# Patient Record
Sex: Male | Born: 1955 | Race: White | Hispanic: No | Marital: Married | Smoking: Former smoker
Health system: Southern US, Community
[De-identification: ages and names within clinical notes are randomized; demographics above are authoritative.]

## PROBLEM LIST (undated history)

## (undated) ENCOUNTER — Emergency Department (HOSPITAL_COMMUNITY): Discharge status: Absent without leave | Payer: BLUE CROSS/BLUE SHIELD

## (undated) DIAGNOSIS — I509 Heart failure, unspecified: Secondary | ICD-10-CM

## (undated) DIAGNOSIS — F99 Mental disorder, not otherwise specified: Secondary | ICD-10-CM

## (undated) DIAGNOSIS — I10 Essential (primary) hypertension: Secondary | ICD-10-CM

## (undated) DIAGNOSIS — J189 Pneumonia, unspecified organism: Secondary | ICD-10-CM

## (undated) DIAGNOSIS — F988 Other specified behavioral and emotional disorders with onset usually occurring in childhood and adolescence: Secondary | ICD-10-CM

## (undated) DIAGNOSIS — F319 Bipolar disorder, unspecified: Secondary | ICD-10-CM

## (undated) DIAGNOSIS — G4730 Sleep apnea, unspecified: Secondary | ICD-10-CM

## (undated) DIAGNOSIS — J449 Chronic obstructive pulmonary disease, unspecified: Secondary | ICD-10-CM

## (undated) DIAGNOSIS — F419 Anxiety disorder, unspecified: Secondary | ICD-10-CM

## (undated) DIAGNOSIS — M1990 Unspecified osteoarthritis, unspecified site: Secondary | ICD-10-CM

## (undated) DIAGNOSIS — F32A Depression, unspecified: Secondary | ICD-10-CM

## (undated) DIAGNOSIS — K219 Gastro-esophageal reflux disease without esophagitis: Secondary | ICD-10-CM

## (undated) DIAGNOSIS — R0602 Shortness of breath: Secondary | ICD-10-CM

## (undated) DIAGNOSIS — F1910 Other psychoactive substance abuse, uncomplicated: Secondary | ICD-10-CM

## (undated) HISTORY — PX: TONSILLECTOMY: SUR1361

## (undated) HISTORY — PX: COLONOSCOPY: SHX174

---

## 1986-02-16 HISTORY — PX: APPENDECTOMY: SHX54

## 1998-12-10 ENCOUNTER — Ambulatory Visit (HOSPITAL_COMMUNITY): Admission: RE | Admit: 1998-12-10 | Discharge: 1998-12-10 | Payer: Self-pay | Admitting: Gastroenterology

## 1999-05-20 ENCOUNTER — Emergency Department (HOSPITAL_COMMUNITY): Admission: EM | Admit: 1999-05-20 | Discharge: 1999-05-21 | Payer: Self-pay | Admitting: Emergency Medicine

## 1999-05-20 ENCOUNTER — Encounter: Payer: Self-pay | Admitting: Emergency Medicine

## 1999-08-11 ENCOUNTER — Emergency Department (HOSPITAL_COMMUNITY): Admission: EM | Admit: 1999-08-11 | Discharge: 1999-08-11 | Payer: Self-pay | Admitting: Emergency Medicine

## 1999-08-14 ENCOUNTER — Encounter (HOSPITAL_COMMUNITY): Admission: RE | Admit: 1999-08-14 | Discharge: 1999-11-12 | Payer: Self-pay | Admitting: Emergency Medicine

## 2011-07-28 ENCOUNTER — Encounter (HOSPITAL_COMMUNITY): Payer: Self-pay | Admitting: *Deleted

## 2011-07-28 ENCOUNTER — Encounter (HOSPITAL_COMMUNITY): Payer: Self-pay

## 2011-07-28 ENCOUNTER — Inpatient Hospital Stay (HOSPITAL_COMMUNITY)
Admission: AD | Admit: 2011-07-28 | Discharge: 2011-08-01 | DRG: 897 | Disposition: A | Discharge status: Home / Self Care | Payer: 59 | Source: Ambulatory Visit | Attending: Psychiatry | Admitting: Psychiatry

## 2011-07-28 ENCOUNTER — Emergency Department (HOSPITAL_COMMUNITY)
Admission: EM | Admit: 2011-07-28 | Discharge: 2011-07-28 | Disposition: A | Discharge status: Other Acute Inpatient Hospital | Payer: 59 | Attending: Emergency Medicine | Admitting: Emergency Medicine

## 2011-07-28 DIAGNOSIS — F1010 Alcohol abuse, uncomplicated: Secondary | ICD-10-CM | POA: Diagnosis present

## 2011-07-28 DIAGNOSIS — F3130 Bipolar disorder, current episode depressed, mild or moderate severity, unspecified: Secondary | ICD-10-CM

## 2011-07-28 DIAGNOSIS — F10239 Alcohol dependence with withdrawal, unspecified: Principal | ICD-10-CM | POA: Diagnosis present

## 2011-07-28 DIAGNOSIS — F1910 Other psychoactive substance abuse, uncomplicated: Secondary | ICD-10-CM | POA: Insufficient documentation

## 2011-07-28 DIAGNOSIS — F1510 Other stimulant abuse, uncomplicated: Secondary | ICD-10-CM | POA: Diagnosis present

## 2011-07-28 DIAGNOSIS — Z7982 Long term (current) use of aspirin: Secondary | ICD-10-CM

## 2011-07-28 DIAGNOSIS — F10939 Alcohol use, unspecified with withdrawal, unspecified: Principal | ICD-10-CM | POA: Diagnosis present

## 2011-07-28 DIAGNOSIS — F609 Personality disorder, unspecified: Secondary | ICD-10-CM | POA: Diagnosis present

## 2011-07-28 DIAGNOSIS — Z79899 Other long term (current) drug therapy: Secondary | ICD-10-CM

## 2011-07-28 DIAGNOSIS — F1020 Alcohol dependence, uncomplicated: Secondary | ICD-10-CM

## 2011-07-28 DIAGNOSIS — F3132 Bipolar disorder, current episode depressed, moderate: Secondary | ICD-10-CM | POA: Diagnosis present

## 2011-07-28 DIAGNOSIS — F411 Generalized anxiety disorder: Secondary | ICD-10-CM | POA: Diagnosis present

## 2011-07-28 DIAGNOSIS — F909 Attention-deficit hyperactivity disorder, unspecified type: Secondary | ICD-10-CM | POA: Diagnosis present

## 2011-07-28 HISTORY — DX: Other psychoactive substance abuse, uncomplicated: F19.10

## 2011-07-28 HISTORY — DX: Mental disorder, not otherwise specified: F99

## 2011-07-28 HISTORY — DX: Anxiety disorder, unspecified: F41.9

## 2011-07-28 LAB — DIFFERENTIAL
Basophils Relative: 1 % (ref 0–1)
Lymphs Abs: 2.2 10*3/uL (ref 0.7–4.0)
Monocytes Relative: 6 % (ref 3–12)
Neutro Abs: 3.7 10*3/uL (ref 1.7–7.7)
Neutrophils Relative %: 56 % (ref 43–77)

## 2011-07-28 LAB — URINALYSIS, ROUTINE W REFLEX MICROSCOPIC
Bilirubin Urine: NEGATIVE
Hgb urine dipstick: NEGATIVE
Ketones, ur: NEGATIVE mg/dL
Nitrite: NEGATIVE
pH: 7 (ref 5.0–8.0)

## 2011-07-28 LAB — CBC
Hemoglobin: 14.9 g/dL (ref 13.0–17.0)
RBC: 4.65 MIL/uL (ref 4.22–5.81)
WBC: 6.5 10*3/uL (ref 4.0–10.5)

## 2011-07-28 LAB — ETHANOL: Alcohol, Ethyl (B): <11 mg/dL (ref 0–11)

## 2011-07-28 LAB — COMPREHENSIVE METABOLIC PANEL
ALT: 17 U/L (ref 0–53)
Albumin: 4.1 g/dL (ref 3.5–5.2)
Alkaline Phosphatase: 108 U/L (ref 39–117)
BUN: 8 mg/dL (ref 6–23)
Chloride: 103 mEq/L (ref 96–112)
Glucose, Bld: 90 mg/dL (ref 70–99)
Potassium: 4 mEq/L (ref 3.5–5.1)
Sodium: 140 mEq/L (ref 135–145)
Total Bilirubin: 0.4 mg/dL (ref 0.3–1.2)

## 2011-07-28 LAB — RAPID URINE DRUG SCREEN, HOSP PERFORMED
Amphetamines: NOT DETECTED
Barbiturates: NOT DETECTED
Benzodiazepines: NOT DETECTED
Cocaine: NOT DETECTED
Tetrahydrocannabinol: NOT DETECTED

## 2011-07-28 LAB — ACETAMINOPHEN LEVEL: Acetaminophen (Tylenol), Serum: <15 ug/mL (ref 10–30)

## 2011-07-28 MED ORDER — LORAZEPAM 2 MG/ML IJ SOLN: 1.0000 mg | Freq: Four times a day (QID) | INTRAMUSCULAR | Status: DC | PRN

## 2011-07-28 MED ORDER — BUPROPION HCL ER (XL) 150 MG PO TB24
150.0000 mg | ORAL_TABLET | Freq: Every day | ORAL | Status: DC
  Administered 2011-07-28: 150 mg via ORAL
  Filled 2011-07-28: qty 1

## 2011-07-28 MED ORDER — LORAZEPAM 1 MG PO TABS: 1.0000 mg | ORAL_TABLET | Freq: Four times a day (QID) | ORAL | Status: DC | PRN

## 2011-07-28 MED ORDER — LOPERAMIDE HCL 2 MG PO CAPS: 2.0000 mg | ORAL_CAPSULE | ORAL | Status: AC | PRN

## 2011-07-28 MED ORDER — ACETAMINOPHEN 325 MG PO TABS: 650.0000 mg | ORAL_TABLET | Freq: Four times a day (QID) | ORAL | Status: DC | PRN

## 2011-07-28 MED ORDER — MAGNESIUM HYDROXIDE 400 MG/5ML PO SUSP: 30.0000 mL | Freq: Every day | ORAL | Status: DC | PRN

## 2011-07-28 MED ORDER — CHLORDIAZEPOXIDE HCL 25 MG PO CAPS
25.0000 mg | ORAL_CAPSULE | Freq: Once | ORAL | Status: AC
  Administered 2011-07-28: 25 mg via ORAL
  Filled 2011-07-28: qty 1

## 2011-07-28 MED ORDER — ALUM & MAG HYDROXIDE-SIMETH 200-200-20 MG/5ML PO SUSP: 30.0000 mL | ORAL | Status: DC | PRN

## 2011-07-28 MED ORDER — ONDANSETRON 4 MG PO TBDP: 4.0000 mg | ORAL_TABLET | Freq: Four times a day (QID) | ORAL | Status: AC | PRN

## 2011-07-28 MED ORDER — VITAMIN B-1 100 MG PO TABS
100.0000 mg | ORAL_TABLET | Freq: Every day | ORAL | Status: DC
  Filled 2011-07-28: qty 1

## 2011-07-28 MED ORDER — CHLORDIAZEPOXIDE HCL 25 MG PO CAPS
25.0000 mg | ORAL_CAPSULE | ORAL | Status: AC
  Administered 2011-07-31 (×2): 25 mg via ORAL
  Filled 2011-07-28 (×2): qty 1

## 2011-07-28 MED ORDER — CHLORDIAZEPOXIDE HCL 25 MG PO CAPS
25.0000 mg | ORAL_CAPSULE | Freq: Four times a day (QID) | ORAL | Status: AC | PRN
  Administered 2011-07-29: 25 mg via ORAL
  Filled 2011-07-28: qty 1

## 2011-07-28 MED ORDER — LORAZEPAM 1 MG PO TABS
2.0000 mg | ORAL_TABLET | Freq: Once | ORAL | Status: AC
  Administered 2011-07-28: 2 mg via ORAL
  Filled 2011-07-28: qty 1

## 2011-07-28 MED ORDER — LORAZEPAM 1 MG PO TABS
0.0000 mg | ORAL_TABLET | Freq: Four times a day (QID) | ORAL | Status: DC
  Administered 2011-07-28 (×2): 2 mg via ORAL
  Filled 2011-07-28 (×2): qty 2

## 2011-07-28 MED ORDER — CHLORDIAZEPOXIDE HCL 25 MG PO CAPS
25.0000 mg | ORAL_CAPSULE | Freq: Every day | ORAL | Status: AC
  Administered 2011-08-01: 25 mg via ORAL
  Filled 2011-07-28: qty 1

## 2011-07-28 MED ORDER — THIAMINE HCL 100 MG/ML IJ SOLN: 100.0000 mg | Freq: Every day | INTRAMUSCULAR | Status: DC

## 2011-07-28 MED ORDER — LAMOTRIGINE 150 MG PO TABS
150.0000 mg | ORAL_TABLET | Freq: Two times a day (BID) | ORAL | Status: DC
  Administered 2011-07-28: 150 mg via ORAL
  Filled 2011-07-28 (×2): qty 1

## 2011-07-28 MED ORDER — THIAMINE HCL 100 MG/ML IJ SOLN: 100.0000 mg | Freq: Once | INTRAMUSCULAR | Status: DC

## 2011-07-28 MED ORDER — VITAMIN B-1 100 MG PO TABS
100.0000 mg | ORAL_TABLET | Freq: Every day | ORAL | Status: DC
  Administered 2011-07-29 – 2011-08-01 (×4): 100 mg via ORAL
  Filled 2011-07-28 (×6): qty 1

## 2011-07-28 MED ORDER — ARIPIPRAZOLE 15 MG PO TABS
15.0000 mg | ORAL_TABLET | Freq: Every day | ORAL | Status: DC
  Administered 2011-07-28: 15 mg via ORAL
  Filled 2011-07-28 (×2): qty 1

## 2011-07-28 MED ORDER — CHLORDIAZEPOXIDE HCL 25 MG PO CAPS
25.0000 mg | ORAL_CAPSULE | Freq: Four times a day (QID) | ORAL | Status: AC
  Administered 2011-07-28 – 2011-07-29 (×4): 25 mg via ORAL
  Filled 2011-07-28 (×4): qty 1

## 2011-07-28 MED ORDER — FOLIC ACID 1 MG PO TABS
1.0000 mg | ORAL_TABLET | Freq: Every day | ORAL | Status: DC
  Administered 2011-07-28: 1 mg via ORAL
  Filled 2011-07-28: qty 1

## 2011-07-28 MED ORDER — ADULT MULTIVITAMIN W/MINERALS CH
1.0000 | ORAL_TABLET | Freq: Every day | ORAL | Status: DC
  Administered 2011-07-28: 1 via ORAL
  Filled 2011-07-28: qty 1

## 2011-07-28 MED ORDER — HYDROXYZINE HCL 25 MG PO TABS
25.0000 mg | ORAL_TABLET | Freq: Four times a day (QID) | ORAL | Status: AC | PRN
  Administered 2011-07-29 – 2011-07-30 (×3): 25 mg via ORAL
  Filled 2011-07-28: qty 1

## 2011-07-28 MED ORDER — ASPIRIN EC 81 MG PO TBEC
81.0000 mg | DELAYED_RELEASE_TABLET | Freq: Every day | ORAL | Status: DC
Start: 2011-07-28 — End: 2011-07-28
  Administered 2011-07-28: 81 mg via ORAL
  Filled 2011-07-28: qty 1

## 2011-07-28 MED ORDER — ADULT MULTIVITAMIN W/MINERALS CH
1.0000 | ORAL_TABLET | Freq: Every day | ORAL | Status: DC
  Administered 2011-07-29 – 2011-08-01 (×4): 1 via ORAL
  Filled 2011-07-28 (×6): qty 1

## 2011-07-28 MED ORDER — CHLORDIAZEPOXIDE HCL 25 MG PO CAPS
25.0000 mg | ORAL_CAPSULE | Freq: Three times a day (TID) | ORAL | Status: AC
  Administered 2011-07-30 (×3): 25 mg via ORAL
  Filled 2011-07-28 (×3): qty 1

## 2011-07-28 MED ORDER — TRAZODONE HCL 100 MG PO TABS
100.0000 mg | ORAL_TABLET | Freq: Every evening | ORAL | Status: DC | PRN
  Administered 2011-07-28 – 2011-07-31 (×4): 100 mg via ORAL
  Filled 2011-07-28 (×4): qty 1

## 2011-07-28 MED ORDER — LORAZEPAM 1 MG PO TABS
0.0000 mg | ORAL_TABLET | Freq: Two times a day (BID) | ORAL | Status: DC
  Administered 2011-07-28: 2 mg via ORAL

## 2011-07-28 NOTE — Tx Team (Signed)
Initial Interdisciplinary Treatment Plan  PATIENT STRENGTHS: (choose at least two) Ability for insight Capable of independent living Communication skills Financial means General fund of knowledge Supportive family/friends  PATIENT STRESSORS: Marital or family conflict Occupational concerns Substance abuse   PROBLEM LIST: Problem List/Patient Goals Date to be addressed Date deferred Reason deferred Estimated date of resolution  anxiety      Alcohol abuse                                                 DISCHARGE CRITERIA:  Improved stabilization in mood, thinking, and/or behavior Motivation to continue treatment in a less acute level of care Need for constant or close observation no longer present Verbal commitment to aftercare and medication compliance  PRELIMINARY DISCHARGE PLAN: Attend aftercare/continuing care group Attend 12-step recovery group  PATIENT/FAMIILY INVOLVEMENT: This treatment plan has been presented to and reviewed with the patient, [name], and/or family member, .  The patient and family have been given the opportunity to ask questions and make suggestions.  Noah Charon 07/28/2011, 9:58 PM

## 2011-07-28 NOTE — ED Notes (Signed)
MD at bedside. 

## 2011-07-28 NOTE — ED Notes (Signed)
MD informed of CIWA score/BP-ok to give another 2mg  of Ativan and reassess in 30-45 minutes-will continue to monitor

## 2011-07-28 NOTE — ED Notes (Signed)
Pt is dressed in blue scrubs

## 2011-07-28 NOTE — ED Provider Notes (Signed)
History     CSN: [id number]  Arrival date & time 07/28/11  0725   First MD Initiated Contact with Patient 07/28/11 [phone]      Chief Complaint  Patient presents with  . Medical Clearance    detox     The history is provided by the patient and the spouse.   patient reports a long-standing history stimulant use of his ADHD meds.  He reports is also been drinking alcohol for the last several months in a way to cope with the increased stimulant effects of his medications and his substance abuse.  He reports a long-standing history of alcohol abuse with alcohol detox in 1990s.  He's been sober for a very long time until last several months.  His last drink was last night.  He's been drinking Dr.  Denies homicidal or suicidal thoughts.  He has no other medical complaints.  His symptoms are mild to moderate.  He states he feels slightly anxious at this time.  At times he says "I'm freaking out"  Past Medical History  Diagnosis Date  . Substance abuse     Past Surgical History  Procedure Date  . Appendectomy     No family history on file.  History  Substance Use Topics  . Smoking status: Never Smoker   . Smokeless tobacco: Not on file  . Alcohol Use: Yes     pint a day      Review of Systems  All other systems reviewed and are negative.    Allergies  Review of patient's allergies indicates no known allergies.  Home Medications   Current Outpatient Rx  Name Route Sig Dispense Refill  . ARIPIPRAZOLE 15 MG PO TABS Oral Take 15 mg by mouth daily.    . ASPIRIN EC 81 MG PO TBEC Oral Take 81 mg by mouth daily.    . BUPROPION HCL ER (XL) 150 MG PO TB24 Oral Take 150 mg by mouth daily.    Marland Kitchen GABAPENTIN 100 MG PO CAPS Oral Take 200 mg by mouth daily as needed.    Marland Kitchen LAMOTRIGINE 150 MG PO TABS Oral Take 150 mg by mouth 2 (two) times daily.    . METHYLPHENIDATE HCL ER 54 MG PO TBCR Oral Take 54 mg by mouth every morning.      BP 139/106  Pulse 93  Temp(Src) 97.5 F (36.4 C)  (Oral)  Resp 18  SpO2 97%  Physical Exam  Nursing note and vitals reviewed. Constitutional: He is oriented to person, place, and time. He appears well-developed and well-nourished.  HENT:  Head: Normocephalic and atraumatic.  Eyes: EOM are normal.  Neck: Normal range of motion.  Cardiovascular: Normal rate, regular rhythm, normal heart sounds and intact distal pulses.   Pulmonary/Chest: Effort normal and breath sounds normal. No respiratory distress.  Abdominal: Soft. He exhibits no distension. There is no tenderness.  Musculoskeletal: Normal range of motion.  Neurological: He is alert and oriented to person, place, and time.  Skin: Skin is warm and dry.  Psychiatric: His speech is normal. Judgment and thought content normal. Thought content is not paranoid. Cognition and memory are normal. He exhibits a depressed mood. He expresses no homicidal and no suicidal ideation.    ED Course  Procedures (including critical care time)   Labs Reviewed  CBC  DIFFERENTIAL  URINALYSIS, ROUTINE W REFLEX MICROSCOPIC  ETHANOL  COMPREHENSIVE METABOLIC PANEL  ACETAMINOPHEN LEVEL  URINE RAPID DRUG SCREEN (HOSP PERFORMED)   No results found.  1. ETOH abuse 2. Stimulant Abuse   MDM  Spoke with [facility] who will evaluate. No ADHD meds in ER secondary to abuse. Placed on home meds. ETOH withdrawal protocol        Lyanne Co, MD 07/28/11 [phone]

## 2011-07-28 NOTE — ED Provider Notes (Signed)
BP 156/93  Pulse 84  Temp(Src) 98.1 F (36.7 C) (Oral)  Resp 14  SpO2 98%  Accepted for transfer [facility].  Forbes Cellar, MD 07/28/11 1944

## 2011-07-28 NOTE — ED Notes (Signed)
Pt denies si/hi. Pt asked wife to step out room while answering questions. Pt states he can answer question better without spouse

## 2011-07-28 NOTE — ED Notes (Signed)
Pt states he has a lot of guilt related ETOH abuse-states he has been dealing with issue since he was a child-was sober for 20 years-drinking daily for 3 months or more-has not been involved in AA "in years"-increased stress at work and health of wife resulted in relapse-pt anxious, tearful at times-will continue to monitor

## 2011-07-28 NOTE — Consult Note (Signed)
Reason for Consult: Bipolar disorder, Depression, alcohol abuse, benzodiazepines abuse, methylphenidate abuse Referring Physician: Dr. Lisbeth Renshaw is an 56 y.o. male.  HPI: Patient was seen and chart reviewed. Patient was brought in by his family for mental decompensation with the symptoms of for depression, isolation, withdrawal, decreased the social functioning, unable to function in his work and drinking. The alcohol, probably walk up 1/5 daily in the evening. For the last 2-6 months, possibility of for snorting, and clonazepam and methylphenidate. Patient has history of for alcohol dependence and was sober since 65. Patient reportedly has the rough childhood. Patient has a 2 children, ages 66 and 8 who are in college. 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 Patient reported he used to have a poor work as for him but now only has 3 workers. Patient has been receiving outpatient psychiatric services from Valinda Hoar, nurse practitioner at [facility]. Reportedly patient has been changed psychiatric clinic. Patient urine drug screen was negative for drug of abuse is a blood alcohol level was not significant. Patient has a limited insight, judgment and impulse control.  Past Medical History  Diagnosis Date  . Substance abuse   . Anxiety     Past Surgical History  Procedure Date  . Appendectomy     No family history on file.  Social History:  reports that he has never smoked. He does not have any smokeless tobacco history on file. He reports that he drinks alcohol. He reports that he does not use illicit drugs.  Allergies: No Known Allergies  Medications: I have reviewed the patient's current medications.  Results for orders placed during the hospital encounter of 07/28/11 (from the past 48 hour(s))  ETHANOL     Status:  Normal   Collection Time   07/28/11  7:51 AM      Component Value Range Comment   Alcohol, Ethyl (B) <11  0 - 11 (mg/dL)   CBC     Status: Normal   Collection Time   07/28/11  7:51 AM      Component Value Range Comment   WBC 6.5  4.0 - 10.5 (K/uL)    RBC 4.65  4.22 - 5.81 (MIL/uL)    Hemoglobin 14.9  13.0 - 17.0 (g/dL)    HCT 96.0  45.4 - 09.8 (%)    MCV 91.8  78.0 - 100.0 (fL)    MCH 32.0  26.0 - 34.0 (pg)    MCHC 34.9  30.0 - 36.0 (g/dL)    RDW 11.9  14.7 - 82.9 (%)    Platelets 227  150 - 400 (K/uL)   DIFFERENTIAL     Status: Normal   Collection Time   07/28/11  7:51 AM      Component Value Range Comment   Neutrophils Relative 56  43 - 77 (%)    Neutro Abs 3.7  1.7 - 7.7 (K/uL)    Lymphocytes Relative 34  12 - 46 (%)    Lymphs Abs 2.2  0.7 - 4.0 (K/uL)    Monocytes Relative 6  3 - 12 (%)    Monocytes Absolute 0.4  0.1 - 1.0 (K/uL)    Eosinophils Relative 3  0 - 5 (%)    Eosinophils Absolute 0.2  0.0 - 0.7 (K/uL)    Basophils Relative 1  0 - 1 (%)  Basophils Absolute 0.1  0.0 - 0.1 (K/uL)   COMPREHENSIVE METABOLIC PANEL     Status: Normal   Collection Time   07/28/11  7:51 AM      Component Value Range Comment   Sodium 140  135 - 145 (mEq/L)    Potassium 4.0  3.5 - 5.1 (mEq/L)    Chloride 103  96 - 112 (mEq/L)    CO2 27  19 - 32 (mEq/L)    Glucose, Bld 90  70 - 99 (mg/dL)    BUN 8  6 - 23 (mg/dL)    Creatinine, Ser 9.14  0.50 - 1.35 (mg/dL)    Calcium 9.1  8.4 - 10.5 (mg/dL)    Total Protein 7.0  6.0 - 8.3 (g/dL)    Albumin 4.1  3.5 - 5.2 (g/dL)    AST 19  0 - 37 (U/L)    ALT 17  0 - 53 (U/L)    Alkaline Phosphatase 108  39 - 117 (U/L)    Total Bilirubin 0.4  0.3 - 1.2 (mg/dL)    GFR calc non Af Amer >90  >90 (mL/min)    GFR calc Af Amer >90  >90 (mL/min)   ACETAMINOPHEN LEVEL     Status: Normal   Collection Time   07/28/11  7:51 AM      Component Value Range Comment   Acetaminophen (Tylenol), Serum <15.0  10 - 30 (ug/mL)   URINALYSIS, ROUTINE W REFLEX  MICROSCOPIC     Status: Normal   Collection Time   07/28/11  8:06 AM      Component Value Range Comment   Color, Urine YELLOW  YELLOW     APPearance CLEAR  CLEAR     Specific Gravity, Urine 1.017  1.005 - 1.030     pH 7.0  5.0 - 8.0     Glucose, UA NEGATIVE  NEGATIVE (mg/dL)    Hgb urine dipstick NEGATIVE  NEGATIVE     Bilirubin Urine NEGATIVE  NEGATIVE     Ketones, ur NEGATIVE  NEGATIVE (mg/dL)    Protein, ur NEGATIVE  NEGATIVE (mg/dL)    Urobilinogen, UA 1.0  0.0 - 1.0 (mg/dL)    Nitrite NEGATIVE  NEGATIVE     Leukocytes, UA NEGATIVE  NEGATIVE  MICROSCOPIC NOT DONE ON URINES WITH NEGATIVE PROTEIN, BLOOD, LEUKOCYTES, NITRITE, OR GLUCOSE <1000 mg/dL.  URINE RAPID DRUG SCREEN (HOSP PERFORMED)     Status: Normal   Collection Time   07/28/11  8:06 AM      Component Value Range Comment   Opiates NONE DETECTED  NONE DETECTED     Cocaine NONE DETECTED  NONE DETECTED     Benzodiazepines NONE DETECTED  NONE DETECTED     Amphetamines NONE DETECTED  NONE DETECTED     Tetrahydrocannabinol NONE DETECTED  NONE DETECTED     Barbiturates NONE DETECTED  NONE DETECTED      No results found.  No psychosis and Positive for anxiety, bad mood, depression, excessive alcohol consumption, illegal drug usage and sleep disturbance Blood pressure 147/94, pulse 101, temperature 98.1 F (36.7 C), temperature source Oral, resp. rate 14, SpO2 97.00%.   Assessment/Plan: Bipolar disorder, most recent episode depression Attention deficit hyperactivity disorder. Alcohol dependence and recent relapse with alcohol abuse. Rule out polysubstance abuse including clonazepam and the methylphenidate.  Recommended acute psychiatric hospitalization for safety and stabilization and possibly on medication for bipolar disorder and the alcohol detox treatment.  Cleatus Goodin,JANARDHAHA R. 07/28/2011, 5:36 PM

## 2011-07-28 NOTE — ED Notes (Signed)
Pt states he want detox of alcohol. Pt states he drinks about a pint of vocka. Pt states he has also been taking his concerta for his adhd. Pt states his last drink was last night. Pt states he has been sober and relapsed recently

## 2011-07-28 NOTE — BH Assessment (Signed)
Assessment Note   [name] is an 56 y.o. male. Patient presents to [facility].  He reports drinking alcohol for the last 6-7 months in a way to cope with the increased stimulant effects of his medications (referring to ADHD medications) and his substance abuse. He reports a long-standing history of stimulant use due to taking ADHD meds. He denies overuse of medications stating he only takes as prescribed. His last drink was last night . He's been sober since 1989 relapsing 6-7 months ago. He reports a long-standing history of alcohol abuse sobriety. He [facility] detox at [facility] in 1989. He reports an increase in anxiety and depression triggered by his relapse 6-7 months ago. Patient reports tremors and anxiety as his only withdrawal symptom.  Denies suicidal and homicidal thoughts. No psychotic symptoms noted. He has no other medical complaints. His symptoms are mild to moderate.    Axis I: Alcohol Dependence; Anxiety Disorder Nos, Mood Disorder Nos, ADHD Axis II: Deferred Axis III:  Past Medical History  Diagnosis Date  . Substance abuse   . Anxiety    Axis IV: other psychosocial or environmental problems and problems related to social environment Axis V: 41-50 serious symptoms  Past Medical History:  Past Medical History  Diagnosis Date  . Substance abuse   . Anxiety     Past Surgical History  Procedure Date  . Appendectomy     Family History: No family history on file.  Social History:  reports that he has never smoked. He does not have any smokeless tobacco history on file. He reports that he drinks alcohol. He reports that he does not use illicit drugs.  Additional Social History:  Alcohol / Drug Use Pain Medications: see listed meds noted by [facility] staff  Prescriptions: see listed meds noted by [facility] staff Over the Counter: see listed meds noted by [facility] staff History of alcohol / drug use?: Yes Substance #1 Name of Substance 1:  Alcohol (Vodka) 1 - Age of First Use: teens 1 - Amount (size/oz): "a little more than a pint" 1 - Frequency: daily for the past 6-7 months 1 - Duration: on-going x6-7 months 1 - Last Use / Amount: 07/27/2011 "last night";sts he drank 1 bottle of vodka   CIWA: CIWA-Ar BP: 147/94 mmHg Pulse Rate: 101  Nausea and Vomiting: mild nausea with no vomiting Tactile Disturbances: very mild itching, pins and needles, burning or numbness Tremor: not visible, but can be felt fingertip to fingertip Auditory Disturbances: not present Paroxysmal Sweats: barely perceptible sweating, palms moist Visual Disturbances: not present Anxiety: three Headache, Fullness in Head: mild Agitation: two Orientation and Clouding of Sensorium: oriented and can do serial additions CIWA-Ar Total: 13  COWS:    Allergies: No Known Allergies  Home Medications:  (Not in a hospital admission)  OB/GYN Status:  No LMP for male patient.  General Assessment Data Location of Assessment: [facility] Living Arrangements: Other (Comment);Spouse/significant other (lives with spouse and adult son ) Can pt return to current living arrangement?: Yes Admission Status: Voluntary Is patient capable of signing voluntary admission?: Yes Transfer from: Acute Hospital Referral Source: Self/Family/Friend  Education Status Is patient currently in school?: No  Risk to self Suicidal Ideation: No Suicidal Intent: No Is patient at risk for suicide?: No Suicidal Plan?: No Access to Means: No Specify Access to Suicidal Means:  (n/a; no acces to means) What has been your use of drugs/alcohol within the last 12 months?:  (Alcohol) Previous  Attempts/Gestures: No How many times?:  (0) Other Self Harm Risks:  (n/a) Triggers for Past Attempts: Other (Comment) (no previous attempts ) Intentional Self Injurious Behavior: None Family Suicide History: Yes (Brother, Sister, Midwife. Family members-drug) Recent stressful life  event(s): Other (Comment) (increase in anxiety; relapsed on alcohol 6-7 mo's ago) Persecutory voices/beliefs?: No Depression: Yes Depression Symptoms: Loss of interest in usual pleasures;Feeling angry/irritable;Despondent Substance abuse history and/or treatment for substance abuse?: Yes Suicide prevention information given to non-admitted patients: Not applicable  Risk to Others Homicidal Ideation: No Thoughts of Harm to Others: No Current Homicidal Intent: No Current Homicidal Plan: No Access to Homicidal Means: No Identified Victim:  (n/a) History of harm to others?: No Assessment of Violence: None Noted Violent Behavior Description:  (pt currently calm and cooperative) Does patient have access to weapons?: No Criminal Charges Pending?: No Does patient have a court date: No  Psychosis Hallucinations: None noted Delusions: None noted  Mental Status Report Appear/Hygiene: Other (Comment) Eye Contact: Fair Motor Activity: Agitation Speech: Logical/coherent Level of Consciousness: Alert Mood: Depressed;Anxious Affect: Appropriate to circumstance Anxiety Level: Panic Attacks Panic attack frequency:  (daily) Most recent panic attack:  (07/28/2011; sts he had a panic attack this morning)  Cognitive Functioning Concentration: Normal Memory: Recent Intact;Remote Intact IQ: Average Insight: Fair Impulse Control: Fair Appetite: Good Weight Loss:  (0) Weight Gain:  (0) Sleep: Decreased Total Hours of Sleep:  (approx. 6hrs)  ADLScreening [facility] Assessment Services) Patient's cognitive ability adequate to safely complete daily activities?: Yes Patient able to express need for assistance with ADLs?: Yes Independently performs ADLs?: Yes  Abuse/Neglect [facility]) Physical Abuse: Denies Verbal Abuse: Denies Sexual Abuse: Denies  Prior Inpatient Therapy Prior Inpatient Therapy: Yes Prior Therapy Dates:  (1989) Prior Therapy Facilty/Provider(s):  [facility]) Reason for Treatment:   (substance abuse (alcohol and thc))  Prior Outpatient Therapy Prior Outpatient Therapy: No Prior Therapy Dates:  (n/a) Prior Therapy Facilty/Provider(s):  (n/a) Reason for Treatment:  (n/a)  ADL Screening (condition at time of admission) Patient's cognitive ability adequate to safely complete daily activities?: Yes Patient able to express need for assistance with ADLs?: Yes Independently performs ADLs?: Yes Weakness of Legs: None Weakness of Arms/Hands: None  Home Assistive Devices/Equipment Home Assistive Devices/Equipment: None    Abuse/Neglect Assessment (Assessment to be complete while patient is alone) Physical Abuse: Denies Verbal Abuse: Denies Sexual Abuse: Denies Exploitation of patient/patient's resources: Denies Self-Neglect: Denies Values / Beliefs Cultural Requests During Hospitalization: None Spiritual Requests During Hospitalization: None   Advance Directives (For Healthcare) Advance Directive: Patient does not have advance directive Nutrition Screen Diet: Regular Unintentional weight loss greater than 10lbs within the last month: No Problems chewing or swallowing foods and/or liquids: No Home Tube Feeding or Total Parenteral Nutrition (TPN): No Patient appears severely malnourished: No  Additional Information 1:1 In Past 12 Months?: No CIRT Risk: No Elopement Risk: No Does patient have medical clearance?: Yes     Disposition:  Disposition Disposition of Patient: Inpatient treatment program;Referred to (Patient requesting in-pt detox @ [facility]. Referred to [facility]. )  On Site Evaluation by:   Reviewed with Physician:     Melynda Ripple [facility] 07/28/2011 3:14 PM

## 2011-07-29 DIAGNOSIS — F1010 Alcohol abuse, uncomplicated: Secondary | ICD-10-CM | POA: Diagnosis present

## 2011-07-29 DIAGNOSIS — F3132 Bipolar disorder, current episode depressed, moderate: Secondary | ICD-10-CM | POA: Diagnosis present

## 2011-07-29 DIAGNOSIS — F1020 Alcohol dependence, uncomplicated: Secondary | ICD-10-CM | POA: Diagnosis present

## 2011-07-29 MED ORDER — ARIPIPRAZOLE 15 MG PO TABS
15.0000 mg | ORAL_TABLET | Freq: Every day | ORAL | Status: DC
  Administered 2011-07-29 – 2011-08-01 (×4): 15 mg via ORAL
  Filled 2011-07-29 (×7): qty 1

## 2011-07-29 MED ORDER — BUPROPION HCL ER (XL) 150 MG PO TB24
150.0000 mg | ORAL_TABLET | Freq: Every day | ORAL | Status: DC
  Administered 2011-07-29 – 2011-07-30 (×2): 150 mg via ORAL
  Filled 2011-07-29 (×4): qty 1

## 2011-07-29 MED ORDER — GABAPENTIN 100 MG PO CAPS: 200.0000 mg | ORAL_CAPSULE | Freq: Every day | ORAL | Status: DC

## 2011-07-29 MED ORDER — ASPIRIN EC 81 MG PO TBEC
81.0000 mg | DELAYED_RELEASE_TABLET | Freq: Every day | ORAL | Status: DC
  Administered 2011-07-29 – 2011-08-01 (×4): 81 mg via ORAL
  Filled 2011-07-29 (×6): qty 1

## 2011-07-29 MED ORDER — LAMOTRIGINE 100 MG PO TABS
150.0000 mg | ORAL_TABLET | Freq: Two times a day (BID) | ORAL | Status: DC
  Administered 2011-07-29 – 2011-08-01 (×7): 150 mg via ORAL
  Filled 2011-07-29 (×11): qty 1.5

## 2011-07-29 NOTE — Progress Notes (Signed)
Patient ID: SEVEN DOLLENS, male   DOB: 27-Nov-1955, 56 y.o.   MRN: [id number] He has been up and to most of the groups has poor interaction. "York Spaniel that he has panic attacks when around people"  He denies having withdrawal symptoms. Was given PRN for anxiety this Pm,
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
## 2011-07-29 NOTE — Progress Notes (Signed)
Patient ID: [name], male   DOB: 1955/09/05, 56 y.o.   MRN: [id number] Patient depressed and blunted tonight.  Patient complains of panic attacks which prn medication was given to help with anxiety.  Patient denies SI/HI and AVH.  When patient speaks about why he is at [facility] he becomes tearful.  Patient rates depression high at 7/10.  Patient offered encouragement and support.  Staff to monitor Q12mins for safety.

## 2011-07-29 NOTE — Progress Notes (Signed)
Patient pleasant and cooperative during admission. Patient states "I am bipolar and feeling increasingly depressed." Patient admits he has been abusing alcohol for approx 6 months. Patient states he drinks wine, beer and "all types of alcohol." Patient not forthcoming with staff about type/amount of alcohol used per day. Patient denies drug use. Patient denies tobacco use. 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 Patient denies SI/HI, denies A/V hallucinations. Patient endorses feeling anxious upon admission. Patient oriented to unit, oriented to unit policies. Patient denies any questions/concerns at this time. Patient safe on unit with Q15 minute checks for safety. Will continue to monitor.

## 2011-07-29 NOTE — Progress Notes (Signed)
[facility] Group Notes:  (Counselor/Nursing/MHT/Case Management/Adjunct)  07/29/2011 5:39 PM  Type of Therapy:  Group Therapy at 11  Participation Level:  Minimal  Participation Quality:  Attentive and Sharing  Affect:  Depressed and tearful  Cognitive:  Appropriate  Insight:  Good  Engagement in Group:  Good  Engagement in Therapy:  Limited  Modes of Intervention:  Clarification, Orientation, Socialization and Support  Summary of Progress/Problems:  The focus of this group session was to process how we deal with difficult emotions and share with others the patterns that play out when we are reacting to the emotion verses the situation.  [name] shared that the most difficult emotions he deals with are stress and depression.  He also shared with group that depressive episode and relapse on alcohol contributed to his admission.  [name] was also out of group for a period to meet with physician. [name] quoted Engineer, technical sales "Unhappy families are unhappy in similar ways but happy families are happy in many different ways."     [name] 07/29/2011, 5:39 PM  [facility] Group Notes:  (Counselor/Nursing/MHT/Case Management/Adjunct)  07/29/2011 5:42 PM  Type of Therapy:  Group Therapy at 1:15  Participation Level:  Minimal  Participation Quality:  Drowsy  Affect:  Depressed  Cognitive:  Oriented  Insight:  Limited sharing as he was asleep for portion of group  Engagement in Group:  Limited  Engagement in Therapy:  Limited  Modes of Intervention:  Limit-setting, Orientation, Support and Exploration  Summary of Progress/Problems: Group focus was on mindfulness and behaviors that promote mindfulness, including brief activity with Hershey candy. Members were given a Hershey kiss as they came into group and after discussion were led to eat another in experiential process.  [name] was awake for both portions of candy exercise yet was asleep during much of discussion.  [name] was responsive to  being awoken and was open to some teasing for snoring.  He shared in closing that he is looking forward to spending time with his children and exercising as he recently put on some weight and rest of family is very fit.      [name] 07/29/2011, 5:42 PM

## 2011-07-29 NOTE — Discharge Planning (Signed)
New patient attended AM group.  States he is here for depression and alcohol abuse.  Sober for 20 years or so and relapsed in the last year.  Factors of depression include business doing poorly and adult children leaving home.  Choked up several times as he was talking, but able to regain composure.  States he has been diagnosed with bipolar d/o as is taking meds for such.  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  She is supportive, worried and wants him to get the help he needs.
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
## 2011-07-30 MED ORDER — BUPROPION HCL ER (XL) 300 MG PO TB24
300.0000 mg | ORAL_TABLET | Freq: Every day | ORAL | Status: DC
Start: 2011-07-31 — End: 2011-08-01
  Administered 2011-07-31 – 2011-08-01 (×2): 300 mg via ORAL
  Filled 2011-07-30 (×5): qty 1

## 2011-07-30 NOTE — Progress Notes (Signed)
[facility] Group Notes:  (Counselor/Nursing/MHT/Case Management/Adjunct)  07/30/2011 11:15 PM  Type of Therapy:  Group Therapy at 11  Participation Level:  Active  Participation Quality:  Appropriate  Affect:  Depressed and Flat and tearful  Cognitive:  Alert and Oriented  Insight:  Limited  Engagement in Group:  Good  Engagement in Therapy:  Limited  Modes of Intervention:  Clarification and Support  Summary of Progress/Problems:  Focus of group processing discussion was on balance in life; the components in life which have a negative influence on balance and the components which make for a more balanced life. [name] shared  stressors which included "not living up to others expectations, providing financially for entire family and mental health issues." [name] also mentioned briefly "having work and having purpose" as a positive component to a balanced life. [name] continues to have periods of tearfulness during group.   [facility] Group Notes:  (Counselor/Nursing/MHT/Case Management/Adjunct)  07/30/2011 11:24 PM  Type of Therapy:  Group Therapy at 1:15  Participation Level:  Active  Participation Quality:  Appropriate, Attentive and Sharing  Affect:  Depressed and Flat  Cognitive:  Appropriate  Insight:  Good  Engagement in Group:  Good  Engagement in Therapy:  Good  Modes of Intervention:  Clarification and Support  Summary of Progress/Problems: participated in activity in which patients choose photographs to represent what their life would look and feel like were it in balance and another for out of balance. [name] chose a photograph of a statue covered in cobwebs to represent what it feels like when my depression takes over and a photo of children outside running with balloons to represent balance.  "These children represent an innate joy and innocence that we lose as adults." When asked where [name] might find some joy he shared about his children and how he misses them as they have either  moved out on their own or are attending college. He became tearful after sharing.    Clide Dales 07/30/2011, 11:24 PM

## 2011-07-30 NOTE — Progress Notes (Signed)
Patient ID: [name], male   DOB: 1955-05-21, 56 y.o.   MRN: [id number] Pt is awake and active on the unit this AM. Pt denies SI/HI and A/V hallucinations. Pt is participating in the milieu and is cooperative with staff. Pt forwards little in conversation but is attending groups. Pt expressed concern about medication regimen stating that he was abusing his Concerta. MD addressed this with the pt and made adjustments as needed. Pt is satisfied with those changes. Writer will continue to monitor.

## 2011-07-30 NOTE — Treatment Plan (Signed)
Interdisciplinary Treatment Plan Update (Adult)  Date: 07/30/2011  Time Reviewed: 9:28 AM   Progress in Treatment: Attending groups: Yes Participating in groups: Yes Taking medication as prescribed: Yes Tolerating medication: Yes   Family/Significant other contact made:  Yes Patient understands diagnosis:  Yes  As evidenced by asking for help with substance abuse, depression Discussing patient identified problems/goals with staff:  Yes  See below Medical problems stabilized or resolved:  Yes Denies suicidal/homicidal ideation: Yes In tx team Issues/concerns per patient self-inventory:  Yes  Depression 4, hopelessness 5  Panic attacks Other:  New problem(s) identified: N/A  Reason for Continuation of Hospitalization: Depression Medication stabilization Withdrawal symptoms  Interventions implemented related to continuation of hospitalization: Increase wellbutrin, librium taper, encourage group attendance and participation  Additional comments:  Estimated length of stay: 1-2 days  Discharge Plan: Return home, follow up outpt  New goal(s): N/A  Review of initial/current patient goals per problem list:   1.  Goal(s): Safely detox from alcohol  Met:  No  Target date:6/15  As evidenced [id number] vitals, no withdrawal symptoms  2.  Goal (s): Decrease depression  Met:  No  Target date:6/15  As evidenced [id number] will rate depression at 3 or less  3.  Goal(s): Identify comprehensive sobriety plan  Met:  No  Target date:6/15  As evidenced by: self report  4.  Goal(s):  Met:  No  Target date:  As evidenced by:  Attendees: Patient:  [name] 07/30/2011 9:28 AM  Family:     Physician:  Lupe Carney 07/30/2011 9:28 AM   Nursing:  Angela Cox Dax  07/30/2011 9:28 AM   Case Manager:  Richelle Ito, LCSW 07/30/2011 9:28 AM   Counselor:  Ronda Fairly, LCSWA 07/30/2011 9:28 AM   Other:     Other:     Other:     Other:      Scribe for Treatment Team:     Ida Rogue, 07/30/2011 9:28 AM

## 2011-07-30 NOTE — Progress Notes (Signed)
Pt. Has a flat affect, depressed mood.  Pt. Attended Karaoke. Reviewed medications with pt.  Support given

## 2011-07-30 NOTE — Progress Notes (Signed)
[facility] MD Progress Note  07/30/2011 4:24 PM  S/O: Patient seen and evaluated. Chart reviewed. Patient stated that his mood was "little better". His affect was mood congruent and brighter. He denied any current thoughts of self injurious behavior, suicidal ideation or homicidal ideation. There were no auditory or visual hallucinations, paranoia, delusional thought processes, or mania noted. Thought process was linear and goal directed. No psychomotor agitation or retardation was noted. His speech was normal rate, tone and volume. Eye contact was good. Judgment and insight are fair. Patient has been up and engaged on the unit. No acute safety concerns reported from team. Reported continued s/s depression and new willingness to not continue Concerta which he was abusing.  Sleep:  Number of Hours: 6.75    Vital Signs:Blood pressure 122/76, pulse 104, temperature 97.6 F (36.4 C), temperature source Oral, resp. rate 18, height 5\' 9"  (1.753 m), weight 112.038 kg (247 lb).  Current Medications:    . ARIPiprazole  15 mg Oral Daily  . aspirin EC  81 mg Oral Daily  . buPROPion  300 mg Oral Daily  . chlordiazePOXIDE  25 mg Oral QID   Followed by  . chlordiazePOXIDE  25 mg Oral TID   Followed by  . chlordiazePOXIDE  25 mg Oral BH-qamhs   Followed by  . chlordiazePOXIDE  25 mg Oral Daily  . lamoTRIgine  150 mg Oral BID  . multivitamin with minerals  1 tablet Oral Daily  . thiamine  100 mg Intramuscular Once  . thiamine  100 mg Oral Daily  . DISCONTD: buPROPion  150 mg Oral Daily    Lab Results: No results found for this or any previous visit (from the past 48 hour(s)).  Physical Findings: CIWA:  CIWA-Ar Total: 4   CLINICAL FACTORS: Alcohol Dependence & W/D; Stimulant Dependence; BPAD and ADHD, per Hx; r/o PTSD and PD NOS with Borderline Traits   Plan: Increased bupropion for better control of reported depressive s/s.  Medication education completed.  Pros, cons, risks, potential side effects and  benefits (including no treatment) were discussed with pt.  Pt agreeable with the plan.  See orders.  Discussed with team.  No hx seizures.   Lupe Carney 07/30/2011, 4:24 PM

## 2011-07-30 NOTE — Progress Notes (Signed)
07/30/2011         Time: 1415      Group Topic/Focus: The focus of this group is on enhancing the patient's understanding of leisure, barriers to leisure, and the importance of engaging in positive leisure activities upon discharge for improved total health.   Participation Level: Active  Participation Quality: Appropriate and Attentive  Affect: Appropriate  Cognitive: Oriented   Additional Comments: Patient able to identify positive leisure activities to engage in upon discharge.   [name] 07/30/2011 4:09 PM 

## 2011-07-31 NOTE — Progress Notes (Signed)
Psychoeducational Group Note  Date:  07/31/2011 Time:  1015  Group Topic/Focus:  Relapse Prevention Planning:   The focus of this group is to define relapse and discuss the need for planning to combat relapse.  Participation Level:  Active  Participation Quality:  Appropriate, Attentive and Sharing  Affect:  Appropriate  Cognitive:  Alert  Insight:  Good  Engagement in Group:  Good  Additional Comments:  Pt nodded throughout group and he appeared to be interested in what the other pts had to say.  Dalia Heading 07/31/2011, 1:51 PM

## 2011-07-31 NOTE — Progress Notes (Signed)
Pt. Went outside this pm . Stated he was feeling better. He stated his daughter is in [place] finishing up her spanish classes and his son is a Medical laboratory scientific officer in college.Pt appears a little dishelved -contracts for safety. No Si or HI. Pt. Stays in his room and reads quite a bit but did go outside with the other pts.

## 2011-07-31 NOTE — Progress Notes (Signed)
[facility] MD Progress Note  07/31/2011 1:51 PM  S/O: Patient seen and evaluated. Chart reviewed. Patient stated that his mood was "better". His affect was mood congruent and brighter. He denied any current thoughts of self injurious behavior, suicidal ideation or homicidal ideation. There were no auditory or visual hallucinations, paranoia, delusional thought processes, or mania noted. Thought process was linear and goal directed. No psychomotor agitation or retardation was noted. His speech was normal rate, tone and volume. Eye contact was good. Judgment and insight are fair. Patient has been up and engaged on the unit. No acute safety concerns reported from team. Reported continued s/s depression and new willingness to not continue Concerta which he was abusing.  Sleep:  Number of Hours: 6.25    Vital Signs:Blood pressure 138/86, pulse 75, temperature 96.9 F (36.1 C), temperature source Oral, resp. rate 20, height 5\' 9"  (1.753 m), weight 112.038 kg (247 lb).  Current Medications:     . ARIPiprazole  15 mg Oral Daily  . aspirin EC  81 mg Oral Daily  . buPROPion  300 mg Oral Daily  . chlordiazePOXIDE  25 mg Oral TID   Followed by  . chlordiazePOXIDE  25 mg Oral BH-qamhs   Followed by  . chlordiazePOXIDE  25 mg Oral Daily  . lamoTRIgine  150 mg Oral BID  . multivitamin with minerals  1 tablet Oral Daily  . thiamine  100 mg Intramuscular Once  . thiamine  100 mg Oral Daily  . DISCONTD: buPROPion  150 mg Oral Daily    Lab Results: No results found for this or any previous visit (from the past 48 hour(s)).  Physical Findings: CIWA:  CIWA-Ar Total: 0   CLINICAL FACTORS: Alcohol Dependence & W/D; Stimulant Dependence; BPAD and ADHD, per Hx; r/o PTSD and PD NOS with Borderline Traits   Plan: Increased bupropion for better control of reported depressive s/s, first increased does today.  Medication education completed.  Pros, cons, risks, potential side effects and benefits were discussed with  pt.  Pt agreeable with the plan.  See orders.  Discussed with team.  No hx seizures. Potential d/c tomorrow s/p completion of taper if pt remains stable with improved MSE.  Lupe Carney 07/31/2011, 1:51 PM

## 2011-07-31 NOTE — Progress Notes (Signed)
D slept fair last nite, appetite is good, ability to pay attention is good, depressed 5/10 and hopeless 5/10, denies Si or Hi, taking meds as ordered by MD, eating meals in DR, attending group. A q64min safety checks continue and support offered R safety maintained

## 2011-07-31 NOTE — Discharge Planning (Signed)
Met with patient in Aftercare Planning Group.   He explained that when he returns to live with his wife and children, he needs referral to a new psychiatrist.  He had been seeing Valinda Hoar, NP, at [facility], but has already terminated that relationship.  He felt she had prescribed medication to which he became addicted.  He wants referral to someone who would be informed about his addiction to ensure he will not receive that medication again.  He was not aware that [facility] has an outpatient program, but is open to referral there.  Case Manager described for patient the Chemical Dependency Intensive Outpatient Program, and he was very interested.  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  He displayed some insight into the need for such a long-term commitment.  Patient anticipates discharge Saturday 08/01/11, and will be picked up by his wife.  He does not need a letter regarding this hospitalization since he is self-employed.  Case Manager called Charmian Muff at [facility] CD-IOP and asked that she come to visit patient, explain the program to him.  Utilization review is to be done today, and during that, Case Manager will ask about authorization needs for CD-IOP.  Ambrose Mantle, LCSW 07/31/2011, 9:39 AM

## 2011-08-01 DIAGNOSIS — F1020 Alcohol dependence, uncomplicated: Secondary | ICD-10-CM

## 2011-08-01 MED ORDER — ASPIRIN EC 81 MG PO TBEC: 81.0000 mg | DELAYED_RELEASE_TABLET | Freq: Every day | ORAL | Status: DC

## 2011-08-01 MED ORDER — LAMOTRIGINE 150 MG PO TABS: 150.0000 mg | ORAL_TABLET | Freq: Two times a day (BID) | ORAL | Status: DC

## 2011-08-01 MED ORDER — ARIPIPRAZOLE 15 MG PO TABS: 15.0000 mg | ORAL_TABLET | Freq: Every day | ORAL | Status: DC

## 2011-08-01 MED ORDER — BUPROPION HCL ER (XL) 150 MG PO TB24: 150.0000 mg | ORAL_TABLET | Freq: Every day | ORAL | Status: DC
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
## 2011-08-01 NOTE — Progress Notes (Signed)
[facility] Case Management Discharge Plan:  Will you be returning to the same living situation after discharge: Yes,   At discharge, do you have transportation home?:Yes,  wife Do you have the ability to pay for your medications:Yes,    Interagency Information:     Release of information consent forms completed and in the chart;  Patient's signature needed at discharge.  Patient to Follow up at:  Follow-up Information    Follow up with Charmian Muff. (Case Manager will call you Monday with specifics about admission to this program)    Contact information:   CD-IOP [facility] [address] [place]  Telephone:  [phone]         Patient denies SI/HI:   Yes,      Safety Planning and Suicide Prevention discussed:  Yes, with pt  Barrier to discharge identified:No.  Summary and Recommendations:Pt was cleared for D/C and denies any and all S/I and H/I.  Pt was given suicide prevention booklet and agreed to use crisis numbers as needed.   [facility] 08/01/2011, 10:00 AM

## 2011-08-01 NOTE — Progress Notes (Signed)
Patient ID: [name], male   DOB: [birth year], 56 y.o.   MRN: [id number] Pt resting in bed with eyes closed.  Fifteen minute checks in progress. Pt safe on unit.

## 2011-08-01 NOTE — Progress Notes (Signed)
Patient ID: [name], male   DOB: 01/06/1956, 56 y.o.   MRN: [id number] Pt. attended and participated in aftercare planning group. Pt. accepted information on suicide prevention, warning signs to look for with suicide and crisis line numbers to use. The pt. agreed to call crisis line numbers if having warning signs or having thoughts of suicide. Pt. listed their current anxiety level as low and depression level as low.

## 2011-08-01 NOTE — Progress Notes (Signed)
Refer to dc note

## 2011-08-01 NOTE — Progress Notes (Signed)
Patient ID: [name], male   DOB: 1955-10-04, 56 y.o.   MRN: [id number] Pt cooperative during d/c process. All f/u and medication information reviewed. Patient understood CD-IOP f/u plans.  Denies SI or overt s/s of withdrawal.  All belongings returned. Escorted to lobby to care of family.

## 2011-08-04 NOTE — Progress Notes (Signed)
Patient Discharge Instructions:  After Visit Summary (AVS):   Access to EMR:  08/04/2011 Psychiatric Admission Assessment Note:   Access to EMR:  08/04/2011 Suicide Risk Assessment - Discharge Assessment:   Access to EMR:  08/04/2011 Next Level Care Provider Has Access to the EMR, 08/04/2011  Records provided via [facility]/Epic access to CD-IOP [facility] for follow up.  Karleen Hampshire Brittini, 08/04/2011, 12:21 PM
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
## 2011-08-24 ENCOUNTER — Other Ambulatory Visit: Payer: Self-pay | Admitting: Student

## 2011-08-24 DIAGNOSIS — R600 Localized edema: Secondary | ICD-10-CM

## 2011-08-25 ENCOUNTER — Ambulatory Visit
Admission: RE | Admit: 2011-08-25 | Discharge: 2011-08-25 | Disposition: A | Discharge status: Home / Self Care | Payer: No Typology Code available for payment source | Source: Ambulatory Visit | Attending: Student | Admitting: Student

## 2011-08-25 DIAGNOSIS — R600 Localized edema: Secondary | ICD-10-CM

## 2012-08-12 ENCOUNTER — Other Ambulatory Visit: Payer: Self-pay | Admitting: Gastroenterology

## 2012-12-14 ENCOUNTER — Ambulatory Visit
Admission: RE | Admit: 2012-12-14 | Discharge: 2012-12-14 | Disposition: A | Discharge status: Home / Self Care | Payer: 59 | Source: Ambulatory Visit | Attending: Family Medicine | Admitting: Family Medicine

## 2012-12-14 ENCOUNTER — Other Ambulatory Visit: Payer: Self-pay | Admitting: Family Medicine

## 2012-12-14 DIAGNOSIS — R0600 Dyspnea, unspecified: Secondary | ICD-10-CM

## 2013-04-10 ENCOUNTER — Other Ambulatory Visit: Payer: Self-pay | Admitting: Family Medicine

## 2013-04-10 DIAGNOSIS — R059 Cough, unspecified: Secondary | ICD-10-CM

## 2013-04-10 DIAGNOSIS — R05 Cough: Secondary | ICD-10-CM

## 2013-04-11 ENCOUNTER — Ambulatory Visit
Admission: RE | Admit: 2013-04-11 | Discharge: 2013-04-11 | Disposition: A | Discharge status: Home / Self Care | Payer: 59 | Source: Ambulatory Visit | Attending: Family Medicine | Admitting: Family Medicine

## 2013-04-11 DIAGNOSIS — R05 Cough: Secondary | ICD-10-CM

## 2013-04-11 DIAGNOSIS — R059 Cough, unspecified: Secondary | ICD-10-CM

## 2013-04-11 MED ORDER — IOHEXOL 300 MG/ML  SOLN: 75.0000 mL | Freq: Once | INTRAMUSCULAR | Status: AC | PRN

## 2013-04-12 ENCOUNTER — Encounter (HOSPITAL_COMMUNITY): Payer: Self-pay | Admitting: General Practice

## 2013-04-12 ENCOUNTER — Inpatient Hospital Stay (HOSPITAL_COMMUNITY): Payer: 59

## 2013-04-12 ENCOUNTER — Inpatient Hospital Stay (HOSPITAL_COMMUNITY)
Admission: AD | Admit: 2013-04-12 | Discharge: 2013-04-15 | DRG: 194 | Disposition: A | Discharge status: Home / Self Care | Payer: 59 | Source: Ambulatory Visit | Attending: Internal Medicine | Admitting: Internal Medicine

## 2013-04-12 DIAGNOSIS — F1011 Alcohol abuse, in remission: Secondary | ICD-10-CM | POA: Diagnosis present

## 2013-04-12 DIAGNOSIS — B338 Other specified viral diseases: Secondary | ICD-10-CM | POA: Diagnosis present

## 2013-04-12 DIAGNOSIS — F3132 Bipolar disorder, current episode depressed, moderate: Secondary | ICD-10-CM

## 2013-04-12 DIAGNOSIS — J45901 Unspecified asthma with (acute) exacerbation: Secondary | ICD-10-CM | POA: Diagnosis present

## 2013-04-12 DIAGNOSIS — J189 Pneumonia, unspecified organism: Principal | ICD-10-CM

## 2013-04-12 DIAGNOSIS — F1020 Alcohol dependence, uncomplicated: Secondary | ICD-10-CM

## 2013-04-12 DIAGNOSIS — Z87891 Personal history of nicotine dependence: Secondary | ICD-10-CM

## 2013-04-12 DIAGNOSIS — R599 Enlarged lymph nodes, unspecified: Secondary | ICD-10-CM

## 2013-04-12 DIAGNOSIS — B974 Respiratory syncytial virus as the cause of diseases classified elsewhere: Secondary | ICD-10-CM | POA: Diagnosis present

## 2013-04-12 DIAGNOSIS — F1910 Other psychoactive substance abuse, uncomplicated: Secondary | ICD-10-CM | POA: Diagnosis present

## 2013-04-12 DIAGNOSIS — R590 Localized enlarged lymph nodes: Secondary | ICD-10-CM | POA: Diagnosis present

## 2013-04-12 DIAGNOSIS — F3130 Bipolar disorder, current episode depressed, mild or moderate severity, unspecified: Secondary | ICD-10-CM | POA: Diagnosis present

## 2013-04-12 DIAGNOSIS — F1010 Alcohol abuse, uncomplicated: Secondary | ICD-10-CM

## 2013-04-12 DIAGNOSIS — R0602 Shortness of breath: Secondary | ICD-10-CM | POA: Diagnosis present

## 2013-04-12 DIAGNOSIS — Z79899 Other long term (current) drug therapy: Secondary | ICD-10-CM

## 2013-04-12 HISTORY — DX: Pneumonia, unspecified organism: J18.9

## 2013-04-12 HISTORY — DX: Depression, unspecified: F32.A

## 2013-04-12 LAB — CREATININE, SERUM
Creatinine, Ser: 0.97 mg/dL (ref 0.50–1.35)
GFR calc Af Amer: >90 mL/min (ref >90)
GFR calc non Af Amer: 89 mL/min — ABNORMAL LOW (ref >90)

## 2013-04-12 LAB — COMPREHENSIVE METABOLIC PANEL
ALT: 18 U/L (ref 0–53)
AST: 24 U/L (ref 0–37)
Albumin: 4.1 g/dL (ref 3.5–5.2)
Alkaline Phosphatase: 128 U/L — ABNORMAL HIGH (ref 39–117)
BUN: 13 mg/dL (ref 6–23)
CALCIUM: 9.1 mg/dL (ref 8.4–10.5)
CHLORIDE: 92 meq/L — AB (ref 96–112)
CO2: 27 meq/L (ref 19–32)
Creatinine, Ser: 0.98 mg/dL (ref 0.50–1.35)
GFR calc Af Amer: >90 mL/min (ref >90)
GFR, EST NON AFRICAN AMERICAN: 88 mL/min — AB (ref >90)
Glucose, Bld: 97 mg/dL (ref 70–99)
POTASSIUM: 4.7 meq/L (ref 3.7–5.3)
SODIUM: 132 meq/L — AB (ref 137–147)
Total Bilirubin: 0.9 mg/dL (ref 0.3–1.2)
Total Protein: 7.6 g/dL (ref 6.0–8.3)

## 2013-04-12 LAB — CBC WITH DIFFERENTIAL/PLATELET
BASOS PCT: 1 % (ref 0–1)
Basophils Absolute: 0 10*3/uL (ref 0.0–0.1)
EOS ABS: 0.1 10*3/uL (ref 0.0–0.7)
Eosinophils Relative: 1 % (ref 0–5)
HCT: 42.5 % (ref 39.0–52.0)
HEMOGLOBIN: 15.1 g/dL (ref 13.0–17.0)
Lymphocytes Relative: 18 % (ref 12–46)
Lymphs Abs: 1.5 10*3/uL (ref 0.7–4.0)
MCH: 31.8 pg (ref 26.0–34.0)
MCHC: 35.5 g/dL (ref 30.0–36.0)
MCV: 89.5 fL (ref 78.0–100.0)
MONO ABS: 1.1 10*3/uL — AB (ref 0.1–1.0)
MONOS PCT: 13 % — AB (ref 3–12)
NEUTROS PCT: 68 % (ref 43–77)
Neutro Abs: 5.7 10*3/uL (ref 1.7–7.7)
Platelets: 241 10*3/uL (ref 150–400)
RBC: 4.75 MIL/uL (ref 4.22–5.81)
RDW: 12 % (ref 11.5–15.5)
WBC: 8.5 10*3/uL (ref 4.0–10.5)

## 2013-04-12 LAB — CBC
HCT: 43.3 % (ref 39.0–52.0)
HEMOGLOBIN: 15.2 g/dL (ref 13.0–17.0)
MCH: 31.3 pg (ref 26.0–34.0)
MCHC: 35.1 g/dL (ref 30.0–36.0)
MCV: 89.3 fL (ref 78.0–100.0)
PLATELETS: 241 10*3/uL (ref 150–400)
RBC: 4.85 MIL/uL (ref 4.22–5.81)
RDW: 12 % (ref 11.5–15.5)
WBC: 9.7 10*3/uL (ref 4.0–10.5)

## 2013-04-12 LAB — STREP PNEUMONIAE URINARY ANTIGEN: Strep Pneumo Urinary Antigen: NEGATIVE

## 2013-04-12 LAB — HIV ANTIBODY (ROUTINE TESTING W REFLEX): HIV: NONREACTIVE

## 2013-04-12 MED ORDER — ALBUTEROL SULFATE (2.5 MG/3ML) 0.083% IN NEBU: 2.5000 mg | INHALATION_SOLUTION | RESPIRATORY_TRACT | Status: DC | PRN

## 2013-04-12 MED ORDER — IPRATROPIUM-ALBUTEROL 0.5-2.5 (3) MG/3ML IN SOLN
3.0000 mL | RESPIRATORY_TRACT | Status: DC
  Administered 2013-04-12 (×2): 3 mL via RESPIRATORY_TRACT
  Filled 2013-04-12 (×2): qty 3

## 2013-04-12 MED ORDER — TRAZODONE HCL 50 MG PO TABS
50.0000 mg | ORAL_TABLET | Freq: Every evening | ORAL | Status: DC | PRN
  Administered 2013-04-12: 50 mg via ORAL
  Filled 2013-04-12: qty 1

## 2013-04-12 MED ORDER — ENOXAPARIN SODIUM 40 MG/0.4ML ~~LOC~~ SOLN
40.0000 mg | SUBCUTANEOUS | Status: DC
  Administered 2013-04-12 – 2013-04-14 (×3): 40 mg via SUBCUTANEOUS
  Filled 2013-04-12 (×4): qty 0.4

## 2013-04-12 MED ORDER — HYDROCOD POLST-CHLORPHEN POLST 10-8 MG/5ML PO LQCR
5.0000 mL | Freq: Two times a day (BID) | ORAL | Status: DC | PRN
  Administered 2013-04-12: 5 mL via ORAL
  Filled 2013-04-12: qty 5

## 2013-04-12 MED ORDER — GUAIFENESIN ER 600 MG PO TB12
1200.0000 mg | ORAL_TABLET | Freq: Two times a day (BID) | ORAL | Status: DC
  Administered 2013-04-12 – 2013-04-15 (×7): 1200 mg via ORAL
  Filled 2013-04-12 (×10): qty 2

## 2013-04-12 MED ORDER — ONDANSETRON HCL 4 MG/2ML IJ SOLN: 4.0000 mg | Freq: Four times a day (QID) | INTRAMUSCULAR | Status: DC | PRN

## 2013-04-12 MED ORDER — LORATADINE 10 MG PO TABS
10.0000 mg | ORAL_TABLET | Freq: Every day | ORAL | Status: DC
  Administered 2013-04-13 – 2013-04-15 (×3): 10 mg via ORAL
  Filled 2013-04-12 (×4): qty 1

## 2013-04-12 MED ORDER — DEXTROSE 5 % IV SOLN
1.0000 g | INTRAVENOUS | Status: DC
  Administered 2013-04-12 – 2013-04-14 (×3): 1 g via INTRAVENOUS
  Filled 2013-04-12 (×4): qty 10

## 2013-04-12 MED ORDER — ONDANSETRON HCL 4 MG PO TABS: 4.0000 mg | ORAL_TABLET | Freq: Four times a day (QID) | ORAL | Status: DC | PRN

## 2013-04-12 MED ORDER — PANTOPRAZOLE SODIUM 40 MG PO TBEC
40.0000 mg | DELAYED_RELEASE_TABLET | Freq: Every day | ORAL | Status: DC
  Administered 2013-04-12 – 2013-04-14 (×3): 40 mg via ORAL
  Filled 2013-04-12 (×3): qty 1

## 2013-04-12 MED ORDER — PROMETHAZINE-CODEINE 6.25-10 MG/5ML PO SYRP
5.0000 mL | ORAL_SOLUTION | Freq: Four times a day (QID) | ORAL | Status: DC | PRN
  Administered 2013-04-12 – 2013-04-15 (×10): 5 mL via ORAL
  Filled 2013-04-12 (×9): qty 5

## 2013-04-12 MED ORDER — FLUTICASONE PROPIONATE 50 MCG/ACT NA SUSP
2.0000 | Freq: Every day | NASAL | Status: DC
  Administered 2013-04-12 – 2013-04-15 (×4): 2 via NASAL
  Filled 2013-04-12: qty 16

## 2013-04-12 MED ORDER — LAMOTRIGINE 150 MG PO TABS
150.0000 mg | ORAL_TABLET | Freq: Two times a day (BID) | ORAL | Status: DC
  Filled 2013-04-12 (×2): qty 1

## 2013-04-12 MED ORDER — ACETAMINOPHEN 650 MG RE SUPP: 650.0000 mg | Freq: Four times a day (QID) | RECTAL | Status: DC | PRN

## 2013-04-12 MED ORDER — LAMOTRIGINE 150 MG PO TABS
150.0000 mg | ORAL_TABLET | Freq: Every day | ORAL | Status: DC
  Administered 2013-04-12 – 2013-04-14 (×3): 150 mg via ORAL
  Filled 2013-04-12 (×4): qty 1

## 2013-04-12 MED ORDER — OXYCODONE HCL 5 MG PO TABS
5.0000 mg | ORAL_TABLET | ORAL | Status: DC | PRN
  Administered 2013-04-12 – 2013-04-14 (×3): 5 mg via ORAL
  Filled 2013-04-12 (×3): qty 1

## 2013-04-12 MED ORDER — DEXTROSE 5 % IV SOLN
500.0000 mg | INTRAVENOUS | Status: DC
  Administered 2013-04-12 – 2013-04-14 (×3): 500 mg via INTRAVENOUS
  Filled 2013-04-12 (×4): qty 500

## 2013-04-12 MED ORDER — ALBUTEROL SULFATE (2.5 MG/3ML) 0.083% IN NEBU
2.5000 mg | INHALATION_SOLUTION | RESPIRATORY_TRACT | Status: DC | PRN
  Filled 2013-04-12: qty 3

## 2013-04-12 MED ORDER — SODIUM CHLORIDE 0.9 % IV SOLN: 250.0000 mL | INTRAVENOUS | Status: DC | PRN

## 2013-04-12 MED ORDER — MORPHINE SULFATE 2 MG/ML IJ SOLN: 1.0000 mg | INTRAMUSCULAR | Status: DC | PRN

## 2013-04-12 MED ORDER — IPRATROPIUM-ALBUTEROL 0.5-2.5 (3) MG/3ML IN SOLN
3.0000 mL | Freq: Three times a day (TID) | RESPIRATORY_TRACT | Status: DC
  Administered 2013-04-13 – 2013-04-15 (×7): 3 mL via RESPIRATORY_TRACT
  Filled 2013-04-12 (×7): qty 3

## 2013-04-12 MED ORDER — VILAZODONE HCL 10 & 20 & 40 MG PO KIT: 30.0000 mg | PACK | Freq: Every day | ORAL | Status: DC

## 2013-04-12 MED ORDER — ACETAMINOPHEN 325 MG PO TABS
650.0000 mg | ORAL_TABLET | Freq: Four times a day (QID) | ORAL | Status: DC | PRN
  Administered 2013-04-12 – 2013-04-14 (×3): 650 mg via ORAL
  Filled 2013-04-12 (×3): qty 2

## 2013-04-12 MED ORDER — SODIUM CHLORIDE 0.9 % IJ SOLN
3.0000 mL | INTRAMUSCULAR | Status: DC | PRN
  Administered 2013-04-13: 3 mL via INTRAVENOUS

## 2013-04-12 MED ORDER — OMEGA-3-ACID ETHYL ESTERS 1 G PO CAPS
4.0000 g | ORAL_CAPSULE | Freq: Every day | ORAL | Status: DC
  Administered 2013-04-12 – 2013-04-15 (×4): 4 g via ORAL
  Filled 2013-04-12 (×4): qty 4

## 2013-04-12 MED ORDER — SODIUM CHLORIDE 0.9 % IJ SOLN
3.0000 mL | Freq: Two times a day (BID) | INTRAMUSCULAR | Status: DC
  Administered 2013-04-13 – 2013-04-14 (×4): 3 mL via INTRAVENOUS

## 2013-04-12 MED ORDER — METHYLPREDNISOLONE SODIUM SUCC 125 MG IJ SOLR
60.0000 mg | Freq: Three times a day (TID) | INTRAMUSCULAR | Status: DC
  Administered 2013-04-12 – 2013-04-14 (×6): 60 mg via INTRAVENOUS
  Filled 2013-04-12 (×9): qty 0.96

## 2013-04-12 MED ORDER — DIPHENHYDRAMINE HCL 25 MG PO CAPS: 25.0000 mg | ORAL_CAPSULE | Freq: Three times a day (TID) | ORAL | Status: DC | PRN

## 2013-04-12 NOTE — H&P (Signed)
[facility]                                                                                    Patient Demographics  [name], is a 58 y.o. male  MRN: [id number]   DOB - 05-10-1955  Admit Date - 04/12/2013  Outpatient Primary MD for the patient is Mayra Neer, MD   With History of -  Past Medical History  Diagnosis Date  . Substance abuse   . Anxiety   . Mental disorder     ADD, bipolar  . Depression   . Pneumonia 04/12/2013      Past Surgical History  Procedure Laterality Date  . Appendectomy    . Colonoscopy      in for   Cough, dyspnea, SOB    HPI  [name]  is a 58 y.o. male, with a PMH of PSA (alcohol mostly), anxiety, depression, and bipolar disorder who presents as a direct admission with "a couple of weeks" of worsening cough, SOB, fatigue and malaise.Per patient he has been having intermittent cough and shortness since last October. However  most of these episodes have resolved spontaneously. Approximately 2-3 weeks back, patient started having shortness of breath and cough. Cough is mostly dry, however at times it can be productive of whitish phlegm. He was seen by his PCP 2 days ago and started on Levaquin.However he continued to have persistent cough, and a CT scan of the chest done yesterday showed pneumonia and mediastinal lymphadenopathy. Is a persistent symptoms, patient was then referred to [facility] as a direct admission by the patient's primary care practitioner. Patient is ex-smoker, quit approximately 20 years ago. Denied any fever, nausea, vomiting or diarrhea. Complains that his head hurts a lot from persistent coughing.  Review of Systems   A full 10 point Review of Systems was done, except as stated above, all other Review of Systems were negative.   Social History History  Substance Use Topics  . Smoking status: Former Smoker    Quit date: 02/16/1993  . Smokeless tobacco: Never Used  . Alcohol Use: Yes     Comment:  pint a day     Family History   Prior to Admission medications   Medication Sig Start Date End Date Taking? Authorizing Provider  HYDROcodone-homatropine (HYCODAN) 5-1.5 MG/5ML syrup Take 5 mLs by mouth every 4 (four) hours as needed for cough.   Yes Historical Provider, MD  lamoTRIgine (LAMICTAL) 150 MG tablet Take 150 mg by mouth at bedtime. Mood /depression 08/01/11  Yes Gwenith Daily, PA-C  levofloxacin (LEVAQUIN) 750 MG tablet Take 750 mg by mouth daily. X 7 days. Started on 04/09/13. 04/09/13  Yes Historical Provider, MD  omega-3 acid ethyl esters (LOVAZA) 1 G capsule Take 4 g by mouth daily.   Yes Historical Provider, MD  traMADol (ULTRAM) 50 MG tablet Take 50-100 mg by mouth every 6 (six) hours as needed for moderate pain.  04/11/13  Yes Historical Provider, MD  traZODone (DESYREL) 50 MG tablet Take 50 mg by mouth at bedtime as needed for sleep.  03/16/13  Yes Historical Provider, MD  Vilazodone HCl (VIIBRYD) 10 & 20 &  40 MG KIT Take 30 mg by mouth daily.   Yes Historical Provider, MD    No Known Allergies  Physical Exam  Vitals  Blood pressure 136/92, pulse 83, temperature 98.2 F (36.8 C), temperature source Oral, resp. rate 18, height 5' 10"  (1.778 m), weight 108.863 kg (240 lb), SpO2 94.00%.   General:  Patient is sitting in bed, NAD, mildly anxious   Psych:  Normal affect and insight, mildly anxious, Awake Alert, Oriented X 3.  Neuro:   Strength 5/5 all 4 extremities.  ENT:  Ears and Eyes appear Normal, Conjunctivae clear, PERRL. Moist Oral Mucosa.  Neck:  Supple Neck, No JVD, No cervical lymphadenopathy appriciated.  Respiratory:  Symmetrical Chest wall movement, Good air movement bilaterally, some crackles heard at bases. Also has coarse rhonchi all over.  Cardiac:  RRR, No Gallops, Rubs or Murmurs.  Abdomen:  Positive Bowel Sounds, Abdomen Soft, Non tender, No organomegaly appreciated.  Skin:  No Cyanosis, No Skin Rash or Bruising.  Extremities:  Good  muscle tone,  joints appear normal , no effusions, Normal ROM.  Data Review  CBC  Recent Labs Lab 04/12/13 1130  WBC 8.5  HGB 15.1  HCT 42.5  PLT 241  MCV 89.5  MCH 31.8  MCHC 35.5  RDW 12.0  LYMPHSABS 1.5  MONOABS 1.1*  EOSABS 0.1  BASOSABS 0.0   ------------------------------------------------------------------------------------------------------------------  Chemistries   Recent Labs Lab 04/12/13 1130  NA 132*  K 4.7  CL 92*  CO2 27  GLUCOSE 97  BUN 13  CREATININE 0.98  CALCIUM 9.1  AST 24  ALT 18  ALKPHOS 128*  BILITOT 0.9   ------------------------------------------------------------------------------------------------------------------ estimated creatinine clearance is 100.3 ml/min (by C-G formula based on Cr of 0.98).  Urinalysis    Component Value Date/Time   COLORURINE YELLOW 07/28/2011 0806   APPEARANCEUR CLEAR 07/28/2011 0806   LABSPEC 1.017 07/28/2011 0806   PHURINE 7.0 07/28/2011 0806   GLUCOSEU NEGATIVE 07/28/2011 0806   HGBUR NEGATIVE 07/28/2011 0806   BILIRUBINUR NEGATIVE 07/28/2011 0806   KETONESUR NEGATIVE 07/28/2011 0806   PROTEINUR NEGATIVE 07/28/2011 0806   UROBILINOGEN 1.0 07/28/2011 0806   NITRITE NEGATIVE 07/28/2011 0806   LEUKOCYTESUR NEGATIVE 07/28/2011 0806    ----------------------------------------------------------------------------------------------------------------  Imaging results:   Ct Chest W Contrast  04/11/2013   CLINICAL DATA:  Evaluate right lung base pneumonia  EXAM: CT CHEST WITH CONTRAST  TECHNIQUE: Multidetector CT imaging of the chest was performed during intravenous contrast administration.  CONTRAST:  75 cc of Omni 300  COMPARISON:  04/09/2013  FINDINGS: There is no pleural effusion identified. Bilateral lower lobe infiltrates are identified compatible with multifocal pneumonia. Right hilar lymph node measures 1.4 cm, image 34/series 2. Peripheral nodule within the right lower lobe measured 8 mm, image 42/series 3.  There is a right paratracheal lymph node which measures 1.2 cm, image 15/series 2. The sub- carinal lymph node measures 1.1 cm.  The heart size appears normal. No pericardial effusion. Mild calcified atherosclerotic disease involves the thoracic aorta.  No axillary or supraclavicular adenopathy identified.  Incidental imaging through the upper abdomen shows no acute findings. There is a small low attenuation structure along the dome of the liver measuring 8 mm, image 43/series 2.  Review of the visualized osseous structures is on unremarkable. No aggressive lytic or sclerotic bone lesions identified.  IMPRESSION: 1. Bilateral lower lobe infiltrates. In the acute setting these likely represent bronchopneumonia. This will need close interval followup to ensure complete resolution. If this does not  resolve then bronchoscopy would be advised to assess for underlying neoplasm. 2. Enlarged right hilar, sub- carinal and right paratracheal lymph nodes. In the setting of pneumonia these may represent reactive adenopathy. Given the patient's risk factors for lung cancer these will need close interval monitoring to ensure resolution and to rule out metastatic adenopathy. These results will be called to the ordering clinician or representative by the Radiologist Assistant, and communication documented in the PACS Dashboard.   Electronically Signed   By: Kerby Moors M.D.   On: 04/11/2013 10:26   Dg Chest Port 1 View  04/12/2013   CLINICAL DATA:  Chest pain with cough and dyspnea, previous history of bronchitis  EXAM: PORTABLE CHEST - 1 VIEW  COMPARISON:  CT CHEST W/CM dated 04/11/2013; DG CHEST 2V dated 04/09/2013  FINDINGS: The lungs are adequately inflated. There are increased lung markings in the left infrahilar region and at the right lung base. The right hemidiaphragm is partially obscured today. The cardiopericardial silhouette is mildly enlarged though stable. The pulmonary vascularity is not engorged.  IMPRESSION:  Basilar atelectasis or pneumonia on the right and infrahilar atelectasis on the left is present. These findings are more conspicuous than on the earlier study.   Electronically Signed   By: David  [place]   On: 04/12/2013 11:55    Assessment & Plan  Primary Problems:  Community acquired Pneumonia - Will admit and place on IV Rocephin and Zithromax. We'll provide antitussives. Continue to monitor closely. Blood cultures will be obtained if temperature more than 101F, will obtain urine for Legionella and pneumococcal antigen  Mediastinal lymphadenopathy - Likely reactive in a setting of infiltrates. We'll treat pneumonia, and repeat CT chest in the next 2-3 months, if still persistent then consider further workup at that point.  Shortness of breath- bronchospasm -Likely secondary to PNA -Continue hycodan and mucolytics, will place on steroids, nebulized bronchodilators.  Bipolar Disorder - Continue home medications  Polysubstance Abuse -Hx of 20 years of mainly alcohol abuse- no signs of any alcohol withdrawal currently. -Has talked to wife and sponsor and agrees with plan to cautiously continue hycodan   DVT Prophylaxis  Lovenox -   AM Labs Ordered, also please review Full Orders  Family Communication: Wife is at bedside   Code Status:  Full Code  Likely DC to Home  Condition:  Stable in NAD  Time spent in minutes : Elrod, Kate PA-S on 04/12/2013 at 12:41 PM Between 7am to 7pm - Pager - [phone] After 7pm go to [email or web address] - password [email or web address] And look for the night coverage person covering me after hours  [facility] Office  [phone]  Attending - Patient seen and examined, agree with the above assessment and plan. 59 year old male who has had intermittent cough and mostly exertional shortness of breath for the past few months, referred from the patient's primary care practitioner's office for worsening cough and shortness of breath that has  been going on for at least 2-3 weeks. Was just started on Levaquin 3 days ago. CT chest done as an outpatient shows bilateral pneumonia with yesterday lymphadenopathy. We'll place on Rocephin, Zithromax, steroids, nebs and other supportive care. Otherwise stable. We'll continue to follow closely.  Nena Alexander MD

## 2013-04-12 NOTE — Progress Notes (Signed)
Pt able to demonstrate how to do flutter on his own. Pt instructed to perform every 4 hours while awake

## 2013-04-13 DIAGNOSIS — I369 Nonrheumatic tricuspid valve disorder, unspecified: Secondary | ICD-10-CM

## 2013-04-13 LAB — RESPIRATORY VIRUS PANEL
Adenovirus: NOT DETECTED
INFLUENZA A H1: NOT DETECTED
Influenza A H3: NOT DETECTED
Influenza A: NOT DETECTED
Influenza B: NOT DETECTED
METAPNEUMOVIRUS: DETECTED — AB
Parainfluenza 1: NOT DETECTED
Parainfluenza 2: NOT DETECTED
Parainfluenza 3: NOT DETECTED
RESPIRATORY SYNCYTIAL VIRUS A: NOT DETECTED
RHINOVIRUS: NOT DETECTED
Respiratory Syncytial Virus B: DETECTED — AB

## 2013-04-13 LAB — BASIC METABOLIC PANEL
BUN: 13 mg/dL (ref 6–23)
CALCIUM: 9.3 mg/dL (ref 8.4–10.5)
CO2: 25 mEq/L (ref 19–32)
CREATININE: 0.78 mg/dL (ref 0.50–1.35)
Chloride: 92 mEq/L — ABNORMAL LOW (ref 96–112)
GFR calc non Af Amer: >90 mL/min (ref >90)
Glucose, Bld: 142 mg/dL — ABNORMAL HIGH (ref 70–99)
Potassium: 4.8 mEq/L (ref 3.7–5.3)
Sodium: 130 mEq/L — ABNORMAL LOW (ref 137–147)

## 2013-04-13 LAB — CBC
HCT: 40.1 % (ref 39.0–52.0)
Hemoglobin: 14.3 g/dL (ref 13.0–17.0)
MCH: 31.4 pg (ref 26.0–34.0)
MCHC: 35.7 g/dL (ref 30.0–36.0)
MCV: 88.1 fL (ref 78.0–100.0)
Platelets: 243 10*3/uL (ref 150–400)
RBC: 4.55 MIL/uL (ref 4.22–5.81)
RDW: 11.9 % (ref 11.5–15.5)
WBC: 6.8 10*3/uL (ref 4.0–10.5)

## 2013-04-13 MED ORDER — VILAZODONE HCL 10 MG PO TABS: 40.0000 mg | ORAL_TABLET | Freq: Every day | ORAL | Status: DC

## 2013-04-13 MED ORDER — VILAZODONE HCL 10 MG PO TABS
30.0000 mg | ORAL_TABLET | Freq: Every day | ORAL | Status: DC
  Administered 2013-04-13 – 2013-04-15 (×3): 30 mg via ORAL
  Filled 2013-04-13: qty 3
  Filled 2013-04-13: qty 2

## 2013-04-13 MED ORDER — VILAZODONE HCL 10 MG PO TABS
10.0000 mg | ORAL_TABLET | Freq: Every day | ORAL | Status: DC
  Filled 2013-04-13: qty 1

## 2013-04-13 MED ORDER — NON FORMULARY: 20.0000 mg | Freq: Every day | Status: DC

## 2013-04-13 MED ORDER — NON FORMULARY: 40.0000 mg | Freq: Every day | Status: DC

## 2013-04-13 MED ORDER — VILAZODONE HCL 10 MG PO TABS: 20.0000 mg | ORAL_TABLET | Freq: Every day | ORAL | Status: DC

## 2013-04-13 MED ORDER — NON FORMULARY: 10.0000 mg | Freq: Every day | Status: DC

## 2013-04-13 NOTE — Progress Notes (Signed)
Echocardiogram 2D Echocardiogram has been performed.  Dorothey BasemanReel, Takyia Sindt M 04/13/2013, 3:36 PM

## 2013-04-13 NOTE — Progress Notes (Signed)
PATIENT DETAILS Name: [name] Age: 58 y.o. Sex: male Date of Birth: [birth year] Admit Date: 04/12/2013 Admitting Physician Maretta Bees, MD [id number], MD  Subjective: Slightly better than yesterday, able to sleep last night.  Assessment/Plan: Principal Problem:   PNA (pneumonia) - Patient was admitted with several weeks history of cough and shortness of breath. He has been having these problems intermittently since October. He had seen his primary care practitioner a few days prior to this admission and was started on Levaquin which she had taken for 3 days. A. outpatient CT of the chest showed bilateral pneumonia and mediastinal lymphadenopathy, as a result patient was directly admitted. - On admission, patient was started on Rocephin and Zithromax (day 2), IV Solu-Medrol, nebulized bronchodilators and other antitussives medications. He has improved somewhat compared to admission, says the cough is decreased slightly, was able to sleep last night. He remains afebrile and without any leukocytosis.  Active Problems: Persistent cough - Likely secondary to above. Suspect patient has some component of reactive airway disease as well. - Continue antibiotics to treat above, continue nebulized bronchodilators and steroids. - Continue with Claritin, PPI. - Continue other supportive care.  ? Asthma exacerbation - Suspect some amount of reactive airway disease,patient was wheezing on exam on admission. Patient was started on steroids, and scheduled nebulized bronchodilators. He seems to have made somewhat of an improvement today, with less rhonchi on exam. - Continue steroids, nebulized focal dilators, incentive spirometry and flutter valve. - Will likely need a pulmonary function test to be done in the outpatient setting   Mediastinal lymphadenopathy  - Likely reactive in a setting of infiltrates. We'll treat pneumonia, and repeat CT chest in the next 2-3 months, if  still persistent then consider further workup at that point.   Bipolar Disorder  - Continue home medications   Polysubstance Abuse  -Hx of 20 years of mainly alcohol abuse- no signs of any alcohol withdrawal currently.  - Claims he has not had a drink in years  Disposition: Remain inpatient  DVT Prophylaxis: Prophylactic Lovenox  Code Status: Full code  Family Communication Spouse on admission  Procedures:  None  CONSULTS:  None  MEDICATIONS: Scheduled Meds: . azithromycin  500 mg Intravenous Q24H  . cefTRIAXone (ROCEPHIN)  IV  1 g Intravenous Q24H  . enoxaparin (LOVENOX) injection  40 mg Subcutaneous Q24H  . fluticasone  2 spray Each Nare Daily  . guaiFENesin  1,200 mg Oral BID  . ipratropium-albuterol  3 mL Nebulization TID  . lamoTRIgine  150 mg Oral QHS  . loratadine  10 mg Oral Daily  . methylPREDNISolone (SOLU-MEDROL) injection  60 mg Intravenous 3 times per day  . omega-3 acid ethyl esters  4 g Oral Daily  . pantoprazole  40 mg Oral Q1200  . sodium chloride  3 mL Intravenous Q12H  . Vilazodone HCl  10 mg Oral Daily   Followed by  . [START ON 04/19/2013] Vilazodone HCl  20 mg Oral Daily   Followed by  . [START ON 04/25/2013] Vilazodone HCl  40 mg Oral Daily   Continuous Infusions:  PRN Meds:.sodium chloride, acetaminophen, acetaminophen, albuterol, diphenhydrAMINE, morphine injection, ondansetron (ZOFRAN) IV, ondansetron, oxyCODONE, promethazine-codeine, sodium chloride, traZODone  Antibiotics: Anti-infectives   Start     Dose/Rate Route Frequency Ordered Stop   04/12/13 1500  cefTRIAXone (ROCEPHIN) 1 g in dextrose 5 % 50 mL IVPB     1 g 100 mL/hr over 30 Minutes  Intravenous Every 24 hours 04/12/13 1413     04/12/13 1500  azithromycin (ZITHROMAX) 500 mg in dextrose 5 % 250 mL IVPB     500 mg 250 mL/hr over 60 Minutes Intravenous Every 24 hours 04/12/13 1413        PHYSICAL EXAM: Vital signs in last 24 hours: Filed Vitals:   04/12/13 1125 04/12/13  1454 04/12/13 2212 04/13/13 0541  BP: 136/92 148/83 136/78 133/72  Pulse: 83 79 82 77  Temp: 98.2 F (36.8 C) 97.5 F (36.4 C) 97.8 F (36.6 C) 97.8 F (36.6 C)  TempSrc: Oral Oral Oral Oral  Resp: 18 18 20 18   Height: 5\' 10"  (1.778 m)     Weight: 108.863 kg (240 lb)     SpO2: 94% 93% 92% 90%    Weight change:  Filed Weights   04/12/13 1125  Weight: 108.863 kg (240 lb)   Body mass index is 34.44 kg/(m^2).   Gen Exam: Awake and alert with clear speech.  Neck: Supple, No JVD.   Chest: Good air entry bilaterally, coarse breath sounds, scattered rhonchi CVS: S1 S2 Regular, no murmurs.  Abdomen: soft, BS +, non tender, non distended.  Extremities: no edema, lower extremities warm to touch. Neurologic: Non Focal.   Skin: No Rash.   Wounds: N/A.   Intake/Output from previous day:  Intake/Output Summary (Last 24 hours) at 04/13/13 1055 Last data filed at 04/12/13 2100  Gross per 24 hour  Intake   1240 ml  Output    160 ml  Net   1080 ml     LAB RESULTS: CBC  Recent Labs Lab 04/12/13 1130 04/12/13 1430 04/13/13 0424  WBC 8.5 9.7 6.8  HGB 15.1 15.2 14.3  HCT 42.5 43.3 40.1  PLT 241 241 243  MCV 89.5 89.3 88.1  MCH 31.8 31.3 31.4  MCHC 35.5 35.1 35.7  RDW 12.0 12.0 11.9  LYMPHSABS 1.5  --   --   MONOABS 1.1*  --   --   EOSABS 0.1  --   --   BASOSABS 0.0  --   --     Chemistries   Recent Labs Lab 04/12/13 1130 04/12/13 1430 04/13/13 0424  NA 132*  --  130*  K 4.7  --  4.8  CL 92*  --  92*  CO2 27  --  25  GLUCOSE 97  --  142*  BUN 13  --  13  CREATININE 0.98 0.97 0.78  CALCIUM 9.1  --  9.3    CBG: No results found for this basename: GLUCAP,  in the last 168 hours  GFR Estimated Creatinine Clearance: 122.9 ml/min (by C-G formula based on Cr of 0.78).  Coagulation profile No results found for this basename: INR, PROTIME,  in the last 168 hours  Cardiac Enzymes No results found for this basename: CK, CKMB, TROPONINI, MYOGLOBIN,  in the last  168 hours  No components found with this basename: POCBNP,  No results found for this basename: DDIMER,  in the last 72 hours No results found for this basename: HGBA1C,  in the last 72 hours No results found for this basename: CHOL, HDL, LDLCALC, TRIG, CHOLHDL, LDLDIRECT,  in the last 72 hours No results found for this basename: TSH, T4TOTAL, FREET3, T3FREE, THYROIDAB,  in the last 72 hours No results found for this basename: VITAMINB12, FOLATE, FERRITIN, TIBC, IRON, RETICCTPCT,  in the last 72 hours No results found for this basename: LIPASE, AMYLASE,  in the last  72 hours  Urine Studies No results found for this basename: UACOL, UAPR, USPG, UPH, UTP, UGL, UKET, UBIL, UHGB, UNIT, UROB, ULEU, UEPI, UWBC, URBC, UBAC, CAST, CRYS, UCOM, BILUA,  in the last 72 hours  MICROBIOLOGY: No results found for this or any previous visit (from the past 240 hour(s)).  RADIOLOGY STUDIES/RESULTS: Ct Chest W Contrast  04/11/2013   CLINICAL DATA:  Evaluate right lung base pneumonia  EXAM: CT CHEST WITH CONTRAST  TECHNIQUE: Multidetector CT imaging of the chest was performed during intravenous contrast administration.  CONTRAST:  75 cc of Omni 300  COMPARISON:  04/09/2013  FINDINGS: There is no pleural effusion identified. Bilateral lower lobe infiltrates are identified compatible with multifocal pneumonia. Right hilar lymph node measures 1.4 cm, image 34/series 2. Peripheral nodule within the right lower lobe measured 8 mm, image 42/series 3. There is a right paratracheal lymph node which measures 1.2 cm, image 15/series 2. The sub- carinal lymph node measures 1.1 cm.  The heart size appears normal. No pericardial effusion. Mild calcified atherosclerotic disease involves the thoracic aorta.  No axillary or supraclavicular adenopathy identified.  Incidental imaging through the upper abdomen shows no acute findings. There is a small low attenuation structure along the dome of the liver measuring 8 mm, image 43/series  2.  Review of the visualized osseous structures is on unremarkable. No aggressive lytic or sclerotic bone lesions identified.  IMPRESSION: 1. Bilateral lower lobe infiltrates. In the acute setting these likely represent bronchopneumonia. This will need close interval followup to ensure complete resolution. If this does not resolve then bronchoscopy would be advised to assess for underlying neoplasm. 2. Enlarged right hilar, sub- carinal and right paratracheal lymph nodes. In the setting of pneumonia these may represent reactive adenopathy. Given the patient's risk factors for lung cancer these will need close interval monitoring to ensure resolution and to rule out metastatic adenopathy. These results will be called to the ordering clinician or representative by the Radiologist Assistant, and communication documented in the PACS Dashboard.   Electronically Signed   By: Signa Kell M.D.   On: 04/11/2013 10:26   Dg Chest Port 1 View  04/12/2013   CLINICAL DATA:  Chest pain with cough and dyspnea, previous history of bronchitis  EXAM: PORTABLE CHEST - 1 VIEW  COMPARISON:  CT CHEST W/CM dated 04/11/2013; DG CHEST 2V dated 04/09/2013  FINDINGS: The lungs are adequately inflated. There are increased lung markings in the left infrahilar region and at the right lung base. The right hemidiaphragm is partially obscured today. The cardiopericardial silhouette is mildly enlarged though stable. The pulmonary vascularity is not engorged.  IMPRESSION: Basilar atelectasis or pneumonia on the right and infrahilar atelectasis on the left is present. These findings are more conspicuous than on the earlier study.   Electronically Signed   By: David  [place]   On: 04/12/2013 11:55    Jeoffrey Massed, MD  [facility] Pager:[phone]  If 7PM-7AM, please contact night-coverage [email or web address] Password [facility] 04/13/2013, 10:55 AM   LOS: 1 day

## 2013-04-14 LAB — LEGIONELLA ANTIGEN, URINE: LEGIONELLA ANTIGEN, URINE: NEGATIVE

## 2013-04-14 LAB — GLUCOSE, CAPILLARY: Glucose-Capillary: 131 mg/dL — ABNORMAL HIGH (ref 70–99)

## 2013-04-14 MED ORDER — METHYLPREDNISOLONE SODIUM SUCC 40 MG IJ SOLR
40.0000 mg | Freq: Two times a day (BID) | INTRAMUSCULAR | Status: DC
  Administered 2013-04-14 – 2013-04-15 (×2): 40 mg via INTRAVENOUS
  Filled 2013-04-14 (×4): qty 1

## 2013-04-14 MED ORDER — SENNOSIDES-DOCUSATE SODIUM 8.6-50 MG PO TABS
2.0000 | ORAL_TABLET | Freq: Once | ORAL | Status: AC
  Administered 2013-04-14: 2 via ORAL
  Filled 2013-04-14: qty 2

## 2013-04-14 MED ORDER — BUDESONIDE 0.25 MG/2ML IN SUSP
0.2500 mg | Freq: Two times a day (BID) | RESPIRATORY_TRACT | Status: DC
  Administered 2013-04-14 – 2013-04-15 (×3): 0.25 mg via RESPIRATORY_TRACT
  Filled 2013-04-14 (×5): qty 2

## 2013-04-14 MED ORDER — SENNA 8.6 MG PO TABS
2.0000 | ORAL_TABLET | Freq: Every day | ORAL | Status: DC | PRN
  Filled 2013-04-14: qty 2

## 2013-04-14 NOTE — Progress Notes (Signed)
PATIENT DETAILS Name: [name] Age: 58 y.o. Sex: male Date of Birth: 1955/12/23 Admit Date: 04/12/2013 Admitting Physician Evalee Mutton Kristeen Mans, MD [id number], MD  Subjective: Patient states he is better than yesterday. He is breathing comfortably and his SOB, cough and headache are improved.  Assessment/Plan:  Principal Problem: PNA (pneumonia) -Patient admitted with several weeks history of cough and shortness of breath. -He has been having these problems intermittently since October. He had seen his primary care practitioner a few days prior to this admission and was started on Levaquin which she had taken for 3 days. - Outpatient CT of the chest showed bilateral pneumonia and mediastinal lymphadenopathy, as a result patient was directly admitted. - On admission, patient was started on Rocephin and Zithromax (day 3), IV Solu-Medrol, nebulized bronchodilators and other antitussives. -He is improved since admission, and feels better than yesterday. -Cough is decreased today, was able to sleep last night. He remains afebrile and without any leukocytosis. - Respiratory virus panel is positive for respiratory syncytial virus and metapneumonia virus.  Active Problems: Persistent cough -Likely secondary to above. Suspect patient has some component of reactive airway disease as well. -Improved today -Continue antibiotics to treat above, continue nebulized bronchodilators and steroids-but taper solumedrol. -Continue with Claritin, PPI, antitussives and mucolytics.  ? Asthma exacerbation -Suspect some amount of reactive airway disease ,patient was wheezing on exam on admission.  -Patient was started on steroids, and scheduled nebulized bronchodilators. -He has improved since yesterday with less rhonchi on exam. -Continue steroids, nebulized bronchodilators, incentive spirometry and flutter valve. -Will likely need a pulmonary function test to be done in the outpatient  setting   Mediastinal lymphadenopathy  -Likely reactive in a setting of infiltrates.  -Treat pneumonia, and repeat CT chest in the next 2-3 months, if still persistent then consider further workup at that point.   Bipolar Disorder  -Continue home medications   Polysubstance Abuse  -Hx of 20 years of mainly alcohol abuse -No signs of any alcohol withdrawal currently -Claims he has not had a drink in years  Disposition: Remain inpatient-suspect home 2/28   DVT Prophylaxis: Prophylactic Lovenox  Code Status: Full code  Family Communication Spouse on admission  Procedures:  2D Echocardiogram  CONSULTS:  None  MEDICATIONS: Scheduled Meds: . azithromycin  500 mg Intravenous Q24H  . budesonide (PULMICORT) nebulizer solution  0.25 mg Nebulization BID  . cefTRIAXone (ROCEPHIN)  IV  1 g Intravenous Q24H  . enoxaparin (LOVENOX) injection  40 mg Subcutaneous Q24H  . fluticasone  2 spray Each Nare Daily  . guaiFENesin  1,200 mg Oral BID  . ipratropium-albuterol  3 mL Nebulization TID  . lamoTRIgine  150 mg Oral QHS  . loratadine  10 mg Oral Daily  . methylPREDNISolone (SOLU-MEDROL) injection  60 mg Intravenous 3 times per day  . omega-3 acid ethyl esters  4 g Oral Daily  . pantoprazole  40 mg Oral Q1200  . senna-docusate  2 tablet Oral Once  . sodium chloride  3 mL Intravenous Q12H  . Vilazodone HCl  30 mg Oral Daily   Continuous Infusions:  PRN Meds:.sodium chloride, acetaminophen, acetaminophen, albuterol, diphenhydrAMINE, morphine injection, ondansetron (ZOFRAN) IV, ondansetron, oxyCODONE, promethazine-codeine, sodium chloride, traZODone  Antibiotics: Anti-infectives   Start     Dose/Rate Route Frequency Ordered Stop   04/12/13 1500  cefTRIAXone (ROCEPHIN) 1 g in dextrose 5 % 50 mL IVPB     1 g 100 mL/hr over 30 Minutes Intravenous Every  24 hours 04/12/13 1413     04/12/13 1500  azithromycin (ZITHROMAX) 500 mg in dextrose 5 % 250 mL IVPB     500 mg 250 mL/hr over  60 Minutes Intravenous Every 24 hours 04/12/13 1413        PHYSICAL EXAM: Vital signs in last 24 hours: Filed Vitals:   04/13/13 1403 04/13/13 2103 04/14/13 0549 04/14/13 0737  BP: 161/79 119/74 133/84   Pulse: 89 97 61   Temp: 97.6 F (36.4 C) 97.8 F (36.6 C) 97.5 F (36.4 C)   TempSrc: Oral Oral Oral   Resp: 20 20 18    Height:      Weight:      SpO2: 93% 92% 93% 95%    Weight change:  Filed Weights   04/12/13 1125  Weight: 108.863 kg (240 lb)   Body mass index is 34.44 kg/(m^2).   Gen Exam: Patient sitting comfortably in NAD, appears stated age.  Neck: Supple, No JVD or adenopathy.   Chest: Good air movement bilaterally,mostly clear today with only a few scattered rhonchi CVS: S1 S2 Regular, no murmurs rubs or gallops.  Abdomen: Soft, BS +, non tender, non distended.  Extremities: no edema, lower extremities warm to touch. Neurologic: AAO x3, equal strength in extremities bilaterally.   Skin: No Rash or cyanosis.    Intake/Output from previous day:  Intake/Output Summary (Last 24 hours) at 04/14/13 0928 Last data filed at 04/14/13 0900  Gross per 24 hour  Intake    203 ml  Output      0 ml  Net    203 ml   LAB RESULTS: CBC  Recent Labs Lab 04/12/13 1130 04/12/13 1430 04/13/13 0424  WBC 8.5 9.7 6.8  HGB 15.1 15.2 14.3  HCT 42.5 43.3 40.1  PLT 241 241 243  MCV 89.5 89.3 88.1  MCH 31.8 31.3 31.4  MCHC 35.5 35.1 35.7  RDW 12.0 12.0 11.9  LYMPHSABS 1.5  --   --   MONOABS 1.1*  --   --   EOSABS 0.1  --   --   BASOSABS 0.0  --   --    Chemistries   Recent Labs Lab 04/12/13 1130 04/12/13 1430 04/13/13 0424  NA 132*  --  130*  K 4.7  --  4.8  CL 92*  --  92*  CO2 27  --  25  GLUCOSE 97  --  142*  BUN 13  --  13  CREATININE 0.98 0.97 0.78  CALCIUM 9.1  --  9.3   CBG:  Recent Labs Lab 04/14/13 0547  GLUCAP 131*    GFR Estimated Creatinine Clearance: 122.9 ml/min (by C-G formula based on Cr of 0.78).  MICROBIOLOGY: Recent Results  (from the past 240 hour(s))  RESPIRATORY VIRUS PANEL     Status: Abnormal   Collection Time    04/12/13  5:50 PM      Result Value Ref Range Status   Source - [facility]AN NASAL SWAB   Corrected   Comment: CORRECTED ON 02/26 AT 2112: PREVIOUSLY REPORTED AS NASAL SWAB   Respiratory Syncytial Virus A NOT DETECTED   Final   Respiratory Syncytial Virus B DETECTED (*)  Final   Influenza A NOT DETECTED   Final   Influenza B NOT DETECTED   Final   Parainfluenza 1 NOT DETECTED   Final   Parainfluenza 2 NOT DETECTED   Final   Parainfluenza 3 NOT DETECTED   Final   Metapneumovirus DETECTED (*)  Final   Rhinovirus NOT DETECTED   Final   Adenovirus NOT DETECTED   Final   Influenza A H1 NOT DETECTED   Final   Influenza A H3 NOT DETECTED   Final   Comment: (NOTE)           Normal Reference Range for each Analyte: NOT DETECTED     Testing performed using the Lumine[employer] xTAG Respiratory Viral Panel test     kit.     This test was developed and its performance characteristics determined     by [employer]. It has not been cleared or approved by the [place]     Food and Drug Administration. This test is used for clinical purposes.     It should not be regarded as investigational or for research. This     laboratory is certified under the [place] (CLIA) as qualified to perform high complexity     clinical laboratory testing.     Performed at [facility] STUDIES/RESULTS: Ct Chest W Contrast  04/11/2013   CLINICAL DATA:  Evaluate right lung base pneumonia  EXAM: CT CHEST WITH CONTRAST  TECHNIQUE: Multidetector CT imaging of the chest was performed during intravenous contrast administration.  CONTRAST:  75 cc of Omni 300  COMPARISON:  04/09/2013  FINDINGS: There is no pleural effusion identified. Bilateral lower lobe infiltrates are identified compatible with multifocal pneumonia. Right hilar lymph node measures 1.4 cm, image 34/series 2.  Peripheral nodule within the right lower lobe measured 8 mm, image 42/series 3. There is a right paratracheal lymph node which measures 1.2 cm, image 15/series 2. The sub- carinal lymph node measures 1.1 cm.  The heart size appears normal. No pericardial effusion. Mild calcified atherosclerotic disease involves the thoracic aorta.  No axillary or supraclavicular adenopathy identified.  Incidental imaging through the upper abdomen shows no acute findings. There is a small low attenuation structure along the dome of the liver measuring 8 mm, image 43/series 2.  Review of the visualized osseous structures is on unremarkable. No aggressive lytic or sclerotic bone lesions identified.  IMPRESSION: 1. Bilateral lower lobe infiltrates. In the acute setting these likely represent bronchopneumonia. This will need close interval followup to ensure complete resolution. If this does not resolve then bronchoscopy would be advised to assess for underlying neoplasm. 2. Enlarged right hilar, sub- carinal and right paratracheal lymph nodes. In the setting of pneumonia these may represent reactive adenopathy. Given the patient's risk factors for lung cancer these will need close interval monitoring to ensure resolution and to rule out metastatic adenopathy. These results will be called to the ordering clinician or representative by the Radiologist Assistant, and communication documented in the PACS Dashboard.   Electronically Signed   By: Kerby Moors M.D.   On: 04/11/2013 10:26   Dg Chest Port 1 View  04/12/2013   CLINICAL DATA:  Chest pain with cough and dyspnea, previous history of bronchitis  EXAM: PORTABLE CHEST - 1 VIEW  COMPARISON:  CT CHEST W/CM dated 04/11/2013; DG CHEST 2V dated 04/09/2013  FINDINGS: The lungs are adequately inflated. There are increased lung markings in the left infrahilar region and at the right lung base. The right hemidiaphragm is partially obscured today. The cardiopericardial silhouette is mildly  enlarged though stable. The pulmonary vascularity is not engorged.  IMPRESSION: Basilar atelectasis or pneumonia on the right and infrahilar atelectasis on the left is present. These findings  are more conspicuous than on the earlier study.   Electronically Signed   By: David  Martin[place]   On: 04/12/2013 11:55    Ruben Im, MD  [facility] Pager:[phone]  If 7PM-7AM, please contact night-coverage [email or web address] Password [email or web address] 04/14/2013, 9:28 AM   LOS: 3 days    Attending Patient seen and examined, agree with the above assessment and plan. Documentation was reviewed, necessary changes have been made. Continues to improve, less cough, lungs sound of a lot better. Respiratory virus panel positive for the above-noted viruses. We'll continue empiric treatment with Rocephin and Zithromax, suspect that if clinical improvement continues, he should be able to go home on 2/28.  Nena Alexander MD

## 2013-04-15 LAB — GLUCOSE, CAPILLARY: GLUCOSE-CAPILLARY: 91 mg/dL (ref 70–99)

## 2013-04-15 MED ORDER — TRAMADOL HCL 50 MG PO TABS: 50.0000 mg | ORAL_TABLET | Freq: Three times a day (TID) | ORAL | Status: DC | PRN

## 2013-04-15 MED ORDER — GUAIFENESIN ER 600 MG PO TB12: 1200.0000 mg | ORAL_TABLET | Freq: Two times a day (BID) | ORAL | Status: DC

## 2013-04-15 MED ORDER — ALBUTEROL SULFATE HFA 108 (90 BASE) MCG/ACT IN AERS: 2.0000 | INHALATION_SPRAY | RESPIRATORY_TRACT | Status: DC | PRN

## 2013-04-15 MED ORDER — SENNA 8.6 MG PO TABS: 2.0000 | ORAL_TABLET | Freq: Every day | ORAL | Status: DC | PRN

## 2013-04-15 MED ORDER — FLUTICASONE PROPIONATE 50 MCG/ACT NA SUSP: 2.0000 | Freq: Every day | NASAL | Status: DC

## 2013-04-15 MED ORDER — FAMOTIDINE 20 MG PO TABS: 20.0000 mg | ORAL_TABLET | Freq: Every day | ORAL | Status: DC

## 2013-04-15 MED ORDER — PROMETHAZINE-CODEINE 6.25-10 MG/5ML PO SYRP: 5.0000 mL | ORAL_SOLUTION | Freq: Four times a day (QID) | ORAL | Status: DC | PRN

## 2013-04-15 MED ORDER — PANTOPRAZOLE SODIUM 40 MG PO TBEC: 40.0000 mg | DELAYED_RELEASE_TABLET | Freq: Every morning | ORAL | Status: DC

## 2013-04-15 MED ORDER — BENZONATATE 200 MG PO CAPS: 200.0000 mg | ORAL_CAPSULE | Freq: Three times a day (TID) | ORAL | Status: DC

## 2013-04-15 MED ORDER — CHLORPHENIRAMINE MALEATE 4 MG PO TABS: 4.0000 mg | ORAL_TABLET | Freq: Three times a day (TID) | ORAL | Status: DC

## 2013-04-15 MED ORDER — BENZONATATE 100 MG PO CAPS
200.0000 mg | ORAL_CAPSULE | Freq: Three times a day (TID) | ORAL | Status: DC
  Administered 2013-04-15: 200 mg via ORAL
  Filled 2013-04-15 (×3): qty 2

## 2013-04-15 MED ORDER — LEVOFLOXACIN 750 MG PO TABS: 750.0000 mg | ORAL_TABLET | Freq: Every day | ORAL | Status: DC

## 2013-04-15 MED ORDER — BUDESONIDE-FORMOTEROL FUMARATE 160-4.5 MCG/ACT IN AERO: 2.0000 | INHALATION_SPRAY | Freq: Two times a day (BID) | RESPIRATORY_TRACT | Status: DC

## 2013-04-15 NOTE — Discharge Summary (Signed)
PATIENT DETAILS Name: [name] Age: 58 y.o. Sex: male Date of Birth: Nov 06, 1955 MRN: [id number]. Admit Date: 04/12/2013 Admitting Physician: Jonetta Osgood, MD LYH:TMBP,JPETKKOEC, MD  Recommendations for Outpatient Follow-up:  1. Likely will need repeat CT of the chest in the next 2-3 months to see if mediastinal lymphadenopathy has resolved. 2. Likely will need pulmonary function tests at some point.  PRIMARY DISCHARGE DIAGNOSIS:  Principal Problem:   PNA (pneumonia) Active Problems:   Bipolar affective disorder, depressed, moderate   Mediastinal lymphadenopathy   Shortness of breath      PAST MEDICAL HISTORY: Past Medical History  Diagnosis Date  . Substance abuse   . Anxiety   . Mental disorder     ADD, bipolar  . Depression   . Pneumonia 04/12/2013    DISCHARGE MEDICATIONS:   Medication List    STOP taking these medications       HYDROcodone-homatropine 5-1.5 MG/5ML syrup  Commonly known as:  HYCODAN      TAKE these medications       albuterol 108 (90 BASE) MCG/ACT inhaler  Commonly known as:  PROVENTIL HFA;VENTOLIN HFA  Inhale 2 puffs into the lungs every 4 (four) hours as needed for wheezing or shortness of breath (COUGH).     benzonatate 200 MG capsule  Commonly known as:  TESSALON  Take 1 capsule (200 mg total) by mouth 3 (three) times daily.     budesonide-formoterol 160-4.5 MCG/ACT inhaler  Commonly known as:  SYMBICORT  Inhale 2 puffs into the lungs 2 times daily at 12 noon and 4 pm.     chlorpheniramine 4 MG tablet  Commonly known as:  CHLOR-TRIMETON  Take 1 tablet (4 mg total) by mouth 3 (three) times daily.     famotidine 20 MG tablet  Commonly known as:  PEPCID  Take 1 tablet (20 mg total) by mouth at bedtime.     fluticasone 50 MCG/ACT nasal spray  Commonly known as:  FLONASE  Place 2 sprays into both nostrils daily.     guaiFENesin 600 MG 12 hr tablet  Commonly known as:  MUCINEX  Take 2 tablets (1,200 mg total) by mouth  2 (two) times daily.     lamoTRIgine 150 MG tablet  Commonly known as:  LAMICTAL  Take 150 mg by mouth at bedtime. Mood /depression     levofloxacin 750 MG tablet  Commonly known as:  LEVAQUIN  Take 1 tablet (750 mg total) by mouth daily. X 4 days from 04/15/13.     omega-3 acid ethyl esters 1 G capsule  Commonly known as:  LOVAZA  Take 4 g by mouth daily.     pantoprazole 40 MG tablet  Commonly known as:  PROTONIX  Take 1 tablet (40 mg total) by mouth every morning.     promethazine-codeine 6.25-10 MG/5ML syrup  Commonly known as:  PHENERGAN with CODEINE  Take 5 mLs by mouth every 6 (six) hours as needed for cough.     senna 8.6 MG Tabs tablet  Commonly known as:  SENOKOT  Take 2 tablets (17.2 mg total) by mouth daily as needed for mild constipation.     traMADol 50 MG tablet  Commonly known as:  ULTRAM  Take 1 tablet (50 mg total) by mouth 3 (three) times daily as needed for moderate pain.     traZODone 50 MG tablet  Commonly known as:  DESYREL  Take 50 mg by mouth at bedtime as needed for sleep.  VIIBRYD 10 & 20 & 40 MG Kit  Generic drug:  Vilazodone HCl  Take 30 mg by mouth daily.        ALLERGIES:  No Known Allergies  BRIEF HPI:  See H&P, Labs, Consult and Test reports for all details in brief, patient was admitted for several weeks of cough and shortness of breath. In the outpatient setting, patient had a CT chest that showed bilateral pneumonia and mediastinal lymphadenopathy, as a result the patient was admitted to the hospitalist service. Upon further discussion with the patient, he apparently has been having spells of cough intermittently since October of 2014.  CONSULTATIONS:   None  PERTINENT RADIOLOGIC STUDIES: Ct Chest W Contrast  04/11/2013   CLINICAL DATA:  Evaluate right lung base pneumonia  EXAM: CT CHEST WITH CONTRAST  TECHNIQUE: Multidetector CT imaging of the chest was performed during intravenous contrast administration.  CONTRAST:  75 cc of  Omni 300  COMPARISON:  04/09/2013  FINDINGS: There is no pleural effusion identified. Bilateral lower lobe infiltrates are identified compatible with multifocal pneumonia. Right hilar lymph node measures 1.4 cm, image 34/series 2. Peripheral nodule within the right lower lobe measured 8 mm, image 42/series 3. There is a right paratracheal lymph node which measures 1.2 cm, image 15/series 2. The sub- carinal lymph node measures 1.1 cm.  The heart size appears normal. No pericardial effusion. Mild calcified atherosclerotic disease involves the thoracic aorta.  No axillary or supraclavicular adenopathy identified.  Incidental imaging through the upper abdomen shows no acute findings. There is a small low attenuation structure along the dome of the liver measuring 8 mm, image 43/series 2.  Review of the visualized osseous structures is on unremarkable. No aggressive lytic or sclerotic bone lesions identified.  IMPRESSION: 1. Bilateral lower lobe infiltrates. In the acute setting these likely represent bronchopneumonia. This will need close interval followup to ensure complete resolution. If this does not resolve then bronchoscopy would be advised to assess for underlying neoplasm. 2. Enlarged right hilar, sub- carinal and right paratracheal lymph nodes. In the setting of pneumonia these may represent reactive adenopathy. Given the patient's risk factors for lung cancer these will need close interval monitoring to ensure resolution and to rule out metastatic adenopathy. These results will be called to the ordering clinician or representative by the Radiologist Assistant, and communication documented in the PACS Dashboard.   Electronically Signed   By: Kerby Moors M.D.   On: 04/11/2013 10:26   Dg Chest Port 1 View  04/12/2013   CLINICAL DATA:  Chest pain with cough and dyspnea, previous history of bronchitis  EXAM: PORTABLE CHEST - 1 VIEW  COMPARISON:  CT CHEST W/CM dated 04/11/2013; DG CHEST 2V dated 04/09/2013   FINDINGS: The lungs are adequately inflated. There are increased lung markings in the left infrahilar region and at the right lung base. The right hemidiaphragm is partially obscured today. The cardiopericardial silhouette is mildly enlarged though stable. The pulmonary vascularity is not engorged.  IMPRESSION: Basilar atelectasis or pneumonia on the right and infrahilar atelectasis on the left is present. These findings are more conspicuous than on the earlier study.   Electronically Signed   By: David  Martinique   On: 04/12/2013 11:55     PERTINENT LAB RESULTS: CBC:  Recent Labs  04/12/13 1430 04/13/13 0424  WBC 9.7 6.8  HGB 15.2 14.3  HCT 43.3 40.1  PLT 241 243   CMET CMP     Component Value Date/Time   NA 130*  04/13/2013 0424   K 4.8 04/13/2013 0424   CL 92* 04/13/2013 0424   CO2 25 04/13/2013 0424   GLUCOSE 142* 04/13/2013 0424   BUN 13 04/13/2013 0424   CREATININE 0.78 04/13/2013 0424   CALCIUM 9.3 04/13/2013 0424   PROT 7.6 04/12/2013 1130   ALBUMIN 4.1 04/12/2013 1130   AST 24 04/12/2013 1130   ALT 18 04/12/2013 1130   ALKPHOS 128* 04/12/2013 1130   BILITOT 0.9 04/12/2013 1130   GFRNONAA >90 04/13/2013 0424   GFRAA >90 04/13/2013 0424    GFR Estimated Creatinine Clearance: 122.9 ml/min (by C-G formula based on Cr of 0.78). No results found for this basename: LIPASE, AMYLASE,  in the last 72 hours No results found for this basename: CKTOTAL, CKMB, CKMBINDEX, TROPONINI,  in the last 72 hours No components found with this basename: POCBNP,  No results found for this basename: DDIMER,  in the last 72 hours No results found for this basename: HGBA1C,  in the last 72 hours No results found for this basename: CHOL, HDL, LDLCALC, TRIG, CHOLHDL, LDLDIRECT,  in the last 72 hours No results found for this basename: TSH, T4TOTAL, FREET3, T3FREE, THYROIDAB,  in the last 72 hours No results found for this basename: VITAMINB12, FOLATE, FERRITIN, TIBC, IRON, RETICCTPCT,  in the last 72  hours Coags: No results found for this basename: PT, INR,  in the last 72 hours Microbiology: Recent Results (from the past 240 hour(s))  RESPIRATORY VIRUS PANEL     Status: Abnormal   Collection Time    04/12/13  5:50 PM      Result Value Ref Range Status   Source - RVPAN NASAL SWAB   Corrected   Comment: CORRECTED ON 02/26 AT 2112: PREVIOUSLY REPORTED AS NASAL SWAB   Respiratory Syncytial Virus A NOT DETECTED   Final   Respiratory Syncytial Virus B DETECTED (*)  Final   Influenza A NOT DETECTED   Final   Influenza B NOT DETECTED   Final   Parainfluenza 1 NOT DETECTED   Final   Parainfluenza 2 NOT DETECTED   Final   Parainfluenza 3 NOT DETECTED   Final   Metapneumovirus DETECTED (*)  Final   Rhinovirus NOT DETECTED   Final   Adenovirus NOT DETECTED   Final   Influenza A H1 NOT DETECTED   Final   Influenza A H3 NOT DETECTED   Final   Comment: (NOTE)           Normal Reference Range for each Analyte: NOT DETECTED     Testing performed using the Luminex xTAG Respiratory Viral Panel test     kit.     This test was developed and its performance characteristics determined     by [employer]. It has not been cleared or approved by the [place]     Food and Drug Administration. This test is used for clinical purposes.     It should not be regarded as investigational or for research. This     laboratory is certified under the [place] (CLIA) as qualified to perform high complexity     clinical laboratory testing.     Performed at [facility]:  PNA (pneumonia)  - Patient was admitted with several weeks history of cough and shortness of breath. He has been having these problems intermittently since October. He had seen his primary care practitioner a few  days prior to this admission and was started on Levaquin which she had taken for 3 days. A. outpatient CT of the chest showed bilateral pneumonia and  mediastinal lymphadenopathy, as a result patient was directly admitted.  - On admission, patient was started on Rocephin and Zithromax (day 4 on 2/28), IV Solu-Medrol, nebulized bronchodilators and other antitussives medications. A respiratory virus panel was sent, which was positive for respiratory syncytial virus and metapneumonia virus. -He has improved somewhat compared to admission, says the cough has decreased slightly, but unfortunately still continues. He has remained afebrile and without leukocytosis during his stay. His clinically improved, he will be discharged home in a stable condition with the above-noted medications. Appointment with pulmonology-Dr. Elsworth Soho has been scheduled for 3/2 at 57 AM for further continued workup in the outpatient setting  Persistent cough - Likely secondary to above. However patient has had intermittent coughing spells/periods since October. Suspect some component of reactive airway disease. - As noted above, much better than on admission, however cough continues. - He will be continued on antibiotics, antitussive medications, Pepcid and Protonix for antireflux therapy, Flonase and antihistamine. He was also be discharged as needed tramadol. - As noted above, an appointment with pulmonology has been scheduled for further continued workup in the outpatient setting. Suspect he will likely need pulmonary function tests to be done.  Mediastinal lymphadenopathy  -Likely reactive in a setting of infiltrates.  -Treat pneumonia, and repeat CT chest in the next 2-3 months, if still persistent then consider further workup at that point. He would be seeing pulmonology-Dr. Elsworth Soho, we'll defer this to be done in the outpatient setting.  Bipolar Disorder  -Continue home medications   Polysubstance Abuse  -Hx of 20 years of mainly alcohol abuse  -No signs of any alcohol withdrawal currently  -Claims he has not had a drink in years   TODAY-DAY OF DISCHARGE:  Subjective:     [name] today has no headache,no chest abdominal pain,no new weakness tingling or numbness, feels much better wants to go home today.   Objective:   Blood pressure 138/94, pulse 65, temperature 97.6 F (36.4 C), temperature source Oral, resp. rate 18, height _0  (1.778 m), weight 108.863 kg (240 lb), SpO2 94.00%.  Intake/Output Summary (Last 24 hours) at 04/15/13 0946 Last data filed at 04/15/13 0919  Gross per 24 hour  Intake   1058 ml  Output      0 ml  Net   1058 ml   Filed Weights   04/12/13 1125  Weight: 108.863 kg (240 lb)    Exam Awake Alert, Oriented *3, No new F.N deficits, Normal affect [place].AT,PERRAL Supple Neck,No JVD, No cervical lymphadenopathy appriciated.  Symmetrical Chest wall movement, Good air movement bilaterally, CTAB RRR,No Gallops,Rubs or new Murmurs, No Parasternal Heave +ve B.Sounds, Abd Soft, Non tender, No organomegaly appriciated, No rebound -guarding or rigidity. No Cyanosis, Clubbing or edema, No new Rash or bruise  DISCHARGE CONDITION: Stable  DISPOSITION: Home  DISCHARGE INSTRUCTIONS:    Activity:  As tolerated   Diet recommendation: Regular Diet  Discharge Orders   Future Appointments Provider Department Dept Phone   04/17/2013 11:00 AM Rigoberto Noel, MD [facility] [phone]   Future Orders Complete By Expires   Call MD for:  difficulty breathing, headache or visual disturbances  As directed    Call MD for:  temperature >100.4  As directed    Diet general  As directed    Discharge instructions  As directed  Comments:     Repeat CT of the chest in the next 2-3 months, to see if enlarged lymph nodes in the mediastinum have resolved.   Increase activity slowly  As directed       Follow-up Information   Follow up with Rigoberto Noel., MD On 04/17/2013. (at 11 am)    Specialty:  Pulmonary Disease   Contact information:   [address] [place] [address] [phone]       Follow up with SHAW,KIMBERLEE,  MD. Schedule an appointment as soon as possible for a visit in 1 week.   Specialty:  Family Medicine   Contact information:   [address] [place] [address] [phone]       Total Time spent on discharge equals 45 minutes.  SignedOren Binet 04/15/2013 9:46 AM

## 2013-04-15 NOTE — Progress Notes (Signed)
[name] to be D/C'd Home per MD order.  Discussed with the patient and all questions fully answered.    Medication List    STOP taking these medications       HYDROcodone-homatropine 5-1.5 MG/5ML syrup  Commonly known as:  HYCODAN      TAKE these medications       albuterol 108 (90 BASE) MCG/ACT inhaler  Commonly known as:  PROVENTIL HFA;VENTOLIN HFA  Inhale 2 puffs into the lungs every 4 (four) hours as needed for wheezing or shortness of breath (COUGH).     benzonatate 200 MG capsule  Commonly known as:  TESSALON  Take 1 capsule (200 mg total) by mouth 3 (three) times daily.     budesonide-formoterol 160-4.5 MCG/ACT inhaler  Commonly known as:  SYMBICORT  Inhale 2 puffs into the lungs 2 times daily at 12 noon and 4 pm.     chlorpheniramine 4 MG tablet  Commonly known as:  CHLOR-TRIMETON  Take 1 tablet (4 mg total) by mouth 3 (three) times daily.     famotidine 20 MG tablet  Commonly known as:  PEPCID  Take 1 tablet (20 mg total) by mouth at bedtime.     fluticasone 50 MCG/ACT nasal spray  Commonly known as:  FLONASE  Place 2 sprays into both nostrils daily.     guaiFENesin 600 MG 12 hr tablet  Commonly known as:  MUCINEX  Take 2 tablets (1,200 mg total) by mouth 2 (two) times daily.     lamoTRIgine 150 MG tablet  Commonly known as:  LAMICTAL  Take 150 mg by mouth at bedtime. Mood /depression     levofloxacin 750 MG tablet  Commonly known as:  LEVAQUIN  Take 1 tablet (750 mg total) by mouth daily. X 4 days from 04/15/13.     omega-3 acid ethyl esters 1 G capsule  Commonly known as:  LOVAZA  Take 4 g by mouth daily.     pantoprazole 40 MG tablet  Commonly known as:  PROTONIX  Take 1 tablet (40 mg total) by mouth every morning.     promethazine-codeine 6.25-10 MG/5ML syrup  Commonly known as:  PHENERGAN with CODEINE  Take 5 mLs by mouth every 6 (six) hours as needed for cough.     senna 8.6 MG Tabs tablet  Commonly known as:  SENOKOT  Take 2 tablets  (17.2 mg total) by mouth daily as needed for mild constipation.     traMADol 50 MG tablet  Commonly known as:  ULTRAM  Take 1 tablet (50 mg total) by mouth 3 (three) times daily as needed for moderate pain (cough).     traZODone 50 MG tablet  Commonly known as:  DESYREL  Take 50 mg by mouth at bedtime as needed for sleep.     VIIBRYD 10 & 20 & 40 MG Kit  Generic drug:  Vilazodone HCl  Take 30 mg by mouth daily.        VVS, Skin clean, dry and intact without evidence of skin break down, no evidence of skin tears noted. IV catheter discontinued intact. Site without signs and symptoms of complications. Dressing and pressure applied.  An After Visit Summary was printed and given to the patient.  D/c education completed with patient/family including follow up instructions, medication list, d/c activities limitations if indicated, with other d/c instructions as indicated by MD - patient able to verbalize understanding, all questions fully answered.   Patient instructed to return to ED, call  911, or call MD for any changes in condition.   Patient escorted via RN, and D/C home via private auto.  Delman Cheadle 04/15/2013 11:44 AM

## 2013-04-15 NOTE — Discharge Instructions (Signed)
Repeat CT of the chest in the next 2-3 months, to see if enlarged lymph nodes in the mediastinum have resolved.

## 2013-04-17 ENCOUNTER — Ambulatory Visit (INDEPENDENT_AMBULATORY_CARE_PROVIDER_SITE_OTHER): Payer: 59 | Admitting: Pulmonary Disease

## 2013-04-17 ENCOUNTER — Encounter: Payer: Self-pay | Admitting: Pulmonary Disease

## 2013-04-17 VITALS — BP 126/84 | HR 79 | Temp 97.8°F | Ht 70.5 in | Wt 242.0 lb

## 2013-04-17 DIAGNOSIS — J189 Pneumonia, unspecified organism: Secondary | ICD-10-CM

## 2013-04-17 DIAGNOSIS — R599 Enlarged lymph nodes, unspecified: Secondary | ICD-10-CM

## 2013-04-17 DIAGNOSIS — R590 Localized enlarged lymph nodes: Secondary | ICD-10-CM

## 2013-04-17 MED ORDER — PREDNISONE 10 MG PO TABS: ORAL_TABLET | ORAL | Status: DC

## 2013-04-17 NOTE — Assessment & Plan Note (Signed)
Cough may be related to viral pneumonia or reflux Complete course of levaquin  CXR on FU visit Cough syrup as needed Stay on symbicort Prednisone 10 mg tabs  Take 2 tabs daily with food x 5ds, then 1 tab daily with food x 5ds then STOP Stay on pepcid nightly x 4-6 weeks

## 2013-04-17 NOTE — Patient Instructions (Addendum)
Cough may be related to viral pneumonia or reflux Complete course of levaquin  CXR on FU visit Cough syrup as needed Stay on symbicort Prednisone 10 mg tabs  Take 2 tabs daily with food x 5ds, then 1 tab daily with food x 5ds then STOP Stay on pepcid nightly x 4-6 weeks We will repeat CT scan in 6-8 weeks for lymph nodes

## 2013-04-17 NOTE — Care Management Note (Signed)
    Page 1 of 1   04/17/2013     5:59:26 PM   CARE MANAGEMENT NOTE 04/17/2013  Patient:  [name],[name]   Account Number:  [id number]  Date Initiated:  04/17/2013  Documentation initiated by:  Letha CapeAYLOR,Thurma Priego  Subjective/Objective Assessment:   dx pna     Action/Plan:   Anticipated DC Date:  04/15/2013   Anticipated DC Plan:  HOME/SELF CARE      DC Planning Services  CM consult      Choice offered to / List presented to:             Status of service:  Completed, signed off Medicare Important Message given?   (If response is "NO", the following Medicare IM given date fields will be blank) Date Medicare IM given:   Date Additional Medicare IM given:    Discharge Disposition:  HOME/SELF CARE  Per UR Regulation:  Reviewed for med. necessity/level of care/duration of stay  If discussed at Long Length of Stay Meetings, dates discussed:    Comments:

## 2013-04-17 NOTE — Progress Notes (Signed)
Subjective:    Patient ID: [name], male    DOB: 09-Nov-1955, 58 y.o.   MRN: [id number]  HPI PCP - K shaw  58 year old remote smoker ( quit 95) with bipolar disorder presents for evaluation of chronic cough and abnormal imaging. He reports several weeks  of cough and shortness of breath, intermittently since October. He would get relief for a few weeks and symptoms would start again. His current bout was ongoing for 2 weeks, PCP started  Levaquin which he took for 3 days. A. outpatient CT of the chest showed bilateral pneumonia and mediastinal lymphadenopathy, as a result patient was directly admitted.  He was treated with Rocephin and Zithromax , IV Solu-Medrol, nebulized bronchodilators and other antitussives. Tessalon was not helpful. A respiratory virus panel  was positive for respiratory syncytial virus and metapneumovirus On my review, chest x-ray from 2/22 suggests a right lower lobe infiltrate on lateral view. CT chest from 2/24  Bilateral lower lobe infiltrates compatible with multifocal pneumonia. Enlarged mediastinal lymph nodes were noted  and a right lower lobe nodule 8mm. He smoked about 20-30 pack years before quitting in 1995. Is coughing is improved about 50% He works as a Oceanographer. He denies postnasal drip but admits to occasional reflux depending on kind of food intake, as a child history of asthma   Past Medical History  Diagnosis Date  . Substance abuse   . Anxiety   . Mental disorder     ADD, bipolar  . Depression   . Pneumonia 04/12/2013   Past Surgical History  Procedure Laterality Date  . Appendectomy    . Colonoscopy      No Known Allergies  History   Social History  . Marital Status: Married    Spouse Name: N/A    Number of Children: N/A  . Years of Education: N/A   Occupational History  . Not on file.   Social History Main Topics  . Smoking status: Former Smoker -- 1.00 packs/day for 20 years    Types: Cigarettes    Quit date:  02/16/1993  . Smokeless tobacco: Never Used  . Alcohol Use: Yes     Comment: pint a day  . Drug Use: No  . Sexual Activity: Yes   Other Topics Concern  . Not on file   Social History Narrative  . No narrative on file    No family history on file.   Review of Systems  Constitutional: Negative for fever, chills, diaphoresis, activity change, appetite change, fatigue and unexpected weight change.  HENT: Negative for congestion, dental problem, ear discharge, ear pain, facial swelling, hearing loss, mouth sores, nosebleeds, postnasal drip, rhinorrhea, sinus pressure, sneezing, sore throat, tinnitus, trouble swallowing and voice change.   Eyes: Negative for photophobia, discharge, itching and visual disturbance.  Respiratory: Positive for cough and shortness of breath. Negative for apnea, choking, chest tightness, wheezing and stridor.   Cardiovascular: Negative for chest pain, palpitations and leg swelling.  Gastrointestinal: Negative for nausea, vomiting, abdominal pain, constipation, blood in stool and abdominal distention.  Genitourinary: Negative for dysuria, urgency, frequency, hematuria, flank pain, decreased urine volume and difficulty urinating.  Musculoskeletal: Negative for arthralgias, back pain, gait problem, joint swelling, myalgias, neck pain and neck stiffness.  Skin: Negative for color change, pallor and rash.  Neurological: Negative for dizziness, tremors, seizures, syncope, speech difficulty, weakness, light-headedness, numbness and headaches.  Hematological: Negative for adenopathy. Does not bruise/bleed easily.  Psychiatric/Behavioral: Positive for dysphoric mood. Negative for confusion, sleep  disturbance and agitation. The patient is nervous/anxious.        Objective:   Physical Exam  Gen. Pleasant, well-nourished, in no distress, normal affect ENT - no lesions, no post nasal drip Neck: No JVD, no thyromegaly, no carotid bruits Lungs: no use of accessory  muscles, no dullness to percussion, BL exp rhonchi  Cardiovascular: Rhythm regular, heart sounds  normal, no murmurs or gallops, no peripheral edema Abdomen: soft and non-tender, no hepatosplenomegaly, BS normal. Musculoskeletal: No deformities, no cyanosis or clubbing Neuro:  alert, non focal       Assessment & Plan:

## 2013-04-17 NOTE — Assessment & Plan Note (Signed)
We will repeat CT scan in 6-8 weeks for lymph nodes

## 2013-05-08 ENCOUNTER — Encounter: Payer: Self-pay | Admitting: Pulmonary Disease

## 2013-05-08 ENCOUNTER — Ambulatory Visit (INDEPENDENT_AMBULATORY_CARE_PROVIDER_SITE_OTHER): Payer: 59 | Admitting: Pulmonary Disease

## 2013-05-08 ENCOUNTER — Ambulatory Visit
Admission: RE | Admit: 2013-05-08 | Discharge: 2013-05-08 | Disposition: A | Discharge status: Home / Self Care | Payer: 59 | Source: Ambulatory Visit | Attending: Pulmonary Disease | Admitting: Pulmonary Disease

## 2013-05-08 VITALS — BP 140/82 | HR 63 | Temp 98.0°F | Ht 71.0 in | Wt 246.0 lb

## 2013-05-08 DIAGNOSIS — J189 Pneumonia, unspecified organism: Secondary | ICD-10-CM

## 2013-05-08 DIAGNOSIS — R590 Localized enlarged lymph nodes: Secondary | ICD-10-CM

## 2013-05-08 DIAGNOSIS — R599 Enlarged lymph nodes, unspecified: Secondary | ICD-10-CM

## 2013-05-08 NOTE — Patient Instructions (Signed)
CXR tomorrow- pl call to check We will plan for FU CT chest in april

## 2013-05-08 NOTE — Progress Notes (Signed)
   Subjective:    Patient ID: [name], male    DOB: 11-28-1955, 58 y.o.   MRN: [id number]  HPI  PCP - K shaw   58 year old remote smoker ( quit 95) with bipolar disorder presents for FU of chronic cough and abnormal imaging.  He reports several weeks of cough and shortness of breath, intermittently since October. He would get relief for a few weeks and symptoms would start again. His current bout was ongoing for 2 weeks, PCP started Levaquin which he took for 3 days. A. outpatient CT of the chest showed bilateral pneumonia and mediastinal lymphadenopathy, as a result patient was directly admitted.  He was treated with Rocephin and Zithromax , IV Solu-Medrol, nebulized bronchodilators and other antitussives. Tessalon was not helpful.  A respiratory virus panel was positive for respiratory syncytial virus and metapneumovirus  Chest x-ray from 2/22 suggests a right lower lobe infiltrate on lateral view. CT chest from 2/24 Bilateral lower lobe infiltrates compatible with multifocal pneumonia.  Enlarged mediastinal lymph nodes were noted and a right lower lobe nodule 8mm.  He smoked about 20-30 pack years before quitting in 1995.  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 Follow-up    Reports still having sob with exertion, PND, and non-productive cough.  PNA x1 ago   CXR clear Much improved, almost back to baseline  Review of Systems neg for any significant sore throat, dysphagia, itching, sneezing, nasal congestion or excess/ purulent secretions, fever, chills, sweats, unintended wt loss, pleuritic or exertional cp, hempoptysis, orthopnea pnd or change in chronic leg swelling. Also denies presyncope, palpitations, heartburn, abdominal pain, nausea, vomiting, diarrhea or change in bowel or urinary habits, dysuria,hematuria, rash,  arthralgias, visual complaints, headache, numbness weakness or ataxia.      Objective:   Physical Exam  Gen. Pleasant, well-nourished, in no distress ENT - no lesions, no post nasal drip Neck: No JVD, no thyromegaly, no carotid bruits Lungs: no use of accessory muscles, no dullness to percussion, clear without rales or rhonchi  Cardiovascular: Rhythm regular, heart sounds  normal, no murmurs or gallops, no peripheral edema Musculoskeletal: No deformities, no cyanosis or clubbing        Assessment & Plan:

## 2013-05-12 ENCOUNTER — Other Ambulatory Visit: Payer: Self-pay | Admitting: Pulmonary Disease

## 2013-05-12 ENCOUNTER — Ambulatory Visit (INDEPENDENT_AMBULATORY_CARE_PROVIDER_SITE_OTHER)
Admission: RE | Admit: 2013-05-12 | Discharge: 2013-05-12 | Disposition: A | Discharge status: Home / Self Care | Payer: 59 | Source: Ambulatory Visit | Attending: Pulmonary Disease | Admitting: Pulmonary Disease

## 2013-05-12 DIAGNOSIS — J189 Pneumonia, unspecified organism: Secondary | ICD-10-CM

## 2013-05-13 NOTE — Assessment & Plan Note (Signed)
Appears resolved

## 2013-05-13 NOTE — Assessment & Plan Note (Signed)
FU CT chest in April -6-8 wks from last one

## 2013-05-29 ENCOUNTER — Ambulatory Visit
Admission: RE | Admit: 2013-05-29 | Discharge: 2013-05-29 | Disposition: A | Discharge status: Home / Self Care | Payer: 59 | Source: Ambulatory Visit | Attending: Pharmacist Clinician (PhC)/ Clinical Pharmacy Specialist | Admitting: Pharmacist Clinician (PhC)/ Clinical Pharmacy Specialist

## 2013-05-29 DIAGNOSIS — R599 Enlarged lymph nodes, unspecified: Secondary | ICD-10-CM

## 2013-05-29 MED ORDER — IOHEXOL 300 MG/ML  SOLN
75.0000 mL | Freq: Once | INTRAMUSCULAR | Status: AC | PRN
  Administered 2013-05-29: 75 mL via INTRAVENOUS

## 2013-05-31 ENCOUNTER — Telehealth: Payer: Self-pay | Admitting: Pulmonary Disease

## 2013-05-31 NOTE — Telephone Encounter (Signed)
Dr. Vassie LollAlva is on vacation for the next 2 weeks. Pt requesting CT results from 05/30/13. Please advise KC thanks

## 2013-05-31 NOTE — Telephone Encounter (Signed)
Pt aware of results. He had no questions. Will forward to RA for when he returns

## 2013-05-31 NOTE — Telephone Encounter (Signed)
Please let him know the ct looks better.  There is some scarring in bottom of right lung, his lymph nodes have gotten smaller, and his small lung spots are smaller as well. Please send this back to Dr. Vassie LollAlva so he can address further when he comes back in.

## 2013-06-12 ENCOUNTER — Encounter: Payer: Self-pay | Admitting: Internal Medicine

## 2013-06-12 ENCOUNTER — Telehealth: Payer: Self-pay | Admitting: Pulmonary Disease

## 2013-06-12 ENCOUNTER — Ambulatory Visit (INDEPENDENT_AMBULATORY_CARE_PROVIDER_SITE_OTHER): Payer: 59 | Admitting: Internal Medicine

## 2013-06-12 VITALS — BP 150/88 | HR 69 | Ht 71.0 in | Wt 244.8 lb

## 2013-06-12 DIAGNOSIS — J189 Pneumonia, unspecified organism: Secondary | ICD-10-CM

## 2013-06-12 DIAGNOSIS — Z01811 Encounter for preprocedural respiratory examination: Secondary | ICD-10-CM

## 2013-06-12 DIAGNOSIS — J209 Acute bronchitis, unspecified: Secondary | ICD-10-CM

## 2013-06-12 DIAGNOSIS — R0602 Shortness of breath: Secondary | ICD-10-CM

## 2013-06-12 MED ORDER — PREDNISONE 10 MG PO TABS: ORAL_TABLET | ORAL | Status: DC

## 2013-06-12 MED ORDER — LEVOFLOXACIN 500 MG PO TABS: 500.0000 mg | ORAL_TABLET | Freq: Every day | ORAL | Status: DC

## 2013-06-12 NOTE — Telephone Encounter (Signed)
i gave the same antibiotic for aecopd,so he should take mine for 5 days starting 06/12/2013 and will run through Friday 06/16/13  Please have Dr Dorothey BasemanStrickland call me on my cell anytime after 3pm tomorrow 06/13/13 or anytime 06/14/13 or 4/301/15. Dr Dorothey BasemanStrickland has to decide if to do procedure or not due to steroids, wheezing and antibiotic  Dr. Kalman ShanMurali Jacksen Isip, M.D., [facility] Pulmonary and Critical Care Medicine Staff Physician [facility] Pager: [phone], If no answer or between  15:00h - 7:00h: call [phone]  06/12/2013 7:27 PM

## 2013-06-12 NOTE — Telephone Encounter (Signed)
Spoke with the pt  He states that he was seen by MR this am and was told to call with name of abx and name of his Urologist  Urologist's name is Dr Dorothey BasemanStrickland  The name of the abx is levofloxacin 500 mg and he is to take once the day before surgery, once daily and once the day after prostate bx  Please advise if he is okay to proceed with bx  Thanks!

## 2013-06-12 NOTE — Patient Instructions (Signed)
#  Lung nodules  - CT date April 13., 2015 compared to FEb 2015 5 x 7 mm subpleural right  lower lobe nodule (series 3/image 42), is smaller  5 mm subpleural left lower lobe nodule (series 3/ image 40), is smaller Right lower lobe - scarring , is better  MEdiastinal nodes, is smaller  All findings are improved suggesting thye are due to viruses back in feb 2015  YOu need repeat CT chest wo contrast  in 4 months to document continued resolution  #obstructive lung disease  - appears to be in flare  Up even if spirometry breathing test is normal  - Please take prednisone 40 mg x1 day, then 30 mg x1 day, then 20 mg x1 day, then 10 mg x1 day, and then 5 mg x1 day and stop  -  take levaquin 500mg  once daily  X 5 days  #Preoperative Pulmonary evaluation prior to Urology procedure on 06/15/13  - call us with name of your urologist and I will talk to him to see if you can have urology procedure - call us with name of antibiotic they advised  #Followup  2 months with Dr Vassie LollAlva

## 2013-06-12 NOTE — Progress Notes (Signed)
Subjective:    Patient ID: [name], male    DOB: [birth year], 58 y.o.   MRN: [id number]  HPI   PCP - K shaw   58 year old remote smoker ( quit 95) with bipolar disorder presents for FU of chronic cough and abnormal imaging.  He reports several weeks of cough and shortness of breath, intermittently since October. He would get relief for a few weeks and symptoms would start again. His current bout was ongoing for 2 weeks, PCP started Levaquin which he took for 3 days. A. outpatient CT of the chest showed bilateral pneumonia and mediastinal lymphadenopathy, as a result patient was directly admitted.  He was treated with Rocephin and Zithromax , IV Solu-Medrol, nebulized bronchodilators and other antitussives. Tessalon was not helpful.  A respiratory virus panel was positive for respiratory syncytial virus and metapneumovirus  Chest x-ray from 2/22 suggests a right lower lobe infiltrate on lateral view. CT chest from 2/24 Bilateral lower lobe infiltrates compatible with multifocal pneumonia.  Enlarged mediastinal lymph nodes were noted and a right lower lobe nodule 8mm.  He smoked about 20-30 pack years before quitting in 1995.  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 Follow-up    Reports still having sob with exertion, PND, and non-productive cough.  PNA x1 ago   CXR clear Much improved, almost back to baseline  REC CXR tomorrow- pl call to check We will plan for FU CT chest in April   OV 06/12/2013  Chief Complaint  Patient presents with  . Acute Visit    Pt c/o productive cough with yellow phelgm, increased SOB, wheezing, chest tightness, fatigue x 3 days.    58 year old male  reports that he quit smoking about 20 years ago. His smoking use included Cigarettes. He has a 20 pack-year smoking history. He has never used  smokeless tobacco. ex-smoker. EX -etoh   Admitted in February 2015 with both respiratory syncytial virus and adenovirus coinfection with respirator failure and wheezing. After that he has improved almost back to baseline except for some mild persistent dry cough. For the last few days he feels like it is getting respiratory virus infection all over again. He is extremely concerned that this might result in hospitalization. This is associated with fatigue and some sputum production and wheezing. Symptoms are described as moderate in intensity. No clear cut relieving factors. He does feel subjectively that his viral infection. Most recent CT scan of the chest as documented below and shows improving infiltrates, nodule and the mediastinal  Nodes.  OIf note, has upcoming urology procedure 06/15/13 and wants to know if he should have it and if he should take urology abx NOS  Spirometry in the office today shows: is NORMAL  CLINICAL DATA: History of pneumonia, follow-up enlarged lymph nodes  EXAM:  CT CHEST WITH CONTRAST 4/131/15 TECHNIQUE:  Multidetector CT imaging of the chest was performed during  intravenous contrast administration.  CONTRAST: 75mL OMNIPAQUE IOHEXOL 300 MG/ML SOLN  COMPARISON: CT chest dated 04/11/2013  FINDINGS:  Suspected post infectious/inflammatory scarring in the right lower  lobe (series 3/ image 42). Associated 5 x 7 mm subpleural right  lower lobe nodule (series 3/image 42), previously 8 x 6 mm.  Additional 5 mm subpleural left lower lobe nodule (series 3/ image  40), previously 7 mm.  No new/suspicious pulmonary nodules. No pleural effusion or  pneumothorax.  Visualized thyroid is unremarkable.  The heart is normal in size. No pericardial effusion.  Small mediastinal lymph nodes, including:  --8 mm short axis high right paratracheal node (series 2/ image 15),  previously 12 mm  --7 mm short axis subcarinal node (series 2/ image 29), previously  10 mm  Visualized  upper abdomen is grossly unremarkable.  Mild degenerative changes of the visualized thoracolumbar spine.  Partial fusion anteriorly at T3-4 (sagittal image 88).   IMPRESSION:  Post infectious/inflammatory scarring in the right lower lobe.  Mild subpleural nodularity in the bilateral lower lobes, mildly  improved, likely post infectious/inflammatory.  Mediastinal lymphadenopathy has improved, likely reactive.  Electronically Signed:  By: Charline BillsSriyesh Krishnan M.D.  On: 05/29/2013 11:55   Review of Systems  Constitutional: Negative for fever and unexpected weight change.  HENT: Negative for congestion, dental problem, ear pain, nosebleeds, postnasal drip, rhinorrhea, sinus pressure, sneezing, sore throat and trouble swallowing.   Eyes: Negative for redness and itching.  Respiratory: Positive for cough, chest tightness, shortness of breath and wheezing.   Cardiovascular: Negative for palpitations and leg swelling.  Gastrointestinal: Negative for nausea and vomiting.  Genitourinary: Negative for dysuria.  Musculoskeletal: Negative for joint swelling.  Skin: Negative for rash.  Neurological: Negative for headaches.  Hematological: Does not bruise/bleed easily.  Psychiatric/Behavioral: Negative for dysphoric mood. The patient is not nervous/anxious.        Objective:   Physical Exam  Nursing note and vitals reviewed. Constitutional: He is oriented to person, place, and time. He appears well-developed and well-nourished. No distress.  Body mass index is 34.16 kg/(m^2).   HENT:  Head: Normocephalic and atraumatic.  Right Ear: External ear normal.  Left Ear: External ear normal.  Mouth/Throat: Oropharynx is clear and moist. No oropharyngeal exudate.  Eyes: Conjunctivae and EOM are normal. Pupils are equal, round, and reactive to light. Right eye exhibits no discharge. Left eye exhibits no discharge. No scleral icterus.  Neck: Normal range of motion. Neck supple. No JVD present. No tracheal  deviation present. No thyromegaly present.  Cardiovascular: Normal rate, regular rhythm and intact distal pulses.  Exam reveals no gallop and no friction rub.   No murmur heard. Pulmonary/Chest: Effort normal. No respiratory distress. He has wheezes. He has no rales. He exhibits no tenderness.  Does have wheezing  Abdominal: Soft. Bowel sounds are normal. He exhibits no distension and no mass. There is no tenderness. There is no rebound and no guarding.  Musculoskeletal: Normal range of motion. He exhibits no edema and no tenderness.  Lymphadenopathy:    He has no cervical adenopathy.  Neurological: He is alert and oriented to person, place, and time. He has normal reflexes. No cranial nerve deficit. Coordination normal.  Skin: Skin is warm and dry. No rash noted. He is not diaphoretic. No erythema. No pallor.  Psychiatric: He has a normal mood and affect. His behavior is normal. Judgment and thought content normal.  Flat affect          Assessment & Plan:

## 2013-06-13 NOTE — Telephone Encounter (Signed)
I spoke with patient and he is aware to take abx given by MR from 06-12-13 through 06-16-13 and states that Dr Hosie SpangleMatthew Elkridge at [facility] is the MD. His phone number is [phone]. I have called and requested that Dr Bonnee QuinElkridge give MR a call on his cell number(gave to the office)([facility] after 3pm today or anytime 06/14/13 or 06/15/13.  Pt would like to have MR call him after he has spoken with Dr Bonnee QuinElkridge and let him know the outcome. Pt is eager to get Prostate bx done because if he cant have it done now then he will have to go another 6 weeks before the bx can be done-pt does not want to wait that long to find out if he has prostate cancer or not.   Message sent to MR as Red Flag as a reminder to call patient.

## 2013-06-14 DIAGNOSIS — J209 Acute bronchitis, unspecified: Secondary | ICD-10-CM | POA: Insufficient documentation

## 2013-06-14 DIAGNOSIS — Z01811 Encounter for preprocedural respiratory examination: Secondary | ICD-10-CM | POA: Insufficient documentation

## 2013-06-14 NOTE — Assessment & Plan Note (Signed)
#  obstructive lung disease  - appears to be in flare  Up even if spirometry breathing test is normal  - Please take prednisone 40 mg x1 day, then 30 mg x1 day, then 20 mg x1 day, then 10 mg x1 day, and then 5 mg x1 day and stop  -  take levaquin 500mg  once daily  X 5 days #Followup  2 months with Dr Vassie LollAlva

## 2013-06-14 NOTE — Assessment & Plan Note (Signed)
 #  Preoperative Pulmonary evaluation prior to Urology procedure on 06/15/13  - call us with name of your urologist and I will talk to him to see if you can have urology procedure - call us with name of antibiotic they advised

## 2013-06-14 NOTE — Telephone Encounter (Signed)
SPoke to Dr Mena GoesEskridge:   - he will cancel procedure due to recent infection. He will call patient and updated  Spoke to patient   - updated of above. Patient was not too happy about above but accepted it   Dr. Kalman ShanMurali Falesha Schommer, M.D., [facility].P Pulmonary and Critical Care Medicine Staff Physician [facility] Pager: [phone], If no answer or between  15:00h - 7:00h: call [phone]  06/14/2013 4:56 PM

## 2013-06-14 NOTE — Assessment & Plan Note (Signed)
#  Lung nodules  - CT date April 13., 2015 compared to FEb 2015 5 x 7 mm subpleural right  lower lobe nodule (series 3/image 42), is smaller  5 mm subpleural left lower lobe nodule (series 3/ image 40), is smaller Right lower lobe - scarring , is better  MEdiastinal nodes, is smaller  All findings are improved suggesting thye are due to viruses back in feb 2015  YOu need repeat CT chest wo contrast  in 4 months to document continued resolution #Followup  2 months with Dr Vassie LollAlva

## 2013-06-15 ENCOUNTER — Telehealth: Payer: Self-pay | Admitting: Pulmonary Disease

## 2013-06-15 MED ORDER — PROMETHAZINE-CODEINE 6.25-10 MG/5ML PO SYRP
5.0000 mL | ORAL_SOLUTION | Freq: Four times a day (QID) | ORAL | Status: DC | PRN
Start: 2013-06-15 — End: 2013-06-21

## 2013-06-15 NOTE — Telephone Encounter (Signed)
Per OV with MR 06/12/13:  #obstructive lung disease  - appears to be in flare Up even if spirometry breathing test is normal  - Please take prednisone 40 mg x1 day, then 30 mg x1 day, then 20 mg x1 day, then 10 mg x1 day, and then 5 mg x1 day and stop  - take levaquin 55m once daily X 5 days  #Preoperative Pulmonary evaluation prior to Urology procedure on 06/15/13  - [place] your urologist and I will talk to him to see if you can have urology procedure  - call [place] name of antibiotic they advised  ---  Spoke with pt. He reports he started the ABX and prednisone. He was up all night coughing per pt. C/o prod cough green phlem, slight PND and nasal congestion. He reports he has tried delsym and robitussin DM. Requesting cough syrup. Pt saw MR for acute visit since RA was not in office. Will forward to DOD for recs. Please advise Dr. YAnnamaria Bootsthanks  No Known Allergies   Current Outpatient Prescriptions on File Prior to Visit  Medication Sig Dispense Refill  . albuterol (PROVENTIL HFA;VENTOLIN HFA) 108 (90 BASE) MCG/ACT inhaler Inhale 2 puffs into the lungs every 4 (four) hours as needed for wheezing or shortness of breath (COUGH).  1 Inhaler  0  . benzonatate (TESSALON) 200 MG capsule Take 1 capsule (200 mg total) by mouth 3 (three) times daily.  30 capsule  0  . budesonide-formoterol (SYMBICORT) 160-4.5 MCG/ACT inhaler Inhale 2 puffs into the lungs 2 times daily at 12 noon and 4 pm.  1 Inhaler  0  . chlorpheniramine (CHLOR-TRIMETON) 4 MG tablet Take 1 tablet (4 mg total) by mouth 3 (three) times daily.  14 tablet  0  . fluticasone (FLONASE) 50 MCG/ACT nasal spray Place 2 sprays into both nostrils daily.  16 g  2  . guaiFENesin (MUCINEX) 600 MG 12 hr tablet Take 2 tablets (1,200 mg total) by mouth 2 (two) times daily.  30 tablet  0  . lamoTRIgine (LAMICTAL) 150 MG tablet Take 150 mg by mouth at bedtime. Mood /depression      . levofloxacin (LEVAQUIN) 500 MG tablet Take 1 tablet  (500 mg total) by mouth daily.  5 tablet  0  . omega-3 acid ethyl esters (LOVAZA) 1 G capsule Take 4 g by mouth daily.      . pantoprazole (PROTONIX) 40 MG tablet Take 1 tablet (40 mg total) by mouth every morning.  30 tablet  0  . predniSONE (DELTASONE) 10 MG tablet Take 456mx1 day, 3068m1 day, 51m107m day, 10mg69may then 5mg x41may  11 tablet  0  . senna (SENOKOT) 8.6 MG TABS tablet Take 2 tablets (17.2 mg total) by mouth daily as needed for mild constipation.  120 each  0  . traMADol (ULTRAM) 50 MG tablet Take 1 tablet (50 mg total) by mouth 3 (three) times daily as needed for moderate pain (cough).  30 tablet  0  . traZODone (DESYREL) 50 MG tablet Take 50 mg by mouth at bedtime as needed for sleep.       . Vilazodone HCl (VIIBRYD) 10 & 20 & 40 MG KIT Take 30 mg by mouth daily.       No current facility-administered medications on file prior to visit.

## 2013-06-15 NOTE — Telephone Encounter (Signed)
Offer prometh codeine  180 ml. 5 ml e very 6 hours if needed for cough, no refill

## 2013-06-15 NOTE — Telephone Encounter (Signed)
Called and spoke with pt and he is aware of CY recs and was ok with the promethazine with codeine being  Called into the pharmacy.  This has been called to [facility].  Pt is aware.

## 2013-06-20 ENCOUNTER — Telehealth: Payer: Self-pay | Admitting: Pulmonary Disease

## 2013-06-20 NOTE — Telephone Encounter (Signed)
Spoke with pt. Appt schedled to see RA 06/21/13

## 2013-06-21 ENCOUNTER — Ambulatory Visit (INDEPENDENT_AMBULATORY_CARE_PROVIDER_SITE_OTHER): Payer: 59 | Admitting: Pulmonary Disease

## 2013-06-21 ENCOUNTER — Encounter: Payer: Self-pay | Admitting: Pulmonary Disease

## 2013-06-21 VITALS — BP 114/60 | HR 109 | Ht 71.0 in | Wt 243.0 lb

## 2013-06-21 DIAGNOSIS — R053 Chronic cough: Secondary | ICD-10-CM | POA: Insufficient documentation

## 2013-06-21 DIAGNOSIS — R590 Localized enlarged lymph nodes: Secondary | ICD-10-CM

## 2013-06-21 DIAGNOSIS — R599 Enlarged lymph nodes, unspecified: Secondary | ICD-10-CM

## 2013-06-21 DIAGNOSIS — R05 Cough: Secondary | ICD-10-CM

## 2013-06-21 DIAGNOSIS — R059 Cough, unspecified: Secondary | ICD-10-CM

## 2013-06-21 MED ORDER — CHLORPHENIRAMINE MALEATE 4 MG PO TABS: 4.0000 mg | ORAL_TABLET | Freq: Two times a day (BID) | ORAL | Status: DC | PRN

## 2013-06-21 MED ORDER — PSEUDOEPHEDRINE HCL 60 MG PO TABS: 60.0000 mg | ORAL_TABLET | Freq: Every day | ORAL | Status: DC

## 2013-06-21 MED ORDER — PANTOPRAZOLE SODIUM 40 MG PO TBEC: 40.0000 mg | DELAYED_RELEASE_TABLET | Freq: Every day | ORAL | Status: DC

## 2013-06-21 NOTE — Patient Instructions (Signed)
For your chronic cough, we will target sinus drip & reflux Take chlorpheniramine 4mg at bedtime  with sudafed 60 mg x 4 weeks Take DELSYm 5ml thrice daily for cough suppression x 4 weeks Take protonix daily for reflux 

## 2013-06-21 NOTE — Progress Notes (Signed)
   Subjective:    Patient ID: [name], male    DOB: 1955-07-16, 58 y.o.   MRN: [id number]  HPI  PCP - K shaw   58 year old remote smoker ( quit 95) with bipolar disorder  for FU of chronic cough and abnormal imaging.  He reports several weeks of cough and shortness of breath, intermittently since October 2014. He would get relief for a few weeks and symptoms would start again. His current bout was ongoing for 2 weeks, outpatient CT of the chest 04/11/13  showed bilateral  lower lobe infiltrates compatible with multifocal pneumonia. and mediastinal lymphadenopathy, as a result patient was directly admitted. A right lower lobe nodule 8mm. Was also noted  He was treated with Rocephin and Zithromax , IV Solu-Medrol, nebulized bronchodilators and other antitussives. Tessalon was not helpful.  A respiratory virus panel was positive for respiratory syncytial virus and metapneumovirus   He smoked about 20-30 pack years before quitting in 1995.  Is coughing is improved about 50%  He works as a Oceanographerpublisher.  He denies postnasal drip but admits to occasional reflux depending on kind of food intake   FU CT scan of the chest 05/30/13   showed improving infiltrates, nodule and the mediastinal Nodes.    06/12/13 Spirometry - no airway obstruction, FEV1 90% >> levaquin + pred  06/21/2013  Chief Complaint  Patient presents with  . Follow-up    C/o prod cough variety colored phlem, wheezing, chest tx, SOB (not able to exercise), PND, slight nasal congestion. Pt has finished ABX.    Cough has returned with a vengeance, mostly non productive, no wheezing Completed levaquin & prednisone given last visit - mild improvement No overt reflux, occ post nasal drip C/o fatigue from excessive coughing Cannot participate in crossfit Does not want codeine Prostate biopsy has been deferred  Review of Systems neg for any significant sore throat, dysphagia, itching, sneezing, nasal congestion or excess/ purulent  secretions, fever, chills, sweats, unintended wt loss, pleuritic or exertional cp, hempoptysis, orthopnea pnd or change in chronic leg swelling. Also denies presyncope, palpitations, heartburn, abdominal pain, nausea, vomiting, diarrhea or change in bowel or urinary habits, dysuria,hematuria, rash, arthralgias, visual complaints, headache, numbness weakness or ataxia.     Objective:   Physical Exam  Gen. Pleasant, well-nourished, in no distress, normal affect ENT - no lesions, no post nasal drip Neck: No JVD, no thyromegaly, no carotid bruits Lungs: no use of accessory muscles, no dullness to percussion, clear without rales or rhonchi  Cardiovascular: Rhythm regular, heart sounds  normal, no murmurs or gallops, no peripheral edema Abdomen: soft and non-tender, no hepatosplenomegaly, BS normal. Musculoskeletal: No deformities, no cyanosis or clubbing Neuro:  alert, non focal       Assessment & Plan:

## 2013-06-21 NOTE — Assessment & Plan Note (Signed)
For your chronic cough, we will target sinus drip & reflux Take chlorpheniramine 4mg  at bedtime  with sudafed 60 mg x 4 weeks Take DELSYm 5ml thrice daily for cough suppression x 4 weeks Take protonix daily for reflux

## 2013-06-22 NOTE — Assessment & Plan Note (Signed)
resolved 

## 2013-06-25 ENCOUNTER — Telehealth: Payer: Self-pay | Admitting: Pulmonary Disease

## 2013-06-25 NOTE — Telephone Encounter (Signed)
OK to give promethazine-codeine 5ml PO qhs - No driving if drowsy Take DELSYm daytime

## 2013-06-25 NOTE — Telephone Encounter (Signed)
Pt c/o worsening cough.  Based on last note with RA, felt to have upper airway cough.  The pt is not doing hard candy, and is still using voice.  He is staying on otc cough syrup, antihistamine, and reflux med.  I have told him I cannot call in narcotic cough syrup,and he will need to pick up a prescription in am.  He will call triage for this in am.  In meantime, complete voice rest and hard candy to minimize tickle.

## 2013-06-26 ENCOUNTER — Telehealth: Payer: Self-pay | Admitting: Pulmonary Disease

## 2013-06-26 MED ORDER — PROMETHAZINE-CODEINE 6.25-10 MG/5ML PO SYRP: 5.0000 mL | ORAL_SOLUTION | Freq: Every evening | ORAL | Status: DC | PRN

## 2013-06-26 NOTE — Telephone Encounter (Signed)
Zachary Milchakesh V Alva, MD at 06/25/2013 5:39 PM     Status: Signed        OK to give promethazine-codeine 5ml PO qhs - No driving if drowsy  Take DELSYm daytime  ---   Called spoke with pt. [place]s. [place] [place] [facility]

## 2013-06-26 NOTE — Telephone Encounter (Signed)
Called spoke with Selena BattenKim from [facility]. Gave VO. Nothing further needed

## 2013-07-26 ENCOUNTER — Ambulatory Visit (INDEPENDENT_AMBULATORY_CARE_PROVIDER_SITE_OTHER): Payer: 59 | Admitting: Pulmonary Disease

## 2013-07-26 ENCOUNTER — Encounter: Payer: Self-pay | Admitting: Pulmonary Disease

## 2013-07-26 VITALS — BP 126/80 | HR 88 | Ht 70.0 in | Wt 245.0 lb

## 2013-07-26 DIAGNOSIS — R053 Chronic cough: Secondary | ICD-10-CM

## 2013-07-26 DIAGNOSIS — R059 Cough, unspecified: Secondary | ICD-10-CM

## 2013-07-26 DIAGNOSIS — R05 Cough: Secondary | ICD-10-CM

## 2013-07-26 MED ORDER — BECLOMETHASONE DIPROPIONATE 80 MCG/ACT IN AERS: 2.0000 | INHALATION_SPRAY | Freq: Two times a day (BID) | RESPIRATORY_TRACT | Status: DC

## 2013-07-26 MED ORDER — PREDNISONE 10 MG PO TABS: ORAL_TABLET | ORAL | Status: DC

## 2013-07-26 MED ORDER — AMOXICILLIN-POT CLAVULANATE 875-125 MG PO TABS: 1.0000 | ORAL_TABLET | Freq: Two times a day (BID) | ORAL | Status: DC

## 2013-07-26 MED ORDER — PROMETHAZINE-CODEINE 6.25-10 MG/5ML PO SYRP: 5.0000 mL | ORAL_SOLUTION | Freq: Every evening | ORAL | Status: DC | PRN

## 2013-07-26 NOTE — Patient Instructions (Signed)
CT scan of sinuses Augmentin twice daily x 7 days Take probiotic with this Prednisone 10 mg -Take 4 tabs  daily with food x 4 days, then 3 tabs daily x 4 days, then 2 tabs daily x 4 days, then 1 tab daily x4 days then stop. #40 Qvar inhaler 1 puff twice daily - RINSE mouth after use Refill on codeine cough syrup

## 2013-07-26 NOTE — Assessment & Plan Note (Addendum)
Unclear etiology-he has not responded to empiric treatment for postnasal drip and reflux CT scan of sinuses Augmentin twice daily x 7 days Take probiotic with this Prednisone 10 mg -Take 4 tabs  daily with food x 4 days, then 3 tabs daily x 4 days, then 2 tabs daily x 4 days, then 1 tab daily x4 days then stop. #40 Qvar inhaler 1 puff twice daily - RINSE mouth after use Refill on codeine cough syrup  If persistent, consider Ba swallow & bronchoscopy as last resort

## 2013-07-26 NOTE — Progress Notes (Signed)
   Subjective:    Patient ID: [name], male    DOB: [birth year], 58 y.o.   MRN: [id number]  HPI   PCP - K shaw   58 year old remote smoker ( quit 95) with bipolar disorder for FU of chronic cough and abnormal imaging.  He reports several weeks of cough and shortness of breath, intermittently since October 2014. He would get relief for a few weeks and symptoms would start again. CT of the chest 04/11/13 showed bilateral lower lobe infiltrates compatible with multifocal pneumonia. and mediastinal lymphadenopathy, as a result patient was directly admitted. A right lower lobe nodule 37mm. Was also noted  He was treated with Rocephin and Zithromax , IV Solu-Medrol, nebulized bronchodilators and other antitussives. Tessalon was not helpful.  A respiratory virus panel was positive for respiratory syncytial virus and metapneumovirus  He smoked about 20-30 pack years before quitting in 1995.  He works as a Oceanographer.   FU CT scan of the chest 05/30/13 showed improving infiltrates, nodule and the mediastinal Nodes.  06/12/13 Spirometry - no airway obstruction, FEV1 90%  >> levaquin + pred    07/26/2013  Chief Complaint  Patient presents with  . Cough    Still having cough, throat feeling clogged, wheezing and some trouble swallowing due to throat   On his last visit we decided to target sinus drip & reflux as a cause for his  chronic cough,  Did not Take chlorpheniramine 4mg  at bedtime with sudafed 60 mg due to psych issues - DELSYm daily did not help much Took protonix daily for reflux promethazine-codeine 70ml PO qhs  Provided relief & could sleep, he is concerned about getting addicted to codeine based on his prior history of alcohol dependence  No overt reflux, occ post nasal drip  C/o fatigue from excessive coughing  Cannot participate in crossfit  He has received 2 rounds of antibiotics and steroids in the last 2 months  Review of Systems neg for any significant sore throat,  dysphagia, itching, sneezing, nasal congestion or excess/ purulent secretions, fever, chills, sweats, unintended wt loss, pleuritic or exertional cp, hempoptysis, orthopnea pnd or change in chronic leg swelling. Also denies presyncope, palpitations, heartburn, abdominal pain, nausea, vomiting, diarrhea or change in bowel or urinary habits, dysuria,hematuria, rash, arthralgias, visual complaints, headache, numbness weakness or ataxia.     Objective:   Physical Exam  Gen. Pleasant, well-nourished, in no distress, normal affect ENT - no lesions, no post nasal drip Neck: No JVD, no thyromegaly, no carotid bruits Lungs: no use of accessory muscles, no dullness to percussion, clear without rales , faint rhonchi after coughing Cardiovascular: Rhythm regular, heart sounds  normal, no murmurs or gallops, no peripheral edema Abdomen: soft and non-tender, no hepatosplenomegaly, BS normal. Musculoskeletal: No deformities, no cyanosis or clubbing Neuro:  alert, non focal        Assessment & Plan:

## 2013-07-28 ENCOUNTER — Other Ambulatory Visit (INDEPENDENT_AMBULATORY_CARE_PROVIDER_SITE_OTHER): Payer: 59

## 2013-07-28 DIAGNOSIS — R059 Cough, unspecified: Secondary | ICD-10-CM

## 2013-07-28 DIAGNOSIS — R053 Chronic cough: Secondary | ICD-10-CM

## 2013-07-28 DIAGNOSIS — R05 Cough: Secondary | ICD-10-CM

## 2013-07-28 LAB — CBC WITH DIFFERENTIAL/PLATELET
BASOS ABS: 0.1 10*3/uL (ref 0.0–0.1)
Basophils Relative: 0.5 % (ref 0.0–3.0)
Eosinophils Absolute: 0.2 10*3/uL (ref 0.0–0.7)
Eosinophils Relative: 1.5 % (ref 0.0–5.0)
HEMATOCRIT: 40.5 % (ref 39.0–52.0)
Hemoglobin: 13.9 g/dL (ref 13.0–17.0)
LYMPHS ABS: 3.5 10*3/uL (ref 0.7–4.0)
Lymphocytes Relative: 31.8 % (ref 12.0–46.0)
MCHC: 34.2 g/dL (ref 30.0–36.0)
MCV: 90.7 fl (ref 78.0–100.0)
Monocytes Absolute: 0.6 10*3/uL (ref 0.1–1.0)
Monocytes Relative: 5.2 % (ref 3.0–12.0)
Neutro Abs: 6.6 10*3/uL (ref 1.4–7.7)
Neutrophils Relative %: 61 % (ref 43.0–77.0)
Platelets: 268 10*3/uL (ref 150.0–400.0)
RBC: 4.47 Mil/uL (ref 4.22–5.81)
RDW: 12.9 % (ref 11.5–15.5)
WBC: 10.9 10*3/uL — ABNORMAL HIGH (ref 4.0–10.5)

## 2013-07-31 ENCOUNTER — Inpatient Hospital Stay: Admission: RE | Admit: 2013-07-31 | Payer: 59 | Source: Ambulatory Visit

## 2013-07-31 LAB — ALLERGY FULL PROFILE
Allergen, D pternoyssinus,d7: <0.1 kU/L
Allergen,Goose feathers, e70: <0.1 kU/L
Alternaria Alternata: 0.13 kU/L — ABNORMAL HIGH
Aspergillus fumigatus, m3: 0.13 kU/L — ABNORMAL HIGH
CANDIDA ALBICANS: 0.11 kU/L — AB
Cat Dander: <0.1 kU/L
Common Ragweed: <0.1 kU/L
D. farinae: 0.13 kU/L — ABNORMAL HIGH
Elm IgE: <0.1 kU/L
G009 Red Top: <0.1 kU/L
HELMINTHOSPORIUM HALODES: 0.11 kU/L — AB
House Dust Hollister: <0.1 kU/L
IGE (IMMUNOGLOBULIN E), SERUM: 149.2 [IU]/mL (ref 0.0–180.0)
Lamb's Quarters: 0.12 kU/L — ABNORMAL HIGH
Oak: <0.1 kU/L
Plantain: <0.1 kU/L
Stemphylium Botryosum: 0.26 kU/L — ABNORMAL HIGH
Timothy Grass: <0.1 kU/L

## 2013-08-01 ENCOUNTER — Ambulatory Visit (INDEPENDENT_AMBULATORY_CARE_PROVIDER_SITE_OTHER)
Admission: RE | Admit: 2013-08-01 | Discharge: 2013-08-01 | Disposition: A | Discharge status: Home / Self Care | Payer: 59 | Source: Ambulatory Visit | Attending: Pulmonary Disease | Admitting: Pulmonary Disease

## 2013-08-01 ENCOUNTER — Telehealth: Payer: Self-pay | Admitting: Pulmonary Disease

## 2013-08-01 DIAGNOSIS — R05 Cough: Secondary | ICD-10-CM

## 2013-08-01 DIAGNOSIS — R059 Cough, unspecified: Secondary | ICD-10-CM

## 2013-08-01 NOTE — Telephone Encounter (Signed)
Result Note     RAST -Low grade positive to mold     No change in plan   --  I spoke with patient about results and he verbalized understanding and had no questions

## 2013-08-03 ENCOUNTER — Other Ambulatory Visit: Payer: Self-pay | Admitting: Pulmonary Disease

## 2013-08-03 DIAGNOSIS — J320 Chronic maxillary sinusitis: Secondary | ICD-10-CM

## 2013-08-10 ENCOUNTER — Ambulatory Visit: Payer: 59 | Admitting: Internal Medicine

## 2013-08-28 ENCOUNTER — Encounter: Payer: Self-pay | Admitting: Adult Health

## 2013-08-28 ENCOUNTER — Ambulatory Visit (INDEPENDENT_AMBULATORY_CARE_PROVIDER_SITE_OTHER): Payer: 59 | Admitting: Adult Health

## 2013-08-28 ENCOUNTER — Telehealth: Payer: Self-pay | Admitting: Pulmonary Disease

## 2013-08-28 VITALS — BP 126/86 | HR 99 | Temp 97.9°F | Ht 71.0 in | Wt 246.8 lb

## 2013-08-28 DIAGNOSIS — R059 Cough, unspecified: Secondary | ICD-10-CM

## 2013-08-28 DIAGNOSIS — R053 Chronic cough: Secondary | ICD-10-CM

## 2013-08-28 DIAGNOSIS — R05 Cough: Secondary | ICD-10-CM

## 2013-08-28 MED ORDER — PROMETHAZINE-CODEINE 6.25-10 MG/5ML PO SYRP: 5.0000 mL | ORAL_SOLUTION | Freq: Every evening | ORAL | Status: DC | PRN

## 2013-08-28 NOTE — Progress Notes (Signed)
   Subjective:    Patient ID: [name], male    DOB: 23-Feb-1955, 58 y.o.   MRN: [id number]  HPI   PCP - K shaw   58 year old remote smoker ( quit 95) with bipolar disorder for FU of chronic cough and abnormal imaging.  He reports several weeks of cough and shortness of breath, intermittently since October 2014. He would get relief for a few weeks and symptoms would start again. CT of the chest 04/11/13 showed bilateral lower lobe infiltrates compatible with multifocal pneumonia. and mediastinal lymphadenopathy, as a result patient was directly admitted. A right lower lobe nodule 8mm. Was also noted  He was treated with Rocephin and Zithromax , IV Solu-Medrol, nebulized bronchodilators and other antitussives. Tessalon was not helpful.  A respiratory virus panel was positive for respiratory syncytial virus and metapneumovirus  He smoked about 20-30 pack years before quitting in 1995.  He works as a Oceanographerpublisher.   FU CT scan of the chest 05/30/13 showed improving infiltrates, nodule and the mediastinal Nodes.  06/12/13 Spirometry - no airway obstruction, FEV1 90%  >> levaquin + pred   On his last visit we decided to target sinus drip & reflux as a cause for his  chronic cough,  Did not Take chlorpheniramine 4mg  at bedtime with sudafed 60 mg due to psych issues - DELSYm daily did not help much Took protonix daily for reflux promethazine-codeine 5ml PO qhs  Provided relief & could sleep, he is concerned about getting addicted to codeine based on his prior history of alcohol dependence  No overt reflux, occ post nasal drip  C/o fatigue from excessive coughing  Cannot participate in crossfit  He has received 2 rounds of antibiotics and steroids in the last 2 months >augmentin /pred , rx QVAR  CT sinus chronic sinusitis    08/28/2013 Acute OV  Complains of chest congestion, wheezing, tightness, dry cough x 4 days.   Denies purulent mucus, hemoptysis, f/c/s/n/v/d, PND, leg swelling.  Had  flare last ov given augmentin and prednisone taper.  Has seen Dr. Pollyann Kennedyosen , ENT , CT sinus showed chronic sinusitis no acute issues  Started on Flonase w/ no help.  Requests cough syrup to help sleep at night due to cough .         Review of Systems neg for any significant sore throat, dysphagia, itching, sneezing,  fever, chills, sweats, unintended wt loss, pleuritic or exertional cp, hempoptysis, orthopnea pnd or change in chronic leg swelling. Also denies presyncope, palpitations, heartburn, abdominal pain, nausea, vomiting, diarrhea or change in bowel or urinary habits, dysuria,hematuria, rash, arthralgias, visual complaints, headache, numbness weakness or ataxia.     Objective:   Physical Exam  Gen. Pleasant, in no distress, normal affect ENT - no lesions, no post nasal drip Neck: No JVD, no thyromegaly, no carotid bruits Lungs: no use of accessory muscles, no dullness to percussion, clear without rales , faint rhonchi after coughing Cardiovascular: Rhythm regular, heart sounds  normal, no murmurs or gallops, no peripheral edema Abdomen: soft and non-tender, no hepatosplenomegaly, BS normal. Musculoskeletal: No deformities, no cyanosis or clubbing Neuro:  alert, non focal        Assessment & Plan:

## 2013-08-28 NOTE — Telephone Encounter (Signed)
Ov with all meds in hand, ok to add on, nothing else to offer over the phone

## 2013-08-28 NOTE — Assessment & Plan Note (Signed)
Slow to resolve flare with cough   Plan  Qvar inhaler 1 puff twice daily - RINSE mouth after use Please use Delsym 2 tsp Twice daily  For cough  Refill on codeine cough syrup-may use 1 tsp every At bedtime As needed  cough,may make you sleepy.  Continue Flonase 1 puff Twice daily   Increase Chlorphenaramine 4mg  2 tabs At bedtime   Avoid mint products  follow up Dr. Vassie LollAlva  In 6 weeks and As needed   Please contact office for sooner follow up if symptoms do not improve or worsen or seek emergency care

## 2013-08-28 NOTE — Telephone Encounter (Addendum)
Pt scheduled for OV with TP today at 2:45 Aware to bring all meds in hand to visit. Nothing further needed.

## 2013-08-28 NOTE — Telephone Encounter (Signed)
Pt c/o increased cough--mild production, deep chest cough.  Pt reports difficulty sleeping d/t cough and gagging/dry heaving d/t inc cough Unable to carry on conversation for long period of time on phone d/t cough Pt states that he has seen Dr Pollyann Kennedyosen GENT-- no explanation for cough-- small place noted in sinus per patient Pt has changed diet-- no improvement Requesting something be called into pharmacy  Upcoming 09/11/13 with RA No Known Allergies  Please advise Dr Sherene SiresWert as Dr Vassie LollAlva is not in office. Thanks.

## 2013-08-28 NOTE — Patient Instructions (Signed)
Qvar inhaler 1 puff twice daily - RINSE mouth after use Please use Delsym 2 tsp Twice daily  For cough  Refill on codeine cough syrup-may use 1 tsp every At bedtime As needed  cough,may make you sleepy.  Continue Flonase 1 puff Twice daily   Increase Chlorphenaramine 4mg  2 tabs At bedtime   Avoid mint products  follow up Dr. Vassie LollAlva  In 6 weeks and As needed   Please contact office for sooner follow up if symptoms do not improve or worsen or seek emergency care

## 2013-08-29 MED ORDER — BECLOMETHASONE DIPROPIONATE 80 MCG/ACT IN AERS: 1.0000 | INHALATION_SPRAY | Freq: Two times a day (BID) | RESPIRATORY_TRACT | Status: DC

## 2013-08-29 NOTE — Addendum Note (Signed)
Addended by: Boone MasterJONES, JESSICA E on: 08/29/2013 08:52 AM   Modules accepted: Orders, Medications

## 2013-08-30 NOTE — Progress Notes (Signed)
Reviewed & agree with plan  

## 2013-09-11 ENCOUNTER — Telehealth: Payer: Self-pay | Admitting: Pulmonary Disease

## 2013-09-11 ENCOUNTER — Ambulatory Visit: Payer: 59 | Admitting: Pulmonary Disease

## 2013-09-11 MED ORDER — PANTOPRAZOLE SODIUM 40 MG PO TBEC: 40.0000 mg | DELAYED_RELEASE_TABLET | Freq: Every day | ORAL | Status: DC

## 2013-09-11 MED ORDER — PROMETHAZINE-CODEINE 6.25-10 MG/5ML PO SYRP: 5.0000 mL | ORAL_SOLUTION | ORAL | Status: DC | PRN

## 2013-09-11 NOTE — Telephone Encounter (Signed)
This refers to the combination of meds that we tried for him - anti-histaminic, sudafed, gerd therapy

## 2013-09-11 NOTE — Telephone Encounter (Signed)
Called and spoke with pt and he is aware of RA recs.  Nothing further is needed.  

## 2013-09-11 NOTE — Telephone Encounter (Signed)
OK to refill protonix & cough syrup

## 2013-09-11 NOTE — Telephone Encounter (Signed)
OK  X 1- pl let him know that this should last for 24 days

## 2013-09-11 NOTE — Telephone Encounter (Signed)
[facility] [id number] - Phenergan-Codeine cough syrup 120mL  Pt [facility] -------- Pt states that he has a friend that had a horrible cough for a long period of time and his doctors told him it had become a behavioral issue which in turn caused the coughing to be triggered-- pt asking what this regimen was that his friend tried; states that his cough went away after doing a course of medications for a short period time. Cyclical Cough Protocol??  Pt wanting to know if this same thing would be available to him. States that he is becoming very depressed d/t feeling so drained all the time because of the physical stress this cough has on his body everyday. Pt states that he coughs so hard at times that his chest hurts and head throbs. Pt states that it takes a lot out of him on a daily basis  Please advise Dr Vassie LollAlva. Thanks.

## 2013-09-11 NOTE — Telephone Encounter (Signed)
Pt calling requesting refill of Protonix 40mg --[facility]  Pt also requesting a refill of promethazine-codeine (PHENERGAN WITH CODEINE) 6.25-10 MG/5ML syrup- 5mL prn cough at bedtime Last filled 07/26/13 with 120mL  Upcoming OV with RA 10/11/13 Pt states that he uses it at night/bedtime if cough is increased and has been having to use it when he gets home from work as his cough is more increased. Pt states that Delsym no longer gives relief.  Please advise Dr Vassie LollAlva if okay to refill the patients cough syrup

## 2013-09-11 NOTE — Telephone Encounter (Signed)
Refilled protonix. Called and spoke to pt to make him aware we will have to get message back to Dr. Vassie LollAlva. Pt aware protonix refilled.   Pt's last Phenergan with Codeine refill was on 08/28/13 (not 07/26/13)with 120ml. Pt states he is having to use cough syrup at bedtime and when getting home from work.  Still ok to refill? RA please advise.   No Known Allergies

## 2013-09-12 ENCOUNTER — Telehealth: Payer: Self-pay | Admitting: Pulmonary Disease

## 2013-09-12 NOTE — Telephone Encounter (Signed)
Called made of recs. He voiced his understanding and needed nothing further needed.

## 2013-09-12 NOTE — Telephone Encounter (Signed)
Re: cyclic cough regimen Can try Protonix , delsym /cough syrup  daytime, Zyrtec and chlorphenaramine 4mg  2 At bedtime And every 4hrs As needed If can tolerate.

## 2013-09-26 ENCOUNTER — Other Ambulatory Visit: Payer: Self-pay | Admitting: Pulmonary Disease

## 2013-09-26 ENCOUNTER — Telehealth: Payer: Self-pay | Admitting: Pulmonary Disease

## 2013-09-26 ENCOUNTER — Ambulatory Visit (INDEPENDENT_AMBULATORY_CARE_PROVIDER_SITE_OTHER): Payer: 59 | Admitting: Pulmonary Disease

## 2013-09-26 ENCOUNTER — Encounter: Payer: Self-pay | Admitting: Pulmonary Disease

## 2013-09-26 ENCOUNTER — Ambulatory Visit (INDEPENDENT_AMBULATORY_CARE_PROVIDER_SITE_OTHER)
Admission: RE | Admit: 2013-09-26 | Discharge: 2013-09-26 | Disposition: A | Discharge status: Home / Self Care | Payer: 59 | Source: Ambulatory Visit | Attending: Pulmonary Disease | Admitting: Pulmonary Disease

## 2013-09-26 VITALS — BP 122/74 | HR 84 | Temp 97.0°F | Ht 71.0 in | Wt 244.4 lb

## 2013-09-26 DIAGNOSIS — R059 Cough, unspecified: Secondary | ICD-10-CM

## 2013-09-26 DIAGNOSIS — J320 Chronic maxillary sinusitis: Secondary | ICD-10-CM

## 2013-09-26 DIAGNOSIS — K219 Gastro-esophageal reflux disease without esophagitis: Secondary | ICD-10-CM

## 2013-09-26 DIAGNOSIS — R05 Cough: Secondary | ICD-10-CM

## 2013-09-26 DIAGNOSIS — J690 Pneumonitis due to inhalation of food and vomit: Secondary | ICD-10-CM

## 2013-09-26 DIAGNOSIS — K2100 Gastro-esophageal reflux disease with esophagitis, without bleeding: Secondary | ICD-10-CM

## 2013-09-26 DIAGNOSIS — R053 Chronic cough: Secondary | ICD-10-CM

## 2013-09-26 MED ORDER — METOCLOPRAMIDE HCL 10 MG PO TABS: ORAL_TABLET | ORAL | Status: DC

## 2013-09-26 MED ORDER — PREDNISONE 10 MG PO TABS: ORAL_TABLET | ORAL | Status: DC

## 2013-09-26 MED ORDER — PROMETHAZINE-CODEINE 6.25-10 MG/5ML PO SYRP: 5.0000 mL | ORAL_SOLUTION | ORAL | Status: DC | PRN

## 2013-09-26 MED ORDER — LEVOFLOXACIN 500 MG PO TABS: 500.0000 mg | ORAL_TABLET | Freq: Every day | ORAL | Status: DC

## 2013-09-26 MED ORDER — BUDESONIDE-FORMOTEROL FUMARATE 160-4.5 MCG/ACT IN AERO: 2.0000 | INHALATION_SPRAY | Freq: Two times a day (BID) | RESPIRATORY_TRACT | Status: DC

## 2013-09-26 NOTE — Telephone Encounter (Signed)
Called spoke w/ pt. appt scheduled to see RA this afternoon

## 2013-09-26 NOTE — Assessment & Plan Note (Signed)
Prednisone 10 mg tabs  Take 2 tabs daily with food x 7ds, then 1 tab daily with food x 7ds then STOP Levaquin 500 daily x 10 days - take probiotic with this Stay on reflux therapy - zegerid & protonix Trial of symbicort 160 - 2 puffs bid (sample) instead of qvar - rinse mouth after use Stay on codeine cough syrup - refill ok x 1 GI referral for reflux I will discuss maxillary sinusitis with Dr Pollyann Kennedyosen

## 2013-09-26 NOTE — Progress Notes (Signed)
   Subjective:    Patient ID: [name], male    DOB: Mar 11, 1955, 58 y.o.   MRN: [id number]  HPI  PCP - K shaw  58 year old remote smoker ( quit 95) with bipolar disorder for FU of chronic cough He reports several weeks of cough and shortness of breath, intermittently since October 2014. He would get relief for a few weeks and symptoms would start again.  He smoked about 20-30 pack years before quitting in 1995.  He works as a Oceanographerpublisher.   Significant tests/ events   CT of the chest 04/11/13 showed bilateral lower lobe infiltrates compatible with multifocal pneumonia. and mediastinal lymphadenopathy, as a result patient was directly admitted. A right lower lobe nodule 8mm. Was also noted  He was treated with Rocephin and Zithromax , IV Solu-Medrol, nebulized bronchodilators and other antitussives. Tessalon was not helpful.  A respiratory virus panel was positive for respiratory syncytial virus and metapneumovirus   FU CT scan of the chest 05/30/13 showed improving infiltrates, nodule and the mediastinal Nodes.  06/12/13 Spirometry - no airway obstruction, FEV1 90%  08/03/13 CT sinuses >>Lobular soft tissue fills much of the lower portion of the right maxillary sinus   Med history -  Empiric therapy for  sinus drip & reflux >>  Did not Take chlorpheniramine 4mg  at bedtime with sudafed 60 mg due to psych issues  - DELSYm daily did not help much  Took protonix daily for reflux     09/26/2013  Chief Complaint  Patient presents with  . Acute Visit    Pt c/o prod cough-green-black phlem. Constant cough. C/o wheezing, chest tx, increase SOB. Pt reports b/w every breathe he is coughing.    C/o green sputum C/o fatigue from excessive coughing  He has received 3-4  rounds of antibiotics and steroids in the last 2 months  promethazine-codeine 5ml PO qhs is the only med that has  Provided relief & could sleep Has seen Dr. Pollyann Kennedyosen , ENT , CT sinus reveiwed No fevers,  CXR s/o RLL  infiltrate  Past Medical History  Diagnosis Date  . Substance abuse   . Anxiety   . Mental disorder     ADD, bipolar  . Depression   . Pneumonia 04/12/2013      Review of Systems neg for any significant sore throat, dysphagia, itching, sneezing, nasal congestion or excess/ purulent secretions, fever, chills, sweats, unintended wt loss, pleuritic or exertional cp, hempoptysis, orthopnea pnd or change in chronic leg swelling. Also denies presyncope, palpitations, heartburn, abdominal pain, nausea, vomiting, diarrhea or change in bowel or urinary habits, dysuria,hematuria, rash, arthralgias, visual complaints, headache, numbness weakness or ataxia.     Objective:   Physical Exam  Gen. Pleasant, well-nourished, in no distress, normal affect ENT - no lesions, no post nasal drip Neck: No JVD, no thyromegaly, no carotid bruits Lungs: no use of accessory muscles, no dullness to percussion, clear without rales, BL scattered rhonchi after coughing Cardiovascular: Rhythm regular, heart sounds  normal, no murmurs or gallops, no peripheral edema Abdomen: soft and non-tender, no hepatosplenomegaly, BS normal. Musculoskeletal: No deformities, no cyanosis or clubbing Neuro:  alert, non focal       Assessment & Plan:

## 2013-09-26 NOTE — Patient Instructions (Signed)
Prednisone 10 mg tabs  Take 2 tabs daily with food x 7ds, then 1 tab daily with food x 7ds then STOP Levaquin 500 daily x 10 days - take probiotic with this Stay on reflux therapy - zegerid & protonix Trial of symbicort 160 - 2 puffs bid (sample) instead of qvar - rinse mouth after use Stay on codeine cough syrup - refill ok x 1 GI referral for reflux I will discuss maxillary sinusitis with Dr Rosen 

## 2013-09-27 ENCOUNTER — Other Ambulatory Visit (HOSPITAL_COMMUNITY): Payer: Self-pay | Admitting: Pulmonary Disease

## 2013-09-27 DIAGNOSIS — J320 Chronic maxillary sinusitis: Secondary | ICD-10-CM | POA: Insufficient documentation

## 2013-09-27 DIAGNOSIS — K219 Gastro-esophageal reflux disease without esophagitis: Secondary | ICD-10-CM | POA: Insufficient documentation

## 2013-09-27 DIAGNOSIS — R1310 Dysphagia, unspecified: Secondary | ICD-10-CM

## 2013-09-27 NOTE — Assessment & Plan Note (Signed)
I will discuss maxillary sinusitis with Dr Pollyann Kennedyosen Will drainage help?

## 2013-09-27 NOTE — Assessment & Plan Note (Signed)
UGI series continue PPI   Add reglan 10 bid with meals

## 2013-09-30 ENCOUNTER — Telehealth: Payer: Self-pay | Admitting: Internal Medicine

## 2013-09-30 NOTE — Telephone Encounter (Signed)
Patient called stating still not feelnig well; had to leave work. Feels dizzy; wonders if hypxoemia related. Offered office visit 8/117/15 work in or advised go to ER/uirgent care now. He did not like either option but verbalized if he got worse he would go to er

## 2013-10-02 ENCOUNTER — Telehealth: Payer: Self-pay | Admitting: Internal Medicine

## 2013-10-02 ENCOUNTER — Ambulatory Visit (HOSPITAL_COMMUNITY)
Admission: RE | Admit: 2013-10-02 | Discharge: 2013-10-02 | Disposition: A | Discharge status: Home / Self Care | Payer: 59 | Source: Ambulatory Visit | Attending: Pulmonary Disease | Admitting: Pulmonary Disease

## 2013-10-02 DIAGNOSIS — R1310 Dysphagia, unspecified: Secondary | ICD-10-CM

## 2013-10-02 DIAGNOSIS — R918 Other nonspecific abnormal finding of lung field: Secondary | ICD-10-CM

## 2013-10-02 DIAGNOSIS — K219 Gastro-esophageal reflux disease without esophagitis: Secondary | ICD-10-CM | POA: Diagnosis not present

## 2013-10-02 DIAGNOSIS — J690 Pneumonitis due to inhalation of food and vomit: Secondary | ICD-10-CM | POA: Diagnosis not present

## 2013-10-02 DIAGNOSIS — J320 Chronic maxillary sinusitis: Secondary | ICD-10-CM

## 2013-10-02 NOTE — Procedures (Signed)
Objective Swallowing Evaluation: Modified Barium Swallowing Study  Patient Details  Name: [name] MRN: [id number] Date of Birth: 1955/12/26  Today's Date: 10/02/2013 Time: 1300-1330 SLP Time Calculation (min): 30 min  Past Medical History:  Past Medical History  Diagnosis Date  . Substance abuse   . Anxiety   . Mental disorder     ADD, bipolar  . Depression   . Pneumonia 04/12/2013   Past Surgical History:  Past Surgical History  Procedure Laterality Date  . Appendectomy    . Colonoscopy     HPI:  58 year old male referred for outpatient MBS due to chronic cough and recurrent PNA, to objectively evaluate swallow function and safety.     Assessment / Plan / Recommendation Clinical Impression  Dysphagia Diagnosis: Within Functional Limits Clinical impression: Normal oral and pharyngeal swallow function. No post-swallow residue, penetration, aspiration or backflow observed with any consistency tested.    Treatment Recommendation  No treatment recommended at this time    Diet Recommendation Thin liquid;Regular   Liquid Administration via: Cup;Straw Medication Administration: Whole meds with liquid Supervision: Patient able to self feed Compensations: Slow rate;Small sips/bites;Follow solids with liquid Postural Changes and/or Swallow Maneuvers: Seated upright 90 degrees;Upright 30-60 min after meal    Other  Recommendations Oral Care Recommendations: Oral care BID   Follow Up Recommendations  None    Frequency and Duration   n/a     Pertinent Vitals/Pain VSS, no pain reported    SLP Swallow Goals     General Date of Onset: 11/16/12 HPI: 58 year old male referred for outpatient MBS due to chronic cough and recurrent PNA, to objectively evaluate swallow function and safety. Type of Study: Modified Barium Swallowing Study Reason for Referral: Objectively evaluate swallowing function Previous Swallow Assessment: none Diet Prior to this Study: Regular;Thin  liquids Temperature Spikes Noted: No Respiratory Status: Room air History of Recent Intubation: No Behavior/Cognition: Alert;Cooperative;Pleasant mood Oral Cavity - Dentition: Adequate natural dentition Oral Motor / Sensory Function: Within functional limits Self-Feeding Abilities: Able to feed self Patient Positioning: Upright in chair Baseline Vocal Quality: Clear Volitional Cough: Strong Volitional Swallow: Able to elicit Anatomy: Within functional limits Pharyngeal Secretions: Not observed secondary MBS    Reason for Referral Objectively evaluate swallowing function   Oral Phase Oral Preparation/Oral Phase Oral Phase: WFL   Pharyngeal Phase Pharyngeal Phase Pharyngeal Phase: Within functional limits  Cervical Esophageal Phase    GO    Cervical Esophageal Phase Cervical Esophageal Phase: [facility]FL    Functional Assessment Tool Used: ASHA NOMS, clinical judgment Functional Limitations: Swallowing Swallow Current Status (W0981(G8996): 0 percent impaired, limited or restricted Swallow Goal Status (X9147(G8997): 0 percent impaired, limited or restricted Swallow Discharge Status (W2956(G8998): 0 percent impaired, limited or restricted   Zachary B. LagrangeBueche, Mccurtain Memorial HospitalMSP, CCC-SLP [phone]  Zachary AuroraBueche, [name] 10/02/2013, 1:32 [name]

## 2013-10-02 NOTE — Telephone Encounter (Signed)
PCP Lupita RaiderSHAW,KIMBERLEE, MD called. Patient feeling poortly. Chart reviewd  - recommended echo - she will order - recommended earlier fu with Dr Vassie LollAlva- will forward to him to see if patient needs CT or bronch; questions that came up in discussion with pcp. Kathy BreachRakesh can you see patient sooner ? He has been calling a lot  Thanks  Dr. Kalman ShanMurali Darryle Dennie, M.D., [facility] West-DavenportF.C.C.P Pulmonary and Critical Care Medicine Staff Physician [facility] [place] [facility] Pager: [phone], If no answer or between  15:00h - 7:00h: call [phone]  10/02/2013 12:01 PM

## 2013-10-03 ENCOUNTER — Other Ambulatory Visit (HOSPITAL_COMMUNITY): Payer: Self-pay | Admitting: Family Medicine

## 2013-10-03 DIAGNOSIS — R0609 Other forms of dyspnea: Secondary | ICD-10-CM

## 2013-10-03 DIAGNOSIS — R059 Cough, unspecified: Secondary | ICD-10-CM

## 2013-10-03 DIAGNOSIS — R0989 Other specified symptoms and signs involving the circulatory and respiratory systems: Secondary | ICD-10-CM

## 2013-10-03 DIAGNOSIS — R05 Cough: Secondary | ICD-10-CM

## 2013-10-03 NOTE — Telephone Encounter (Signed)
Called pt LMTCB x1 

## 2013-10-03 NOTE — Telephone Encounter (Signed)
Please let them know- Swallowing test was normal. I discussed with ENT- dr Pollyann Kennedyosen, advised repeat CT sinuses after antibiotic course. Also, repeat CT chest with contrast at the same time Schedule office visit after these tests

## 2013-10-03 NOTE — Telephone Encounter (Signed)
Pt is scheduled to have scans done 10/06/13 in the afternoon. Pt scheduled to see RA 10/11/13. FYI

## 2013-10-03 NOTE — Telephone Encounter (Signed)
Spoke with pt. Orders placed. Once scheduled please let me know so I can schedule pt OV afterwards. Thanks [facility]s

## 2013-10-04 ENCOUNTER — Ambulatory Visit (HOSPITAL_COMMUNITY)
Admission: RE | Admit: 2013-10-04 | Discharge: 2013-10-04 | Disposition: A | Discharge status: Home / Self Care | Payer: 59 | Source: Ambulatory Visit | Attending: Family Medicine | Admitting: Family Medicine

## 2013-10-04 DIAGNOSIS — R059 Cough, unspecified: Secondary | ICD-10-CM

## 2013-10-04 DIAGNOSIS — R0989 Other specified symptoms and signs involving the circulatory and respiratory systems: Secondary | ICD-10-CM | POA: Diagnosis present

## 2013-10-04 DIAGNOSIS — R0609 Other forms of dyspnea: Secondary | ICD-10-CM | POA: Insufficient documentation

## 2013-10-04 DIAGNOSIS — R05 Cough: Secondary | ICD-10-CM

## 2013-10-04 NOTE — Progress Notes (Signed)
  Echocardiogram 2D Echocardiogram has been performed.  [name], [name] 10/04/2013, 2:33 PM

## 2013-10-06 ENCOUNTER — Ambulatory Visit (INDEPENDENT_AMBULATORY_CARE_PROVIDER_SITE_OTHER)
Admission: RE | Admit: 2013-10-06 | Discharge: 2013-10-06 | Disposition: A | Discharge status: Home / Self Care | Payer: 59 | Source: Ambulatory Visit | Attending: Pulmonary Disease | Admitting: Pulmonary Disease

## 2013-10-06 ENCOUNTER — Encounter: Payer: Self-pay | Admitting: Pulmonary Disease

## 2013-10-06 ENCOUNTER — Ambulatory Visit
Admission: RE | Admit: 2013-10-06 | Discharge: 2013-10-06 | Disposition: A | Discharge status: Home / Self Care | Payer: 59 | Source: Ambulatory Visit | Attending: Pulmonary Disease | Admitting: Pulmonary Disease

## 2013-10-06 ENCOUNTER — Telehealth: Payer: Self-pay | Admitting: Pulmonary Disease

## 2013-10-06 DIAGNOSIS — R918 Other nonspecific abnormal finding of lung field: Secondary | ICD-10-CM

## 2013-10-06 DIAGNOSIS — J320 Chronic maxillary sinusitis: Secondary | ICD-10-CM

## 2013-10-06 MED ORDER — IOHEXOL 300 MG/ML  SOLN
80.0000 mL | Freq: Once | INTRAMUSCULAR | Status: AC | PRN
  Administered 2013-10-06: 80 mL via INTRAVENOUS

## 2013-10-06 MED ORDER — PROMETHAZINE-CODEINE 6.25-10 MG/5ML PO SYRP: 5.0000 mL | ORAL_SOLUTION | ORAL | Status: DC | PRN

## 2013-10-06 NOTE — Telephone Encounter (Signed)
Called RX into [facility]. Pt aware. Nothing further needed

## 2013-10-06 NOTE — Telephone Encounter (Signed)
Spoke to pt Discussed ct results Bronchoscopy scheduled for Tuesday 7-30 am -[facility], npo after MN OK to refill cough syrup - send to [facility] at [address]/ [address]

## 2013-10-06 NOTE — Telephone Encounter (Signed)
Called spoke with pt. He is asking for refill on cough syrup since he is still coughing. Last refilled 09/26/13 #120 ml qhs prn Pt is scheduled for CT scan today. Please advise RA thanks  No Known Allergies

## 2013-10-08 ENCOUNTER — Encounter: Payer: Self-pay | Admitting: Pulmonary Disease

## 2013-10-09 ENCOUNTER — Other Ambulatory Visit: Payer: Self-pay | Admitting: Pulmonary Disease

## 2013-10-10 ENCOUNTER — Encounter (HOSPITAL_COMMUNITY)
Admission: RE | Disposition: A | Discharge status: Home / Self Care | Payer: 59 | Source: Ambulatory Visit | Attending: Pulmonary Disease

## 2013-10-10 ENCOUNTER — Ambulatory Visit (HOSPITAL_COMMUNITY)
Admission: RE | Admit: 2013-10-10 | Discharge: 2013-10-10 | Disposition: A | Discharge status: Home / Self Care | Payer: 59 | Source: Ambulatory Visit | Attending: Pulmonary Disease | Admitting: Pulmonary Disease

## 2013-10-10 ENCOUNTER — Telehealth: Payer: Self-pay | Admitting: Pulmonary Disease

## 2013-10-10 ENCOUNTER — Encounter: Payer: Self-pay | Admitting: Pulmonary Disease

## 2013-10-10 DIAGNOSIS — Z8701 Personal history of pneumonia (recurrent): Secondary | ICD-10-CM | POA: Diagnosis not present

## 2013-10-10 DIAGNOSIS — K219 Gastro-esophageal reflux disease without esophagitis: Secondary | ICD-10-CM | POA: Diagnosis not present

## 2013-10-10 DIAGNOSIS — Z79899 Other long term (current) drug therapy: Secondary | ICD-10-CM | POA: Diagnosis not present

## 2013-10-10 DIAGNOSIS — R222 Localized swelling, mass and lump, trunk: Secondary | ICD-10-CM | POA: Insufficient documentation

## 2013-10-10 DIAGNOSIS — Z87891 Personal history of nicotine dependence: Secondary | ICD-10-CM | POA: Diagnosis not present

## 2013-10-10 DIAGNOSIS — R059 Cough, unspecified: Secondary | ICD-10-CM | POA: Insufficient documentation

## 2013-10-10 DIAGNOSIS — R05 Cough: Secondary | ICD-10-CM | POA: Insufficient documentation

## 2013-10-10 DIAGNOSIS — F319 Bipolar disorder, unspecified: Secondary | ICD-10-CM | POA: Insufficient documentation

## 2013-10-10 DIAGNOSIS — R918 Other nonspecific abnormal finding of lung field: Secondary | ICD-10-CM

## 2013-10-10 HISTORY — PX: VIDEO BRONCHOSCOPY: SHX5072

## 2013-10-10 SURGERY — BRONCHOSCOPY, WITH FLUOROSCOPY
Anesthesia: Moderate Sedation | Laterality: Bilateral

## 2013-10-10 MED ORDER — FENTANYL CITRATE 0.05 MG/ML IJ SOLN
INTRAMUSCULAR | Status: AC
  Filled 2013-10-10: qty 4

## 2013-10-10 MED ORDER — BUTAMBEN-TETRACAINE-BENZOCAINE 2-2-14 % EX AERO: 1.0000 | INHALATION_SPRAY | Freq: Once | CUTANEOUS | Status: DC

## 2013-10-10 MED ORDER — LIDOCAINE HCL 2 % EX GEL: 1.0000 "application " | Freq: Once | CUTANEOUS | Status: DC

## 2013-10-10 MED ORDER — MORPHINE SULFATE 4 MG/ML IJ SOLN
INTRAMUSCULAR | Status: AC
  Filled 2013-10-10: qty 1

## 2013-10-10 MED ORDER — SODIUM CHLORIDE 0.9 % IV SOLN
INTRAVENOUS | Status: DC
  Administered 2013-10-10: 08:00:00 via INTRAVENOUS

## 2013-10-10 MED ORDER — FENTANYL CITRATE 0.05 MG/ML IJ SOLN
INTRAMUSCULAR | Status: DC | PRN
  Administered 2013-10-10 (×6): 25 ug via INTRAVENOUS

## 2013-10-10 MED ORDER — PHENYLEPHRINE HCL 0.25 % NA SOLN
1.0000 | Freq: Four times a day (QID) | NASAL | Status: DC | PRN
Start: 2013-10-10 — End: 2013-10-10

## 2013-10-10 MED ORDER — MORPHINE SULFATE 4 MG/ML IJ SOLN
1.0000 mg | Freq: Once | INTRAMUSCULAR | Status: AC
  Administered 2013-10-10: 1 mg via INTRAVENOUS

## 2013-10-10 MED ORDER — PHENYLEPHRINE HCL 0.25 % NA SOLN
NASAL | Status: DC | PRN
  Administered 2013-10-10: 2 via NASAL

## 2013-10-10 MED ORDER — LIDOCAINE HCL 2 % EX GEL
CUTANEOUS | Status: DC | PRN
  Administered 2013-10-10: 1

## 2013-10-10 MED ORDER — MIDAZOLAM HCL 5 MG/ML IJ SOLN
INTRAMUSCULAR | Status: DC | PRN
  Administered 2013-10-10 (×5): 1 mg via INTRAVENOUS

## 2013-10-10 MED ORDER — MIDAZOLAM HCL 5 MG/ML IJ SOLN
INTRAMUSCULAR | Status: AC
  Filled 2013-10-10: qty 2

## 2013-10-10 MED ORDER — LIDOCAINE HCL 1 % IJ SOLN
INTRAMUSCULAR | Status: DC | PRN
  Administered 2013-10-10: 6 mL via RESPIRATORY_TRACT

## 2013-10-10 NOTE — Op Note (Addendum)
Indication : Recurrent RLL pneumonia & endobronchial lesion in this ex smoker. Written informed consent was obtained prior to the procedure. The risks of the procedure including coughing, bleeding and the small chance of lung puncture requiring chest tube were discussed in great detail. The benefits & alternatives including serial follow up were also discussed.  6 mg versed & 150  mcg fentnayl used in divided doses during the procedure Bronchoscope entered from the right nare. Upper airway nml Vocal cords showed nml appearance & motion. Trachea & bronchial tree examined to the subsegmental level. Minimal amount of clearsecretions were noted. 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 Brushings & BAL was obtained from this area. This mass seemed to ball-valve into the segmental bronchi indicating that it was not tethered to the bronchus. Endo bronchial biopsies x 3 were obtained from the RLL but vision was obscured by incessant coughing & bleeding. BAL was also obtained from the RLL.  There was severe coughing  during the procedure. BP was elevated prior to & after the procedure. Based on results of biopsies, he will be referred to thoracic surgery for removal of this mass, I favor foreign body rather than endobronchial tumor based on appearance & mobility.  Mousa Prout V.  [phone]

## 2013-10-10 NOTE — Telephone Encounter (Signed)
Please call in protonix daily x30 x 2 refills for him TCTS appt requested Pl cancel his appt with me & dr wert & reschedule with me in 3-4 weeks

## 2013-10-10 NOTE — Discharge Instructions (Signed)
Flexible Bronchoscopy, Care After °These instructions give you information on caring for yourself after your procedure. Your doctor may also give you more specific instructions. Call your doctor if you have any problems or questions after your procedure. °HOME CARE °· Do not eat or drink anything for 2 hours after your procedure. If you try to eat or drink before the medicine wears off, food or drink could go into your lungs. You could also burn yourself. °· After 2 hours have passed and when you can cough and gag normally, you may eat soft food and drink liquids slowly. °· The day after the test, you may eat your normal diet. °· You may do your normal activities. °· Keep all doctor visits. °GET HELP RIGHT AWAY IF: °· You get more and more short of breath. °· You get light-headed. °· You feel like you are going to pass out (faint). °· You have chest pain. °· You have new problems that worry you. °· You cough up more than a little blood. °· You cough up more blood than before. °MAKE SURE YOU: °· Understand these instructions. °· Will watch your condition. °· Will get help right away if you are not doing well or get worse. °Document Released: 11/30/2008 Document Revised: 02/07/2013 Document Reviewed: 10/07/2012 °ExitCare® Patient Information ©2015 [employer]. This information is not intended to replace advice given to you by your health care provider. Make sure you discuss any questions you have with your health care provider. ° °

## 2013-10-10 NOTE — Progress Notes (Addendum)
Video Bronchoscopy performed  Biopsy Intervention performed Brushing Intervention performed Washing Intervention performed  Pt tolerated well

## 2013-10-10 NOTE — Telephone Encounter (Signed)
I spoke with the pt and he wanted to make sure his appt with Dr. Vassie Loll for tomorrow is cancelled and wanted to check on appt with [facility]. I advised order has been placed to [facility] and we will contact him with an appt. Appt cancelled for tomorrow. [name], CMA

## 2013-10-10 NOTE — Interval H&P Note (Signed)
[name] has presented today for procedure, with the diagnosis of recurrent pneumonia. The various methods of treatment have been discussed with the patient and family. After consideration of risks, benefits and other options for treatment, the patient has consented to Procedure(s) :  Video bronchoscopy with possible biopsy .  The patient's history has been reviewed, patient examined, no change in status, stable for surgery. I have reviewed the patient's chart and labs. Questions were answered to the patient's satisfaction

## 2013-10-10 NOTE — H&P (View-Only) (Signed)
   Subjective:    Patient ID: [name], male    DOB: July 24, 1955, 58 y.o.   MRN: [id number]  HPI  PCP - K shaw  58 year old remote smoker ( quit 95) with bipolar disorder for FU of chronic cough He reports several weeks of cough and shortness of breath, intermittently since October 2014. He would get relief for a few weeks and symptoms would start again.  He smoked about 20-30 pack years before quitting in 1995.  He works as a Oceanographer.   Significant tests/ events   CT of the chest 04/11/13 showed bilateral lower lobe infiltrates compatible with multifocal pneumonia. and mediastinal lymphadenopathy, as a result patient was directly admitted. A right lower lobe nodule 8mm. Was also noted  He was treated with Rocephin and Zithromax , IV Solu-Medrol, nebulized bronchodilators and other antitussives. Tessalon was not helpful.  A respiratory virus panel was positive for respiratory syncytial virus and metapneumovirus   FU CT scan of the chest 05/30/13 showed improving infiltrates, nodule and the mediastinal Nodes.  06/12/13 Spirometry - no airway obstruction, FEV1 90%  08/03/13 CT sinuses >>Lobular soft tissue fills much of the lower portion of the right maxillary sinus   Med history -  Empiric therapy for  sinus drip & reflux >>  Did not Take chlorpheniramine  at bedtime with sudafed 60 mg due to psych issues  - DELSYm daily did not help much  Took protonix daily for reflux     09/26/2013  Chief Complaint  Patient presents with  . Acute Visit    Pt c/o prod cough-green-black phlem. Constant cough. C/o wheezing, chest tx, increase SOB. Pt reports b/w every breathe he is coughing.    C/o green sputum C/o fatigue from excessive coughing  He has received 3-4  rounds of antibiotics and steroids in the last 2 months  promethazine-codeine 5ml PO qhs is the only med that has  Provided relief & could sleep Has seen Dr. Pollyann Kennedy , ENT , CT sinus reveiwed No fevers,  CXR s/o RLL  infiltrate  Past Medical History  Diagnosis Date  . Substance abuse   . Anxiety   . Mental disorder     ADD, bipolar  . Depression   . Pneumonia 04/12/2013      Review of Systems neg for any significant sore throat, dysphagia, itching, sneezing, nasal congestion or excess/ purulent secretions, fever, chills, sweats, unintended wt loss, pleuritic or exertional cp, hempoptysis, orthopnea pnd or change in chronic leg swelling. Also denies presyncope, palpitations, heartburn, abdominal pain, nausea, vomiting, diarrhea or change in bowel or urinary habits, dysuria,hematuria, rash, arthralgias, visual complaints, headache, numbness weakness or ataxia.     Objective:   Physical Exam  Gen. Pleasant, well-nourished, in no distress, normal affect ENT - no lesions, no post nasal drip Neck: No JVD, no thyromegaly, no carotid bruits Lungs: no use of accessory muscles, no dullness to percussion, clear without rales, BL scattered rhonchi after coughing Cardiovascular: Rhythm regular, heart sounds  normal, no murmurs or gallops, no peripheral edema Abdomen: soft and non-tender, no hepatosplenomegaly, BS normal. Musculoskeletal: No deformities, no cyanosis or clubbing Neuro:  alert, non focal       Assessment & Plan:

## 2013-10-11 ENCOUNTER — Encounter (HOSPITAL_COMMUNITY): Payer: Self-pay | Admitting: Pulmonary Disease

## 2013-10-11 ENCOUNTER — Ambulatory Visit: Payer: 59 | Admitting: Pulmonary Disease

## 2013-10-11 LAB — CULTURE, BAL-QUANTITATIVE: Colony Count: 70000

## 2013-10-11 LAB — CULTURE, BAL-QUANTITATIVE W GRAM STAIN

## 2013-10-12 ENCOUNTER — Encounter (HOSPITAL_COMMUNITY): Payer: Self-pay | Admitting: *Deleted

## 2013-10-12 ENCOUNTER — Encounter: Payer: Self-pay | Admitting: Cardiothoracic Surgery

## 2013-10-12 ENCOUNTER — Encounter (HOSPITAL_COMMUNITY): Payer: Self-pay | Admitting: Pharmacy Technician

## 2013-10-12 ENCOUNTER — Other Ambulatory Visit (HOSPITAL_COMMUNITY): Payer: Self-pay | Admitting: *Deleted

## 2013-10-12 ENCOUNTER — Other Ambulatory Visit: Payer: Self-pay

## 2013-10-12 ENCOUNTER — Institutional Professional Consult (permissible substitution) (INDEPENDENT_AMBULATORY_CARE_PROVIDER_SITE_OTHER): Payer: 59 | Admitting: Cardiothoracic Surgery

## 2013-10-12 VITALS — BP 136/84 | HR 92 | Resp 20 | Ht 71.0 in | Wt 244.0 lb

## 2013-10-12 DIAGNOSIS — R911 Solitary pulmonary nodule: Secondary | ICD-10-CM

## 2013-10-12 MED ORDER — HYDROCOD POLST-CHLORPHEN POLST 10-8 MG/5ML PO LQCR: 5.0000 mL | Freq: Two times a day (BID) | ORAL | Status: DC | PRN

## 2013-10-12 NOTE — Progress Notes (Signed)
[address]       [place] [address]             [phone]                    Andris B Beckerman [place] Medical Record #[id number] Date of Birth: 01-16-1956  Referring: Rigoberto Noel, MD Primary Care: Mayra Neer, MD  Chief Complaint:    Chief Complaint  Patient presents with  . Lung Lesion    Surgical eval      History of Present Illness:    [name] 58 y.o. male is seen in the office  today for cough that has been intermittent for almost one year. In February 2015 the patient had a CT scan and was hospitalized for right lower lobe pneumonia. He has had several bouts of recurrent right lower lobe pneumonia and has been persistently bothered by cough. Evaluation has included multiple episodes of antibiotics and steroids, and a total of 3 CT scans of the chest. Several days ago a bronchoscopy was done raising the issue of a foreign body in the right lower lobe bronchus. The patient is referred to thoracic surgery for evaluation and treatment. The patient denies any hemoptysis. He notes that he has coughed to the point where it has disrupted his sleep in his ability to function. He has a previous history of excessive alcohol use and has not been drinking for the past 2 years. He's not aware of any definite foreign bodies that have been aspirated.       Current Activity/ Functional Status:  Patient is independent with mobility/ambulation, transfers, ADL's, IADL's.   Zubrod Score: At the time of surgery this patient's most appropriate activity status/level should be described as: _0     0    Normal activity, no symptoms _1     1    Restricted in physical strenuous activity but ambulatory, able to do out light work _2     2    Ambulatory and capable of self care, unable to do work activities, up and about               >50 % of waking hours                              _3     3    Only limited self care, in bed greater than 50% of waking hours _4     4     Completely disabled, no self care, confined to bed or chair _5     5    Moribund   Past Medical History  Diagnosis Date  . Substance abuse   . Anxiety   . Mental disorder     ADD, bipolar  . Depression   . Pneumonia 04/12/2013    Past Surgical History  Procedure Laterality Date  . Appendectomy    . Colonoscopy    . Video bronchoscopy Bilateral 10/10/2013    Procedure: VIDEO BRONCHOSCOPY WITH FLUORO;  Surgeon: Rigoberto Noel, MD;  Location: [facility];  Service: Cardiopulmonary;  Laterality: Bilateral;    Family History : Patient does not have full details of his fall his medical history but notes that he was alcoholic and died at age 79 of probable cirrhosis, his mother died of breast cancer at age 5, has 3 siblings [phone] with no known health problems  History   Social History  .  Marital Status: Married    Spouse Name: N/A    Number of Children: N/A  . Years of Education: N/A   Occupational History  .  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 Smoking status: Former Smoker -- 1.00 packs/day for 20 years    Types: Cigarettes    Quit date: 02/16/1993  . Smokeless tobacco: Never Used  . Alcohol Use: Yes     Comment: pint a day  . Drug Use: No  . Sexual Activity: Yes   Other Topics Concern  . Not on file   Social History Narrative  . No narrative on file    History  Smoking status  . Former Smoker -- 1.00 packs/day for 20 years  . Types: Cigarettes  . Quit date: 02/16/1993  Smokeless tobacco  . Never Used    History  Alcohol Use  . Yes    Comment: pint a day     No Known Allergies  Current Outpatient Prescriptions  Medication Sig Dispense Refill  . budesonide-formoterol (SYMBICORT) 160-4.5 MCG/ACT inhaler Inhale 2 puffs into the lungs 2 (two) times daily.  1 Inhaler  0  . chlorpheniramine (CHLOR-TRIMETON) 4 MG tablet Take 4 mg by mouth every 4 (four) hours as needed for allergies.      Marland Kitchen  lamoTRIgine (LAMICTAL) 150 MG tablet Take 150 mg by mouth at bedtime. Mood /depression      . metoCLOPramide (REGLAN) 10 MG tablet Take 1 tablet twice a day 1 hr prior to meals  60 tablet  0  . Omeprazole-Sodium Bicarbonate (ZEGERID OTC PO) Take by mouth daily.      . pantoprazole (PROTONIX) 40 MG tablet Take 1 tablet (40 mg total) by mouth daily.  30 tablet  0  . traZODone (DESYREL) 50 MG tablet Take 50 mg by mouth at bedtime as needed for sleep.       . Vilazodone HCl (VIIBRYD) 10 & 20 & 40 MG KIT Take 40 mg by mouth daily.        No current facility-administered medications for this visit.     Review of Systems:     Cardiac Review of Systems: Y or N  Chest Pain [  n  ]  Resting SOB [ n  ] Exertional SOB  [ y ]  Orthopnea [  ]   Pedal Edema [n   ]    Palpitations Florencio.Farrier  ] Syncope  [ n ]   Presyncope [ n  ]  General Review of Systems: [Y] = yes [  ]=no Constitional: recent weight change [  ];  Wt loss over the last 3 months [   ] anorexia [  ]; fatigue Blue.Reese  ]; nausea [  ]; night sweats [  ]; fever [  ]; or chills [  ];          Dental: poor dentition[ n ]; Last Dentist visit:   Eye : blurred vision [  ]; diplopia [   ]; vision changes [  ];  Amaurosis fugax[  ]; Resp: cough Blue.Reese  ];  wheezing[ y ];  hemoptysis[n  ]; shortness of breathy[  ]; paroxysmal nocturnal dyspnea[ y ]; dyspnea on exertion[ y ]; or orthopnea[  ];  GI:  gallstones[  ], vomiting[  ];  dysphagia[  ]; melena[  ];  hematochezia [  ]; heartburn[  ];   Hx of  Colonoscopy[  ]; GU: kidney  stones Florencio.Farrier  ]; hematuria[ n ];   dysuria [  ];  nocturia[  ];  history of     obstruction [  ]; urinary frequency [ n ]             Skin: rash, swelling[  ];, hair loss[  ];  peripheral edema[  ];  or itching[  ]; Musculosketetal: myalgias[ n ];  joint swelling[  ];  joint erythema[  ];  joint pain[  ];  back pain[  ];  Heme/Lymph: bruising[  ];  bleeding[  ];  anemia[  ];  Neuro: TIA[  ];  headaches[ n ];  stroke[ n ];  vertigo[  ];  seizures[  ];    paresthesias[  ];  difficulty walking[  n];  Psych:depression[y  ]; anxiety[y  ];  Endocrine: diabetes[  ];  thyroid dysfunction[  ];  Immunizations: Flu up to date [  ]; Pneumococcal up to date [  ];  Other:  Physical Exam: BP 136/84  Pulse 92  Resp 20  Ht _0  (1.803 m)  Wt 244 lb (110.678 kg)  BMI 34.05 kg/m2  SpO2 93%  PHYSICAL EXAMINATION:  General appearance: alert and cooperative Neurologic: intact Heart: regular rate and rhythm, S1, S2 normal, no murmur, click, rub or gallop Lungs: clear to auscultation bilaterally and normal percussion bilaterally Abdomen: soft, non-tender; bowel sounds normal; no masses,  no organomegaly Extremities: extremities normal, atraumatic, no cyanosis or edema and Homans sign is negative, no sign of DVT Patient has no carotid bruits, has full DP and PT pulses bilaterally   Diagnostic Studies & Laboratory data:     Recent Radiology Findings:   Dg Chest 2 View  09/26/2013   CLINICAL DATA:  Cough  EXAM: CHEST  2 VIEW  COMPARISON:  Chest radiograph 05/12/2013.  FINDINGS: Stable cardiac and mediastinal contours. Interval development of heterogeneous opacities right lung base. No pleural effusion or pneumothorax. Regional skeleton is unremarkable.  IMPRESSION: New heterogeneous opacities right lung base may represent infection in the appropriate clinical setting. Short-term radiographic followup to ensure resolution is recommended.   Electronically Signed   By: Lovey Newcomer M.D.   On: 09/26/2013 16:06   Ct Chest W Contrast  10/06/2013   CLINICAL DATA:  Pulmonary infiltrates.  Cough.  Recurrent pneumonia.  EXAM: CT CHEST WITH CONTRAST  TECHNIQUE: Multidetector CT imaging of the chest was performed during intravenous contrast administration.  CONTRAST:  70m OMNIPAQUE IOHEXOL 300 MG/ML  SOLN  COMPARISON:  Chest x-ray 09/26/2013.  Chest CT 05/29/2013.  FINDINGS: There is a 13 mm rounded filling defect noted within the right lower lobe bronchus, seen on  axial image 36 and coronal image 75. Associated right lower lobe airspace opacities with air bronchograms and reticulonodular densities. This likely reflects a postobstructive process, possibly pneumonia.  Small subpleural nodule in the left lower lobe on image 43 measures 4 mm. Otherwise left lung is clear. No pleural effusions or pericardial effusion.  Heart is normal size. Aorta is normal caliber. High right paratracheal lymph node has a short axis diameter of 9 mm. Subcarinal lymph node has a short axis diameter of 8 mm. Right hilar lymph node mildly enlarged with a short axis diameter of 14 mm. No axillary adenopathy.  Chest wall soft tissues are unremarkable. Imaging into the upper abdomen shows no acute findings. Tiny hypodensity in the hepatic dome, most likely small cyst.  No acute bony abnormality or focal bone lesion.  IMPRESSION: 13 mm endobronchial soft  tissue within the right lower lobe bronchus near its bifurcation. While this could reflect mucous, the appearance and the postobstructive process in the right lower lobe along with mildly enlarged right hilar and mediastinal lymph nodes is concerning for and endobronchial lesion/ tumor. Consider further evaluation with bronchoscopy.  These results will be called to the ordering clinician or representative by the Radiologist Assistant, and communication documented in the PACS or zVision Dashboard.   Electronically Signed   By: Rolm Baptise M.D.   On: 10/06/2013 15:03   Dg Swallowing Func-speech Pathology  10/02/2013   Lorre Nick, CCC-SLP     10/02/2013  1:32 PM Objective Swallowing Evaluation: Modified Barium Swallowing Study   Patient Details  Name: [name] MRN: [id number] Date of Birth: 10/11/1955  Today's Date: 10/02/2013 Time: 1300-1330 SLP Time Calculation (min): 30 min  Past Medical History:  Past Medical History  Diagnosis Date  . Substance abuse   . Anxiety   . Mental disorder     ADD, bipolar  . Depression   . Pneumonia 04/12/2013   Past  Surgical History:  Past Surgical History  Procedure Laterality Date  . Appendectomy    . Colonoscopy     HPI:  58 year old male referred for outpatient MBS due to chronic cough  and recurrent PNA, to objectively evaluate swallow function and  safety.     Assessment / Plan / Recommendation Clinical Impression  Dysphagia Diagnosis: Within Functional Limits Clinical impression: Normal oral and pharyngeal swallow function.  No post-swallow residue, penetration, aspiration or backflow  observed with any consistency tested.    Treatment Recommendation  No treatment recommended at this time    Diet Recommendation Thin liquid;Regular   Liquid Administration via: Cup;Straw Medication Administration: Whole meds with liquid Supervision: Patient able to self feed Compensations: Slow rate;Small sips/bites;Follow solids with  liquid Postural Changes and/or Swallow Maneuvers: Seated upright 90  degrees;Upright 30-60 min after meal    Other  Recommendations Oral Care Recommendations: Oral care BID   Follow Up Recommendations  None    Frequency and Duration   n/a     Pertinent Vitals/Pain VSS, no pain reported    SLP Swallow Goals     General Date of Onset: 11/16/12 HPI: 58 year old male referred for outpatient MBS due to chronic  cough and recurrent PNA, to objectively evaluate swallow function  and safety. Type of Study: Modified Barium Swallowing Study Reason for Referral: Objectively evaluate swallowing function Previous Swallow Assessment: none Diet Prior to this Study: Regular;Thin liquids Temperature Spikes Noted: No Respiratory Status: Room air History of Recent Intubation: No Behavior/Cognition: Alert;Cooperative;Pleasant mood Oral Cavity - Dentition: Adequate natural dentition Oral Motor / Sensory Function: Within functional limits Self-Feeding Abilities: Able to feed self Patient Positioning: Upright in chair Baseline Vocal Quality: Clear Volitional Cough: Strong Volitional Swallow: Able to elicit Anatomy: Within  functional limits Pharyngeal Secretions: Not observed secondary MBS    Reason for Referral Objectively evaluate swallowing function   Oral Phase Oral Preparation/Oral Phase Oral Phase: WFL   Pharyngeal Phase Pharyngeal Phase Pharyngeal Phase: Within functional limits  Cervical Esophageal Phase    GO    Cervical Esophageal Phase Cervical Esophageal Phase: [employer]    Functional Assessment Tool Used: ASHA NOMS, clinical judgment Functional Limitations: Swallowing Swallow Current Status (O9629): 0 percent impaired, limited or  restricted Swallow Goal Status (B2841): 0 percent impaired, limited or  restricted Swallow Discharge Status (L2440): 0 percent impaired, limited or  restricted   [facility]  Renda Rolls, [place], CCC-SLP [phone]  Shonna Chock 10/02/2013, 1:32 PM    [place] [facility] Cm  10/06/2013   CLINICAL DATA:  Sinusitis.  Recurrent pneumonia.  Chronic cough.  EXAM: CT PARANASAL SINUS WITHOUT CONTRAST  TECHNIQUE: Multidetector CT images of the paranasal sinuses were obtained using the standard protocol without intravenous contrast.  COMPARISON:  CT paranasal sinuses 08/01/2013.  FINDINGS: Minimal mucosal thickening noted in the frontal and ethmoid sinuses, unchanged. Again noted is a prominent mucous retention cyst right maxillary sinus. Mucosal thickening right maxillary sinus. These findings have progressed slightly from prior exam. Small mucous retention cyst left maxillary sinus. Sphenoid sinuses clear. Maxillary ostia incompletely imaged. No prominent concha bullosa deformity noted. Nasal septal deviation to the left present. No acute bony abnormality.  IMPRESSION: 1. Bilateral maxillary sinus mucous retention cysts particularly prominent on the right. Prominent mucosal thickening right maxillary sinus. These findings are consistent with chronic sinus disease. These findings have progressed slightly from prior exam. 2. No evidence of acute sinusitis.   Electronically Signed   By: Marcello Moores  Register   On:  10/06/2013 14:49      Recent Lab Findings: Lab Results  Component Value Date   WBC 10.9* 07/28/2013   HGB 13.9 07/28/2013   HCT 40.5 07/28/2013   PLT 268.0 07/28/2013   GLUCOSE 142* 04/13/2013   ALT 18 04/12/2013   AST 24 04/12/2013   NA 130* 04/13/2013   K 4.8 04/13/2013   CL 92* 04/13/2013   CREATININE 0.78 04/13/2013   BUN 13 04/13/2013   CO2 25 04/13/2013      Assessment / Plan:      Cough and recurrent right lobe pneumonia with mobile endobronchial lesion right lower lobe, bronchoscopy done by pulmonology several days before noted that the mass was very movable and thought potentially to be a foreign body. I recommended to the patient that we proceed with bronchoscopy under general anesthesia to fully evaluate the situation, to determine if the mass in question is a bronchial lesion and needs resection or as a foreign body. We will plan this for tomorrow   I spent 55 minutes counseling the patient face to face. The total time spent in the appointment was 60 minutes.  Grace Isaac MD      [address] [place] [address] Office [phone]   Beeper [phone]  10/12/2013 1:21 PM

## 2013-10-13 ENCOUNTER — Encounter (HOSPITAL_COMMUNITY): Payer: Self-pay | Admitting: *Deleted

## 2013-10-13 ENCOUNTER — Ambulatory Visit (HOSPITAL_COMMUNITY): Payer: 59

## 2013-10-13 ENCOUNTER — Ambulatory Visit (HOSPITAL_COMMUNITY)
Admission: RE | Admit: 2013-10-13 | Discharge: 2013-10-13 | Disposition: A | Discharge status: Home / Self Care | Payer: 59 | Source: Ambulatory Visit | Attending: Cardiothoracic Surgery | Admitting: Cardiothoracic Surgery

## 2013-10-13 ENCOUNTER — Encounter (HOSPITAL_COMMUNITY): Payer: 59 | Admitting: Anesthesiology

## 2013-10-13 ENCOUNTER — Encounter (HOSPITAL_COMMUNITY)
Admission: RE | Disposition: A | Discharge status: Home / Self Care | Payer: Self-pay | Source: Ambulatory Visit | Attending: Cardiothoracic Surgery

## 2013-10-13 ENCOUNTER — Ambulatory Visit (HOSPITAL_COMMUNITY): Payer: 59 | Admitting: Anesthesiology

## 2013-10-13 DIAGNOSIS — T17408A Unspecified foreign body in trachea causing other injury, initial encounter: Secondary | ICD-10-CM | POA: Insufficient documentation

## 2013-10-13 DIAGNOSIS — T17808A Unspecified foreign body in other parts of respiratory tract causing other injury, initial encounter: Secondary | ICD-10-CM

## 2013-10-13 DIAGNOSIS — R911 Solitary pulmonary nodule: Secondary | ICD-10-CM

## 2013-10-13 DIAGNOSIS — F329 Major depressive disorder, single episode, unspecified: Secondary | ICD-10-CM | POA: Diagnosis not present

## 2013-10-13 DIAGNOSIS — IMO0002 Reserved for concepts with insufficient information to code with codable children: Secondary | ICD-10-CM | POA: Diagnosis not present

## 2013-10-13 DIAGNOSIS — F988 Other specified behavioral and emotional disorders with onset usually occurring in childhood and adolescence: Secondary | ICD-10-CM | POA: Diagnosis not present

## 2013-10-13 DIAGNOSIS — F3289 Other specified depressive episodes: Secondary | ICD-10-CM | POA: Insufficient documentation

## 2013-10-13 DIAGNOSIS — K219 Gastro-esophageal reflux disease without esophagitis: Secondary | ICD-10-CM | POA: Diagnosis not present

## 2013-10-13 DIAGNOSIS — T17508A Unspecified foreign body in bronchus causing other injury, initial encounter: Secondary | ICD-10-CM | POA: Insufficient documentation

## 2013-10-13 DIAGNOSIS — F411 Generalized anxiety disorder: Secondary | ICD-10-CM | POA: Diagnosis not present

## 2013-10-13 DIAGNOSIS — J984 Other disorders of lung: Secondary | ICD-10-CM | POA: Diagnosis present

## 2013-10-13 DIAGNOSIS — Y929 Unspecified place or not applicable: Secondary | ICD-10-CM | POA: Diagnosis not present

## 2013-10-13 DIAGNOSIS — Z87891 Personal history of nicotine dependence: Secondary | ICD-10-CM | POA: Diagnosis not present

## 2013-10-13 HISTORY — DX: Shortness of breath: R06.02

## 2013-10-13 HISTORY — DX: Other specified behavioral and emotional disorders with onset usually occurring in childhood and adolescence: F98.8

## 2013-10-13 HISTORY — DX: Gastro-esophageal reflux disease without esophagitis: K21.9

## 2013-10-13 HISTORY — DX: Bipolar disorder, unspecified: F31.9

## 2013-10-13 HISTORY — PX: VIDEO BRONCHOSCOPY: SHX5072

## 2013-10-13 LAB — PROTIME-INR
INR: 1.02 (ref 0.00–1.49)
Prothrombin Time: 13.4 seconds (ref 11.6–15.2)

## 2013-10-13 LAB — CBC
HCT: 39 % (ref 39.0–52.0)
Hemoglobin: 13.6 g/dL (ref 13.0–17.0)
MCH: 31.2 pg (ref 26.0–34.0)
MCHC: 34.9 g/dL (ref 30.0–36.0)
MCV: 89.4 fL (ref 78.0–100.0)
Platelets: 226 10*3/uL (ref 150–400)
RBC: 4.36 MIL/uL (ref 4.22–5.81)
RDW: 11.9 % (ref 11.5–15.5)
WBC: 14.1 10*3/uL — ABNORMAL HIGH (ref 4.0–10.5)

## 2013-10-13 LAB — COMPREHENSIVE METABOLIC PANEL
ALT: 15 U/L (ref 0–53)
AST: 15 U/L (ref 0–37)
Albumin: 3.5 g/dL (ref 3.5–5.2)
Alkaline Phosphatase: 91 U/L (ref 39–117)
Anion gap: 13 (ref 5–15)
BUN: 10 mg/dL (ref 6–23)
CO2: 23 mEq/L (ref 19–32)
Calcium: 8.9 mg/dL (ref 8.4–10.5)
Chloride: 93 mEq/L — ABNORMAL LOW (ref 96–112)
Creatinine, Ser: 0.89 mg/dL (ref 0.50–1.35)
GFR calc Af Amer: >90 mL/min (ref >90)
GFR calc non Af Amer: >90 mL/min (ref >90)
Glucose, Bld: 111 mg/dL — ABNORMAL HIGH (ref 70–99)
Potassium: 4.3 mEq/L (ref 3.7–5.3)
Sodium: 129 mEq/L — ABNORMAL LOW (ref 137–147)
Total Bilirubin: 1.2 mg/dL (ref 0.3–1.2)
Total Protein: 6.9 g/dL (ref 6.0–8.3)

## 2013-10-13 LAB — TYPE AND SCREEN
ABO/RH(D): O NEG
Antibody Screen: NEGATIVE

## 2013-10-13 LAB — ABO/RH: ABO/RH(D): O NEG

## 2013-10-13 LAB — APTT: aPTT: 33 seconds (ref 24–37)

## 2013-10-13 SURGERY — BRONCHOSCOPY, VIDEO-ASSISTED
Anesthesia: General | Site: Bronchus | Laterality: Right

## 2013-10-13 MED ORDER — DEXAMETHASONE SODIUM PHOSPHATE 10 MG/ML IJ SOLN
INTRAMUSCULAR | Status: AC
  Filled 2013-10-13: qty 1

## 2013-10-13 MED ORDER — PROPOFOL 10 MG/ML IV BOLUS
INTRAVENOUS | Status: DC | PRN
  Administered 2013-10-13: 140 mg via INTRAVENOUS

## 2013-10-13 MED ORDER — EPINEPHRINE HCL 1 MG/ML IJ SOLN
INTRAMUSCULAR | Status: AC
  Filled 2013-10-13: qty 2

## 2013-10-13 MED ORDER — ONDANSETRON HCL 4 MG/2ML IJ SOLN
INTRAMUSCULAR | Status: DC | PRN
  Administered 2013-10-13: 4 mg via INTRAVENOUS

## 2013-10-13 MED ORDER — LIDOCAINE HCL 4 % MT SOLN
OROMUCOSAL | Status: DC | PRN
  Administered 2013-10-13: 4 mL via TOPICAL

## 2013-10-13 MED ORDER — LACTATED RINGERS IV SOLN
INTRAVENOUS | Status: DC | PRN
  Administered 2013-10-13 (×2): via INTRAVENOUS

## 2013-10-13 MED ORDER — FENTANYL CITRATE 0.05 MG/ML IJ SOLN
INTRAMUSCULAR | Status: DC | PRN
  Administered 2013-10-13 (×3): 50 ug via INTRAVENOUS
  Administered 2013-10-13: 100 ug via INTRAVENOUS

## 2013-10-13 MED ORDER — VECURONIUM BROMIDE 10 MG IV SOLR
INTRAVENOUS | Status: DC | PRN
  Administered 2013-10-13 (×3): 2 mg via INTRAVENOUS
  Administered 2013-10-13 (×2): 1 mg via INTRAVENOUS
  Administered 2013-10-13 (×3): 2 mg via INTRAVENOUS

## 2013-10-13 MED ORDER — ARTIFICIAL TEARS OP OINT
TOPICAL_OINTMENT | OPHTHALMIC | Status: DC | PRN
  Administered 2013-10-13: 1 via OPHTHALMIC

## 2013-10-13 MED ORDER — GLYCOPYRROLATE 0.2 MG/ML IJ SOLN
INTRAMUSCULAR | Status: DC | PRN
  Administered 2013-10-13: .8 mg via INTRAVENOUS

## 2013-10-13 MED ORDER — LIDOCAINE HCL (CARDIAC) 20 MG/ML IV SOLN
INTRAVENOUS | Status: DC | PRN
  Administered 2013-10-13: 40 mg via INTRAVENOUS

## 2013-10-13 MED ORDER — PHENYLEPHRINE HCL 10 MG/ML IJ SOLN
10.0000 mg | INTRAVENOUS | Status: DC | PRN
  Administered 2013-10-13: 50 ug/min via INTRAVENOUS

## 2013-10-13 MED ORDER — EPINEPHRINE HCL 1 MG/ML IJ SOLN
INTRAMUSCULAR | Status: DC | PRN
  Administered 2013-10-13: 1 mg

## 2013-10-13 MED ORDER — OXYCODONE HCL 5 MG/5ML PO SOLN: 5.0000 mg | Freq: Once | ORAL | Status: DC | PRN

## 2013-10-13 MED ORDER — ROCURONIUM BROMIDE 100 MG/10ML IV SOLN
INTRAVENOUS | Status: DC | PRN
  Administered 2013-10-13: 10 mg via INTRAVENOUS
  Administered 2013-10-13: 40 mg via INTRAVENOUS

## 2013-10-13 MED ORDER — OXYCODONE HCL 5 MG PO TABS: 5.0000 mg | ORAL_TABLET | Freq: Once | ORAL | Status: DC | PRN

## 2013-10-13 MED ORDER — LACTATED RINGERS IV SOLN: INTRAVENOUS | Status: DC

## 2013-10-13 MED ORDER — FENTANYL CITRATE 0.05 MG/ML IJ SOLN
INTRAMUSCULAR | Status: AC
  Filled 2013-10-13: qty 5

## 2013-10-13 MED ORDER — MIDAZOLAM HCL 2 MG/2ML IJ SOLN
INTRAMUSCULAR | Status: AC
  Filled 2013-10-13: qty 2

## 2013-10-13 MED ORDER — CEFUROXIME SODIUM 750 MG IJ SOLR
INTRAMUSCULAR | Status: AC
  Filled 2013-10-13: qty 750

## 2013-10-13 MED ORDER — NEOSTIGMINE METHYLSULFATE 10 MG/10ML IV SOLN
INTRAVENOUS | Status: DC | PRN
  Administered 2013-10-13: 5 mg via INTRAVENOUS

## 2013-10-13 MED ORDER — DEXAMETHASONE SODIUM PHOSPHATE 10 MG/ML IJ SOLN
INTRAMUSCULAR | Status: DC | PRN
  Administered 2013-10-13: 10 mg via INTRAVENOUS

## 2013-10-13 MED ORDER — MIDAZOLAM HCL 5 MG/5ML IJ SOLN
INTRAMUSCULAR | Status: DC | PRN
  Administered 2013-10-13 (×2): 1 mg via INTRAVENOUS

## 2013-10-13 MED ORDER — EPHEDRINE SULFATE 50 MG/ML IJ SOLN
INTRAMUSCULAR | Status: DC | PRN
  Administered 2013-10-13: 10 mg via INTRAVENOUS
  Administered 2013-10-13: 5 mg via INTRAVENOUS
  Administered 2013-10-13: 10 mg via INTRAVENOUS

## 2013-10-13 MED ORDER — DEXTROSE 5 % IV SOLN
1.5000 g | INTRAVENOUS | Status: AC
  Administered 2013-10-13: 1.5 g via INTRAVENOUS
  Filled 2013-10-13: qty 1.5

## 2013-10-13 MED ORDER — HYDROMORPHONE HCL PF 1 MG/ML IJ SOLN: 0.2500 mg | INTRAMUSCULAR | Status: DC | PRN

## 2013-10-13 MED ORDER — PHENYLEPHRINE HCL 10 MG/ML IJ SOLN
INTRAMUSCULAR | Status: DC | PRN
  Administered 2013-10-13 (×3): 40 ug via INTRAVENOUS

## 2013-10-13 MED ORDER — LEVOFLOXACIN 500 MG PO TABS: 500.0000 mg | ORAL_TABLET | Freq: Every day | ORAL | Status: DC

## 2013-10-13 SURGICAL SUPPLY — 24 items
BASKET PULM ZERO TIP 12X120 (MISCELLANEOUS) ×4 IMPLANT
BASKET PULM ZERO TIP 16X120 (BASKET) ×4 IMPLANT
BRUSH CYTOL CELLEBRITY 1.5X140 (MISCELLANEOUS) IMPLANT
BSKT SPEC RTRVL ZERO TP 120X16 (BASKET) ×2
CANISTER SUCTION 2500CC (MISCELLANEOUS) ×4 IMPLANT
CONT SPEC 4OZ CLIKSEAL STRL BL (MISCELLANEOUS) ×8 IMPLANT
COVER TABLE BACK 60X90 (DRAPES) ×4 IMPLANT
FORCEPS BIOP RJ4 1.8 (CUTTING FORCEPS) ×4 IMPLANT
FORCEPS RADIAL JAW LRG 4 PULM (INSTRUMENTS) ×3 IMPLANT
GAUZE SPONGE 4X4 12PLY STRL (GAUZE/BANDAGES/DRESSINGS) ×4 IMPLANT
GLOVE BIO SURGEON STRL SZ 6.5 (GLOVE) ×24 IMPLANT
GOWN STRL REUS W/ TWL LRG LVL3 (GOWN DISPOSABLE) ×15 IMPLANT
GOWN STRL REUS W/TWL LRG LVL3 (GOWN DISPOSABLE) ×15
KIT ROOM TURNOVER OR (KITS) ×4 IMPLANT
MARKER SKIN DUAL TIP RULER LAB (MISCELLANEOUS) ×4 IMPLANT
NEEDLE BIOPSY TRANSBRONCH 21G (NEEDLE) IMPLANT
NS IRRIG 1000ML POUR BTL (IV SOLUTION) ×4 IMPLANT
OIL SILICONE PENTAX (PARTS (SERVICE/REPAIRS)) ×4 IMPLANT
RADIAL JAW LRG 4 PULMONARY (INSTRUMENTS) ×1
SNARE SHORT THROW 13M SML OVAL (MISCELLANEOUS) ×4 IMPLANT
SYR 20ML ECCENTRIC (SYRINGE) ×8 IMPLANT
TOWEL OR 17X24 6PK STRL BLUE (TOWEL DISPOSABLE) ×4 IMPLANT
TRAP SPECIMEN MUCOUS 40CC (MISCELLANEOUS) ×4 IMPLANT
TUBE CONNECTING 12X1/4 (SUCTIONS) ×8 IMPLANT

## 2013-10-13 NOTE — Discharge Instructions (Signed)
Flexible Bronchoscopy, Care After ° °Refer to this sheet in the next few weeks. These instructions provide you with information on caring for yourself after your procedure. Your health care provider may also give you more specific instructions. Your treatment has been planned according to current medical practices, but problems sometimes occur. Call your health care provider if you have any problems or questions after your procedure.  °WHAT TO EXPECT AFTER THE PROCEDURE °It is normal to have the following symptoms for 24-48 hours after the procedure:  °· Increased cough. °· Low-grade fever. °· Sore throat or hoarse voice. °· Small streaks of blood in your thick spit (sputum) if tissue samples were taken (biopsy). °HOME CARE INSTRUCTIONS  °· Do not eat or drink anything for 2 hours after your procedure. Your nose and throat were numbed by medicine. If you try to eat or drink before the medicine wears off, food or drink could go into your lungs or you could burn yourself. After the numbness is gone and your cough and gag reflexes have returned, you may eat soft food and drink liquids slowly.   °· The day after the procedure, you can go back to your normal diet.   °· You may resume normal activities.   °· Keep all follow-up visits as directed by your health care provider. It is important to keep all your appointments, especially if tissue samples were taken for testing (biopsy). °SEEK IMMEDIATE MEDICAL CARE IF:  °· You have increasing shortness of breath.   °· You become light-headed or faint.   °· You have chest pain.   °· You have any new concerning symptoms. °· You cough up more than a small amount of blood. °· The amount of blood you cough up increases. °MAKE SURE YOU: °· Understand these instructions. °· Will watch your condition. °· Will get help right away if you are not doing well or get worse. °Document Released: 08/22/2004 Document Revised: 06/19/2013 Document Reviewed: 10/07/2012 °ExitCare® Patient  Information ©2015 [employer]. This information is not intended to replace advice given to you by your health care provider. Make sure you discuss any questions you have with your health care provider. ° °

## 2013-10-13 NOTE — Transfer of Care (Signed)
Immediate Anesthesia Transfer of Care Note  Patient: [name]  Procedure(s) Performed: Procedure(s): VIDEO BRONCHOSCOPY, REMOVAL OF ENDOBRONCHIAL LESION, PROBABLE FOREIGN BODY (N/A)  Patient Location: PACU  Anesthesia Type:General  Level of Consciousness: awake and alert   Airway & Oxygen Therapy: Patient Spontanous Breathing, Patient connected to nasal cannula oxygen and Patient connected to face mask oxygen  Post-op Assessment: Report given to PACU RN, Post -op Vital signs reviewed and stable and Patient moving all extremities  Post vital signs: Reviewed and stable  Complications: No apparent anesthesia complications

## 2013-10-13 NOTE — Anesthesia Preprocedure Evaluation (Addendum)
Anesthesia Evaluation  Patient identified by MRN, date of birth, ID band Patient awake    Reviewed: Allergy & Precautions, H&P , NPO status   Airway Mallampati: II TM Distance: >3 FB Neck ROM: Full    Dental  (+) Teeth Intact, Dental Advisory Given Half of tooth chipped right upper molar:   Pulmonary shortness of breath, pneumonia -, unresolved, former smoker,  breath sounds clear to auscultation        Cardiovascular Rhythm:Regular Rate:Normal     Neuro/Psych Anxiety Depression Bipolar Disorder    GI/Hepatic GERD-  Medicated,  Endo/Other    Renal/GU      Musculoskeletal   Abdominal   Peds  (+) ATTENTION 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 Hematology   Anesthesia Other Findings   Reproductive/Obstetrics                        Anesthesia Physical Anesthesia Plan  ASA: III  Anesthesia Plan: General   Post-op Pain Management:    Induction: Intravenous  Airway Management Planned: Oral ETT  Additional Equipment:   Intra-op Plan:   Post-operative Plan: Extubation in OR and Possible Post-op intubation/ventilation  Informed Consent: I have reviewed the patients History and Physical, chart, labs and discussed the procedure including the risks, benefits and alternatives for the proposed anesthesia with the patient or authorized representative who has indicated his/her understanding and acceptance.   Dental advisory given  Plan Discussed with: CRNA  Anesthesia Plan Comments:         Anesthesia Quick Evaluation

## 2013-10-13 NOTE — Anesthesia Postprocedure Evaluation (Signed)
  Anesthesia Post-op Note  Patient: [name]  Procedure(s) Performed: Procedure(s): VIDEO BRONCHOSCOPY, REMOVAL OF ENDOBRONCHIAL LESION, PROBABLE FOREIGN BODY (N/A)  Patient Location: PACU  Anesthesia Type:General  Level of Consciousness: awake and alert   Airway and Oxygen Therapy: Patient Spontanous Breathing  Post-op Pain: mild  Post-op Assessment: Post-op Vital signs reviewed, Patient's Cardiovascular Status Stable and Respiratory Function Stable  Post-op Vital Signs: Reviewed  Filed Vitals:   10/13/13 1515  BP: 130/71  Pulse: 87  Temp:   Resp: 14    Complications: No apparent anesthesia complications

## 2013-10-13 NOTE — Progress Notes (Signed)
   Intra-op images 

## 2013-10-13 NOTE — H&P (Signed)
[address]       [place] [address]             [phone]                    [name] [facility] Medical Record #[id number] Date of Birth: September 04, 1955  Referring: Dr Vassie Loll Primary Care: [name], MD  Chief Complaint:    cough   History of Present Illness:    [name] 58 y.o. male is seen in the office  today for cough that has been intermittent for almost one year. In February 2015 the patient had a CT scan and was hospitalized for right lower lobe pneumonia. He has had several bouts of recurrent right lower lobe pneumonia and has been persistently bothered by cough. Evaluation has included multiple episodes of antibiotics and steroids, and a total of 3 CT scans of the chest. Several days ago a bronchoscopy was done raising the issue of a foreign body in the right lower lobe bronchus. The patient is referred to thoracic surgery for evaluation and treatment. The patient denies any hemoptysis. He notes that he has coughed to the point where it has disrupted his sleep in his ability to function. He has a previous history of excessive alcohol use and has not been drinking for the past 2 years. He's not aware of any definite foreign bodies that have been aspirated.       Current Activity/ Functional Status:  Patient is independent with mobility/ambulation, transfers, ADL's, IADL's.   Zubrod Score: At the time of surgery this patient's most appropriate activity status/level should be described as: []     0    Normal activity, no symptoms [x]     1    Restricted in physical strenuous activity but ambulatory, able to do out light work []     2    Ambulatory and capable of self care, unable to do work activities, up and about               >50 % of waking hours                              []     3    Only limited self care, in bed greater than 50% of waking hours []     4    Completely disabled, no self care, confined to bed or chair []     5     Moribund   Past Medical History  Diagnosis Date  . Substance abuse   . Mental disorder     ADD, bipolar  . Depression   . Shortness of breath   . Anxiety     Panic  . Bipolar disorder   . ADD (attention deficit disorder)   . GERD (gastroesophageal reflux disease)     ? they thought GERD caused cough.   . Pneumonia 04/12/2013    Past Surgical History  Procedure Laterality Date  . Colonoscopy    . Video bronchoscopy Bilateral 10/10/2013    Procedure: VIDEO BRONCHOSCOPY WITH FLUORO;  Surgeon: Oretha Milch, MD;  Location: [facility] ENDOSCOPY;  Service: Cardiopulmonary;  Laterality: Bilateral;  . Appendectomy  1988  . Tonsillectomy      Family History : Patient does not have full details of his fall his medical history but notes that he was alcoholic and died at age 21 of probable cirrhosis, his  mother died of breast cancer at age 5, has 3 siblings 76 41 with no known health problems  History   Social History  . Marital Status: Married    Spouse Name: N/A    Number of Children: N/A  . Years of Education: N/A   Occupational History  .  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 Smoking status: Former Smoker -- 1.00 packs/day for 20 years    Types: Cigarettes    Quit date: 02/16/1993  . Smokeless tobacco: Never Used  . Alcohol Use: Yes     Comment: pint a day  . Drug Use: No  . Sexual Activity: Yes   Other Topics Concern  . Not on file   Social History Narrative  . No narrative on file    History  Smoking status  . Former Smoker -- 1.00 packs/day for 20 years  . Types: Cigarettes  . Quit date: 02/16/1993  Smokeless tobacco  . Never Used    History  Alcohol Use No    Comment: none at all     No Known Allergies  Current Facility-Administered Medications  Medication Dose Route Frequency Provider Last Rate Last Dose  . lactated ringers infusion   Intravenous Continuous Kennieth Rad, MD        Facility-Administered Medications Ordered in Other Encounters  Medication Dose Route Frequency Provider Last Rate Last Dose  . lactated ringers infusion    Continuous PRN Edmonia Caprio, CRNA         Review of Systems:     Cardiac Review of Systems: Y or N  Chest Pain [  n  ]  Resting SOB [ n  ] Exertional SOB  [ y ]  Orthopnea [  ]   Pedal Edema [n   ]    Palpitations Milo.Brash  ] Syncope  [ n ]   Presyncope [ n  ]  General Review of Systems: [Y] = yes [  ]=no Constitional: recent weight change [  ];  Wt loss over the last 3 months [   ] anorexia [  ]; fatigue Cove.Etienne  ]; nausea [  ]; night sweats [  ]; fever [  ]; or chills [  ];          Dental: poor dentition[ n ]; Last Dentist visit:   Eye : blurred vision [  ]; diplopia [   ]; vision changes [  ];  Amaurosis fugax[  ]; Resp: cough Cove.Etienne  ];  wheezing[ y ];  hemoptysis[n  ]; shortness of breathy[  ]; paroxysmal nocturnal dyspnea[ y ]; dyspnea on exertion[ y ]; or orthopnea[  ];  GI:  gallstones[  ], vomiting[  ];  dysphagia[  ]; melena[  ];  hematochezia [  ]; heartburn[  ];   Hx of  Colonoscopy[  ]; GU: kidney stones [n  ]; hematuria[ n ];   dysuria [  ];  nocturia[  ];  history of     obstruction [  ]; urinary frequency [ n ]             Skin: rash, swelling[  ];, hair loss[  ];  peripheral edema[  ];  or itching[  ]; Musculosketetal: myalgias[ n ];  joint swelling[  ];  joint erythema[  ];  joint pain[  ];  back pain[  ];  Heme/Lymph: bruising[  ];  bleeding[  ];  anemia[  ];  Neuro: TIA[  ];  headaches[ n ];  stroke[ n ];  vertigo[  ];  seizures[  ];   paresthesias[  ];  difficulty walking[  n];  Psych:depression[y  ]; anxiety[y  ];  Endocrine: diabetes[  ];  thyroid dysfunction[  ];  Immunizations: Flu up to date [  ]; Pneumococcal up to date [  ];  Other:  Physical Exam: BP 122/80  Pulse 106  Temp(Src) 98.8 F (37.1 C) (Oral)  Resp 20  Ht  (1.778 m)  Wt 244 lb (110.678 kg)  BMI 35.01 kg/m2  SpO2 93%  PHYSICAL  EXAMINATION:  General appearance: alert and cooperative Neurologic: intact Heart: regular rate and rhythm, S1, S2 normal, no murmur, click, rub or gallop Lungs: clear to auscultation bilaterally and normal percussion bilaterally Abdomen: soft, non-tender; bowel sounds normal; no masses,  no organomegaly Extremities: extremities normal, atraumatic, no cyanosis or edema and Homans sign is negative, no sign of DVT Patient has no carotid bruits, has full DP and PT pulses bilaterally   Diagnostic Studies & Laboratory data:     Recent Radiology Findings: Dg Chest 2 View Within Previous 72 Hours.  Films Obtained On Friday Are Acceptable For Monday And Tuesday Cases  10/13/2013   CLINICAL DATA:  Right lung lesion.  EXAM: CHEST  2 VIEW  COMPARISON:  Chest CT, 10/06/2013.  Chest radiograph, 09/26/2013.  FINDINGS: There is wedge-shaped opacity in the posterior right lower lobe, most likely postobstructive atelectasis. This corresponds to the opacity noted on the current CT, but is new from the prior chest radiograph.  Remainder of the lungs is clear. No pleural effusion or pneumothorax. Cardiac silhouette is normal in size. Normal mediastinal and hilar contours.  Bony thorax is intact.  IMPRESSION: 1. Right lower lobe wedge-shaped opacity most likely atelectasis subsequent to the lower lobe bronchial obstruction. This is similar to the recent prior CT.   Electronically Signed   By: Amie Portland M.D.   On: 10/13/2013 08:52   Dg Swallowing Func-speech Pathology  10/02/2013   Gray Bernhardt, CCC-SLP     10/02/2013  1:32 PM Objective Swallowing Evaluation: Modified Barium Swallowing Study   Patient Details  Name: [name] MRN: [id number] Date of Birth: 1955-09-30  Today's Date: 10/02/2013 Time: 1300-1330 SLP Time Calculation (min): 30 min  Past Medical History:  Past Medical History  Diagnosis Date  . Substance abuse   . Anxiety   . Mental disorder     ADD, bipolar  . Depression   . Pneumonia 04/12/2013   Past  Surgical History:  Past Surgical History  Procedure Laterality Date  . Appendectomy    . Colonoscopy     HPI:  58 year old male referred for outpatient MBS due to chronic cough  and recurrent PNA, to objectively evaluate swallow function and  safety.     Assessment / Plan / Recommendation Clinical Impression  Dysphagia Diagnosis: Within Functional Limits Clinical impression: Normal oral and pharyngeal swallow function.  No post-swallow residue, penetration, aspiration or backflow  observed with any consistency tested.    Treatment Recommendation  No treatment recommended at this time    Diet Recommendation Thin liquid;Regular   Liquid Administration via: Cup;Straw Medication Administration: Whole meds with liquid Supervision: Patient able to self feed Compensations: Slow rate;Small sips/bites;Follow solids with  liquid Postural Changes and/or Swallow Maneuvers: Seated upright 90  degrees;Upright 30-60 min after meal    Other  Recommendations Oral Care Recommendations: Oral care  BID   Follow Up Recommendations  None    Frequency and Duration   n/a     Pertinent Vitals/Pain VSS, no pain reported    SLP Swallow Goals     General Date of Onset: 11/16/12 HPI: 58 year old male referred for outpatient MBS due to chronic  cough and recurrent PNA, to objectively evaluate swallow function  and safety. Type of Study: Modified Barium Swallowing Study Reason for Referral: Objectively evaluate swallowing function Previous Swallow Assessment: none Diet Prior to this Study: Regular;Thin liquids Temperature Spikes Noted: No Respiratory Status: Room air History of Recent Intubation: No Behavior/Cognition: Alert;Cooperative;Pleasant mood Oral Cavity - Dentition: Adequate natural dentition Oral Motor / Sensory Function: Within functional limits Self-Feeding Abilities: Able to feed self Patient Positioning: Upright in chair Baseline Vocal Quality: Clear Volitional Cough: Strong Volitional Swallow: Able to elicit Anatomy: Within  functional limits Pharyngeal Secretions: Not observed secondary MBS    Reason for Referral Objectively evaluate swallowing function   Oral Phase Oral Preparation/Oral Phase Oral Phase: WFL   Pharyngeal Phase Pharyngeal Phase Pharyngeal Phase: Within functional limits  Cervical Esophageal Phase    GO    Cervical Esophageal Phase Cervical Esophageal Phase: [facility]    Functional Assessment Tool Used: ASHA NOMS, clinical judgment Functional Limitations: Swallowing Swallow Current Status (Z6109): 0 percent impaired, limited or  restricted Swallow Goal Status (U0454): 0 percent impaired, limited or  restricted Swallow Discharge Status (U9811): 0 percent impaired, limited or  restricted   Celia B. [place], [facility], CCC-SLP [phone]  [name] 10/02/2013, 1:32 PM    Ct Maxillofacial Ltd Wo Cm  10/06/2013   CLINICAL DATA:  Sinusitis.  Recurrent pneumonia.  Chronic cough.  EXAM: CT PARANASAL SINUS WITHOUT CONTRAST  TECHNIQUE: Multidetector CT images of the paranasal sinuses were obtained using the standard protocol without intravenous contrast.  COMPARISON:  CT paranasal sinuses 08/01/2013.  FINDINGS: Minimal mucosal thickening noted in the frontal and ethmoid sinuses, unchanged. Again noted is a prominent mucous retention cyst right maxillary sinus. Mucosal thickening right maxillary sinus. These findings have progressed slightly from prior exam. Small mucous retention cyst left maxillary sinus. Sphenoid sinuses clear. Maxillary ostia incompletely imaged. No prominent concha bullosa deformity noted. Nasal septal deviation to the left present. No acute bony abnormality.  IMPRESSION: 1. Bilateral maxillary sinus mucous retention cysts particularly prominent on the right. Prominent mucosal thickening right maxillary sinus. These findings are consistent with chronic sinus disease. These findings have progressed slightly from prior exam. 2. No evidence of acute sinusitis.   Electronically Signed   By: Maisie Fus  Register   On:  10/06/2013 14:49    Dg Chest 2 View  Ct Chest W Contrast  10/06/2013   CLINICAL DATA:  Pulmonary infiltrates.  Cough.  Recurrent pneumonia.  EXAM: CT CHEST WITH CONTRAST  TECHNIQUE: Multidetector CT imaging of the chest was performed during intravenous contrast administration.  CONTRAST:  80mL OMNIPAQUE IOHEXOL 300 MG/ML  SOLN  COMPARISON:  Chest x-ray 09/26/2013.  Chest CT 05/29/2013.  FINDINGS: There is a 13 mm rounded filling defect noted within the right lower lobe bronchus, seen on axial image 36 and coronal image 75. Associated right lower lobe airspace opacities with air bronchograms and reticulonodular densities. This likely reflects a postobstructive process, possibly pneumonia.  Small subpleural nodule in the left lower lobe on image 43 measures 4 mm. Otherwise left lung is clear. No pleural effusions or pericardial effusion.  Heart is normal size. Aorta is normal caliber. High right paratracheal lymph node  has a short axis diameter of 9 mm. Subcarinal lymph node has a short axis diameter of 8 mm. Right hilar lymph node mildly enlarged with a short axis diameter of 14 mm. No axillary adenopathy.  Chest wall soft tissues are unremarkable. Imaging into the upper abdomen shows no acute findings. Tiny hypodensity in the hepatic dome, most likely small cyst.  No acute bony abnormality or focal bone lesion.  IMPRESSION: 13 mm endobronchial soft tissue within the right lower lobe bronchus near its bifurcation. While this could reflect mucous, the appearance and the postobstructive process in the right lower lobe along with mildly enlarged right hilar and mediastinal lymph nodes is concerning for and endobronchial lesion/ tumor. Consider further evaluation with bronchoscopy.  These results will be called to the ordering clinician or representative by the Radiologist Assistant, and communication documented in the PACS or zVision Dashboard.   Electronically Signed   By: Charlett Nose M.D.   On: 10/06/2013 15:03       Recent Lab Findings: Lab Results  Component Value Date   WBC 10.9* 07/28/2013   HGB 13.9 07/28/2013   HCT 40.5 07/28/2013   PLT 268.0 07/28/2013   GLUCOSE 142* 04/13/2013   ALT 18 04/12/2013   AST 24 04/12/2013   NA 130* 04/13/2013   K 4.8 04/13/2013   CL 92* 04/13/2013   CREATININE 0.78 04/13/2013   BUN 13 04/13/2013   CO2 25 04/13/2013      Assessment / Plan:      Cough and recurrent right lobe pneumonia with mobile endobronchial lesion right lower lobe, bronchoscopy done by pulmonology several days before noted that the mass was very movable and thought potentially to be a foreign body. I recommended to the patient that we proceed with bronchoscopy under general anesthesia to fully evaluate the situation, to determine if the mass in question is a bronchial lesion and needs resection or is  a foreign body.   The goals risks and alternatives of the planned surgical procedure bronchoscopy  have been discussed with the patient in detail. The risks of the procedure including death, infection, stroke, myocardial infarction, bleeding, blood transfusion have all been discussed specifically.  I have quoted [name] a 1 % of perioperative mortality and a complication rate as high as 10 %. The patient's questions have been answered.[name] is willing  to proceed with the planned procedure.   Delight Ovens MD      [address] [place] [place] [address] Office [phone]   Beeper [phone]  10/13/2013 9:13 AM

## 2013-10-13 NOTE — Brief Op Note (Addendum)
      [address]       [place] [address]             [phone]    10/13/2013  2:01 PM  PATIENT:  [name]  58 y.o. male  PRE-OPERATIVE DIAGNOSIS:  lung lesion  POST-OPERATIVE DIAGNOSIS:  lung lesion  PROCEDURE:  Procedure(s): VIDEO BRONCHOSCOPY, REMOVAL OF ENDOBRONCHIAL LESION, PROBABLE FOREIGN BODY (N/A) With fluro  SURGEON:  Surgeon(s) and Role:    * Delight Ovens, MD - Primary   ANESTHESIA:   general  EBL:  Total I/O In: 1000 [I.V.:1000] Out: -   BLOOD ADMINISTERED:none  DRAINS: none   LOCAL MEDICATIONS USED:  NONE  SPECIMEN:  Source of Specimen:  right lower lobe bronchus  DISPOSITION OF SPECIMEN:  PATHOLOGY  COUNTS:  YES   DICTATION: .Dragon Dictation  PLAN OF CARE: Discharge to home after PACU  PATIENT DISPOSITION:  PACU - hemodynamically stable.   Delay start of Pharmacological VTE agent (>24hrs) due to surgical blood loss or risk of bleeding: yes

## 2013-10-14 NOTE — Op Note (Signed)
NAMEGENEROSO, CROPPER NO.:  [id number]  MEDICAL RECORD NO.:  [id number]  LOCATION:  [facility]                         FACILITY:  [facility]  PHYSICIAN:  Sheliah Plane, MD    DATE OF BIRTH:  1955/11/06  DATE OF PROCEDURE:  10/13/2013 DATE OF DISCHARGE:  10/13/2013                              OPERATIVE REPORT   PREOPERATIVE DIAGNOSIS:  Recurrent right lower lobe pneumonia, question of endobronchial mass.  POSTOPERATIVE DIAGNOSIS:  Foreign material, vegetable matter, right lower lobe.  PROCEDURE PERFORMED:  Video bronchoscopy with removal of foreign material and debridement of granulation tissue with fluoroscopic guidance.  BRIEF HISTORY:  The patient is a 57 year old male, who for almost a year has had a persistent cough.  He has had multiple episodes of right lower lobe pneumonia.  He has had several CT scans.  He was referred by Pulmonology the day prior to surgery for bronchoscopy.  Risks and options were discussed with the patient.  DESCRIPTION OF PROCEDURE:  After appropriate time-out, the patient underwent general endotracheal anesthesia with a #9 single-lumen endotracheal tube.  Through this tube initially, a 2.8 mm fiberoptic bronchoscope was passed.  The right tracheobronchial tree appeared clear.  The left tracheobronchial tree appeared clear.  The right upper lobe was clear.  The right middle lobe was clear.  There was an area in the right lower lobe with inflammation.  This was carefully inspected and ultimately in the distal segments, an area of what appeared to be foreign material was seen.  With some difficulty, this was snared with a biopsy forceps and submitted for pathology and was noted to be a vegetable matter.  We then used a 2 mm scope to enter the bronchus and with a biopsy forceps, multiple fragments in this area were removed. Fluoroscopy __________ episode of __________ removal of all of __________ granulation tissue.  The scope was  removed.  The patient tolerated the procedure without obvious complication and was extubated in the operating room and transferred to the recovery room for further postoperative care.     Sheliah Plane, MD     EG/MEDQ  D:  10/14/2013  T:  10/14/2013  Job:  [id number]

## 2013-10-15 LAB — CULTURE, RESPIRATORY W GRAM STAIN

## 2013-10-16 ENCOUNTER — Telehealth: Payer: Self-pay | Admitting: Pulmonary Disease

## 2013-10-16 NOTE — Telephone Encounter (Signed)
Error

## 2013-10-16 NOTE — Telephone Encounter (Signed)
Spoke with the [name]  He had called requesting tussionex  Per chart review- Dr Sena Hitch refilled this for him on 10/12/13  [name] states that he will check with his spouse to see if she picked this up for him  He will call back if needed

## 2013-10-17 ENCOUNTER — Encounter (HOSPITAL_COMMUNITY): Payer: Self-pay | Admitting: Cardiothoracic Surgery

## 2013-10-30 ENCOUNTER — Ambulatory Visit (INDEPENDENT_AMBULATORY_CARE_PROVIDER_SITE_OTHER): Payer: 59 | Admitting: Physician Assistant

## 2013-10-30 ENCOUNTER — Other Ambulatory Visit: Payer: Self-pay | Admitting: *Deleted

## 2013-10-30 ENCOUNTER — Ambulatory Visit
Admission: RE | Admit: 2013-10-30 | Discharge: 2013-10-30 | Disposition: A | Discharge status: Home / Self Care | Payer: 59 | Source: Ambulatory Visit | Attending: Cardiothoracic Surgery | Admitting: Cardiothoracic Surgery

## 2013-10-30 VITALS — BP 129/83 | HR 96 | Ht 71.0 in | Wt 244.0 lb

## 2013-10-30 DIAGNOSIS — R059 Cough, unspecified: Secondary | ICD-10-CM

## 2013-10-30 DIAGNOSIS — T81509D Unspecified complication of foreign body accidentally left in body following unspecified procedure, subsequent encounter: Secondary | ICD-10-CM

## 2013-10-30 DIAGNOSIS — R053 Chronic cough: Secondary | ICD-10-CM

## 2013-10-30 DIAGNOSIS — R05 Cough: Secondary | ICD-10-CM

## 2013-10-30 NOTE — Progress Notes (Signed)
  HPI:  Patient returns for routine postoperative follow-up having undergone Video Bronchoscopy with removal of foreign body material revealed to be vegetable matter and biopsies that did not show evidence of malignancy on 10/13/2013.  Since his bronchoscopy procedure the patient states initially he was able to breath much better.  He still had a cough but states it had improved some.  However, over the past several days the patient states his cough is progressing again.  He continues to cough throughout the day and has to clear his throat.  He does have occasional green sputum production, but for the most part this is clear.  He denies fevers, chill, sweats.  He was accompanied by his wife who felt they should have Dr. Tyrone Sage and questioned why their appointment was rescheduled for today.  Current Outpatient Prescriptions  Medication Sig Dispense Refill  . atomoxetine (STRATTERA) 80 MG capsule Take 80 mg by mouth daily.      . budesonide-formoterol (SYMBICORT) 160-4.5 MCG/ACT inhaler Inhale 2 puffs into the lungs 2 (two) times daily.  1 Inhaler  0  . chlorpheniramine (CHLOR-TRIMETON) 4 MG tablet Take 4 mg by mouth every 4 (four) hours as needed for allergies.      . chlorpheniramine-HYDROcodone (TUSSIONEX) 10-8 MG/5ML LQCR Take 5 mLs by mouth every 12 (twelve) hours as needed for cough.  115 mL  0  . famotidine (PEPCID) 40 MG tablet Take 40 mg by mouth at bedtime.      Marland Kitchen ibuprofen (ADVIL,MOTRIN) 200 MG tablet Take 600 mg by mouth every 6 (six) hours as needed.      . lamoTRIgine (LAMICTAL) 150 MG tablet Take 150 mg by mouth at bedtime. Mood /depression Take with 25 mg tablet to equal 175 mg dose      . lamoTRIgine (LAMICTAL) 25 MG tablet Take 25 mg by mouth at bedtime. Take with 150 mg tablet to equal 175 mg dose.      . levofloxacin (LEVAQUIN) 500 MG tablet Take 1 tablet (500 mg total) by mouth daily.  7 tablet  0  . metoCLOPramide (REGLAN) 10 MG tablet Take 10 mg by mouth 2 (two) times daily.  Take one hour prior to meals      . Omeprazole-Sodium Bicarbonate (ZEGERID OTC PO) Take 1 tablet by mouth daily.       . pantoprazole (PROTONIX) 40 MG tablet Take 1 tablet (40 mg total) by mouth daily.  30 tablet  0  . traZODone (DESYREL) 100 MG tablet Take 100 mg by mouth at bedtime.      . Vilazodone HCl (VIIBRYD) 40 MG TABS Take 40 mg by mouth daily.       No current facility-administered medications for this visit.    Physical Exam:  BP 129/83  Pulse 96  Ht  (1.803 m)  Wt 244 lb (110.678 kg)  BMI 34.05 kg/m2  SpO2 96%  Gen: no apparent distress Heart: RRR Lungs: CTA bilaterally  Diagnostic Tests:  CXR: no pneumothorax, improvement of right lung opacity.  Resolution of left lung opacity.  A/P:  1. Chronic Cough- Bronchoscopy done showed inflammation and foreign body in right bronchus.  This was removed and Pathology did not show evidence of malignancy. 2. CXR shows improvement in overall appearance of lung opacities 3. RTC in 2 weeks with repeat CXR, at which time there can be a decision made regarding repeat Bronchoscopy vs monitoring if patient's symptoms improve

## 2013-10-31 ENCOUNTER — Encounter: Payer: Self-pay | Admitting: Family Medicine

## 2013-10-31 ENCOUNTER — Encounter: Payer: Self-pay | Admitting: Pulmonary Disease

## 2013-10-31 NOTE — Telephone Encounter (Signed)
Please check how his cough is doing.

## 2013-11-02 ENCOUNTER — Ambulatory Visit: Payer: 59 | Admitting: Cardiothoracic Surgery

## 2013-11-06 ENCOUNTER — Ambulatory Visit: Payer: 59 | Admitting: Pulmonary Disease

## 2013-11-07 ENCOUNTER — Ambulatory Visit: Payer: 59 | Admitting: Internal Medicine

## 2013-11-07 LAB — FUNGUS CULTURE W SMEAR: Fungal Smear: NONE SEEN

## 2013-11-21 ENCOUNTER — Other Ambulatory Visit: Payer: Self-pay | Admitting: Cardiothoracic Surgery

## 2013-11-21 DIAGNOSIS — R590 Localized enlarged lymph nodes: Secondary | ICD-10-CM

## 2013-11-23 ENCOUNTER — Other Ambulatory Visit: Payer: Self-pay | Admitting: *Deleted

## 2013-11-23 ENCOUNTER — Ambulatory Visit (INDEPENDENT_AMBULATORY_CARE_PROVIDER_SITE_OTHER): Payer: 59 | Admitting: Cardiothoracic Surgery

## 2013-11-23 ENCOUNTER — Encounter (HOSPITAL_COMMUNITY): Payer: Self-pay | Admitting: Pharmacy Technician

## 2013-11-23 ENCOUNTER — Ambulatory Visit
Admission: RE | Admit: 2013-11-23 | Discharge: 2013-11-23 | Disposition: A | Discharge status: Home / Self Care | Payer: 59 | Source: Ambulatory Visit | Attending: Cardiothoracic Surgery | Admitting: Cardiothoracic Surgery

## 2013-11-23 ENCOUNTER — Other Ambulatory Visit: Payer: Self-pay

## 2013-11-23 ENCOUNTER — Encounter: Payer: Self-pay | Admitting: Cardiothoracic Surgery

## 2013-11-23 VITALS — BP 141/92 | HR 93 | Ht 71.0 in | Wt 244.0 lb

## 2013-11-23 DIAGNOSIS — T17808A Unspecified foreign body in other parts of respiratory tract causing other injury, initial encounter: Secondary | ICD-10-CM

## 2013-11-23 DIAGNOSIS — R590 Localized enlarged lymph nodes: Secondary | ICD-10-CM

## 2013-11-23 DIAGNOSIS — R05 Cough: Secondary | ICD-10-CM

## 2013-11-23 DIAGNOSIS — R053 Chronic cough: Secondary | ICD-10-CM

## 2013-11-23 DIAGNOSIS — R918 Other nonspecific abnormal finding of lung field: Secondary | ICD-10-CM

## 2013-11-23 LAB — AFB CULTURE WITH SMEAR (NOT AT ARMC): Acid Fast Smear: NONE SEEN

## 2013-11-23 LAB — CREATININE, SERUM: Creat: 1 mg/dL (ref 0.50–1.35)

## 2013-11-23 LAB — BUN: BUN: 10 mg/dL (ref 6–23)

## 2013-11-23 NOTE — Progress Notes (Signed)
[address]       [place] [id number]             [phone]      Michaelyn BarterJohn B Risden [facility] Medical Record #[id number] Date of Birth: 1955/06/16  Referring: Oretha MilchAlva, Rakesh V, MD Primary Care: Lupita RaiderSHAW,KIMBERLEE, MD  Chief Complaint:   POST OP FOLLOW UP DATE OF PROCEDURE: 10/13/2013  OPERATIVE REPORT  PREOPERATIVE DIAGNOSIS: Recurrent right lower lobe pneumonia, question  of endobronchial mass.  POSTOPERATIVE DIAGNOSIS: Foreign material, vegetable matter, right  lower lobe.  PROCEDURE PERFORMED: Video bronchoscopy with removal of foreign  material and debridement of granulation tissue with fluoroscopic  guidance.  History of Present Illness:     Patient returns for routine postoperative follow-up having undergone Video Bronchoscopy with removal of foreign body material revealed to be vegetable matter and biopsies that did not show evidence of malignancy on 10/13/2013. Since his bronchoscopy procedure the patient states  he is able to breath much better. He still has some cough more than he would like but it is much improved.  He has some phlegm production but for the most part this is clear. He denies fevers, chill, sweats.      Past Medical History  Diagnosis Date  . Substance abuse   . Mental disorder     ADD, bipolar  . Depression   . Shortness of breath   . Anxiety     Panic  . Bipolar disorder   . ADD (attention deficit disorder)   . GERD (gastroesophageal reflux disease)     ? they thought GERD caused cough.   . Pneumonia 04/12/2013     History  Smoking status  . Former Smoker -- 1.00 packs/day for 20 years  . Types: Cigarettes  . Quit date: 02/16/1993  Smokeless tobacco  . Never Used    History  Alcohol Use No    Comment: none at all     No Known Allergies  Current Outpatient Prescriptions  Medication Sig Dispense Refill  . atomoxetine (STRATTERA) 80 MG capsule Take 80 mg by mouth daily.      . budesonide-formoterol (SYMBICORT)  160-4.5 MCG/ACT inhaler Inhale 2 puffs into the lungs 2 (two) times daily.  1 Inhaler  0  . famotidine (PEPCID) 40 MG tablet Take 40 mg by mouth at bedtime.      Marland Kitchen. ibuprofen (ADVIL,MOTRIN) 200 MG tablet Take 600 mg by mouth every 6 (six) hours as needed.      . lamoTRIgine (LAMICTAL) 150 MG tablet Take 150 mg by mouth at bedtime. Mood /depression Take with 25 mg tablet to equal 175 mg dose      . lamoTRIgine (LAMICTAL) 25 MG tablet Take 150 mg by mouth at bedtime. Take with 150 mg tablet to equal 175 mg dose.      . pantoprazole (PROTONIX) 40 MG tablet Take 1 tablet (40 mg total) by mouth daily.  30 tablet  0  . traZODone (DESYREL) 100 MG tablet Take 50 mg by mouth at bedtime.       . Vilazodone HCl (VIIBRYD) 40 MG TABS Take 40 mg by mouth daily.      . chlorpheniramine (CHLOR-TRIMETON) 4 MG tablet Take 4 mg by mouth every 4 (four) hours as needed for allergies.      Maxwell Caul. Omeprazole-Sodium Bicarbonate (ZEGERID OTC PO) Take 1 tablet by mouth daily.        No current facility-administered medications for this visit.  Physical Exam: BP 141/92  Pulse 93  Ht 5\' 11"  (1.803 m)  Wt 244 lb (110.678 kg)  BMI 34.05 kg/m2  SpO2 96%  General appearance: alert and cooperative Neurologic: intact Heart: regular rate and rhythm, S1, S2 normal, no murmur, click, rub or gallop Lungs: clear to auscultation bilaterally Abdomen: soft, non-tender; bowel sounds normal; no masses,  no organomegaly Extremities: extremities normal, atraumatic, no cyanosis or edema and Homans sign is negative, no sign of DVT   Diagnostic Studies & Laboratory data:     Recent Radiology Findings:   Dg Chest 2 View  11/23/2013   CLINICAL DATA:  History of bronchoscopy in August for a suspected endobronchial foreign bodies ; persistent cough and congestion  EXAM: CHEST  2 VIEW  COMPARISON:  PA and lateral chest of October 30, 2013 and portable chest x-ray of October 13, 2013.  FINDINGS: The lungs are adequately inflated.  There is persistent parenchymal consolidation in the anterior lateral aspect of the right lower lobe. The left lung is clear. The heart and pulmonary vascularity are normal. There is no pleural effusion or pneumothorax. The bony thorax is unremarkable.  IMPRESSION: There is persistent abnormal parenchymal consolidation anterior laterally in the right lower lobe.   Electronically Signed   By: David  [place]   On: 11/23/2013 09:17      Recent Lab Findings: Lab Results  Component Value Date   WBC 14.1* 10/13/2013   HGB 13.6 10/13/2013   HCT 39.0 10/13/2013   PLT 226 10/13/2013   GLUCOSE 111* 10/13/2013   ALT 15 10/13/2013   AST 15 10/13/2013   NA 129* 10/13/2013   K 4.3 10/13/2013   CL 93* 10/13/2013   CREATININE 0.89 10/13/2013   BUN 10 10/13/2013   CO2 23 10/13/2013   INR 1.02 10/13/2013      Assessment / Plan:    I recommended to the patient that we proceed with repeat bronchoscopy, and prior to this a CT scan. The patient's symptoms have improved but he still has some opacity on chest x-ray and some cough. At original procedure there was significant edema the made it difficult to be sure that all the foreign material was removed. I've explained this to the patient and he is willing to proceed with bronchoscopy under general anesthesia so we were prepared if there is any material still present but it can be removed at the same setting.         Delight Ovens MD      [address] [place] [place] [address] Office [phone]   Beeper [phone]  11/23/2013 9:55 AM

## 2013-11-24 ENCOUNTER — Ambulatory Visit (HOSPITAL_COMMUNITY)
Admission: RE | Admit: 2013-11-24 | Discharge: 2013-11-24 | Disposition: A | Discharge status: Home / Self Care | Payer: 59 | Source: Ambulatory Visit | Attending: Cardiothoracic Surgery | Admitting: Cardiothoracic Surgery

## 2013-11-24 ENCOUNTER — Encounter (HOSPITAL_COMMUNITY)
Admission: RE | Admit: 2013-11-24 | Discharge: 2013-11-24 | Disposition: A | Discharge status: Home / Self Care | Payer: 59 | Source: Ambulatory Visit | Attending: Cardiothoracic Surgery | Admitting: Cardiothoracic Surgery

## 2013-11-24 ENCOUNTER — Encounter (HOSPITAL_COMMUNITY): Payer: Self-pay

## 2013-11-24 ENCOUNTER — Ambulatory Visit
Admission: RE | Admit: 2013-11-24 | Discharge: 2013-11-24 | Disposition: A | Discharge status: Home / Self Care | Payer: 59 | Source: Ambulatory Visit | Attending: Cardiothoracic Surgery | Admitting: Cardiothoracic Surgery

## 2013-11-24 VITALS — BP 127/75 | HR 62 | Temp 97.9°F | Resp 18 | Ht 70.0 in | Wt 237.2 lb

## 2013-11-24 DIAGNOSIS — T17808A Unspecified foreign body in other parts of respiratory tract causing other injury, initial encounter: Secondary | ICD-10-CM

## 2013-11-24 DIAGNOSIS — J181 Lobar pneumonia, unspecified organism: Secondary | ICD-10-CM | POA: Insufficient documentation

## 2013-11-24 DIAGNOSIS — R918 Other nonspecific abnormal finding of lung field: Secondary | ICD-10-CM

## 2013-11-24 LAB — COMPREHENSIVE METABOLIC PANEL
ALT: 19 U/L (ref 0–53)
AST: 20 U/L (ref 0–37)
Albumin: 3.8 g/dL (ref 3.5–5.2)
Alkaline Phosphatase: 105 U/L (ref 39–117)
Anion gap: 12 (ref 5–15)
BUN: 8 mg/dL (ref 6–23)
CO2: 27 mEq/L (ref 19–32)
Calcium: 9.3 mg/dL (ref 8.4–10.5)
Chloride: 98 mEq/L (ref 96–112)
Creatinine, Ser: 0.85 mg/dL (ref 0.50–1.35)
GFR calc Af Amer: >90 mL/min (ref >90)
GFR calc non Af Amer: >90 mL/min (ref >90)
Glucose, Bld: 95 mg/dL (ref 70–99)
Potassium: 4 mEq/L (ref 3.7–5.3)
Sodium: 137 mEq/L (ref 137–147)
Total Bilirubin: 0.9 mg/dL (ref 0.3–1.2)
Total Protein: 7.9 g/dL (ref 6.0–8.3)

## 2013-11-24 LAB — CBC
HCT: 35.8 % — ABNORMAL LOW (ref 39.0–52.0)
Hemoglobin: 12.5 g/dL — ABNORMAL LOW (ref 13.0–17.0)
MCH: 30.3 pg (ref 26.0–34.0)
MCHC: 34.9 g/dL (ref 30.0–36.0)
MCV: 86.7 fL (ref 78.0–100.0)
Platelets: 339 10*3/uL (ref 150–400)
RBC: 4.13 MIL/uL — ABNORMAL LOW (ref 4.22–5.81)
RDW: 12.1 % (ref 11.5–15.5)
WBC: 9 10*3/uL (ref 4.0–10.5)

## 2013-11-24 LAB — APTT: aPTT: 34 seconds (ref 24–37)

## 2013-11-24 LAB — PROTIME-INR
INR: 0.99 (ref 0.00–1.49)
Prothrombin Time: 13.1 seconds (ref 11.6–15.2)

## 2013-11-24 MED ORDER — IOHEXOL 300 MG/ML  SOLN
75.0000 mL | Freq: Once | INTRAMUSCULAR | Status: AC | PRN
  Administered 2013-11-24: 75 mL via INTRAVENOUS

## 2013-11-24 NOTE — Pre-Procedure Instructions (Signed)
Zachary BarterJohn B [name]  11/24/2013   Your procedure is scheduled on:  Tuesday  11/28/13  [facility] 530 AM.  Call this number if you have problems the morning of surgery: [phone]   Remember:   Do not eat food or drink liquids after midnight.   Take these medicines the morning of surgery with A SIP OF WATER:  OMEPRAZOLE(PRILOSEC), PANTOPRAZOLE (PROTONIX)                                             STOP IBUPROFEN, ADVIL,  MOTRIN  NOW    Do not wear jewelry, make-up or nail polish.  Do not wear lotions, powders, or perfumes. You may wear deodorant.  Do not shave 48 hours prior to surgery. Men may shave face and neck.  Do not bring valuables to the hospital.  [facility] is not responsible                  for any belongings or valuables.               Contacts, dentures or bridgework may not be worn into surgery.  Leave suitcase in the car. After surgery it may be brought to your room.  For patients admitted to the hospital, discharge time is determined by your                treatment team.               Patients discharged the day of surgery will not be allowed to drive  home.  Name and phone number of your driver:   Special Instructions:  Special Instructions: [facility] - Preparing for Surgery  Before surgery, you can play an important role.  Because skin is not sterile, your skin needs to be as free of germs as possible.  You can reduce the number of germs on you skin by washing with CHG (chlorahexidine gluconate) soap before surgery.  CHG is an antiseptic cleaner which kills germs and bonds with the skin to continue killing germs even after washing.  Please DO NOT use if you have an allergy to CHG or antibacterial soaps.  If your skin becomes reddened/irritated stop using the CHG and inform your nurse when you arrive at Short Stay.  Do not shave (including legs and underarms) for at least 48 hours prior to the first CHG shower.  You may shave your  face.  Please follow these instructions carefully:   1.  Shower with CHG Soap the night before surgery and the morning of Surgery.  2.  If you choose to wash your hair, wash your hair first as usual with your normal shampoo.  3.  After you shampoo, rinse your hair and body thoroughly to remove the Shampoo.  4.  Use CHG as you would any other liquid soap. You can apply chg directly to the skin and wash gently with scrungie or a clean washcloth.  5.  Apply the CHG Soap to your body ONLY FROM THE NECK DOWN.  Do not use on open wounds or open sores.  Avoid contact with your eyes, ears, mouth and genitals (private parts).  Wash genitals (private parts with your normal soap.  6.  Wash thoroughly, paying special attention to the area where your surgery will be performed.  7.  Thoroughly rinse your  body with warm water from the neck down.  8.  DO NOT shower/wash with your normal soap after using and rinsing off the CHG Soap.  9.  Pat yourself dry with a clean towel.            10.  Wear clean pajamas.            11.  Place clean sheets on your bed the night of your first shower and do not sleep with pets.  Day of Surgery  Do not apply any lotions/deodorants the morning of surgery.  Please wear clean clothes to the hospital/surgery center.   Please read over the following fact sheets that you were given: Pain Booklet, Coughing and Deep Breathing and Surgical Site Infection Prevention

## 2013-11-28 ENCOUNTER — Encounter (HOSPITAL_COMMUNITY)
Admission: RE | Disposition: A | Discharge status: Home / Self Care | Payer: Self-pay | Source: Ambulatory Visit | Attending: Cardiothoracic Surgery

## 2013-11-28 ENCOUNTER — Encounter (HOSPITAL_COMMUNITY): Payer: Self-pay | Admitting: Surgery

## 2013-11-28 ENCOUNTER — Encounter (HOSPITAL_COMMUNITY): Payer: 59 | Admitting: Certified Registered"

## 2013-11-28 ENCOUNTER — Ambulatory Visit (HOSPITAL_COMMUNITY): Payer: 59 | Admitting: Certified Registered"

## 2013-11-28 ENCOUNTER — Ambulatory Visit (HOSPITAL_COMMUNITY)
Admission: RE | Admit: 2013-11-28 | Discharge: 2013-11-28 | Disposition: A | Discharge status: Home / Self Care | Payer: 59 | Source: Ambulatory Visit | Attending: Cardiothoracic Surgery | Admitting: Cardiothoracic Surgery

## 2013-11-28 DIAGNOSIS — T17828A Food in other parts of respiratory tract causing other injury, initial encounter: Secondary | ICD-10-CM | POA: Insufficient documentation

## 2013-11-28 DIAGNOSIS — T17808A Unspecified foreign body in other parts of respiratory tract causing other injury, initial encounter: Secondary | ICD-10-CM

## 2013-11-28 DIAGNOSIS — M6028 Foreign body granuloma of soft tissue, not elsewhere classified, other site: Secondary | ICD-10-CM | POA: Diagnosis not present

## 2013-11-28 DIAGNOSIS — F419 Anxiety disorder, unspecified: Secondary | ICD-10-CM | POA: Diagnosis not present

## 2013-11-28 DIAGNOSIS — F319 Bipolar disorder, unspecified: Secondary | ICD-10-CM | POA: Diagnosis not present

## 2013-11-28 DIAGNOSIS — T17908D Unspecified foreign body in respiratory tract, part unspecified causing other injury, subsequent encounter: Secondary | ICD-10-CM

## 2013-11-28 DIAGNOSIS — Z87891 Personal history of nicotine dependence: Secondary | ICD-10-CM | POA: Insufficient documentation

## 2013-11-28 DIAGNOSIS — F988 Other specified behavioral and emotional disorders with onset usually occurring in childhood and adolescence: Secondary | ICD-10-CM | POA: Diagnosis not present

## 2013-11-28 DIAGNOSIS — K219 Gastro-esophageal reflux disease without esophagitis: Secondary | ICD-10-CM | POA: Insufficient documentation

## 2013-11-28 HISTORY — PX: VIDEO BRONCHOSCOPY: SHX5072

## 2013-11-28 SURGERY — BRONCHOSCOPY, VIDEO-ASSISTED
Anesthesia: General | Site: Bronchus

## 2013-11-28 MED ORDER — MIDAZOLAM HCL 5 MG/5ML IJ SOLN
INTRAMUSCULAR | Status: DC | PRN
  Administered 2013-11-28: 2 mg via INTRAVENOUS

## 2013-11-28 MED ORDER — SUCCINYLCHOLINE CHLORIDE 20 MG/ML IJ SOLN
INTRAMUSCULAR | Status: DC | PRN
  Administered 2013-11-28: 60 mg via INTRAVENOUS

## 2013-11-28 MED ORDER — PROPOFOL 10 MG/ML IV BOLUS
INTRAVENOUS | Status: AC
  Filled 2013-11-28: qty 20

## 2013-11-28 MED ORDER — LACTATED RINGERS IV SOLN
INTRAVENOUS | Status: DC | PRN
  Administered 2013-11-28: 07:00:00 via INTRAVENOUS

## 2013-11-28 MED ORDER — ONDANSETRON HCL 4 MG/2ML IJ SOLN
INTRAMUSCULAR | Status: DC | PRN
  Administered 2013-11-28: 4 mg via INTRAVENOUS

## 2013-11-28 MED ORDER — ROCURONIUM BROMIDE 50 MG/5ML IV SOLN
INTRAVENOUS | Status: AC
  Filled 2013-11-28: qty 1

## 2013-11-28 MED ORDER — EPINEPHRINE HCL 1 MG/ML IJ SOLN
INTRAMUSCULAR | Status: AC
  Filled 2013-11-28: qty 1

## 2013-11-28 MED ORDER — NEOSTIGMINE METHYLSULFATE 10 MG/10ML IV SOLN
INTRAVENOUS | Status: DC | PRN
  Administered 2013-11-28: 4 mg via INTRAVENOUS

## 2013-11-28 MED ORDER — LIDOCAINE HCL (CARDIAC) 20 MG/ML IV SOLN
INTRAVENOUS | Status: DC | PRN
  Administered 2013-11-28: 60 mg via INTRAVENOUS

## 2013-11-28 MED ORDER — ROCURONIUM BROMIDE 100 MG/10ML IV SOLN
INTRAVENOUS | Status: DC | PRN
  Administered 2013-11-28: 10 mg via INTRAVENOUS
  Administered 2013-11-28 (×2): 5 mg via INTRAVENOUS
  Administered 2013-11-28: 30 mg via INTRAVENOUS

## 2013-11-28 MED ORDER — FENTANYL CITRATE 0.05 MG/ML IJ SOLN
INTRAMUSCULAR | Status: AC
  Filled 2013-11-28: qty 5

## 2013-11-28 MED ORDER — PHENYLEPHRINE HCL 10 MG/ML IJ SOLN
10.0000 mg | INTRAVENOUS | Status: DC | PRN
  Administered 2013-11-28: 25 ug/min via INTRAVENOUS

## 2013-11-28 MED ORDER — FENTANYL CITRATE 0.05 MG/ML IJ SOLN
25.0000 ug | INTRAMUSCULAR | Status: DC | PRN
  Administered 2013-11-28: 50 ug via INTRAVENOUS

## 2013-11-28 MED ORDER — FENTANYL CITRATE 0.05 MG/ML IJ SOLN
INTRAMUSCULAR | Status: DC | PRN
  Administered 2013-11-28 (×3): 50 ug via INTRAVENOUS

## 2013-11-28 MED ORDER — 0.9 % SODIUM CHLORIDE (POUR BTL) OPTIME
TOPICAL | Status: DC | PRN
  Administered 2013-11-28 (×2): 1000 mL

## 2013-11-28 MED ORDER — GLYCOPYRROLATE 0.2 MG/ML IJ SOLN
INTRAMUSCULAR | Status: DC | PRN
  Administered 2013-11-28: 0.2 mg via INTRAVENOUS
  Administered 2013-11-28: 0.6 mg via INTRAVENOUS

## 2013-11-28 MED ORDER — CEFUROXIME SODIUM 1.5 G IJ SOLR
INTRAMUSCULAR | Status: AC
  Filled 2013-11-28: qty 1.5

## 2013-11-28 MED ORDER — DEXTROSE 5 % IV SOLN
50.0000 g | INTRAVENOUS | Status: DC | PRN
  Administered 2013-11-28: 1.5 g via INTRAVENOUS

## 2013-11-28 MED ORDER — MIDAZOLAM HCL 2 MG/2ML IJ SOLN
INTRAMUSCULAR | Status: AC
  Filled 2013-11-28: qty 2

## 2013-11-28 MED ORDER — SUCCINYLCHOLINE CHLORIDE 20 MG/ML IJ SOLN
INTRAMUSCULAR | Status: AC
  Filled 2013-11-28: qty 1

## 2013-11-28 MED ORDER — PROPOFOL 10 MG/ML IV BOLUS
INTRAVENOUS | Status: DC | PRN
  Administered 2013-11-28: 140 mg via INTRAVENOUS

## 2013-11-28 MED ORDER — FENTANYL CITRATE 0.05 MG/ML IJ SOLN
INTRAMUSCULAR | Status: AC
  Filled 2013-11-28: qty 2

## 2013-11-28 SURGICAL SUPPLY — 25 items
BASKET PULM ZERO TIP 12X120 (MISCELLANEOUS) ×2 IMPLANT
BRUSH CYTOL CELLEBRITY 1.5X140 (MISCELLANEOUS) IMPLANT
BSKT SPEC RTRVL ZERO TP 120X12 (MISCELLANEOUS) ×1
CANISTER SUCTION 2500CC (MISCELLANEOUS) ×2 IMPLANT
CONT SPEC 4OZ CLIKSEAL STRL BL (MISCELLANEOUS) ×4 IMPLANT
COVER TABLE BACK 60X90 (DRAPES) ×2 IMPLANT
FORCEPS BIOP RJ4 1.8 (CUTTING FORCEPS) ×2 IMPLANT
FORCEPS RADIAL JAW LRG 4 PULM (INSTRUMENTS) ×1 IMPLANT
GAUZE SPONGE 4X4 12PLY STRL (GAUZE/BANDAGES/DRESSINGS) ×2 IMPLANT
GLOVE BIO SURGEON STRL SZ 6.5 (GLOVE) ×4 IMPLANT
GLOVE SURG SS PI 7.0 STRL IVOR (GLOVE) ×2 IMPLANT
GOWN STRL REUS W/ TWL LRG LVL3 (GOWN DISPOSABLE) ×1 IMPLANT
GOWN STRL REUS W/ TWL XL LVL3 (GOWN DISPOSABLE) ×1 IMPLANT
GOWN STRL REUS W/TWL LRG LVL3 (GOWN DISPOSABLE) ×1
GOWN STRL REUS W/TWL XL LVL3 (GOWN DISPOSABLE) ×2
KIT ROOM TURNOVER OR (KITS) ×2 IMPLANT
MARKER SKIN DUAL TIP RULER LAB (MISCELLANEOUS) ×2 IMPLANT
NEEDLE BIOPSY TRANSBRONCH 21G (NEEDLE) IMPLANT
NS IRRIG 1000ML POUR BTL (IV SOLUTION) ×2 IMPLANT
OIL SILICONE PENTAX (PARTS (SERVICE/REPAIRS)) ×2 IMPLANT
RADIAL JAW LRG 4 PULMONARY (INSTRUMENTS) ×1
SYR 20ML ECCENTRIC (SYRINGE) ×2 IMPLANT
TOWEL OR 17X24 6PK STRL BLUE (TOWEL DISPOSABLE) ×2 IMPLANT
TRAP SPECIMEN MUCOUS 40CC (MISCELLANEOUS) ×2 IMPLANT
TUBE CONNECTING 12X1/4 (SUCTIONS) ×4 IMPLANT

## 2013-11-28 NOTE — Discharge Instructions (Signed)
Flexible Bronchoscopy, Care After °Refer to this sheet in the next few weeks. These instructions provide you with information on caring for yourself after your procedure. Your health care provider may also give you more specific instructions. Your treatment has been planned according to current medical practices, but problems sometimes occur. Call your health care provider if you have any problems or questions after your procedure.  °WHAT TO EXPECT AFTER THE PROCEDURE °It is normal to have the following symptoms for 24-48 hours after the procedure:  °· Increased cough. °· Low-grade fever. °· Sore throat or hoarse voice. °· Small streaks of blood in your thick spit (sputum) if tissue samples were taken (biopsy). °HOME CARE INSTRUCTIONS  °· Do not eat or drink anything for 2 hours after your procedure. Your nose and throat were numbed by medicine. If you try to eat or drink before the medicine wears off, food or drink could go into your lungs or you could burn yourself. After the numbness is gone and your cough and gag reflexes have returned, you may eat soft food and drink liquids slowly.   °· The day after the procedure, you can go back to your normal diet.   °· You may resume normal activities.   °· Keep all follow-up visits as directed by your health care provider. It is important to keep all your appointments, especially if tissue samples were taken for testing (biopsy). °SEEK IMMEDIATE MEDICAL CARE IF:  °· You have increasing shortness of breath.   °· You become light-headed or faint.   °· You have chest pain.   °· You have any new concerning symptoms. °· You cough up more than a small amount of blood. °· The amount of blood you cough up increases. °MAKE SURE YOU: °· Understand these instructions. °· Will watch your condition. °· Will get help right away if you are not doing well or get worse. °Document Released: 08/22/2004 Document Revised: 06/19/2013 Document Reviewed: 10/07/2012 °ExitCare® Patient Information  ©2015 [employer]. This information is not intended to replace advice given to you by your health care provider. Make sure you discuss any questions you have with your health care provider. ° ° °What to eat: ° °For your first meals, you should eat lightly; only small meals initially.  If you do not have nausea, you may eat larger meals.  Avoid spicy, greasy and heavy food.   ° °General Anesthesia, Adult, Care After  °Refer to this sheet in the next few weeks. These instructions provide you with information on caring for yourself after your procedure. Your health care provider may also give you more specific instructions. Your treatment has been planned according to current medical practices, but problems sometimes occur. Call your health care provider if you have any problems or questions after your procedure.  °WHAT TO EXPECT AFTER THE PROCEDURE  °After the procedure, it is typical to experience:  °Sleepiness.  °Nausea and vomiting. °HOME CARE INSTRUCTIONS  °For the first 24 hours after general anesthesia:  °Have a responsible person with you.  °Do not drive a car. If you are alone, do not take public transportation.  °Do not drink alcohol.  °Do not take medicine that has not been prescribed by your health care provider.  °Do not sign important papers or make important decisions.  °You may resume a normal diet and activities as directed by your health care provider.  °Change bandages (dressings) as directed.  °If you have questions or problems that seem related to general anesthesia, call the hospital and ask   for the anesthetist or anesthesiologist on call. °SEEK MEDICAL CARE IF:  °You have nausea and vomiting that continue the day after anesthesia.  °You develop a rash. °SEEK IMMEDIATE MEDICAL CARE IF:  °You have difficulty breathing.  °You have chest pain.  °You have any allergic problems. °Document Released: 05/11/2000 Document Revised: 10/05/2012 Document Reviewed: 08/18/2012  °ExitCare® Patient Information  ©2014 [employer].  ° ° °

## 2013-11-28 NOTE — Anesthesia Preprocedure Evaluation (Addendum)
Anesthesia Evaluation  Patient identified by MRN, date of birth, ID band Patient awake    Reviewed: Allergy & Precautions, H&P , NPO status , Patient's Chart, lab work & pertinent test results  Airway Mallampati: I TM Distance: >3 FB     Dental  (+) Teeth Intact, Dental Advisory Given   Pulmonary shortness of breath, pneumonia -, resolved, former smoker,  Right lower lobe foreign body with cough breath sounds clear to auscultation        Cardiovascular negative cardio ROS  Rhythm:Regular     Neuro/Psych Anxiety Depression Bipolar Disorder negative neurological ROS     GI/Hepatic Neg liver ROS, GERD-  Medicated and Controlled,  Endo/Other  negative endocrine ROS  Renal/GU negative Renal ROS     Musculoskeletal negative musculoskeletal ROS (+)   Abdominal (+)  Abdomen: soft. Bowel sounds: normal.  Peds  Hematology negative hematology ROS (+)   Anesthesia Other Findings   Reproductive/Obstetrics negative OB ROS                          Anesthesia Physical Anesthesia Plan  ASA: II  Anesthesia Plan: General   Post-op Pain Management:    Induction: Intravenous  Airway Management Planned: Oral ETT  Additional Equipment: None  Intra-op Plan:   Post-operative Plan: Extubation in OR  Informed Consent: I have reviewed the patients History and Physical, chart, labs and discussed the procedure including the risks, benefits and alternatives for the proposed anesthesia with the patient or authorized representative who has indicated his/her understanding and acceptance.   Dental advisory given  Plan Discussed with: CRNA and Surgeon  Anesthesia Plan Comments:         Anesthesia Quick Evaluation

## 2013-11-28 NOTE — H&P (Signed)
[address]       [place] [id number]             [phone]      Zachary BarterJohn B [name] [facility] Medical Record #[id number] Date of Birth: May 20, 1955  Referring: No ref. provider found Primary Care: Zachary RaiderSHAW,KIMBERLEE, MD  Chief Complaint:   POST OP FOLLOW UP DATE OF PROCEDURE: 10/13/2013  OPERATIVE REPORT  PREOPERATIVE DIAGNOSIS: Recurrent right lower lobe pneumonia, question  of endobronchial mass.  POSTOPERATIVE DIAGNOSIS: Foreign material, vegetable matter, right  lower lobe.  PROCEDURE PERFORMED: Video bronchoscopy with removal of foreign  material and debridement of granulation tissue with fluoroscopic  guidance.  History of Present Illness:     Patient returns for routine postoperative follow-up having undergone Video Bronchoscopy with removal of foreign body material revealed to be vegetable matter and biopsies that did not show evidence of malignancy on 10/13/2013. Since his bronchoscopy procedure the patient states  he is able to breath much better. He still has some cough more than he would like but it is much improved.  He has some phlegm production but for the most part this is clear. He denies fevers, chill, sweats.      Past Medical History  Diagnosis Date  . Substance abuse   . Mental disorder     ADD, bipolar  . Depression   . Shortness of breath   . Anxiety     Panic  . Bipolar disorder   . ADD (attention deficit disorder)   . GERD (gastroesophageal reflux disease)     ? they thought GERD caused cough.   . Pneumonia 04/12/2013     History  Smoking status  . Former Smoker -- 1.00 packs/day for 20 years  . Types: Cigarettes  . Quit date: 02/16/1993  Smokeless tobacco  . Never Used    History  Alcohol Use No    Comment: none at all     No Known Allergies  No current facility-administered medications for this encounter.   Facility-Administered Medications Ordered in Other Encounters  Medication Dose Route Frequency Provider  Last Rate Last Dose  . lactated ringers infusion    Continuous PRN Ellin GoodieHolly M Weaver, CRNA           Physical Exam: BP 143/101  Pulse 84  Temp(Src) 97.7 F (36.5 C) (Oral)  Resp 18  Ht 5\' 10"  (1.778 m)  Wt 237 lb (107.502 kg)  BMI 34.01 kg/m2  SpO2 97%  General appearance: alert and cooperative Neurologic: intact Heart: regular rate and rhythm, S1, S2 normal, no murmur, click, rub or gallop Lungs: clear to auscultation bilaterally Abdomen: soft, non-tender; bowel sounds normal; no masses,  no organomegaly Extremities: extremities normal, atraumatic, no cyanosis or edema and Homans sign is negative, no sign of DVT   Diagnostic Studies & Laboratory data:     Recent Radiology Findings:  Dg Chest 2 View Within Previous 72 Hours.  Films Obtained On Friday Are Acceptable For Monday And Tuesday Cases  11/24/2013   CLINICAL DATA:  Preop, post bronchoscopy,  EXAM: CHEST  2 VIEW  COMPARISON:  11/24/2013 and 11/23/2013  FINDINGS: Cardiomediastinal silhouette is stable. Persistent mild right hilar prominence. Persistent right lower lobe anterolateral infrahilar region consolidation best seen on lateral view. Left lung is clear. No pulmonary edema.  IMPRESSION: Persistent mild right hilar prominence. Persistent right lower lobe anterolateral infrahilar region consolidation best seen on lateral view. Left lung is clear. No pulmonary edema.   Electronically  Signed   By: Zachary MeadLiviu  Pop M.D.   On: 11/24/2013 15:27   Dg Chest 2 View  11/23/2013   CLINICAL DATA:  History of bronchoscopy in August for a suspected endobronchial foreign bodies ; persistent cough and congestion  EXAM: CHEST  2 VIEW  COMPARISON:  PA and lateral chest of October 30, 2013 and portable chest x-ray of October 13, 2013.  FINDINGS: The lungs are adequately inflated. There is persistent parenchymal consolidation in the anterior lateral aspect of the right lower lobe. The left lung is clear. The heart and pulmonary vascularity are  normal. There is no pleural effusion or pneumothorax. The bony thorax is unremarkable.  IMPRESSION: There is persistent abnormal parenchymal consolidation anterior laterally in the right lower lobe.   Electronically Signed   By: Zachary  [name]   On: 11/23/2013 09:17   Dg Chest 2 View  10/30/2013   CLINICAL DATA:  Postop foreign body removal.  EXAM: CHEST  2 VIEW  COMPARISON:  10/13/2013.  FINDINGS: Lung base opacity has improved on the right and resolved on the left. No new lung opacities. No pneumothorax. No pulmonary edema. No pleural effusion.  Heart, mediastinum hila are unremarkable.  Bony thorax is intact.  IMPRESSION: 1. Improved right lung base opacity, with residual opacity likely atelectasis, and resolved left lung base opacity. No new lung abnormalities. No pneumothorax.   Electronically Signed   By: Zachary Portlandavid  Ormond M.D.   On: 10/30/2013 13:20   Ct Chest W Contrast  11/24/2013   CLINICAL DATA:  Followup right lower lobe endobronchial lesions seen on prior CT.  EXAM: CT CHEST WITH CONTRAST  TECHNIQUE: Multidetector CT imaging of the chest was performed during intravenous contrast administration.  CONTRAST:  75mL OMNIPAQUE IOHEXOL 300 MG/ML  SOLN  COMPARISON:  10/06/2013  FINDINGS: Mediastinum/Hilar Regions: Right infrahilar mass or lymphadenopathy is seen, which is increased in size since previous study. This currently measures 2.5 x 3.5 cm, and there is increased postobstructive atelectasis in the anterior aspect of the Right lower lobe. This is highly suspicious for bronchogenic carcinoma.  Subcarinal mediastinal lymphadenopathy has increased measuring 1.5 cm on image 31. Mild high right paratracheal lymphadenopathy is also increased, measuring 12 mm on image 68 compared to 9 mm previously.  Other Thoracic Lymphadenopathy:  None.  Lungs:  See above.  Pleura:  Tiny right pleural effusion noted  Vascular/Cardiac:  No acute findings identified.  Musculoskeletal:  No suspicious bone lesions identified.   Other:  Stable tiny left hepatic lobe cyst.  IMPRESSION: Increased size of 3 cm right infrahilar mass or lymphadenopathy, with postobstructive atelectasis in the anterior right lower lobe. This is highly suspicious for primary bronchogenic carcinoma. Consider bronchoscopy for tissue diagnosis, and PET-CT scan for preoperative staging.  Increased mild mediastinal lymphadenopathy in the subcarinal and high right paratracheal regions, highly suspicious for metastatic disease.  Tiny right pleural effusion.   Electronically Signed   By: Myles RosenthalJohn  Stahl M.D.   On: 11/24/2013 12:49     Recent Lab Findings: Lab Results  Component Value Date   WBC 9.0 11/24/2013   HGB 12.5* 11/24/2013   HCT 35.8* 11/24/2013   PLT 339 11/24/2013   GLUCOSE 95 11/24/2013   ALT 19 11/24/2013   AST 20 11/24/2013   NA 137 11/24/2013   K 4.0 11/24/2013   CL 98 11/24/2013   CREATININE 0.85 11/24/2013   BUN 8 11/24/2013   CO2 27 11/24/2013   INR 0.99 11/24/2013      Assessment / Plan:  I recommended to the patient that we proceed with repeat bronchoscopy. The patient's symptoms have improved but he still has some opacity on chest x-ray and some cough. At original procedure there was significant edema the made it difficult to be sure that all the foreign material was removed. I've explained this to the patient and he is willing to proceed with bronchoscopy under general anesthesia so we were prepared if there is any material still present but it can be removed at the same setting.    The goals risks and alternatives of the planned surgical procedure bronchoscopy have been discussed with the patient in detail. The risks of the procedure including death, infection, stroke, myocardial infarction, bleeding, blood transfusion have all been discussed specifically.  I have quoted [name] a 1% of perioperative mortality and a complication rate as high as 10 %. The patient's questions have been answered.[name] is willing  to proceed with  the planned procedure.     Delight Ovens MD      [address] [place] [address] Office [phone]   Beeper [phone]  11/28/2013 7:19 AM

## 2013-11-28 NOTE — Progress Notes (Signed)
Dr gerhardt at bedside, explained procedure to pt and findings and shared had spoken with wife/ reassured pt of cough and duration and will resolve/ pt acknowledged understanding / has been given fentanyl to assist in cough suppression and calm pt who is a bit antsy " cause I can't get up and move" / pulse ox on forehead 100% room air vs  Fingertip which often reads in 80's

## 2013-11-28 NOTE — Brief Op Note (Signed)
       [address]       [place] [id number]             [phone]      11/28/2013  9:13 AM  PATIENT:  Zachary BarterJohn B [name]  58 y.o. male  PRE-OPERATIVE DIAGNOSIS:  Foreign Body Right Lower Lobe  POST-OPERATIVE DIAGNOSIS:  Foreign Body Right Lower Lobe  PROCEDURE:  Procedure(s) with comments: VIDEO BRONCHOSCOPY (N/A) - bronchoscopy for removal of foreign body  SURGEON:  Surgeon(s) and Role:    * Delight OvensEdward B Cochise Dinneen, MD - Primary   ANESTHESIA:   general  EBL:  Total I/O In: 700 [I.V.:700] Out: -   BLOOD ADMINISTERED:none  DRAINS: none   LOCAL MEDICATIONS USED:  NONE  SPECIMEN:  Source of Specimen:  right lower lobe  DISPOSITION OF SPECIMEN:  PATHOLOGY  COUNTS:  YES   DICTATION: .Other Dictation: Dictation Number .  PLAN OF CARE: Discharge to home after PACU  PATIENT DISPOSITION:  PACU - hemodynamically stable.   Delay start of Pharmacological VTE agent (>24hrs) due to surgical blood loss or risk of bleeding: not applicable Findings:

## 2013-11-28 NOTE — Transfer of Care (Signed)
Immediate Anesthesia Transfer of Care Note  Patient: Zachary BarterJohn B Hara  Procedure(s) Performed: Procedure(s) with comments: VIDEO BRONCHOSCOPY (N/A) - bronchoscopy for removal of foreign body  Patient Location: PACU  Anesthesia Type:General  Level of Consciousness: awake, alert  and sedated  Airway & Oxygen Therapy: Patient connected to face mask oxygen  Post-op Assessment: Report given to PACU RN  Post vital signs: stable  Complications: No apparent anesthesia complications

## 2013-11-28 NOTE — Anesthesia Postprocedure Evaluation (Signed)
  Anesthesia Post-op Note  Patient: Zachary BarterJohn B Wechter  Procedure(s) Performed: Procedure(s) with comments: VIDEO BRONCHOSCOPY (N/A) - bronchoscopy for removal of foreign body  Patient Location: PACU  Anesthesia Type:General  Level of Consciousness: awake and alert   Airway and Oxygen Therapy: Patient Spontanous Breathing  Post-op Pain: none  Post-op Assessment: Post-op Vital signs reviewed, Patient's Cardiovascular Status Stable, Respiratory Function Stable, Patent Airway, No signs of Nausea or vomiting and Pain level controlled  Post-op Vital Signs: Reviewed and stable  Last Vitals:  Filed Vitals:   11/28/13 1004  BP: 105/55  Pulse: 96  Temp:   Resp: 25    Complications: No apparent anesthesia complications

## 2013-11-28 NOTE — Anesthesia Procedure Notes (Signed)
Procedure Name: Intubation Date/Time: 11/28/2013 7:29 AM Performed by: Ellin GoodieWEAVER, Kvon Mcilhenny M Pre-anesthesia Checklist: Patient identified, Emergency Drugs available, Suction available, Patient being monitored and Timeout performed Patient Re-evaluated:Patient Re-evaluated prior to inductionOxygen Delivery Method: Circle system utilized Preoxygenation: Pre-oxygenation with 100% oxygen Intubation Type: IV induction Ventilation: Mask ventilation without difficulty Laryngoscope Size: Mac and 3 Grade View: Grade II Tube type: Oral Tube size: 9.0 mm Number of attempts: 1 Airway Equipment and Method: Stylet Placement Confirmation: ETT inserted through vocal cords under direct vision,  positive ETCO2 and breath sounds checked- equal and bilateral Secured at: 22 cm Tube secured with: Tape Dental Injury: Teeth and Oropharynx as per pre-operative assessment

## 2013-11-29 ENCOUNTER — Encounter (HOSPITAL_COMMUNITY): Payer: Self-pay | Admitting: Cardiothoracic Surgery

## 2013-11-29 NOTE — Op Note (Signed)
NAMFrancesco [name]:  [name], [name]                  ACCOUNT NO.:  [id number]  MEDICAL RECORD NO.:  [id number]  LOCATION:  [facility]                         FACILITY:  [facility]  PHYSICIAN:  Zachary PlaneEdward Loyalty Brashier, MD    DATE OF BIRTH:  1955-06-30  DATE OF PROCEDURE:  11/28/2013 DATE OF DISCHARGE:  11/28/2013                              OPERATIVE REPORT   PREOPERATIVE DIAGNOSIS:  Foreign body, right lower lobe.  PREOPERATIVE DIAGNOSIS:  Foreign body, right lower lobe.  SURGICAL PROCEDURE:  Video bronchoscopy with removal of foreign material.  SURGEON:  Zachary PlaneEdward Melven Stockard, MD  BRIEF HISTORY:  The patient is a 58 year old male who has had almost a year long history of cough, infiltrative process in the right lower lobe intermittently.  Four weeks prior, Pulmonary Service performed a bronchoscopy and identified a foreign material in the right lower lobe. Several days after this, he was referred to Thoracic Surgery.  Under general anesthesia, repeated a bronchoscopy, and removed some foreign material that was characterized as vegetable matter.  At that time, the bronchus was significantly edematous from the previous bronchoscopy. Some material was removed, but we could not be sure that it was completely removed, so the plan was to wait several weeks and then repeat the bronchoscopy.  The patient comes in today for planned repeat bronchoscopy.  Risks and options were discussed with the patient in detail.  A CT scan prior to the repeat bronchoscopy was performed that showed enlargement of regional lymph node and some consolidation in the left lower lobe.  This was interpreted by Radiology as evidence of advanced lung cancer, however, this was not consistent with the patient's clinical findings.  DESCRIPTION OF PROCEDURE:  The patient underwent general endotracheal anesthesia without incident.  After appropriate time-out, a fiberoptic bronchoscope was passed first examining the left lower lobe, which appeared normal.   The scope was then advanced into the right upper lobe and right middle lobe without any lesions.  Again we looked in the right lower lobe.  In the anterior basilar segment of the right lower lobe, the edema had decreased and there was obvious foreign material, this was similar to the previous endoscopy though with less surrounding edema. Initially, we tried grabbing the material with biopsy forceps, some fragments of material were removed in this way, but not the complete area.  We then used a small basket and we were able to pass the basket in a closed position past the area in question.  This released white creamy material from the bronchus, which was probably retained secretions.  Several attempts at passing this basket and then opening and closing the basket, we were able to remove 2 large fragments of material, which were shaped and formed as a "plug on plug" in the bronchus.  These material were carefully removed from the endotracheal tube and removed submitting it to Pathology.  With the 2 mm scope, we then were able to look back down past this area that the material had been down and the bronchoscope would pass into the anterior basilar segment.  There was no further material obvious.  There was some granulation tissue and edema in the area, but after we were  convinced visually that we had removed all the material, we irrigated and cleared out all loose secretions from the tracheobronchial tree.  The patient was then extubated in the operating room and transferred to the recovery room having tolerated the procedure without obvious complications.  The material removed was photographed and placed in the patient's brief operative note and submitted to Pathology.     Zachary PlaneEdward Destani Wamser, MD     EG/MEDQ  D:  11/29/2013  T:  11/29/2013  Job:  [id number]

## 2013-11-30 LAB — CULTURE, RESPIRATORY W GRAM STAIN

## 2013-12-22 ENCOUNTER — Encounter: Payer: Self-pay | Admitting: Cardiothoracic Surgery

## 2013-12-25 ENCOUNTER — Other Ambulatory Visit: Payer: Self-pay | Admitting: Cardiothoracic Surgery

## 2013-12-25 DIAGNOSIS — R590 Localized enlarged lymph nodes: Secondary | ICD-10-CM

## 2013-12-28 ENCOUNTER — Encounter: Payer: Self-pay | Admitting: Cardiothoracic Surgery

## 2013-12-28 ENCOUNTER — Ambulatory Visit
Admission: RE | Admit: 2013-12-28 | Discharge: 2013-12-28 | Disposition: A | Discharge status: Home / Self Care | Payer: 59 | Source: Ambulatory Visit | Attending: Cardiothoracic Surgery | Admitting: Cardiothoracic Surgery

## 2013-12-28 ENCOUNTER — Ambulatory Visit (INDEPENDENT_AMBULATORY_CARE_PROVIDER_SITE_OTHER): Payer: 59 | Admitting: Cardiothoracic Surgery

## 2013-12-28 VITALS — BP 153/99 | HR 88 | Resp 16 | Ht 71.0 in | Wt 244.0 lb

## 2013-12-28 DIAGNOSIS — J181 Lobar pneumonia, unspecified organism: Secondary | ICD-10-CM

## 2013-12-28 DIAGNOSIS — T81509D Unspecified complication of foreign body accidentally left in body following unspecified procedure, subsequent encounter: Secondary | ICD-10-CM

## 2013-12-28 DIAGNOSIS — R590 Localized enlarged lymph nodes: Secondary | ICD-10-CM

## 2013-12-28 NOTE — Progress Notes (Signed)
[address]       [place] [id number]             [phone]      [facility] Medical Record #[id number] Date of Birth: 03-19-1955  Referring: Oretha MilchAlva, Rakesh V, MD Primary Care: Lupita RaiderSHAW,KIMBERLEE, MD  Chief Complaint:   POST OP FOLLOW UP DATE OF PROCEDURE: 10/13/2013  OPERATIVE REPORT  PREOPERATIVE DIAGNOSIS: Recurrent right lower lobe pneumonia, question  of endobronchial mass.  POSTOPERATIVE DIAGNOSIS: Foreign material, vegetable matter, right  lower lobe.  PROCEDURE PERFORMED: Video bronchoscopy with removal of foreign  material and debridement of granulation tissue with fluoroscopic  Guidance.  10/13/201 OPERATIVE REPORT  PREOPERATIVE DIAGNOSIS: Foreign body, right lower lobe. PREOPERATIVE DIAGNOSIS: Foreign body, right lower lobe. SURGICAL PROCEDURE: Video bronchoscopy with removal of foreign material. SURGEON: Sheliah PlaneEdward Abdikadir Fohl, MD   History of Present Illness:     Patient returns for routine postoperative follow-up having undergone Video Bronchoscopy with removal of foreign body material revealed to be vegetable matter and biopsies that did not show evidence of malignancy on 10/13/2013. Since his bronchoscopy procedure the patient states  he is able to breath much better. He still has some minorcough it is much improved since the second bronchoscopy.  He has some phlegm production but for the most part this is clear. He denies fevers, chill, sweats. Follow up chest xray done today      Past Medical History  Diagnosis Date  . Substance abuse   . Mental disorder     ADD, bipolar  . Depression   . Shortness of breath   . Anxiety     Panic  . Bipolar disorder   . ADD (attention deficit disorder)   . GERD (gastroesophageal reflux disease)     ? they thought GERD caused cough.   . Pneumonia 04/12/2013     History  Smoking status  . Former Smoker -- 1.00 packs/day for 20 years  . Types: Cigarettes  . Quit date:  02/16/1993  Smokeless tobacco  . Never Used    History  Alcohol Use No    Comment: none at all     No Known Allergies  Current Outpatient Prescriptions  Medication Sig Dispense Refill  . atomoxetine (STRATTERA) 80 MG capsule Take 80 mg by mouth every morning.     Marland Kitchen. ibuprofen (ADVIL,MOTRIN) 200 MG tablet Take 400-600 mg by mouth daily as needed for headache.     . lamoTRIgine (LAMICTAL) 150 MG tablet Take 150 mg by mouth at bedtime.     . Melatonin 5 MG LOZG Place 5 mg under the tongue at bedtime.    . pantoprazole (PROTONIX) 40 MG tablet Take 40 mg by mouth every morning.    . traZODone (DESYREL) 100 MG tablet Take 50 mg by mouth at bedtime.     . Vilazodone HCl (VIIBRYD) 40 MG TABS Take 40 mg by mouth every morning.      No current facility-administered medications for this visit.       Physical Exam: BP 153/99 mmHg  Pulse 88  Resp 16  Ht 5\' 11"  (1.803 m)  Wt 244 lb (110.678 kg)  BMI 34.05 kg/m2  SpO2 98%  General appearance: alert and cooperative Neurologic: intact Heart: regular rate and rhythm, S1, S2 normal, no murmur, click, rub or gallop Lungs: clear to auscultation bilaterally Abdomen: soft, non-tender; bowel sounds normal; no masses,  no organomegaly Extremities: extremities normal, atraumatic, no cyanosis or edema and  Homans sign is negative, no sign of DVT   Diagnostic Studies & Laboratory data:     Recent Radiology Findings:   Dg Chest 2 View  12/28/2013   CLINICAL DATA:  Shortness of breath. Bronchoscopy on 11/28/2013 anal which vegetable matter/foreign material was removed from the anterior basal segment bronchus in the right lower lobe. Mediastinal adenopathy.  EXAM: CHEST  2 VIEW  COMPARISON:  11/24/2013  FINDINGS: Continued airspace opacity in the anterior segment right lower lobe, best seen on the lateral projection, with some associated volume loss. The lungs appear otherwise clear. The mild subcarinal adenopathy shown on CT scan in the mild right  paratracheal adenopathy shown on CT scan are not visible but not expected to be well seen on today's chest radiographs.  Mild thoracic spondylosis. Cardiac and mediastinal margins appear normal. No pneumothorax.  IMPRESSION: 1. Persistent airspace opacity in the anterior basal segment right lower lobe, although the amount of involvement seems less than previous. Based on review of the medical records, foreign material/vegetable matter was removed from the anterior basal segment bronchus where it had been chronically obstructing. It could be that the continued airspace opacity is simply due to bronchial edema causing some degree of residual obstruction. Residual plugging by foreign material is not something I can rule out based on the chest radiographic appearance.   Electronically Signed   By: Herbie BaltimoreWalt  Liebkemann M.D.   On: 12/28/2013 12:37      Recent Lab Findings: Lab Results  Component Value Date   WBC 9.0 11/24/2013   HGB 12.5* 11/24/2013   HCT 35.8* 11/24/2013   PLT 339 11/24/2013   GLUCOSE 95 11/24/2013   ALT 19 11/24/2013   AST 20 11/24/2013   NA 137 11/24/2013   K 4.0 11/24/2013   CL 98 11/24/2013   CREATININE 0.85 11/24/2013   BUN 8 11/24/2013   CO2 27 11/24/2013   INR 0.99 11/24/2013      Assessment / Plan:   The patient's symptoms have improved. The chest x-ray appears to be clearing, with the findings at the second bronchoscopy improved symptoms and improve chest x-ray we will continue to observe the patient with a follow-up chest x-ray in approximately 8 weeks.      Delight OvensEdward B Nikesha Kwasny MD      [address] [place] [id number] Office [phone]   Beeper [phone]  12/28/2013 1:49 PM

## 2014-03-20 ENCOUNTER — Other Ambulatory Visit: Payer: Self-pay

## 2014-03-20 DIAGNOSIS — R918 Other nonspecific abnormal finding of lung field: Secondary | ICD-10-CM

## 2014-03-22 ENCOUNTER — Ambulatory Visit (INDEPENDENT_AMBULATORY_CARE_PROVIDER_SITE_OTHER): Payer: 59 | Admitting: Cardiothoracic Surgery

## 2014-03-22 ENCOUNTER — Ambulatory Visit
Admission: RE | Admit: 2014-03-22 | Discharge: 2014-03-22 | Disposition: A | Discharge status: Home / Self Care | Payer: 59 | Source: Ambulatory Visit | Attending: Cardiothoracic Surgery | Admitting: Cardiothoracic Surgery

## 2014-03-22 ENCOUNTER — Encounter: Payer: Self-pay | Admitting: Cardiothoracic Surgery

## 2014-03-22 ENCOUNTER — Other Ambulatory Visit: Payer: Self-pay | Admitting: *Deleted

## 2014-03-22 VITALS — BP 153/97 | HR 78 | Resp 16 | Ht 71.0 in | Wt 250.0 lb

## 2014-03-22 DIAGNOSIS — R918 Other nonspecific abnormal finding of lung field: Secondary | ICD-10-CM

## 2014-03-22 DIAGNOSIS — R590 Localized enlarged lymph nodes: Secondary | ICD-10-CM

## 2014-03-22 DIAGNOSIS — T81509D Unspecified complication of foreign body accidentally left in body following unspecified procedure, subsequent encounter: Secondary | ICD-10-CM

## 2014-03-22 DIAGNOSIS — J181 Lobar pneumonia, unspecified organism: Secondary | ICD-10-CM

## 2014-03-22 NOTE — Progress Notes (Signed)
[address]       [place] [address]             [phone]      [name] [facility] Medical Record #[id number] Date of Birth: 1955/04/14  Referring: Oretha Milch, MD Primary Care: [name], MD  Chief Complaint:   POST OP FOLLOW UP DATE OF PROCEDURE: 10/13/2013  OPERATIVE REPORT  PREOPERATIVE DIAGNOSIS: Recurrent right lower lobe pneumonia, question  of endobronchial mass.  POSTOPERATIVE DIAGNOSIS: Foreign material, vegetable matter, right  lower lobe.  PROCEDURE PERFORMED: Video bronchoscopy with removal of foreign  material and debridement of granulation tissue with fluoroscopic  Guidance.  10/13/201 OPERATIVE REPORT  PREOPERATIVE DIAGNOSIS: Foreign body, right lower lobe. PREOPERATIVE DIAGNOSIS: Foreign body, right lower lobe. SURGICAL PROCEDURE: Video bronchoscopy with removal of foreign material. SURGEON: Sheliah Plane, MD   History of Present Illness:     Patient returns for follow up   After  having undergone Video Bronchoscopy with removal of foreign body material revealed to be vegetable matter and biopsies that did not show evidence of malignancy on 10/13/2013. Overall his symptoms are much improved though he does note occasional dry hacking cough, nonproductive, no fever or chills, no hemoptysis.   Past Medical History  Diagnosis Date  . Substance abuse   . Mental disorder     ADD, bipolar  . Depression   . Shortness of breath   . Anxiety     Panic  . Bipolar disorder   . ADD (attention deficit disorder)   . GERD (gastroesophageal reflux disease)     ? they thought GERD caused cough.   . Pneumonia 04/12/2013     History  Smoking status  . Former Smoker -- 1.00 packs/day for 20 years  . Types: Cigarettes  . Quit date: 02/16/1993  Smokeless tobacco  . Never Used    History  Alcohol Use No    Comment: none at all     No Known Allergies  Current Outpatient Prescriptions  Medication Sig  Dispense Refill  . atomoxetine (STRATTERA) 80 MG capsule Take 80 mg by mouth every morning.     Marland Kitchen ibuprofen (ADVIL,MOTRIN) 200 MG tablet Take 400-600 mg by mouth daily as needed for headache.     . lamoTRIgine (LAMICTAL) 150 MG tablet Take 150 mg by mouth at bedtime.     . Melatonin 5 MG LOZG Place 5 mg under the tongue at bedtime.    . pantoprazole (PROTONIX) 40 MG tablet Take 40 mg by mouth every morning.    . traZODone (DESYREL) 100 MG tablet Take 50 mg by mouth at bedtime.     . Vilazodone HCl (VIIBRYD) 40 MG TABS Take 40 mg by mouth every morning.      No current facility-administered medications for this visit.       Physical Exam: BP 153/97 mmHg  Pulse 78  Resp 16  Ht  (1.803 m)  Wt 250 lb (113.399 kg)  BMI 34.88 kg/m2  SpO2 97%  General appearance: alert and cooperative Neurologic: intact Heart: regular rate and rhythm, S1, S2 normal, no murmur, click, rub or gallop Lungs: clear to auscultation bilaterally Abdomen: soft, non-tender; bowel sounds normal; no masses,  no organomegaly Extremities: extremities normal, atraumatic, no cyanosis or edema and Homans sign is negative, no sign of DVT New scar left elbow from recent orthopedic surgery  Diagnostic Studies & Laboratory data:     Recent Radiology Findings:  Dg Chest  2 View  03/22/2014   CLINICAL DATA:  Status post lung biopsy in November 2015 with benign pathology; persistent cough and sensation of chest fullness ; history of previous tobacco use  EXAM: CHEST  2 VIEW  COMPARISON:  PA and lateral chest x-ray of December 28, 2013  FINDINGS: The lungs are adequately inflated. There is stable haziness of the lateral aspect of the right hemidiaphragm. Density in the anterior basal segment of the right lower lobe has cleared. The heart and pulmonary vascularity are normal. The mediastinum is normal in width. There is no pleural effusion. The bony thorax is unremarkable.  IMPRESSION: Interval clearing of the right lower  lobe infiltrate. There is stable partial obscuration of the lateral aspect of the right hemidiaphragm.  If the patient's symptoms persist and remain unexplained, follow-up chest CT scanning is recommended.   Electronically Signed   By: David  SwazilandJordan   On: 03/22/2014 11:53     I have independently reviewed the above radiology studies  and reviewed the findings with the patient.     Recent Lab Findings: Lab Results  Component Value Date   WBC 9.0 11/24/2013   HGB 12.5* 11/24/2013   HCT 35.8* 11/24/2013   PLT 339 11/24/2013   GLUCOSE 95 11/24/2013   ALT 19 11/24/2013   AST 20 11/24/2013   NA 137 11/24/2013   K 4.0 11/24/2013   CL 98 11/24/2013   CREATININE 0.85 11/24/2013   BUN 8 11/24/2013   CO2 27 11/24/2013   INR 0.99 11/24/2013      Assessment / Plan:   The patient's symptoms have improved. The chest x-ray appears to be clearing I reviewed with the patient the x-ray findings and continued improvement of the infiltrate in the right lower lobe. We'll plan to see the patient back in 6-8 weeks with a follow-up CT scan to ensure there is no underlying lung mass hidden by the previous infiltrative processes.  Delight OvensEdward B Oney Tatlock MD      [address] [place] [id number] Office [phone]   Beeper [phone]  03/22/2014 12:36 PM

## 2014-05-15 ENCOUNTER — Encounter: Payer: Self-pay | Admitting: *Deleted

## 2014-05-17 ENCOUNTER — Other Ambulatory Visit: Payer: Self-pay | Admitting: Cardiothoracic Surgery

## 2014-05-17 ENCOUNTER — Ambulatory Visit (INDEPENDENT_AMBULATORY_CARE_PROVIDER_SITE_OTHER): Payer: 59 | Admitting: Cardiothoracic Surgery

## 2014-05-17 ENCOUNTER — Ambulatory Visit
Admission: RE | Admit: 2014-05-17 | Discharge: 2014-05-17 | Disposition: A | Discharge status: Home / Self Care | Payer: 59 | Source: Ambulatory Visit | Attending: Cardiothoracic Surgery | Admitting: Cardiothoracic Surgery

## 2014-05-17 ENCOUNTER — Encounter: Payer: Self-pay | Admitting: Cardiothoracic Surgery

## 2014-05-17 VITALS — BP 155/100 | HR 79 | Resp 20 | Ht 71.0 in

## 2014-05-17 DIAGNOSIS — R918 Other nonspecific abnormal finding of lung field: Secondary | ICD-10-CM

## 2014-05-17 DIAGNOSIS — J181 Lobar pneumonia, unspecified organism: Secondary | ICD-10-CM

## 2014-05-17 DIAGNOSIS — R911 Solitary pulmonary nodule: Secondary | ICD-10-CM

## 2014-05-17 DIAGNOSIS — R590 Localized enlarged lymph nodes: Secondary | ICD-10-CM

## 2014-05-17 NOTE — Progress Notes (Signed)
[address]       [place] [address]             [phone]      OCEAN SCHILDT [facility] Medical Record #[id number] Date of Birth: 12/23/1955  Referring: Oretha Milch, MD Primary Care: [name], MD  Chief Complaint:   POST OP FOLLOW UP DATE OF PROCEDURE: 10/13/2013  OPERATIVE REPORT  PREOPERATIVE DIAGNOSIS: Recurrent right lower lobe pneumonia, question  of endobronchial mass.  POSTOPERATIVE DIAGNOSIS: Foreign material, vegetable matter, right  lower lobe.  PROCEDURE PERFORMED: Video bronchoscopy with removal of foreign  material and debridement of granulation tissue with fluoroscopic  Guidance.  10/13/201 OPERATIVE REPORT  PREOPERATIVE DIAGNOSIS: Foreign body, right lower lobe. PREOPERATIVE DIAGNOSIS: Foreign body, right lower lobe. SURGICAL PROCEDURE: Video bronchoscopy with removal of foreign material. SURGEON: Sheliah Plane, MD   History of Present Illness:     Patient returns for follow up   After  having undergone Video Bronchoscopy with removal of foreign body material revealed to be vegetable matter and biopsies that did not show evidence of malignancy on 10/13/2013. Overall his symptoms are much improved though he does note occasional dry hacking cough, nonproductive, no fever or chills, no hemoptysis. He returns today for a follow-up CT scan of the chest to ensure there are no additional findings that can be consistent with a missed indolent carcinoma of the lung.   Past Medical History  Diagnosis Date  . Substance abuse   . Mental disorder     ADD, bipolar  . Depression   . Shortness of breath   . Anxiety     Panic  . Bipolar disorder   . ADD (attention deficit disorder)   . GERD (gastroesophageal reflux disease)     ? they thought GERD caused cough.   . Pneumonia 04/12/2013     History  Smoking status  . Former Smoker -- 1.00 packs/day for 20 years  . Types: Cigarettes  . Quit date: 02/16/1993  Smokeless  tobacco  . Never Used    History  Alcohol Use No    Comment: none at all     No Known Allergies  Current Outpatient Prescriptions  Medication Sig Dispense Refill  . ARIPiprazole (ABILIFY) 5 MG tablet Take 5 mg by mouth once.     Marland Kitchen ibuprofen (ADVIL,MOTRIN) 200 MG tablet Take 400-600 mg by mouth daily as needed for headache.     . lamoTRIgine (LAMICTAL) 200 MG tablet     . Melatonin 5 MG LOZG Place 5 mg under the tongue at bedtime.    . pantoprazole (PROTONIX) 40 MG tablet Take 40 mg by mouth every morning.    Marland Kitchen STRATTERA 100 MG capsule Take 100 mg by mouth daily.     . traZODone (DESYREL) 100 MG tablet Take 50 mg by mouth at bedtime.     . Vilazodone HCl (VIIBRYD) 40 MG TABS Take 40 mg by mouth every morning.      No current facility-administered medications for this visit.       Physical Exam: BP 155/100 mmHg  Pulse 79  Resp 20  Ht  (1.803 m)  SpO2 96%  General appearance: alert and cooperative Neurologic: intact Heart: regular rate and rhythm, S1, S2 normal, no murmur, click, rub or gallop Lungs: clear to auscultation bilaterally Abdomen: soft, non-tender; bowel sounds normal; no masses,  no organomegaly Extremities: extremities normal, atraumatic, no cyanosis or edema and Homans sign is negative, no  sign of DVT New scar left elbow from recent orthopedic surgery  Diagnostic Studies & Laboratory data:     Recent Radiology Findings:  Ct Chest Wo Contrast  05/17/2014   CLINICAL DATA:  Right lower lung mass. History of " Vegetable material") removed from right lower. Ex-smoker.  EXAM: CT CHEST WITHOUT CONTRAST  TECHNIQUE: Multidetector CT imaging of the chest was performed following the standard protocol without IV contrast.  COMPARISON:  CT 11/24/2013.  FINDINGS: Mediastinum/Nodes: No axillary supraclavicular lymphadenopathy. No mediastinal or hilar hilar lymphadenopathy. No pericardial fluid.  Lungs/Pleura: There is a band of the linear thickening with central  bronchiectasis in the right lower lobe decreased considerably comparison CT exam. Linear thickening measures 7.2 x 1.6 cm compared to 8.4 x 3.6 cm.  Small subpleural nodule in the left lower lobe measures 6 mm (image 43, series 4 )which is not changed from prior.  Upper abdomen: Limited view of the liver, kidneys, pancreas are unremarkable. Normal adrenal glands.  Musculoskeletal: No aggressive osseous lesion.  IMPRESSION: 1. Significant decrease in volume of band of atelectasis with the right lower lobe. Central bronchiectasis now within the atelectasis. No suspicious nodularity associated with this lesion. 2. Stable small subpleural nodule in the left lower lobe.   Electronically Signed   By: Genevive BiStewart  Edmunds M.D.   On: 05/17/2014 11:55     I have independently reviewed the above radiology studies  and reviewed the findings with the patient.     Recent Lab Findings: Lab Results  Component Value Date   WBC 9.0 11/24/2013   HGB 12.5* 11/24/2013   HCT 35.8* 11/24/2013   PLT 339 11/24/2013   GLUCOSE 95 11/24/2013   ALT 19 11/24/2013   AST 20 11/24/2013   NA 137 11/24/2013   K 4.0 11/24/2013   CL 98 11/24/2013   CREATININE 0.85 11/24/2013   BUN 8 11/24/2013   CO2 27 11/24/2013   INR 0.99 11/24/2013      Assessment / Plan:   The patient's symptoms have improved, but he does have occasional dry hacking cough . Significant decrease in volume of band of atelectasis with the right lower lobe Central bronchiectasis now within the atelectasis. No suspicious nodularity associated with this lesion by CT scan Will refer back to pulmonology for continued follow-up, patient requested not to see Dr. Vassie LollAlva. I'll plan to see back in 6 months with a follow-up chest x-ray  Delight OvensEdward B Lotus Gover MD      [address] [place] [id number] Office [phone]   Beeper [phone]  05/17/2014 12:52 PM

## 2014-05-18 ENCOUNTER — Telehealth: Payer: Self-pay | Admitting: Pulmonary Disease

## 2014-05-18 NOTE — Telephone Encounter (Signed)
Spoke with the pt  He is okay with waiting until 07/04/14  Appt sched for 07/04/14 at 10:30 am

## 2014-05-18 NOTE — Telephone Encounter (Signed)
I am okay with assuming his care if he can wait until 07/04/14 - otherwise see if Dr. Delton CoombesByrum will see him sooner.

## 2014-05-18 NOTE — Telephone Encounter (Signed)
RB's next available appt is 06/26/2014 and VS's next available appt is 07/04/14.    Dr. Craige CottaSood and Dr. Delton CoombesByrum are either of you ok with taking over this patient per Dr. Lowella FairyGerhart?  Thanks!

## 2014-06-13 ENCOUNTER — Other Ambulatory Visit: Payer: Self-pay | Admitting: Physician Assistant

## 2014-06-13 ENCOUNTER — Ambulatory Visit
Admission: RE | Admit: 2014-06-13 | Discharge: 2014-06-13 | Disposition: A | Discharge status: Home / Self Care | Payer: 59 | Source: Ambulatory Visit | Attending: Physician Assistant | Admitting: Physician Assistant

## 2014-06-13 DIAGNOSIS — R05 Cough: Secondary | ICD-10-CM

## 2014-06-13 DIAGNOSIS — R059 Cough, unspecified: Secondary | ICD-10-CM

## 2014-07-04 ENCOUNTER — Institutional Professional Consult (permissible substitution): Payer: 59 | Admitting: Pulmonary Disease

## 2014-07-12 ENCOUNTER — Ambulatory Visit
Admission: RE | Admit: 2014-07-12 | Discharge: 2014-07-12 | Disposition: A | Discharge status: Home / Self Care | Payer: 59 | Source: Ambulatory Visit | Attending: Family Medicine | Admitting: Family Medicine

## 2014-07-12 ENCOUNTER — Other Ambulatory Visit: Payer: Self-pay | Admitting: Family Medicine

## 2014-07-12 DIAGNOSIS — M25519 Pain in unspecified shoulder: Secondary | ICD-10-CM

## 2014-08-23 ENCOUNTER — Institutional Professional Consult (permissible substitution): Payer: 59 | Admitting: Pulmonary Disease

## 2014-11-15 ENCOUNTER — Ambulatory Visit: Payer: 59 | Admitting: Cardiothoracic Surgery

## 2014-11-22 ENCOUNTER — Ambulatory Visit
Admission: RE | Admit: 2014-11-22 | Discharge: 2014-11-22 | Disposition: A | Discharge status: Home / Self Care | Payer: BLUE CROSS/BLUE SHIELD | Source: Ambulatory Visit | Attending: Family Medicine | Admitting: Family Medicine

## 2014-11-22 ENCOUNTER — Other Ambulatory Visit: Payer: Self-pay | Admitting: Family Medicine

## 2014-11-22 DIAGNOSIS — J189 Pneumonia, unspecified organism: Secondary | ICD-10-CM

## 2014-11-22 DIAGNOSIS — R05 Cough: Secondary | ICD-10-CM

## 2014-11-22 DIAGNOSIS — R059 Cough, unspecified: Secondary | ICD-10-CM

## 2014-12-19 ENCOUNTER — Other Ambulatory Visit: Payer: Self-pay | Admitting: Cardiothoracic Surgery

## 2014-12-19 DIAGNOSIS — R590 Localized enlarged lymph nodes: Secondary | ICD-10-CM

## 2014-12-20 ENCOUNTER — Ambulatory Visit: Payer: BLUE CROSS/BLUE SHIELD | Admitting: Cardiothoracic Surgery

## 2014-12-20 ENCOUNTER — Ambulatory Visit: Payer: 59 | Admitting: Cardiothoracic Surgery

## 2014-12-20 ENCOUNTER — Ambulatory Visit
Admission: RE | Admit: 2014-12-20 | Discharge: 2014-12-20 | Disposition: A | Discharge status: Home / Self Care | Payer: BLUE CROSS/BLUE SHIELD | Source: Ambulatory Visit | Attending: Cardiothoracic Surgery | Admitting: Cardiothoracic Surgery

## 2014-12-20 ENCOUNTER — Other Ambulatory Visit: Payer: Self-pay | Admitting: *Deleted

## 2014-12-20 ENCOUNTER — Encounter: Payer: Self-pay | Admitting: Cardiothoracic Surgery

## 2014-12-20 ENCOUNTER — Ambulatory Visit (INDEPENDENT_AMBULATORY_CARE_PROVIDER_SITE_OTHER): Payer: BLUE CROSS/BLUE SHIELD | Admitting: Cardiothoracic Surgery

## 2014-12-20 VITALS — BP 144/89 | HR 67 | Resp 16 | Ht 71.0 in | Wt 242.0 lb

## 2014-12-20 DIAGNOSIS — R918 Other nonspecific abnormal finding of lung field: Secondary | ICD-10-CM

## 2014-12-20 DIAGNOSIS — T81509D Unspecified complication of foreign body accidentally left in body following unspecified procedure, subsequent encounter: Secondary | ICD-10-CM | POA: Diagnosis not present

## 2014-12-20 DIAGNOSIS — R590 Localized enlarged lymph nodes: Secondary | ICD-10-CM

## 2014-12-20 DIAGNOSIS — R05 Cough: Secondary | ICD-10-CM

## 2014-12-20 DIAGNOSIS — R053 Chronic cough: Secondary | ICD-10-CM

## 2014-12-20 NOTE — Progress Notes (Signed)
[address]       [place] [id number]             [phone]      Michaelyn BarterJohn B Kram [facility] #[id number] Date of Birth: 09-16-1955  Referring: Oretha MilchAlva, Rakesh V, MD Primary Care: Lupita RaiderSHAW,KIMBERLEE, MD  Chief Complaint:   POST OP FOLLOW UP DATE OF PROCEDURE: 10/13/2013  OPERATIVE REPORT  PREOPERATIVE DIAGNOSIS: Recurrent right lower lobe pneumonia, question  of endobronchial mass.  POSTOPERATIVE DIAGNOSIS: Foreign material, vegetable matter, right  lower lobe.  PROCEDURE PERFORMED: Video bronchoscopy with removal of foreign  material and debridement of granulation tissue with fluoroscopic  Guidance.  10/13/201 OPERATIVE REPORT  PREOPERATIVE DIAGNOSIS: Foreign body, right lower lobe. PREOPERATIVE DIAGNOSIS: Foreign body, right lower lobe. SURGICAL PROCEDURE: Video bronchoscopy with removal of foreign material. SURGEON: Sheliah PlaneEdward Valgene Deloatch, MD   History of Present Illness:     Patient returns for follow up   After  having undergone Video Bronchoscopy with removal of foreign body material revealed to be vegetable matter and biopsies that did not show evidence of malignancy on 10/13/2013. Overall his symptoms are much improved though he does note occasional dry hacking cough, nonproductive, no fever or chills, no hemoptysis. After his last appointment he was referred back to the pulmonary service but they never made appointment for him. He notes that he is had trouble with depression and is currently undergoing ECT treatment.   A recent chest x-ray was done several weeks ago that showed new elevation of the left hemidiaphragm attributed to gaseous distention of the colon. Because this was a new finding the patient is had a repeat chest x-ray done today.   Past Medical History  Diagnosis Date  . Substance abuse   . Mental disorder     ADD, bipolar  . Depression   . Shortness of breath   . Anxiety     Panic  . Bipolar disorder (HCC)   . ADD  (attention deficit disorder)   . GERD (gastroesophageal reflux disease)     ? they thought GERD caused cough.   . Pneumonia 04/12/2013     History  Smoking status  . Former Smoker -- 1.00 packs/day for 20 years  . Types: Cigarettes  . Quit date: 02/16/1993  Smokeless tobacco  . Never Used    History  Alcohol Use No    Comment: none at all     No Known Allergies  Current Outpatient Prescriptions  Medication Sig Dispense Refill  . ibuprofen (ADVIL,MOTRIN) 200 MG tablet Take 400-600 mg by mouth daily as needed for headache.     . Melatonin 5 MG LOZG Place 5 mg under the tongue at bedtime.    . pantoprazole (PROTONIX) 40 MG tablet Take 40 mg by mouth every morning.    . traZODone (DESYREL) 100 MG tablet Take 50 mg by mouth at bedtime.      No current facility-administered medications for this visit.       Physical Exam: BP 144/89 mmHg  Pulse 67  Resp 16  Ht 5\' 11"  (1.803 m)  Wt 242 lb (109.77 kg)  BMI 33.77 kg/m2  SpO2 98%  General appearance: alert and cooperative Neurologic: intact Heart: regular rate and rhythm, S1, S2 normal, no murmur, click, rub or gallop Lungs: clear to auscultation bilaterally Abdomen: soft, non-tender; bowel sounds normal; no masses,  no organomegaly Extremities: extremities normal, atraumatic, no cyanosis or edema and Homans sign is negative, no sign of  DVT   Diagnostic Studies & Laboratory data:     Recent Radiology Findings:  Dg Chest 2 View  12/20/2014  CLINICAL DATA:  Adenopathy.  Prior endobronchial lesion. EXAM: CHEST  2 VIEW COMPARISON:  11/22/2014. FINDINGS: Mediastinal hilar structures normal. Right lung base infiltrate. No pleural effusion pneumothorax. Heart size normal. Stable elevation left hemidiaphragm. No acute bony abnormality. IMPRESSION: Right base infiltrate most consistent pneumonia. Electronically Signed   By: Maisie Fus  Register   On: 12/20/2014 10:51   Elevated diaphragm are new 10/6  Dg Chest 2 View  11/22/2014   CLINICAL DATA:  Cough, chest congestion, abnormal chest exam in the left lower lobe, several day history of symptoms, history of alcohol abuse and former heavy smoker EXAM: CHEST  2 VIEW COMPARISON:  PA and lateral chest x-ray of June 13, 2014 FINDINGS: The lungs are adequately inflated. There is elevation of the left hemidiaphragm likely due to gaseous distention of the stomach or bowel. This is new. The pulmonary interstitial markings are coarse and slightly more prominent today. There is no alveolar infiltrate. There is subsegmental atelectasis in the left lower lobe adjacent to the elevated hemidiaphragm. The cardiac silhouette is top-normal in size. The pulmonary vascularity is not engorged. The mediastinum is normal in width. There is tortuosity of the descending thoracic aorta. There is mild multilevel degenerative disc disease of the thoracic spine. IMPRESSION: Minimal left basilar subsegmental atelectasis. Mild interstitial prominence elsewhere. The findings likely reflect acute bronchitis. There is no alveolar pneumonia nor CHF. Electronically Signed   By: David  [place] M.D.   On: 11/22/2014 11:25    Ct Chest Wo Contrast  05/17/2014   CLINICAL DATA:  Right lower lung mass. History of " Vegetable material") removed from right lower. Ex-smoker.  EXAM: CT CHEST WITHOUT CONTRAST  TECHNIQUE: Multidetector CT imaging of the chest was performed following the standard protocol without IV contrast.  COMPARISON:  CT 11/24/2013.  FINDINGS: Mediastinum/Nodes: No axillary supraclavicular lymphadenopathy. No mediastinal or hilar hilar lymphadenopathy. No pericardial fluid.  Lungs/Pleura: There is a band of the linear thickening with central bronchiectasis in the right lower lobe decreased considerably comparison CT exam. Linear thickening measures 7.2 x 1.6 cm compared to 8.4 x 3.6 cm.  Small subpleural nodule in the left lower lobe measures 6 mm (image 43, series 4 )which is not changed from prior.  Upper abdomen:  Limited view of the liver, kidneys, pancreas are unremarkable. Normal adrenal glands.  Musculoskeletal: No aggressive osseous lesion.  IMPRESSION: 1. Significant decrease in volume of band of atelectasis with the right lower lobe. Central bronchiectasis now within the atelectasis. No suspicious nodularity associated with this lesion. 2. Stable small subpleural nodule in the left lower lobe.   Electronically Signed   By: Genevive Bi M.D.   On: 05/17/2014 11:55     I have independently reviewed the above radiology studies  and reviewed the findings with the patient.     Recent Lab Findings: Lab Results  Component Value Date   WBC 9.0 11/24/2013   HGB 12.5* 11/24/2013   HCT 35.8* 11/24/2013   PLT 339 11/24/2013   GLUCOSE 95 11/24/2013   ALT 19 11/24/2013   AST 20 11/24/2013   NA 137 11/24/2013   K 4.0 11/24/2013   CL 98 11/24/2013   CREATININE 0.85 11/24/2013   BUN 8 11/24/2013   CO2 27 11/24/2013   INR 0.99 11/24/2013      Assessment / Plan:   The patient's symptoms have improved, but he  does have occasional dry hacking cough . 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 [place] [place] [address] Office [phone]   Beeper [phone]  12/20/2014 11:11 AM

## 2014-12-28 ENCOUNTER — Other Ambulatory Visit: Payer: BLUE CROSS/BLUE SHIELD

## 2014-12-31 ENCOUNTER — Ambulatory Visit
Admission: RE | Admit: 2014-12-31 | Discharge: 2014-12-31 | Disposition: A | Discharge status: Home / Self Care | Payer: BLUE CROSS/BLUE SHIELD | Source: Ambulatory Visit | Attending: Cardiothoracic Surgery | Admitting: Cardiothoracic Surgery

## 2014-12-31 DIAGNOSIS — R05 Cough: Secondary | ICD-10-CM

## 2014-12-31 DIAGNOSIS — R053 Chronic cough: Secondary | ICD-10-CM

## 2014-12-31 DIAGNOSIS — R918 Other nonspecific abnormal finding of lung field: Secondary | ICD-10-CM

## 2015-01-03 ENCOUNTER — Other Ambulatory Visit: Payer: Self-pay | Admitting: *Deleted

## 2015-01-03 DIAGNOSIS — J986 Disorders of diaphragm: Secondary | ICD-10-CM

## 2015-01-04 ENCOUNTER — Ambulatory Visit (HOSPITAL_COMMUNITY): Payer: BLUE CROSS/BLUE SHIELD

## 2015-01-16 ENCOUNTER — Other Ambulatory Visit: Payer: Self-pay | Admitting: *Deleted

## 2015-01-16 DIAGNOSIS — T508X2S Poisoning by diagnostic agents, intentional self-harm, sequela: Secondary | ICD-10-CM

## 2015-01-17 ENCOUNTER — Other Ambulatory Visit: Payer: BLUE CROSS/BLUE SHIELD

## 2015-01-18 ENCOUNTER — Ambulatory Visit (INDEPENDENT_AMBULATORY_CARE_PROVIDER_SITE_OTHER): Payer: BLUE CROSS/BLUE SHIELD | Admitting: Emergency Medicine

## 2015-01-18 ENCOUNTER — Encounter: Payer: Self-pay | Admitting: Emergency Medicine

## 2015-01-18 VITALS — BP 118/88 | HR 51 | Ht 70.0 in | Wt 249.0 lb

## 2015-01-18 DIAGNOSIS — R0600 Dyspnea, unspecified: Secondary | ICD-10-CM

## 2015-01-18 DIAGNOSIS — R0609 Other forms of dyspnea: Secondary | ICD-10-CM | POA: Diagnosis not present

## 2015-01-18 DIAGNOSIS — R053 Chronic cough: Secondary | ICD-10-CM

## 2015-01-18 DIAGNOSIS — R05 Cough: Secondary | ICD-10-CM

## 2015-01-18 NOTE — Patient Instructions (Signed)
We will perform full pulmonary function testing to compare with your prior and reassess your breathing especially after the observation of left hemidiaphragm paralysis.  Get your CT chest in December as planned.  Follow with Dr Delton CoombesByrum next available with full PFT

## 2015-01-18 NOTE — Progress Notes (Signed)
Subjective:    Patient ID: Zachary BarterJohn B [name], male    DOB: [birth year], 59 y.o.   MRN: [id number]  HPI  PCP - K shaw  59 year old remote smoker ( quit 95) with bipolar disorder for FU of chronic cough He reports several weeks of cough and shortness of breath, intermittently since October 2014. He would get relief for a few weeks and symptoms would start again.  He smoked about 20-30 pack years before quitting in 1995.  He works as a Oceanographerpublisher.   Significant tests/ events  CT of the chest 04/11/13 showed bilateral lower lobe infiltrates compatible with multifocal pneumonia. and mediastinal lymphadenopathy, as a result patient was directly admitted. A right lower lobe nodule 8mm. Was also noted  He was treated with Rocephin and Zithromax , IV Solu-Medrol, nebulized bronchodilators and other antitussives. Tessalon was not helpful.  A respiratory virus panel was positive for respiratory syncytial virus and metapneumovirus   FU CT scan of the chest 05/30/13 showed improving infiltrates, nodule and the mediastinal Nodes.  06/12/13 Spirometry - no airway obstruction, FEV1 90%  08/03/13 CT sinuses >>Lobular soft tissue fills much of the lower portion of the right maxillary sinus  He underwent FOB 10/13/13 for foreign body in the RLL bronchus, was vegetable material. Follow up CXR's have shown improvement.    01/18/2015  Chief Complaint  Patient presents with  . Chronic Cough    Pt has seen RA in the past. Last seen in 2015. Dr. Tyrone SageGerhardt wanted pt to been seen.   59 year old man former smoker, history of bipolar disorder and prior substance abuse. Has been evaluated for cough and for  Abnormal chest imaging. He underwent bronchoscopy with removal of a foreign body from the right lower lobe bronchus as mentioned above. He had grossly normal spirometry 06/12/13. 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  He reports today that he is having exertional SOB, especially with stairs, can make him panic.  He has some wheezing, some cough that progresses thru the day. He has a repeat Ct scan chest and neck due for 01/29/15 ordered by Dr Tyrone SageGerhardt.     Past Medical History  Diagnosis Date  . Substance abuse   . Mental disorder     ADD, bipolar  . Depression   . Shortness of breath   . Anxiety     Panic  . Bipolar disorder (HCC)   . ADD (attention deficit disorder)   . GERD (gastroesophageal reflux disease)     ? they thought GERD caused cough.   . Pneumonia 04/12/2013     Review of Systems As per HPI     Objective:   Physical Exam Filed Vitals:   01/18/15 1008  BP: 118/88  Pulse: 51  Height: 5\' 10"  (1.778 m)  Weight: 249 lb (112.946 kg)  SpO2: 96%   Gen: Pleasant, well-nourished, in no distress,  normal affect  ENT: No lesions,  mouth clear,  oropharynx clear, no postnasal drip  Neck: No JVD, no TMG, no carotid bruits  Lungs: No use of accessory muscles, very soft R > L insp squeak.   Cardiovascular: RRR, heart sounds normal, no murmur or gallops, 1+ ankle peripheral edema  Musculoskeletal: No deformities, no cyanosis or clubbing  Neuro: alert, non focal  Skin: Warm, no lesions or rashes      Assessment & Plan:  Dyspnea on exertion Suspect that this is multifactorial. 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 Also suspect some obstructive lung disease based on his sx, but his spirometry in office was normal.  Will repeat full PFT, anticipate seeing his repeat CT chest this month.   We will perform full pulmonary function testing to compare with your prior and reassess your breathing especially after the observation of left hemidiaphragm paralysis.  Get your CT chest in December as planned.  Follow with Dr Delton Coombes next available with full PFT

## 2015-01-18 NOTE — Assessment & Plan Note (Signed)
Suspect that this is multifactorial. 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 Also suspect some obstructive lung disease based on his sx, but his spirometry in office was normal.  Will repeat full PFT, anticipate seeing his repeat CT chest this month.   We will perform full pulmonary function testing to compare with your prior and reassess your breathing especially after the observation of left hemidiaphragm paralysis.  Get your CT chest in December as planned.  Follow with Dr Delton CoombesByrum next available with full PFT

## 2015-01-25 ENCOUNTER — Other Ambulatory Visit: Payer: BLUE CROSS/BLUE SHIELD

## 2015-01-29 ENCOUNTER — Ambulatory Visit
Admission: RE | Admit: 2015-01-29 | Discharge: 2015-01-29 | Disposition: A | Discharge status: Home / Self Care | Payer: BLUE CROSS/BLUE SHIELD | Source: Ambulatory Visit | Attending: Cardiothoracic Surgery | Admitting: Cardiothoracic Surgery

## 2015-01-29 DIAGNOSIS — J986 Disorders of diaphragm: Secondary | ICD-10-CM

## 2015-01-29 MED ORDER — IOPAMIDOL (ISOVUE-300) INJECTION 61%: 75.0000 mL | Freq: Once | INTRAVENOUS | Status: DC | PRN

## 2015-01-29 MED ORDER — IOPAMIDOL (ISOVUE-300) INJECTION 61%
75.0000 mL | Freq: Once | INTRAVENOUS | Status: AC | PRN
  Administered 2015-01-29: 75 mL via INTRAVENOUS

## 2015-02-21 ENCOUNTER — Ambulatory Visit (INDEPENDENT_AMBULATORY_CARE_PROVIDER_SITE_OTHER): Payer: BLUE CROSS/BLUE SHIELD | Admitting: Cardiothoracic Surgery

## 2015-02-21 ENCOUNTER — Encounter: Payer: Self-pay | Admitting: Cardiothoracic Surgery

## 2015-02-21 VITALS — BP 140/88 | HR 55 | Resp 20 | Ht 70.0 in | Wt 248.0 lb

## 2015-02-21 DIAGNOSIS — T81509D Unspecified complication of foreign body accidentally left in body following unspecified procedure, subsequent encounter: Secondary | ICD-10-CM | POA: Diagnosis not present

## 2015-02-21 DIAGNOSIS — J181 Lobar pneumonia, unspecified organism: Secondary | ICD-10-CM | POA: Diagnosis not present

## 2015-02-21 NOTE — Progress Notes (Signed)
[address]       [place] [address]             [phone]      [name] [facility] Medical Record #[id number] Date of Birth: 11/24/1955  Referring: Oretha Milch, MD Primary Care: [name], MD  Chief Complaint:   POST OP FOLLOW UP DATE OF PROCEDURE: 10/13/2013   PREOPERATIVE DIAGNOSIS: Recurrent right lower lobe pneumonia, question  of endobronchial mass.  POSTOPERATIVE DIAGNOSIS: Foreign material, vegetable matter, right  lower lobe.  PROCEDURE PERFORMED: Video bronchoscopy with removal of foreign  material and debridement of granulation tissue with fluoroscopic  Guidance.  10/13/201 OPERATIVE REPORT PREOPERATIVE DIAGNOSIS: Foreign body, right lower lobe. PREOPERATIVE DIAGNOSIS: Foreign body, right lower lobe. SURGICAL PROCEDURE: Video bronchoscopy with removal of foreign material. SURGEON: Sheliah Plane, MD   History of Present Illness:     Patient returns for follow up   After  having undergone Video Bronchoscopy with removal of foreign body material revealed to be vegetable matter and biopsies that did not show evidence of malignancy on 10/13/2013. Overall his symptoms are much improved though he does note occasional dry hacking cough, nonproductive, no fever or chills, no hemoptysis. After his last appointment he was referred back to the pulmonary service, with follow-up in February 2017  A recent chest x-ray was done last montht showed new elevation of the left hemidiaphragm attributed to gaseous distention of the colon. Because this was a new finding the patient had a CT scan of the chest and neck, results below but shows new inflammatory areas in the left lower lobe chronic inflammatory changes in the right lower lobe no evidence of tumor or explanation for new elevated left hemidiaphragm see below. Patient was noted to have almost complete white out of the right maxillary sinus.  Symptom-wise the patient continues to have  chronic cough with inspiratory and expiratory wheezing and continuous congestion and nasal drainage.   Past Medical History  Diagnosis Date  . Substance abuse   . Mental disorder     ADD, bipolar  . Depression   . Shortness of breath   . Anxiety     Panic  . Bipolar disorder (HCC)   . ADD (attention deficit disorder)   . GERD (gastroesophageal reflux disease)     ? they thought GERD caused cough.   . Pneumonia 04/12/2013     History  Smoking status  . Former Smoker -- 1.00 packs/day for 20 years  . Types: Cigarettes  . Quit date: 02/16/1993  Smokeless tobacco  . Never Used    History  Alcohol Use No    Comment: none at all     No Known Allergies  Current Outpatient Prescriptions  Medication Sig Dispense Refill  . gabapentin (NEURONTIN) 600 MG tablet Take 600 mg by mouth 3 (three) times daily.    Marland Kitchen ibuprofen (ADVIL,MOTRIN) 200 MG tablet Take 400-600 mg by mouth daily as needed for headache.     . lisinopril (PRINIVIL,ZESTRIL) 10 MG tablet Take 1 tablet by mouth daily.    . Melatonin 5 MG LOZG Place 5 mg under the tongue at bedtime.    . pantoprazole (PROTONIX) 40 MG tablet Take 40 mg by mouth every morning.    . traZODone (DESYREL) 100 MG tablet Take 50 mg by mouth at bedtime.      No current facility-administered medications for this visit.       Physical Exam: BP 140/88 mmHg  Pulse 55  Resp 20  Ht 5\' 10"  (1.778 m)  Wt 248 lb (112.492 kg)  BMI 35.58 kg/m2  SpO2 98%  General appearance: alert and cooperative Neurologic: intact Heart: regular rate and rhythm, S1, S2 normal, no murmur, click, rub or gallop Lungs: Bilateral rhonchi and inspiratory neck toward wheezing Abdomen: soft, non-tender; bowel sounds normal; no masses,  no organomegaly Extremities: extremities normal, atraumatic, no cyanosis or edema and Homans sign is negative, no sign of DVT   Diagnostic Studies & Laboratory data:     Recent Radiology Findings:  Ct Soft Tissue Neck W  Contrast  01/29/2015  CLINICAL DATA:  Paralysis of the left hemidiaphragm. EXAM: CT NECK WITH CONTRAST TECHNIQUE: Multidetector CT imaging of the neck was performed using the standard protocol following the bolus administration of intravenous contrast. CONTRAST:  75mL ISOVUE-300 IOPAMIDOL (ISOVUE-300) INJECTION 61% COMPARISON:  CT chest from the same day. Sniff test 12/31/2014. A large polyp or mucous retention cyst is again seen in the right maxillary sinus with near complete opacification of the sinus. There is no expansion of the sinus. A smaller posterior inferior polyp or mucous retention cyst is present in the left maxillary sinus. There is mild mucosal thickening in the ethmoid air cells. The mastoid air cells are clear. FINDINGS: Pharynx and larynx: No focal mucosal or submucosal lesions are present. The tongue base is within normal limits. The vocal cords are midline and symmetric. Salivary glands: The submandibular glands and parotid glands are within normal limits bilaterally. Thyroid: Negative Lymph nodes: No significant adenopathy is present. Vascular: Mild calcifications are noted at the right carotid bifurcation without significant stenosis. Additional calcifications are present at the aortic arch. Limited intracranial: Within normal limits Visualized orbits: Negative Mastoids and visualized paranasal sinuses: A large polyp or mucous retention cyst is again seen in the right maxillary sinus with near complete opacification of the sinus. There is no expansion of the sinus. A smaller posterior inferior polyp or mucous retention cyst is present in the left maxillary sinus. There is mild mucosal thickening in the ethmoid air cells. The mastoid air cells are clear. Skeleton: Asymmetric left-sided facet degenerative changes are present at C4-5. Marked facet spurring and subchondral cystic change are noted. Grade 1 anterolisthesis is present at this level. There is chronic loss of disc height and  uncovertebral spurring at C5-6 and to a greater degree at C6-7. No other focal lytic or blastic lesions are present. Upper chest: No significant mediastinal adenopathy or mass lesion is present. The upper lung fields are clear. Please see dedicated chest CT report from the same day. IMPRESSION: 1. No acute or focal lesion to explain paralysis of the left hemidiaphragm. 2. Moderate spondylosis in the cervical spine with marked facet hypertrophy and spurring on the left at C4-5. 3. Prominent polyp or mucous retention cyst in the right maxillary sinus is stable, near completely opacifying the sinus. Electronically Signed   By: Marin Roberts M.D.   On: 01/29/2015 10:40   Ct Chest W Contrast  01/29/2015  CLINICAL DATA:  Paralysis of the diaphragm. EXAM: CT CHEST WITH CONTRAST TECHNIQUE: Multidetector CT imaging of the chest was performed during intravenous contrast administration. CONTRAST:  75mL ISOVUE-300 IOPAMIDOL (ISOVUE-300) INJECTION 61% COMPARISON:  05/17/2014 FINDINGS: Mediastinum: Heart size is mildly enlarged. Aortic atherosclerosis. The trachea appears patent and is midline. Normal appearance of the esophagus. Stable right hilar node measuring 1 cm. No mediastinal adenopathy. No axillary or supraclavicular adenopathy. Lungs/Pleura: No pleural fluid. New asymmetric elevation of  left hemidiaphragm. There is associated left lower lobe subsegmental atelectasis, image 35 of series 6. Within the posterior and medial left lower lobe there is a patchy area of tree-in-bud nodularity and ground-glass attenuation,, image 35 of series 6. Subsegmental chronic atelectasis and volume loss involving the anterior right lower lobe, image 38 of series 6. Unchanged from previous exam. There is associated bronchiectasis. Upper Abdomen: The adrenal glands are normal. Stable low density structure within the segment 8 of the liver measuring 8 mm, image 44 series 3. The adrenal glands are normal. The visualized portions of  the spleen and kidneys are unremarkable. The pancreas appears normal. Musculoskeletal: No aggressive lytic or sclerotic bone lesions. Partial fusion of the T3-4 vertebra. IMPRESSION: 1. Stable scarring and volume loss involving the anterior right lower lobe. Likely sequelae of chronic infection and/or aspiration. 2. New asymmetric elevation of the left hemidiaphragm with left lower lobe volume loss and focal area of subsegmental atelectasis. 3. Posterior left lower lobe ground-glass attenuation and tree-in-bud nodularity. Findings are likely the sequelae of interval inflammation/infection and possible aspiration. Electronically Signed   By: Signa Kell M.D.   On: 01/29/2015 10:28   Dg Chest 2 View  12/20/2014  CLINICAL DATA:  Adenopathy.  Prior endobronchial lesion. EXAM: CHEST  2 VIEW COMPARISON:  11/22/2014. FINDINGS: Mediastinal hilar structures normal. Right lung base infiltrate. No pleural effusion pneumothorax. Heart size normal. Stable elevation left hemidiaphragm. No acute bony abnormality. IMPRESSION: Right base infiltrate most consistent pneumonia. Electronically Signed   By: Maisie Fus  Register   On: 12/20/2014 10:51   Elevated diaphragm are new 10/6  Dg Chest 2 View  11/22/2014  CLINICAL DATA:  Cough, chest congestion, abnormal chest exam in the left lower lobe, several day history of symptoms, history of alcohol abuse and former heavy smoker EXAM: CHEST  2 VIEW COMPARISON:  PA and lateral chest x-ray of June 13, 2014 FINDINGS: The lungs are adequately inflated. There is elevation of the left hemidiaphragm likely due to gaseous distention of the stomach or bowel. This is new. The pulmonary interstitial markings are coarse and slightly more prominent today. There is no alveolar infiltrate. There is subsegmental atelectasis in the left lower lobe adjacent to the elevated hemidiaphragm. The cardiac silhouette is top-normal in size. The pulmonary vascularity is not engorged. The mediastinum is  normal in width. There is tortuosity of the descending thoracic aorta. There is mild multilevel degenerative disc disease of the thoracic spine. IMPRESSION: Minimal left basilar subsegmental atelectasis. Mild interstitial prominence elsewhere. The findings likely reflect acute bronchitis. There is no alveolar pneumonia nor CHF. Electronically Signed   By: David  [place] M.D.   On: 11/22/2014 11:25    Ct Chest Wo Contrast  05/17/2014   CLINICAL DATA:  Right lower lung mass. History of " Vegetable material") removed from right lower. Ex-smoker.  EXAM: CT CHEST WITHOUT CONTRAST  TECHNIQUE: Multidetector CT imaging of the chest was performed following the standard protocol without IV contrast.  COMPARISON:  CT 11/24/2013.  FINDINGS: Mediastinum/Nodes: No axillary supraclavicular lymphadenopathy. No mediastinal or hilar hilar lymphadenopathy. No pericardial fluid.  Lungs/Pleura: There is a band of the linear thickening with central bronchiectasis in the right lower lobe decreased considerably comparison CT exam. Linear thickening measures 7.2 x 1.6 cm compared to 8.4 x 3.6 cm.  Small subpleural nodule in the left lower lobe measures 6 mm (image 43, series 4 )which is not changed from prior.  Upper abdomen: Limited view of the liver, kidneys, pancreas are unremarkable. Normal  adrenal glands.  Musculoskeletal: No aggressive osseous lesion.  IMPRESSION: 1. Significant decrease in volume of band of atelectasis with the right lower lobe. Central bronchiectasis now within the atelectasis. No suspicious nodularity associated with this lesion. 2. Stable small subpleural nodule in the left lower lobe.   Electronically Signed   By: Genevive BiStewart  Edmunds M.D.   On: 05/17/2014 11:55     I have independently reviewed the above radiology studies  and reviewed the findings with the patient.     Recent Lab Findings: Lab Results  Component Value Date   WBC 9.0 11/24/2013   HGB 12.5* 11/24/2013   HCT 35.8* 11/24/2013   PLT 339  11/24/2013   GLUCOSE 95 11/24/2013   ALT 19 11/24/2013   AST 20 11/24/2013   NA 137 11/24/2013   K 4.0 11/24/2013   CL 98 11/24/2013   CREATININE 0.85 11/24/2013   BUN 8 11/24/2013   CO2 27 11/24/2013   INR 0.99 11/24/2013      Assessment / Plan:  Unexplained new left diaphragmatic paralysis without evidence of neck or chest malignancy as cause based on CT scan. Continued respiratory problems with chronic cough and wheezing. Right maxillary sinus opacity Patient is referred back to pulmonary service for follow-up, was to have pulmonary function studies but these have not been done yet We made an appointment with ENT to evaluate his chronic right maxillary sinus opacity Plan to see him back in 6 months with a follow-up chest x-ray to evaluate the degree of left diaphragmatic elevation     Delight OvensEdward B Walter Min MD      [address] [place] [id number] Office [phone]   Beeper [phone]  02/21/2015 10:30 AM

## 2015-02-27 ENCOUNTER — Ambulatory Visit (INDEPENDENT_AMBULATORY_CARE_PROVIDER_SITE_OTHER): Payer: BLUE CROSS/BLUE SHIELD | Admitting: Acute Care

## 2015-02-27 ENCOUNTER — Encounter: Payer: Self-pay | Admitting: Acute Care

## 2015-02-27 VITALS — BP 154/92 | HR 66 | Temp 97.9°F | Ht 70.0 in | Wt 251.8 lb

## 2015-02-27 DIAGNOSIS — J4520 Mild intermittent asthma, uncomplicated: Secondary | ICD-10-CM

## 2015-02-27 DIAGNOSIS — R0609 Other forms of dyspnea: Secondary | ICD-10-CM

## 2015-02-27 DIAGNOSIS — R0600 Dyspnea, unspecified: Secondary | ICD-10-CM

## 2015-02-27 DIAGNOSIS — J449 Chronic obstructive pulmonary disease, unspecified: Secondary | ICD-10-CM | POA: Insufficient documentation

## 2015-02-27 MED ORDER — ALBUTEROL SULFATE 108 (90 BASE) MCG/ACT IN AEPB: 1.0000 | INHALATION_SPRAY | RESPIRATORY_TRACT | Status: DC | PRN

## 2015-02-27 NOTE — Patient Instructions (Addendum)
It was nice to meet you today. We will start ProAir inhaler today to see if that helps with your shortness of breath on exertion. This is a rescue inhaler to be used every 4-6 hours as needed for shortness of breath. Your lungs were clear today, no wheezing was noted. Be aware of your triggers for wheezing ( cigarette smoke) and try to avoid them as much as possible. Follow up with Dr. Delton CoombesByrum 03/29/15 for PFT's and appointment. Please contact office for sooner follow up if symptoms do not improve or worsen or seek emergency care

## 2015-02-27 NOTE — Assessment & Plan Note (Addendum)
Pt.continues to complain of shortness of breath on exertion that then triggers an anxiety attack. Suspect this is multi-factorial due to deconditioning, Right Lower Lobe atelectasis, and left hemi diaphragm paralysis.  Plan: Start Pro-Air inhaler as rescue treatment for shortness of breath. Have RN instruct patient on proper use to maximize potential benefit of treatment. Follow up with Dr. Delton CoombesByrum 03/29/15 as previously scheduled for PFT's and appointment.

## 2015-02-27 NOTE — Assessment & Plan Note (Addendum)
Pt. Complaining of wheezing that is intermittent and self resolving after a few minutes.  Triggered by cigarette smoke/ panic attacks Wheezing usually follows anxiety and panic attack due to dyspnea on exertion and new diagnosis of elevated Left Hemi Diaphragm.  Plan: Will start on Pro Air inhaler as rescue med for DOE , which I suspect is multi-factorial due to deconditioning, weight gain,RLL ATX and Left HD paralysis. Nursing to educate patient on use of inhaler to ensure maximal benefit. Counseled to avoid triggers when possible. PFT's and follow up appointment with Dr. Delton CoombesByrum 03/29/15 for further evaluation.

## 2015-02-27 NOTE — Progress Notes (Signed)
Subjective:    Patient ID: [name], male    DOB: 1955-05-16, 60 y.o.   MRN: [id number]  HPI  60 year old remote smoker ( quit 95) with bipolar disorder  He reports several weeks of cough and shortness of breath, intermittently since October 2014. He would get relief for a few weeks and symptoms would start again.  He smoked about 20-30 pack years before quitting in 1995. New complaint of intermittent wheezing in addition to 1/17.  He works as a Animator, and at [employer].   Significant tests/ events  CT of the chest 04/11/13 showed bilateral lower lobe infiltrates compatible with multifocal pneumonia. and mediastinal lymphadenopathy, as a result patient was directly admitted. A right lower lobe nodule 8mm. Was also noted  He was treated with Rocephin and Zithromax , IV Solu-Medrol, nebulized bronchodilators and other antitussives. Tessalon was not helpful.  A respiratory virus panel was positive for respiratory syncytial virus and metapneumovirus   FU CT scan of the chest 05/30/13 showed improving infiltrates, nodule and the mediastinal Nodes.  06/12/13 Spirometry - no airway obstruction, FEV1 90%  08/03/13 CT sinuses >>Lobular soft tissue fills much of the lower portion of the right maxillary sinus  10/13/13 FOB: for foreign body in the RLL bronchus, was vegetable material. Follow up CXR's have shown improvement.  11/22/14>>> CXR: New finding of elevated left hemi-diaphragm 01/29/15>>> CT Chest w/ contrast :New asymmetric elevation of the left hemidiaphragm with left lower lobe volume loss and focal area of subsegmental atelectasis.  01/29/15 CT Soft Tissue Neck>>>No acute or focal lesion to explain paralysis of the left Hemidiaphragm. Moderate spondylosis in the cervical spine with marked facet hypertrophy and spurring on the left at C4-5. Prominent polyp or mucous retention cyst in the right maxillary sinus is stable, near completely opacifying the sinus.  02/27/15  Acute Visit for Intermittent wheeze. Pt states he is anxious about the new finding of left hemi-diaphragm elevation with unknown etiology.States he gets SOB on exertion which triggers anxiety and then he develops a wheeze.The patient was not wheezing upon exam today, and did not want to take prednisone to see if it would resolve the problem. He has noticed that the wheeze is triggered by cigarette smoke also.Denies fever , chills or hemoptysis. Past Medical History  Diagnosis Date  . Substance abuse   . Mental disorder     ADD, bipolar  . Depression   . Shortness of breath   . Anxiety     Panic  . Bipolar disorder (HCC)   . ADD (attention deficit disorder)   . GERD (gastroesophageal reflux disease)     ? they thought GERD caused cough.   . Pneumonia 04/12/2013   Current Outpatient Prescriptions on File Prior to Visit  Medication Sig Dispense Refill  . gabapentin (NEURONTIN) 600 MG tablet Take 600 mg by mouth 3 (three) times daily.    Marland Kitchen ibuprofen (ADVIL,MOTRIN) 200 MG tablet Take 400-600 mg by mouth daily as needed for headache.     . lisinopril (PRINIVIL,ZESTRIL) 10 MG tablet Take 1 tablet by mouth daily.    . Melatonin 5 MG LOZG Place 5 mg under the tongue at bedtime.    . pantoprazole (PROTONIX) 40 MG tablet Take 40 mg by mouth every morning.    . traZODone (DESYREL) 100 MG tablet Take 50 mg by mouth at bedtime.      No current facility-administered medications on file prior to visit.    Review of Systems Constitutional:   No  weight loss, night sweats,  Fevers, chills, fatigue, or  lassitude.  HEENT:   No headaches,  OccasionalDifficulty swallowing,  Tooth/dental problems, or  Sore throat,                No sneezing, itching, ear ache, nasal congestion, post nasal drip,   CV:  No chest pain,  Orthopnea, PND, swelling in lower extremities, anasarca, dizziness, palpitations, syncope.   GI  No heartburn, indigestion, abdominal pain, nausea, vomiting, diarrhea, change in bowel  habits, loss of appetite, bloody stools.   Resp: No shortness of breath with exertion or at rest.  No excess mucus, no productive cough,  No non-productive cough,  No coughing up of blood.  No change in color of mucus.  + wheezing.  No chest wall deformity  Skin: no rash or lesions.  GU: no dysuria, change in color of urine, no urgency or frequency.  No flank pain, no hematuria   MS:  No joint pain or swelling.  No decreased range of motion.  No back pain.  Psych:  No change in mood or affect. No depression + anxiety.  No memory loss.        Objective:   Physical Exam  BP 154/92 mmHg  Pulse 66  Temp(Src) 97.9 F (36.6 C) (Oral)  Ht 5\' 10"  (1.778 m)  Wt 251 lb 12.8 oz (114.216 kg)  BMI 36.13 kg/m2  SpO2 98%  Physical Exam:  General- No distress,  A&Ox3, cooperative and pleasant. ENT: No sinus tenderness, TM clear, pale nasal mucosa, no oral exudate,no post nasal drip, no LAN Cardiac: S1, S2, regular rate and rhythm, no murmur Chest: No wheeze/ rales/ bilateral rhonchi; no accessory muscle use, no nasal flaring, no sternal retractions Abd.: Soft Non-tender Ext: No edema Neuro:  normal strength Skin: No rashes, warm and dry Psych: anxious        Assessment & Plan:

## 2015-03-29 ENCOUNTER — Ambulatory Visit: Payer: BLUE CROSS/BLUE SHIELD | Admitting: Emergency Medicine

## 2015-05-06 ENCOUNTER — Ambulatory Visit (INDEPENDENT_AMBULATORY_CARE_PROVIDER_SITE_OTHER)
Admission: RE | Admit: 2015-05-06 | Discharge: 2015-05-06 | Disposition: A | Discharge status: Home / Self Care | Payer: BLUE CROSS/BLUE SHIELD | Source: Ambulatory Visit | Attending: Pulmonary Disease | Admitting: Pulmonary Disease

## 2015-05-06 ENCOUNTER — Telehealth: Payer: Self-pay | Admitting: Emergency Medicine

## 2015-05-06 ENCOUNTER — Ambulatory Visit (INDEPENDENT_AMBULATORY_CARE_PROVIDER_SITE_OTHER): Payer: BLUE CROSS/BLUE SHIELD | Admitting: Pulmonary Disease

## 2015-05-06 ENCOUNTER — Encounter: Payer: Self-pay | Admitting: Pulmonary Disease

## 2015-05-06 VITALS — BP 138/82 | HR 55 | Temp 97.7°F | Ht 70.0 in | Wt 252.0 lb

## 2015-05-06 DIAGNOSIS — R059 Cough, unspecified: Secondary | ICD-10-CM

## 2015-05-06 DIAGNOSIS — R05 Cough: Secondary | ICD-10-CM

## 2015-05-06 DIAGNOSIS — J209 Acute bronchitis, unspecified: Secondary | ICD-10-CM

## 2015-05-06 DIAGNOSIS — J4520 Mild intermittent asthma, uncomplicated: Secondary | ICD-10-CM | POA: Diagnosis not present

## 2015-05-06 MED ORDER — PREDNISONE 10 MG PO TABS: ORAL_TABLET | ORAL | Status: DC

## 2015-05-06 MED ORDER — BENZONATATE 100 MG PO CAPS: 100.0000 mg | ORAL_CAPSULE | Freq: Three times a day (TID) | ORAL | Status: DC | PRN

## 2015-05-06 NOTE — Telephone Encounter (Signed)
Spoke with patient and he states that he has had cough with white mucus, chest congestion and tightness for several days. Pt denies f/n/v/body aches/chills. Pt has not taken anything for his symptoms. Pt is wanting to be seen this morning. Advised pt that we would work on getting him seen and would let him know. Spoke with Marchelle Folksmanda about adding pt to TP's schedule, she will check with her and see. Pt is waiting in lobby.   Canary BrimBrandi Ollis, NP has agreed to see pt. Pt advised.

## 2015-05-06 NOTE — Progress Notes (Signed)
Current Outpatient Prescriptions on File Prior to Visit  Medication Sig  . Albuterol Sulfate (PROAIR RESPICLICK) 108 (90 Base) MCG/ACT AEPB Inhale 1-2 puffs into the lungs every 4 (four) hours as needed.  . cloNIDine (CATAPRES) 0.1 MG tablet Take 1 tablet by mouth 3 (three) times daily.  Marland Kitchen. gabapentin (NEURONTIN) 600 MG tablet Take 600 mg by mouth 3 (three) times daily.  . hydrOXYzine (VISTARIL) 25 MG capsule Take 1 capsule by mouth 4 (four) times daily.  Marland Kitchen. ibuprofen (ADVIL,MOTRIN) 200 MG tablet Take 400-600 mg by mouth daily as needed for headache.   . lisinopril (PRINIVIL,ZESTRIL) 10 MG tablet Take 1 tablet by mouth daily.  . Melatonin 5 MG LOZG Place 5 mg under the tongue at bedtime.  . pantoprazole (PROTONIX) 40 MG tablet Take 40 mg by mouth every morning.  . traZODone (DESYREL) 100 MG tablet Take 50 mg by mouth at bedtime.    No current facility-administered medications on file prior to visit.     Chief Complaint  Patient presents with  . Acute Visit    Pt c/o cough with white mucus, chest congestion and tightness for several days with wheezing. Coughing spells with gagging d/t intensity. Pt denies f/n/v/body aches. Pt reports having chills.     Tests 3/20 CXR >> trace right pleural effusion, no acute infiltrate  Past medical hx  has a past medical history of Substance abuse; Mental disorder; Depression; Shortness of breath; Anxiety; Bipolar disorder (HCC); ADD (attention deficit disorder); GERD (gastroesophageal reflux disease); and Pneumonia (04/12/2013).   Past surgical hx, Allergies, Family hx, Social hx all reviewed.  Vital Signs BP 138/82 mmHg  Pulse 55  Temp(Src) 97.7 F (36.5 C) (Oral)  Ht 5\' 10"  (1.778 m)  Wt 252 lb (114.306 kg)  BMI 36.16 kg/m2  SpO2 95%  History of Present Illness [name] is a 60 y.o. male, former smoker, former substance abuse (both 20 years of use, quit in 2490's) with a PMH of [facility] / [facility], ADD, GERD and  prior PNA who presents to office on 3/20 with acute complaints of 48 hours of cough, yellow sputum production and increased wheezing.    The patient denies fevers, chills, nausea, vomiting, diarrhea.  He reports he wheezes on a normal basis and uses occasional albuterol.  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  He notes his activity level has not been the same as of late and he has had difficulty climbing stairs due to shortness of breath.  Denies lower extremity swelling.   Physical Exam  General - well developed adult in no acute distress ENT - No sinus tenderness, no oral exudate, no LAN Cardiac - s1s2 regular, no murmur Chest - even/non-labored, lungs bilaterally with wheezing Back - No focal tenderness Abd - Soft, non-tender Ext - No edema Neuro - Normal strength Skin - No rashes Psych - appears anxious but not in distress   Assessment/Plan  Acute Bronchitis - suspect acute viral illness superimposed on possible obstructive lung disease.    Plan: Prednisone taper  PRN albuterol inhaler as needed for wheezing  Tessalon for cough or OTC delsym CXR images personally reviewed, no evidence of PNA Follow up with Dr. 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  Complete prednisone as prescribed 2.  Use your albuterol inhaler as needed for wheezing  or shortness of breath 3.  Use Tessalon per instructions as needed for cough.  You may also use over the counter Delsym if needed (ok to buy the generic) 4.  Your chest XRAY does not show evidence of pneumonia     Canary Brim, NP-C [facility]  [phone] 05/06/2015, 5:24 PM

## 2015-05-06 NOTE — Patient Instructions (Addendum)
1.  Complete prednisone as prescribed 2.  Use your albuterol inhaler as needed for wheezing or shortness of breath 3.  Use Tessalon per instructions as needed for cough.  You may also use over the counter Delsym if needed (ok to buy the generic) 4.  Your chest XRAY does not show evidence of pneumonia

## 2015-05-14 ENCOUNTER — Other Ambulatory Visit (HOSPITAL_COMMUNITY): Payer: Self-pay | Admitting: Family Medicine

## 2015-05-14 DIAGNOSIS — R0600 Dyspnea, unspecified: Secondary | ICD-10-CM

## 2015-05-23 ENCOUNTER — Encounter: Payer: Self-pay | Admitting: Emergency Medicine

## 2015-05-23 ENCOUNTER — Ambulatory Visit (INDEPENDENT_AMBULATORY_CARE_PROVIDER_SITE_OTHER): Payer: BLUE CROSS/BLUE SHIELD | Admitting: Emergency Medicine

## 2015-05-23 VITALS — BP 142/84 | HR 87 | Ht 70.0 in | Wt 253.0 lb

## 2015-05-23 DIAGNOSIS — R053 Chronic cough: Secondary | ICD-10-CM

## 2015-05-23 DIAGNOSIS — J449 Chronic obstructive pulmonary disease, unspecified: Secondary | ICD-10-CM | POA: Diagnosis not present

## 2015-05-23 DIAGNOSIS — R05 Cough: Secondary | ICD-10-CM

## 2015-05-23 LAB — PULMONARY FUNCTION TEST
DL/VA % PRED: 117 %
DL/VA: 5.54 ml/min/mmHg/L
DLCO COR: 32.29 ml/min/mmHg
DLCO cor % pred: 92 %
DLCO unc % pred: 92 %
DLCO unc: 32.56 ml/min/mmHg
FEF 25-75 POST: 3.85 L/s
FEF 25-75 PRE: 2.6 L/s
FEF2575-%CHANGE-POST: 48 %
FEF2575-%PRED-POST: 125 %
FEF2575-%PRED-PRE: 84 %
FEV1-%Change-Post: 17 %
FEV1-%PRED-PRE: 69 %
FEV1-%Pred-Post: 81 %
FEV1-Post: 3.11 L
FEV1-Pre: 2.66 L
FEV1FVC-%CHANGE-POST: 0 %
FEV1FVC-%PRED-PRE: 103 %
FEV6-%CHANGE-POST: 21 %
FEV6-%Pred-Post: 82 %
FEV6-%Pred-Pre: 68 %
FEV6-Post: 3.98 L
FEV6-Pre: 3.29 L
FEV6FVC-%Change-Post: 0 %
FEV6FVC-%Pred-Post: 105 %
FEV6FVC-%Pred-Pre: 104 %
FVC-%CHANGE-POST: 16 %
FVC-%PRED-POST: 78 %
FVC-%Pred-Pre: 67 %
FVC-Post: 3.98 L
FVC-Pre: 3.42 L
POST FEV1/FVC RATIO: 78 %
PRE FEV1/FVC RATIO: 78 %
Post FEV6/FVC ratio: 100 %
Pre FEV6/FVC Ratio: 100 %

## 2015-05-23 NOTE — Patient Instructions (Signed)
We will try starting Symbicort 2 puffs twice a day  Keep ProAir available to use 2 puffs up to every 4 hours if needed for shortness of breath.  Follow with Dr Delton CoombesByrum in 2 months or sooner if you have any problems.

## 2015-05-23 NOTE — Assessment & Plan Note (Signed)
COPD with asthma crossover based on spirometry today. I'll like to try him on Symbicort 2 puffs twice a day to see if he benefits. Albuterol prn.

## 2015-05-23 NOTE — Progress Notes (Signed)
   Subjective:    Patient ID: [name], male    DOB: 10/27/1955, 60 y.o.   MRN: [id number]  HPI  05/23/2015  Chief Complaint  Patient presents with  . Follow-up    PFT results. no new complaints today.   60 year old man former smoker, history of bipolar disorder and prior substance abuse. Has been evaluated for cough and for  Abnormal chest imaging. He underwent bronchoscopy with removal of a foreign body from the right lower lobe bronchus as mentioned above. He had grossly normal spirometry 06/12/13. 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  He reports today that he is having exertional SOB, especially with stairs, can make him panic. He has some wheezing, some cough that progresses thru the day. He has a repeat Ct scan chest and neck due for 01/29/15 ordered by Dr Tyrone SageGerhardt.    ROV 05/23/15 -- follow-up visit for patient with a history of tobacco use, bipolar disease (last ECT was 3 weeks ago) and prior substance abuse who we have followed for COPD and cough. He also has a history of severe bronchoscopy and removal of foreign body material (vegetable matter) in 09/2013, and an elevated left hemidiaphragm. His most recent chest x-ray was 05/06/15 in this office which I have personally reviewed. This showed bibasilar subsegmental atelectasis.  He was recently given pred for an AE-COPD, but did not take it because he saw Dr Manus GunningEhinger and he had ECG changes > [facility] ([facility]).   He is coughing every day, has been worse lately. He rarely uses albuterol, has not seen much benefit. He feel dyspnea and this is his most bothersome sx. Happens with stairs, exertion. He underwent pulmonary function testing today that I have personally reviewed. His spirometry shows mixed obstruction and restriction with a moderately severe decrease in his FEV1 and a significant improvement following bronchodilator. His lung volumes confirm restriction.    Past Medical History    Diagnosis Date  . Substance abuse   . Mental disorder     ADD, bipolar  . Depression   . Shortness of breath   . Anxiety     Panic  . Bipolar disorder (HCC)   . ADD (attention deficit disorder)   . GERD (gastroesophageal reflux disease)     ? they thought GERD caused cough.   . Pneumonia 04/12/2013     Review of Systems As per HPI     Objective:   Physical Exam Filed Vitals:   05/23/15 1600  BP: 142/84  Pulse: 87  Height: 5\' 10"  (1.778 m)  Weight: 253 lb (114.76 kg)  SpO2: 96%   Gen: Pleasant, well-nourished, in no distress,  normal affect  ENT: No lesions,  mouth clear,  oropharynx clear, no postnasal drip  Neck: No JVD, no TMG, no carotid bruits  Lungs: No use of accessory muscles, no wheezing or crackles  Cardiovascular: RRR, heart sounds normal, no murmur or gallops, no edema  Musculoskeletal: No deformities, no cyanosis or clubbing  Neuro: alert, non focal  Skin: Warm, no lesions or rashes      Assessment & Plan:  COPD (chronic obstructive pulmonary disease) (HCC) COPD with asthma crossover based on spirometry today. I'll like to try him on Symbicort 2 puffs twice a day to see if he benefits. Albuterol prn.

## 2015-05-23 NOTE — Progress Notes (Signed)
PFT done today. 

## 2015-05-27 ENCOUNTER — Other Ambulatory Visit (HOSPITAL_COMMUNITY): Payer: BLUE CROSS/BLUE SHIELD

## 2015-05-29 ENCOUNTER — Telehealth: Payer: Self-pay | Admitting: Emergency Medicine

## 2015-05-29 MED ORDER — BUDESONIDE-FORMOTEROL FUMARATE 160-4.5 MCG/ACT IN AERO: 2.0000 | INHALATION_SPRAY | Freq: Two times a day (BID) | RESPIRATORY_TRACT | Status: DC

## 2015-05-29 NOTE — Telephone Encounter (Signed)
Spoke with pt. Pt would like to a prescription for Symbicort sent to his pharmacy. This has been sent in. Nothing further was needed.

## 2015-06-07 ENCOUNTER — Ambulatory Visit: Payer: BLUE CROSS/BLUE SHIELD | Admitting: Internal Medicine

## 2015-08-08 ENCOUNTER — Encounter: Payer: Self-pay | Admitting: Emergency Medicine

## 2015-08-08 ENCOUNTER — Ambulatory Visit (INDEPENDENT_AMBULATORY_CARE_PROVIDER_SITE_OTHER): Payer: BLUE CROSS/BLUE SHIELD | Admitting: Emergency Medicine

## 2015-08-08 VITALS — BP 102/68 | HR 56 | Ht 70.0 in | Wt 254.0 lb

## 2015-08-08 DIAGNOSIS — R05 Cough: Secondary | ICD-10-CM

## 2015-08-08 DIAGNOSIS — R053 Chronic cough: Secondary | ICD-10-CM

## 2015-08-08 DIAGNOSIS — J449 Chronic obstructive pulmonary disease, unspecified: Secondary | ICD-10-CM

## 2015-08-08 DIAGNOSIS — J310 Chronic rhinitis: Secondary | ICD-10-CM

## 2015-08-08 NOTE — Patient Instructions (Addendum)
Please continue symbicort 2 puffs twice a day.  We will try starting loratadine 10mg  daily Please start Pro-Air, albuterol 2 puffs up to every 4 hours if needed for shortness of breath.  If yo notice that your congestion does not get better with loratadine then call us. We may decide to add or substitute flonase spray.  Follow with Dr Delton CoombesByrum in 6 months or sooner if you have any problems

## 2015-08-08 NOTE — Assessment & Plan Note (Signed)
We will try starting loratadine 10mg  daily If yo notice that your congestion does not get better with loratadine then call us. We may decide to add or substitute flonase spray.

## 2015-08-08 NOTE — Assessment & Plan Note (Signed)
Please continue symbicort 2 puffs twice a day.  Please start Pro-Air, albuterol 2 puffs up to every 4 hours if needed for shortness of breath.  Follow with Dr Delton CoombesByrum in 6 months or sooner if you have any problems

## 2015-08-08 NOTE — Progress Notes (Signed)
Subjective:    Patient ID: [name], male    DOB: 1956-02-10, 60 y.o.   MRN: [id number]  HPI  08/08/2015  Chief Complaint  Patient presents with  . Follow-up    Pt c/o continued cough with white/yellow mucus, wheeze and occasional SOB. Pt also c/o constant pnd and throat clearing. Pt has not needed rescure inhaler frequently. No other complaints or concerns.    60 year old man former smoker, history of bipolar disorder and prior substance abuse. Has been evaluated for cough and for  Abnormal chest imaging. He underwent bronchoscopy with removal of a foreign body from the right lower lobe bronchus as mentioned above. He had grossly normal spirometry 06/12/13. 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  He reports today that he is having exertional SOB, especially with stairs, can make him panic. He has some wheezing, some cough that progresses thru the day. He has a repeat Ct scan chest and neck due for 01/29/15 ordered by Dr Tyrone SageGerhardt.    ROV 05/23/15 -- follow-up visit for patient with a history of tobacco use, bipolar disease (last ECT was 3 weeks ago) and prior substance abuse who we have followed for COPD and cough. He also has a history of severe bronchoscopy and removal of foreign body material (vegetable matter) in 09/2013, and an elevated left hemidiaphragm. His most recent chest x-ray was 05/06/15 in this office which I have personally reviewed. This showed bibasilar subsegmental atelectasis.  He was recently given pred for an AE-COPD, but did not take it because he saw Dr Manus GunningEhinger and he had ECG changes > was [facility]).   He is coughing every day, has been worse lately. He rarely uses albuterol, has not seen much benefit. He feel dyspnea and this is his most bothersome sx. Happens with stairs, exertion. He underwent pulmonary function testing today that I have personally reviewed. His spirometry shows mixed obstruction and restriction with a  moderately severe decrease in his FEV1 and a significant improvement following bronchodilator. His lung volumes confirm restriction.   ROV 07/19/15 -- follow-up visit for history of tobacco use and dyspnea with chronic cough. 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 At our last visit based on obstruction on his spirometry we started Symbicort twice a day. He feels that this has been beneficial, breathing is better. His voice has changed some with it, maybe some hoarseness. He is having some cough, a lot of congestion. He recalls being on flonase before, can't recall how helpful it was.  Past Medical History  Diagnosis Date  . Substance abuse   . Mental disorder     ADD, bipolar  . Depression   . Shortness of breath   . Anxiety     Panic  . Bipolar disorder (HCC)   . ADD (attention deficit disorder)   . GERD (gastroesophageal reflux disease)     ? they thought GERD caused cough.   . Pneumonia 04/12/2013     Review of Systems As per HPI     Objective:   Physical Exam Filed Vitals:   08/08/15 0932  BP: 102/68  Pulse: 56  Height: 5\' 10"  (1.778 m)  Weight: 254 lb (115.214 kg)  SpO2: 95%   Gen: Pleasant, well-nourished, in no distress,  normal affect  ENT: No lesions,  mouth clear,  oropharynx clear, no postnasal drip  Neck: No JVD, no TMG, no carotid bruits  Lungs: No use of accessory muscles, no wheezing or crackles  Cardiovascular: RRR, heart sounds normal, no murmur or gallops, no edema  Musculoskeletal: No deformities, no cyanosis or clubbing  Neuro: alert, non focal  Skin: Warm, no lesions or rashes      Assessment & Plan:  COPD (chronic obstructive  pulmonary disease) (HCC) Please continue symbicort 2 puffs twice a day.  Please start Pro-Air, albuterol 2 puffs up to every 4 hours if needed for shortness of breath.  Follow with Dr Delton CoombesByrum in 6 months or sooner if you have any problems  Chronic cough We will try starting loratadine 10mg  daily If yo notice that your congestion does not get better with loratadine then call us. We may decide to add or substitute flonase spray.     Levy Pupaobert Chrysa Rampy, MD, PhD 08/08/2015, 9:47 AM [facility] [phone] or if no answer [phone]

## 2015-08-09 LAB — LIPID PANEL
Cholesterol: 137 (ref 0–200)
HDL: 44 (ref 35–70)
LDL Cholesterol: 84
Triglycerides: 46 (ref 40–160)

## 2015-08-09 LAB — PSA: PSA: 2.03

## 2015-09-04 ENCOUNTER — Other Ambulatory Visit: Payer: Self-pay | Admitting: Cardiothoracic Surgery

## 2015-09-04 DIAGNOSIS — R918 Other nonspecific abnormal finding of lung field: Secondary | ICD-10-CM

## 2015-09-05 ENCOUNTER — Ambulatory Visit: Payer: BLUE CROSS/BLUE SHIELD | Admitting: Cardiothoracic Surgery

## 2015-09-13 ENCOUNTER — Telehealth: Payer: Self-pay | Admitting: Emergency Medicine

## 2015-09-13 MED ORDER — AZITHROMYCIN 250 MG PO TABS: ORAL_TABLET | ORAL | 0 refills | Status: AC

## 2015-09-13 NOTE — Telephone Encounter (Signed)
Spoke with pt, c/o increased PND, chest congestion, difficulty swallowing, sore throat, mostly nonprod cough with some yellow mucus, SOB X3-4 days.  Denies fever, chest pain.  Pt has been taking mucinex which has not helped with his symptoms. Requesting further recs.    Pt uses [facility].     Sending to DOD as RB is off today. MW please advise on recs.  Thanks!   Instructions   Please continue symbicort 2 puffs twice a day.  We will try starting loratadine 10mg  daily Please start Pro-Air, albuterol 2 puffs up to every 4 hours if needed for shortness of breath.  If yo notice that your congestion does not get better with loratadine then call us. We may decide to add or substitute flonase spray.  Follow with Dr Delton Coombes in 6 months or sooner if you have any problems

## 2015-09-13 NOTE — Telephone Encounter (Signed)
Spoke with pt. He is aware of MW's recommendations. Rx has been sent in. Nothing further was needed. 

## 2015-09-13 NOTE — Telephone Encounter (Signed)
zpak and f/u with np next week if not better as this may be an ACEi effect

## 2015-09-26 ENCOUNTER — Ambulatory Visit
Admission: RE | Admit: 2015-09-26 | Discharge: 2015-09-26 | Disposition: A | Discharge status: Home / Self Care | Payer: BLUE CROSS/BLUE SHIELD | Source: Ambulatory Visit | Attending: Cardiothoracic Surgery | Admitting: Cardiothoracic Surgery

## 2015-09-26 ENCOUNTER — Encounter: Payer: Self-pay | Admitting: Cardiothoracic Surgery

## 2015-09-26 ENCOUNTER — Ambulatory Visit (INDEPENDENT_AMBULATORY_CARE_PROVIDER_SITE_OTHER): Payer: BLUE CROSS/BLUE SHIELD | Admitting: Cardiothoracic Surgery

## 2015-09-26 VITALS — BP 160/90 | HR 60 | Resp 18 | Ht 70.0 in | Wt 250.0 lb

## 2015-09-26 DIAGNOSIS — J181 Lobar pneumonia, unspecified organism: Secondary | ICD-10-CM

## 2015-09-26 DIAGNOSIS — T81509D Unspecified complication of foreign body accidentally left in body following unspecified procedure, subsequent encounter: Secondary | ICD-10-CM | POA: Diagnosis not present

## 2015-09-26 DIAGNOSIS — R911 Solitary pulmonary nodule: Secondary | ICD-10-CM | POA: Diagnosis not present

## 2015-09-26 DIAGNOSIS — R918 Other nonspecific abnormal finding of lung field: Secondary | ICD-10-CM

## 2015-09-26 NOTE — Progress Notes (Signed)
[address]       [place] [address]             [phone]      MACKY GALIK [facility] Medical Record #[id number] Date of Birth: October 08, 1955  Referring: Oretha Milch, MD Primary Care: [name], MD  Chief Complaint:   POST OP FOLLOW UP DATE OF PROCEDURE: 10/13/2013   PREOPERATIVE DIAGNOSIS: Recurrent right lower lobe pneumonia, question  of endobronchial mass.  POSTOPERATIVE DIAGNOSIS: Foreign material, vegetable matter, right  lower lobe.  PROCEDURE PERFORMED: Video bronchoscopy with removal of foreign  material and debridement of granulation tissue with fluoroscopic  Guidance.  11/28/2013 OPERATIVE REPORT PREOPERATIVE DIAGNOSIS: Foreign body, right lower lobe. PREOPERATIVE DIAGNOSIS: Foreign body, right lower lobe. SURGICAL PROCEDURE: Video bronchoscopy with removal of foreign material. SURGEON: Sheliah Plane, MD   History of Present Illness:     Patient returns for follow up   After  having undergone Video Bronchoscopy with removal of foreign body material revealed to be vegetable matter and biopsies that did not show evidence of malignancy on 10/13/2013. Overall his symptoms are much improved though he does note occasional dry hacking cough, nonproductive, no fever or chills, no hemoptysis. After his last appointment he was referred back to the pulmonary service, with follow-up in February 2017. Since he is being followed by Dr. Delton Coombes.   A recent chest x-ray  done last year showed new elevation of the left hemidiaphragm attributed to gaseous distention of the colon. Because this was a new finding the patient had a CT scan of the chest and neck, results below but shows new inflammatory areas in the left lower lobe chronic inflammatory changes in the right lower lobe no evidence of tumor or explanation for new elevated left hemidiaphragm see below. Patient was noted to have almost complete white out of the right maxillary sinus. He was  referred to ENT for this but never kept the appointment  Symptom-wise the patient continues to have chronic cough with inspiratory and expiratory wheezing and continuous congestion and nasal drainage., Somewhat improved recently. He is very concerned about his diagnosis of COPD.   Past Medical History:  Diagnosis Date  . ADD (attention deficit disorder)   . Anxiety    Panic  . Bipolar disorder (HCC)   . Depression   . GERD (gastroesophageal reflux disease)    ? they thought GERD caused cough.   . Mental disorder    ADD, bipolar  . Pneumonia 04/12/2013  . Shortness of breath   . Substance abuse      History  Smoking Status  . Former Smoker  . Packs/day: 1.00  . Years: 20.00  . Types: Cigarettes  . Quit date: 02/16/1993  Smokeless Tobacco  . Never Used    History  Alcohol Use No    Comment: none at all     No Known Allergies  Current Outpatient Prescriptions  Medication Sig Dispense Refill  . Albuterol Sulfate (PROAIR RESPICLICK) 108 (90 Base) MCG/ACT AEPB Inhale 1-2 puffs into the lungs every 4 (four) hours as needed. 1 each 3  . benzonatate (TESSALON) 100 MG capsule Take 1 capsule (100 mg total) by mouth 3 (three) times daily as needed for cough. 30 capsule 1  . budesonide-formoterol (SYMBICORT) 160-4.5 MCG/ACT inhaler Inhale 2 puffs into the lungs 2 (two) times daily. 1 Inhaler 5  . cloNIDine (CATAPRES) 0.1 MG tablet Take 1 tablet by mouth 3 (three) times daily.  1  .  gabapentin (NEURONTIN) 800 MG tablet Take 1 tablet by mouth 4 (four) times daily.  2  . hydrOXYzine (VISTARIL) 25 MG capsule Take 1 capsule by mouth 4 (four) times daily.  5  . ibuprofen (ADVIL,MOTRIN) 200 MG tablet Take 400-600 mg by mouth daily as needed for headache.     . lisinopril (PRINIVIL,ZESTRIL) 10 MG tablet Take 1 tablet by mouth daily.    . Melatonin 5 MG LOZG Place 5 mg under the tongue at bedtime.    . pantoprazole (PROTONIX) 40 MG tablet Take 40 mg by mouth every morning.    . sertraline  (ZOLOFT) 100 MG tablet Take 200 mg by mouth daily.   1  . traZODone (DESYREL) 100 MG tablet Take 50 mg by mouth at bedtime.      No current facility-administered medications for this visit.        Physical Exam: There were no vitals taken for this visit.  General appearance: alert and cooperative Neurologic: intact Heart: regular rate and rhythm, S1, S2 normal, no murmur, click, rub or gallop Lungs: Bilateral rhonchi and inspiratory neck toward wheezing Abdomen: soft, non-tender; bowel sounds normal; no masses,  no organomegaly Extremities: extremities normal, atraumatic, no cyanosis or edema and Homans sign is negative, no sign of DVT   Diagnostic Studies & Laboratory data:     Recent Radiology Findings:   Dg Chest 2 View  Result Date: 09/26/2015 CLINICAL DATA:  COPD, shortness of breath, dysphagia, history of previous foreign body lung lesion removed by bronchoscopy EXAM: CHEST  2 VIEW COMPARISON:  Chest x-ray of 05/06/2015, CT chest of 01/29/2015 and 11/24/2013 FINDINGS: No active infiltrate or effusion is seen. Mediastinal and hilar contours are unremarkable. The heart is within normal limits in size. No bony abnormality is seen. IMPRESSION: No active cardiopulmonary disease. Electronically Signed   By: Dwyane DeePaul  Barry M.D.   On: 09/26/2015 16:23    Dg Chest 2 View  12/20/2014  CLINICAL DATA:  Adenopathy.  Prior endobronchial lesion. EXAM: CHEST  2 VIEW COMPARISON:  11/22/2014. FINDINGS: Mediastinal hilar structures normal. Right lung base infiltrate. No pleural effusion pneumothorax. Heart size normal. Stable elevation left hemidiaphragm. No acute bony abnormality. IMPRESSION: Right base infiltrate most consistent pneumonia. Electronically Signed   By: Maisie Fushomas  Register   On: 12/20/2014 10:51   Elevated diaphragm are new 10/6  Dg Chest 2 View  11/22/2014  CLINICAL DATA:  Cough, chest congestion, abnormal chest exam in the left lower lobe, several day history of symptoms, history of  alcohol abuse and former heavy smoker EXAM: CHEST  2 VIEW COMPARISON:  PA and lateral chest x-ray of June 13, 2014 FINDINGS: The lungs are adequately inflated. There is elevation of the left hemidiaphragm likely due to gaseous distention of the stomach or bowel. This is new. The pulmonary interstitial markings are coarse and slightly more prominent today. There is no alveolar infiltrate. There is subsegmental atelectasis in the left lower lobe adjacent to the elevated hemidiaphragm. The cardiac silhouette is top-normal in size. The pulmonary vascularity is not engorged. The mediastinum is normal in width. There is tortuosity of the descending thoracic aorta. There is mild multilevel degenerative disc disease of the thoracic spine. IMPRESSION: Minimal left basilar subsegmental atelectasis. Mild interstitial prominence elsewhere. The findings likely reflect acute bronchitis. There is no alveolar pneumonia nor CHF. Electronically Signed   By: David  Swazil[place]Jordan M.D.   On: 11/22/2014 11:25    Ct Chest Wo Contrast  05/17/2014   CLINICAL DATA:  Right lower  lung mass. History of " Vegetable material") removed from right lower. Ex-smoker.  EXAM: CT CHEST WITHOUT CONTRAST  TECHNIQUE: Multidetector CT imaging of the chest was performed following the standard protocol without IV contrast.  COMPARISON:  CT 11/24/2013.  FINDINGS: Mediastinum/Nodes: No axillary supraclavicular lymphadenopathy. No mediastinal or hilar hilar lymphadenopathy. No pericardial fluid.  Lungs/Pleura: There is a band of the linear thickening with central bronchiectasis in the right lower lobe decreased considerably comparison CT exam. Linear thickening measures 7.2 x 1.6 cm compared to 8.4 x 3.6 cm.  Small subpleural nodule in the left lower lobe measures 6 mm (image 43, series 4 )which is not changed from prior.  Upper abdomen: Limited view of the liver, kidneys, pancreas are unremarkable. Normal adrenal glands.  Musculoskeletal: No aggressive osseous  lesion.  IMPRESSION: 1. Significant decrease in volume of band of atelectasis with the right lower lobe. Central bronchiectasis now within the atelectasis. No suspicious nodularity associated with this lesion. 2. Stable small subpleural nodule in the left lower lobe.   Electronically Signed   By: Genevive Bi M.D.   On: 05/17/2014 11:55     I have independently reviewed the above radiology studies  and reviewed the findings with the patient.     Recent Lab Findings: Lab Results  Component Value Date   WBC 9.0 11/24/2013   HGB 12.5 (L) 11/24/2013   HCT 35.8 (L) 11/24/2013   PLT 339 11/24/2013   GLUCOSE 95 11/24/2013   ALT 19 11/24/2013   AST 20 11/24/2013   NA 137 11/24/2013   K 4.0 11/24/2013   CL 98 11/24/2013   CREATININE 0.85 11/24/2013   BUN 8 11/24/2013   CO2 27 11/24/2013   INR 0.99 11/24/2013      Assessment / Plan:  Unexplained new left diaphragmatic paralysis without evidence of neck or chest malignancy as cause based on CT scan.- Chest x-ray today appears to show complete resolution of his elevated left hemidiaphragm Continued respiratory problems with chronic cough and wheezing. Right maxillary sinus opacity- on previous studies patient did not keep his appointment for evaluation of this. I plan to see the patient back as needed, as he is followed in the pulmonary clinic.   Delight Ovens MD      [address] [place] [place] [address] Office [phone]   Beeper [phone]  09/26/2015 10:18 AM

## 2015-10-01 ENCOUNTER — Telehealth: Payer: Self-pay | Admitting: Emergency Medicine

## 2015-10-01 NOTE — Telephone Encounter (Signed)
Patient states it is hard for him to swallow, has a lot of thick congestion stuck in his throat and will not come out, tries to clear his throat all the time.  Not affecting his breathing, but advised patient that if it does start to affect his breathing he needs to go to ER.    No Known Allergies  [facility]  TP - please advise in RB's absence.

## 2015-10-01 NOTE — Telephone Encounter (Signed)
Patient notified of TP's recommendations. Nothing further needed.  

## 2015-10-01 NOTE — Telephone Encounter (Signed)
Can try zyrtec 10mg  At bedtime  As needed  Drainage.  If this is getting worse or can not swallow will need to be seen sooner or go to ER.  Make sure he does not have any tongue or lips swelling as he is on ACE inhibitor. ???? Please contact office for sooner follow up if symptoms do not improve or worsen or seek emergency care

## 2015-10-03 ENCOUNTER — Telehealth: Payer: Self-pay | Admitting: Emergency Medicine

## 2015-10-03 MED ORDER — NYSTATIN 100000 UNIT/ML MT SUSP: 5.0000 mL | Freq: Three times a day (TID) | OROMUCOSAL | 0 refills | Status: DC

## 2015-10-03 NOTE — Telephone Encounter (Signed)
Per RB recs earlier: Note    Please ask him to stop his symbicort to see if this helps these symptoms.  He need s an OV to f/u and discuss next steps, alternate inhaler, etc. . If his tongue remains enlarged or if his breathing feels compromised due the tongue / throat then he needs to go to the ED    -----  Spoke with spouse. She reports when pt tongue was swollen earlier, he bit his tongue twice and is now bleeding profusely. She is afraid it will get infected and wants to know what she can do to help prevent infection. She reports she does not feel this is something pt needs to see PCP for or go to the ED for. Please advise RB thanks

## 2015-10-03 NOTE — Telephone Encounter (Signed)
Spoke with spouse and is aware of recs. RX sent in. Nothing further needed

## 2015-10-03 NOTE — Telephone Encounter (Signed)
Please ask him to stop his symbicort to see if this helps these symptoms.  He need s an OV to f/u and discuss next steps, alternate inhaler, etc. . If his tongue remains enlarged or if his breathing feels compromised due the tongue / throat then he needs to go to the ED

## 2015-10-03 NOTE — Telephone Encounter (Signed)
Nystatin swish and spit, 100000 u/cc, take 5cc PO tid for 7 days. Have him hold in mouth for as long as possible before spitting it out.

## 2015-10-03 NOTE — Telephone Encounter (Signed)
Pt is aware of recs below. appt scheduled to see RB. He voiced understanding if his tongue remians enlarged or has trouble swallowing or breathing to go to the ED. Nothing further needed

## 2015-10-03 NOTE — Telephone Encounter (Signed)
Called spoke Synetta Failnita (spouse). She is aware of below. She is now stating she has googled thrush and now believes he has this. Reports he has a white coated tongue and it has bumps on it. She reports she googled pics and it looks like thrush for sure. She is now requesting something to be called in. Please advise RB thanks

## 2015-10-03 NOTE — Telephone Encounter (Signed)
Spoke with pt. States that he is having trouble swallowing and his tongue is swelling. Reports that this has been going on for a while. Does not believe it's an allergic reaction to anything. Stated, "When I swallow I feel like I have a hunk of mucus stuck." In regards to the tongue swelling, pt stated "It's almost like my tongue is to big for my mouth, I keep biting my tongue." Pt would like RB's recommendations.  RB - please advise. Thanks.

## 2015-10-03 NOTE — Telephone Encounter (Signed)
There is nothing to do to decrease the chance of infection. Sorry.

## 2015-10-10 ENCOUNTER — Encounter: Payer: Self-pay | Admitting: Emergency Medicine

## 2015-10-10 ENCOUNTER — Ambulatory Visit (INDEPENDENT_AMBULATORY_CARE_PROVIDER_SITE_OTHER): Payer: BLUE CROSS/BLUE SHIELD | Admitting: Emergency Medicine

## 2015-10-10 DIAGNOSIS — K149 Disease of tongue, unspecified: Secondary | ICD-10-CM | POA: Diagnosis not present

## 2015-10-10 MED ORDER — VALSARTAN 80 MG PO TABS: 80.0000 mg | ORAL_TABLET | Freq: Every day | ORAL | 2 refills | Status: DC

## 2015-10-10 NOTE — Assessment & Plan Note (Signed)
Clearly misses the Symbicort from a breathing standpoint. I will try to restart. If he develops upper airway side effects and this will have to be stopped immediately and we will defer an alternative.

## 2015-10-10 NOTE — Progress Notes (Signed)
Subjective:    Patient ID: [name], male    DOB: Mar 09, 1955, 60 y.o.   MRN: [id number]  HPI  10/10/2015  Chief Complaint  Patient presents with  . COPD    Discuss inhaler options.   60 year old man former smoker, history of bipolar disorder and prior substance abuse. Has been evaluated for cough and for  Abnormal chest imaging. He underwent bronchoscopy with removal of a foreign body from the right lower lobe bronchus as mentioned above. He had grossly normal spirometry 06/12/13. 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  He reports today that he is having exertional SOB, especially with stairs, can make him panic. He has some wheezing, some cough that progresses thru the day. He has a repeat Ct scan chest and neck due for 01/29/15 ordered by Dr Tyrone SageGerhardt.    ROV 05/23/15 -- follow-up visit for patient with a history of tobacco use, bipolar disease (last ECT was 3 weeks ago) and prior substance abuse who we have followed for COPD and cough. He also has a history of severe bronchoscopy and removal of foreign body material (vegetable matter) in 09/2013, and an elevated left hemidiaphragm. His most recent chest x-ray was 05/06/15 in this office which I have personally reviewed. This showed bibasilar subsegmental atelectasis.  He was recently given pred for an AE-COPD, but did not take it because he saw Dr Manus GunningEhinger and he had ECG changes > was [facility] (where [facility]).   He is coughing every day, has been worse lately. He rarely uses albuterol, has not seen much benefit. He feel dyspnea and this is his most bothersome sx. Happens with stairs, exertion. He underwent pulmonary function testing today that I have personally reviewed. His spirometry shows mixed obstruction and restriction with a moderately severe decrease in his FEV1 and a significant improvement following bronchodilator. His lung volumes confirm restriction.   ROV 07/19/15 -- follow-up visit for history  of tobacco use and dyspnea with chronic cough. 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 At our last visit based on obstruction on his spirometry we started Symbicort twice a day. He feels that this has been beneficial, breathing is better. His voice has changed some with it, maybe some hoarseness. He is having some cough, a lot of congestion. He recalls being on flonase before, can't recall how helpful it was.                  ROV 10/10/15 -- Patient returns today for acute follow-up after he developed upper airway and tongue irritation, swelling on Symbicort. He saw sores on tongue as well. He has a history of tobacco use, dyspnea, chronic cough. He also has a history of suspected aspiration with removal of vegetable foreign body material by bronchoscopy in the past.  Of note he is on lisinopril.   He also has throat irritation, globus sensation  Not really sure that stopping symbicort has helped much.  Past Medical History:  Diagnosis Date  . ADD (attention deficit disorder)   . Anxiety    Panic  . Bipolar disorder (HCC)   . Depression   . GERD (gastroesophageal reflux disease)    ? they thought GERD caused cough.   . Mental disorder    ADD, bipolar  . Pneumonia 04/12/2013  . Shortness of breath   . Substance abuse      Review of Systems As per HPI     Objective:   Physical Exam Vitals:   10/10/15 1059  BP: 134/88  Pulse: (!) 56  SpO2: 96%  Weight: 261 lb (118.4 kg)  Height: 5\' 10"  (1.778 m)   Gen: Pleasant, well-nourished, in no distress,  normal affect  ENT: No lesions,  mouth clear,  oropharynx clear, no postnasal drip  Neck: No JVD, no TMG, no carotid bruits  Lungs: No use of accessory muscles, no  wheezing or crackles  Cardiovascular: RRR, heart sounds normal, no murmur or gallops, no edema  Musculoskeletal: No deformities, no cyanosis or clubbing  Neuro: alert, non focal  Skin: Warm, no lesions or rashes      Assessment & Plan:  Tongue irritation He hasn't ulcerated lesion on his tongue that appears to be healing. He was treated with nystatin. Simbicort stopped. Is not clear to me that this was angioedema but he does have throat clearing and cough. At this point I believe we should stop his ACE inhibitor and I will substitute an ARB. He will discuss this with Dr. Clelia Croft. I think it's okay for him to restart the Symbicort but we will watch him closely to insure he doesn't get throat swelling. He will rinse his mouth after use. I'll follow him in 3 months  COPD (chronic obstructive pulmonary disease) (HCC) Clearly misses the Symbicort from a breathing standpoint. I will try to restart. If he develops upper airway side effects and this will have to be stopped immediately and we will defer an alternative.  [name], MD, PhD 10/10/2015, 11:14 AM [facility] [phone] or if no answer [phone]

## 2015-10-10 NOTE — Patient Instructions (Signed)
We will stop lisinopril Start valsartan 80mg  daily as a substitute for the lisinopril. If tolerated we will speak with Dr Clelia CroftShaw about continuing.  You may restart symbicort twice a day Take albuterol 2 puffs up to every 4 hours if needed for shortness of breath.  Follow with Dr Delton CoombesByrum in 3 months or sooner if you have any problems

## 2015-10-10 NOTE — Assessment & Plan Note (Signed)
He hasn't ulcerated lesion on his tongue that appears to be healing. He was treated with nystatin. Simbicort stopped. Is not clear to me that this was angioedema but he does have throat clearing and cough. At this point I believe we should stop his ACE inhibitor and I will substitute an ARB. He will discuss this with Dr. Clelia CroftShaw. I think it's okay for him to restart the Symbicort but we will watch him closely to insure he doesn't get throat swelling. He will rinse his mouth after use. I'll follow him in 3 months

## 2015-12-02 ENCOUNTER — Telehealth: Payer: Self-pay | Admitting: Emergency Medicine

## 2015-12-02 MED ORDER — PREDNISONE 10 MG PO TABS: ORAL_TABLET | ORAL | 0 refills | Status: DC

## 2015-12-02 MED ORDER — DOXYCYCLINE HYCLATE 100 MG PO TABS: 100.0000 mg | ORAL_TABLET | Freq: Two times a day (BID) | ORAL | 0 refills | Status: DC

## 2015-12-02 MED ORDER — BENZONATATE 100 MG PO CAPS: 100.0000 mg | ORAL_CAPSULE | Freq: Three times a day (TID) | ORAL | 1 refills | Status: DC | PRN

## 2015-12-02 NOTE — Telephone Encounter (Signed)
Patient coughing up green mucus, chest tightness, wheezing.  Does not have anymore tessalon Perles. Sinus drainage draining down throat, chest feels full of congestion.    [facility]  No Known Allergies

## 2015-12-02 NOTE — Telephone Encounter (Signed)
Please treat with pred taper > Take 40mg  daily for 3 days, then 30mg  daily for 3 days, then 20mg  daily for 3 days, then 10mg  daily for 3 days, then stop Doxycycline 100mg  bid x 7 days OK to refill tessalon perles

## 2015-12-02 NOTE — Telephone Encounter (Signed)
Patient aware of Dr. Kavin LeechByrum's recommendations. Rx sent to pharmacy. Nothing further needed.

## 2016-01-10 ENCOUNTER — Other Ambulatory Visit: Payer: Self-pay | Admitting: Emergency Medicine

## 2016-01-15 ENCOUNTER — Ambulatory Visit: Payer: BLUE CROSS/BLUE SHIELD | Admitting: Emergency Medicine

## 2016-01-31 ENCOUNTER — Other Ambulatory Visit: Payer: Self-pay | Admitting: Emergency Medicine

## 2016-02-21 ENCOUNTER — Encounter: Payer: Self-pay | Admitting: Emergency Medicine

## 2016-02-21 ENCOUNTER — Ambulatory Visit (INDEPENDENT_AMBULATORY_CARE_PROVIDER_SITE_OTHER): Payer: BLUE CROSS/BLUE SHIELD | Admitting: Emergency Medicine

## 2016-02-21 VITALS — BP 142/98 | HR 52 | Ht 70.0 in | Wt 263.0 lb

## 2016-02-21 DIAGNOSIS — J449 Chronic obstructive pulmonary disease, unspecified: Secondary | ICD-10-CM

## 2016-02-21 NOTE — Assessment & Plan Note (Signed)
We will try loratadine 10mg  daily to see if this helps with congestion

## 2016-02-21 NOTE — Assessment & Plan Note (Signed)
  We will temporarily stop Symbicort Start Anoro 1 puff once a day. Continue to rinse your mouth after using.  Please call us in a month to let us know how you have done with the new inhaler. If you benefit then we will send a prescription for you.  Follow with Dr Delton CoombesByrum in 6 months or sooner if you have any problems

## 2016-02-21 NOTE — Patient Instructions (Addendum)
We will try loratadine 10mg  daily to see if this helps with congestion We will temporarily stop Symbicort Start Anoro 1 puff once a day. Continue to rinse your mouth after using.  Please call us in a month to let us know how you have done with the new inhaler. If you benefit then we will send a prescription for you.  Follow with Dr Delton CoombesByrum in 6 months or sooner if you have any problems

## 2016-02-21 NOTE — Progress Notes (Signed)
Subjective:    Patient ID: [name], male    DOB: Sep 19, 1955, 61 y.o.   MRN: [id number]  HPI  02/21/2016  Chief Complaint  Patient presents with  . Follow-up    Follows for: COPD, pt is wheezing, even with inhaler use, and producing yellow to white mucus.    61 year old man former smoker, history of bipolar disorder and prior substance abuse. Has been evaluated for cough and for  Abnormal chest imaging. He underwent bronchoscopy with removal of a foreign body from the right lower lobe bronchus as mentioned above. He had grossly normal spirometry 06/12/13. 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  He reports today that he is having exertional SOB, especially with stairs, can make him panic. He has some wheezing, some cough that progresses thru the day. He has a repeat Ct scan chest and neck due for 01/29/15 ordered by Dr Tyrone SageGerhardt.    ROV 05/23/15 -- follow-up visit for patient with a history of tobacco use, bipolar disease (last ECT was 3 weeks ago) and prior substance abuse who we have followed for COPD and cough. He also has a history of severe bronchoscopy and removal of foreign body material (vegetable matter) in 09/2013, and an elevated left hemidiaphragm. His most recent chest x-ray was 05/06/15 in this office which I have personally reviewed. This showed bibasilar subsegmental atelectasis.  He was recently given pred for an AE-COPD, but did not take it because he saw Dr Manus GunningEhinger and he had ECG changes > was referred [facility] (where [facility] gets his [facility]).   He is coughing every day, has been worse lately. He rarely uses albuterol, has not seen much benefit. He feel dyspnea and this is his most bothersome sx. Happens with stairs, exertion. He underwent pulmonary function testing today that I have personally reviewed. His spirometry shows mixed obstruction and restriction with a moderately severe decrease in his FEV1 and a significant improvement following bronchodilator. His lung  volumes confirm restriction.   ROV 07/19/15 -- follow-up visit for history of tobacco use and dyspnea with chronic cough. 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 At our last visit based on obstruction on his spirometry we started Symbicort twice a day. He feels that this has been beneficial, breathing is better. His voice has changed some with it, maybe some hoarseness. He is having some cough, a lot of congestion. He recalls being on flonase before, can't recall how helpful it was.                  ROV 10/10/15 -- Patient returns today for acute follow-up after he developed upper airway and tongue irritation, swelling on Symbicort. He saw sores on tongue as well. He has a history of tobacco use, dyspnea, chronic cough. He also has a history of suspected aspiration with removal of vegetable foreign body material by bronchoscopy in the past.  Of note he is on lisinopril.   He also has throat irritation, globus sensation  Not really sure that stopping symbicort has helped much.  ROV 02/21/16 -- this follow-up visit for COPD, chronic cough, ulcerated lesion on his tongue for which he was temporarily off Symbicort. We restarted this in August. He was treated for acute exacerbation of COPD with antibiotics and prednisone in October 2017.  He is back on Symbicort and is tolerating it. He feels that his breathing is probably not as good  as it was when I saw him last time. He does have some occasional cough.                                                                                Review of Systems As per HPI     Objective:   Physical Exam Vitals:   02/21/16 1022  BP: (!) 142/98  Pulse: (!) 52  SpO2: 96%  Weight: 263 lb (119.3 kg)  Height: 5\' 10"  (1.778 m)   Gen: Pleasant, well-nourished, in no distress,  normal affect  ENT: No lesions,  mouth clear,  oropharynx clear, no postnasal drip  Neck: No JVD, no TMG, no  carotid bruits  Lungs: No use of accessory muscles, no wheezing or crackles  Cardiovascular: RRR, heart sounds normal, no murmur or gallops, no edema  Musculoskeletal: No deformities, no cyanosis or clubbing  Neuro: alert, non focal  Skin: Warm, no lesions or rashes      Assessment & Plan:  COPD (chronic obstructive pulmonary disease) (HCC)  We will temporarily stop Symbicort Start Anoro 1 puff once a day. Continue to rinse your mouth after using.  Please call us in a month to let us know how you have done with the new inhaler. If you benefit then we will send a prescription for you.  Follow with Dr Delton Coombes in 6 months or sooner if you have any problems  Chronic rhinitis We will try loratadine 10mg  daily to see if this helps with congestion  [name], MD, PhD 02/21/2016, 10:56 AM [facility] [phone] or if no answer [phone]

## 2016-03-16 ENCOUNTER — Telehealth: Payer: Self-pay | Admitting: Emergency Medicine

## 2016-03-16 MED ORDER — UMECLIDINIUM-VILANTEROL 62.5-25 MCG/INH IN AEPB: 1.0000 | INHALATION_SPRAY | Freq: Every day | RESPIRATORY_TRACT | 5 refills | Status: DC

## 2016-03-16 NOTE — Telephone Encounter (Signed)
lmtcb X1 for pt.  anoro rx [facility].

## 2016-03-17 NOTE — Telephone Encounter (Signed)
Patient returned call.  He states [facility] has called him to let him know they have received RX for Anoro and it should be ready at the pharmacy in the next few days (having it shipped there).  States no need to return his call unless need to speak with him about another issue.

## 2016-03-17 NOTE — Telephone Encounter (Signed)
Spoke with pt, who states [facility] had to order anoro and it will take a few days to come in. Pt states he had a few days left of his samples. I have advised pt to contact us if he runs out before his Rx arrives. Pt voiced his understanding and had no further questions. Nothing further needed.

## 2016-04-09 LAB — HEPATIC FUNCTION PANEL
ALT: 23 (ref 10–40)
AST: 20 (ref 14–40)
Alkaline Phosphatase: 83 (ref 25–125)
BILIRUBIN, TOTAL: 0.7

## 2016-04-09 LAB — CBC AND DIFFERENTIAL
HCT: 41 (ref 41–53)
HEMOGLOBIN: 13.5 (ref 13.5–17.5)
NEUTROS ABS: 4
PLATELETS: 239 (ref 150–399)
WBC: 7.2

## 2016-04-09 LAB — TSH: TSH: 0.62 (ref 0.41–5.90)

## 2016-04-09 LAB — BASIC METABOLIC PANEL
BUN: 15 (ref 4–21)
Creatinine: 0.9 (ref 0.6–1.3)
Glucose: 100
Potassium: 4.2 (ref 3.4–5.3)
Sodium: 139 (ref 137–147)

## 2016-04-09 LAB — HEMOGLOBIN A1C: Hemoglobin A1C: 5.2

## 2016-05-21 ENCOUNTER — Other Ambulatory Visit: Payer: Self-pay | Admitting: Emergency Medicine

## 2016-06-18 ENCOUNTER — Other Ambulatory Visit: Payer: Self-pay | Admitting: Emergency Medicine

## 2016-07-09 ENCOUNTER — Ambulatory Visit (INDEPENDENT_AMBULATORY_CARE_PROVIDER_SITE_OTHER): Payer: BLUE CROSS/BLUE SHIELD | Admitting: Family Medicine

## 2016-07-09 ENCOUNTER — Encounter: Payer: Self-pay | Admitting: Family Medicine

## 2016-07-09 VITALS — BP 118/82 | HR 72 | Temp 97.9°F | Ht 71.5 in | Wt 260.2 lb

## 2016-07-09 DIAGNOSIS — R609 Edema, unspecified: Secondary | ICD-10-CM

## 2016-07-09 DIAGNOSIS — Z86010 Personal history of colonic polyps: Secondary | ICD-10-CM | POA: Insufficient documentation

## 2016-07-09 DIAGNOSIS — F1021 Alcohol dependence, in remission: Secondary | ICD-10-CM | POA: Diagnosis not present

## 2016-07-09 DIAGNOSIS — E669 Obesity, unspecified: Secondary | ICD-10-CM | POA: Diagnosis not present

## 2016-07-09 DIAGNOSIS — I10 Essential (primary) hypertension: Secondary | ICD-10-CM

## 2016-07-09 DIAGNOSIS — R739 Hyperglycemia, unspecified: Secondary | ICD-10-CM | POA: Diagnosis not present

## 2016-07-09 DIAGNOSIS — F3132 Bipolar disorder, current episode depressed, moderate: Secondary | ICD-10-CM

## 2016-07-09 DIAGNOSIS — M19049 Primary osteoarthritis, unspecified hand: Secondary | ICD-10-CM | POA: Diagnosis not present

## 2016-07-09 DIAGNOSIS — Z860101 Personal history of adenomatous and serrated colon polyps: Secondary | ICD-10-CM | POA: Insufficient documentation

## 2016-07-09 LAB — POC URINALSYSI DIPSTICK (AUTOMATED)
BILIRUBIN UA: NEGATIVE
Blood, UA: NEGATIVE
Glucose, UA: NEGATIVE
KETONES UA: NEGATIVE
LEUKOCYTES UA: NEGATIVE
Nitrite, UA: NEGATIVE
Protein, UA: NEGATIVE
Spec Grav, UA: 1.02 (ref 1.010–1.025)
Urobilinogen, UA: 0.2 E.U./dL
pH, UA: 6 (ref 5.0–8.0)

## 2016-07-09 LAB — COMPREHENSIVE METABOLIC PANEL
ALT: 28 U/L (ref 0–53)
AST: 21 U/L (ref 0–37)
Albumin: 4.6 g/dL (ref 3.5–5.2)
Alkaline Phosphatase: 91 U/L (ref 39–117)
BILIRUBIN TOTAL: 1.2 mg/dL (ref 0.2–1.2)
BUN: 13 mg/dL (ref 6–23)
CALCIUM: 9.2 mg/dL (ref 8.4–10.5)
CO2: 28 meq/L (ref 19–32)
CREATININE: 0.91 mg/dL (ref 0.40–1.50)
Chloride: 95 mEq/L — ABNORMAL LOW (ref 96–112)
GFR: 89.43 mL/min (ref >60.00)
GLUCOSE: 95 mg/dL (ref 70–99)
Potassium: 4.9 mEq/L (ref 3.5–5.1)
Sodium: 127 mEq/L — ABNORMAL LOW (ref 135–145)
Total Protein: 6.6 g/dL (ref 6.0–8.3)

## 2016-07-09 LAB — TSH: TSH: 0.99 u[IU]/mL (ref 0.35–4.50)

## 2016-07-09 LAB — HEMOGLOBIN A1C: Hgb A1c MFr Bld: 5.3 % (ref 4.6–6.5)

## 2016-07-09 LAB — CBC
HCT: 42.3 % (ref 39.0–52.0)
Hemoglobin: 14.1 g/dL (ref 13.0–17.0)
MCHC: 33.4 g/dL (ref 30.0–36.0)
MCV: 81.9 fl (ref 78.0–100.0)
PLATELETS: 255 10*3/uL (ref 150.0–400.0)
RBC: 5.16 Mil/uL (ref 4.22–5.81)
RDW: 13.4 % (ref 11.5–15.5)
WBC: 7 10*3/uL (ref 4.0–10.5)

## 2016-07-09 MED ORDER — VALSARTAN 80 MG PO TABS: 80.0000 mg | ORAL_TABLET | Freq: Every day | ORAL | 3 refills | Status: DC

## 2016-07-09 NOTE — Assessment & Plan Note (Signed)
Sees Dr. Carver FilaBook. Also used opiates, ritalin, amphetamines in the past. Luckily on recent use.

## 2016-07-09 NOTE — Assessment & Plan Note (Signed)
Depression major issue. History of ECT.Sometimes mild hypomania symptoms. Prozac, gabapentin for stress/anxiety as well as vistaril. Under care of Dr. Carver FilaBook

## 2016-07-09 NOTE — Patient Instructions (Addendum)
Sign release of information at the check out desk for last colonoscopy [facility]  Please stop by lab before you go  Cutting down on breads and sweets   Trial compression stockings- think they can help with your swelling

## 2016-07-09 NOTE — Progress Notes (Signed)
Phone: [phone]  Subjective:  Patient presents today to establish care.  Prior patient of Dr. Clelia Croft with [facility]. Sees Dr. Delton Coombes for pulmonology. Chief complaint-noted.   See problem oriented charting  The following were reviewed and entered/updated in epic: Past Medical History:  Diagnosis Date  . ADD (attention deficit disorder)   . Anxiety    Panic  . Bipolar disorder (HCC)   . GERD (gastroesophageal reflux disease)    ? they thought GERD caused cough.   . Mental disorder    ADD, bipolar  . Pneumonia 04/12/2013  . Shortness of breath   . Substance abuse    Patient Active Problem List   Diagnosis Date Noted  . COPD (chronic obstructive pulmonary disease) (HCC) 02/27/2015    Priority: High  . Alcohol dependence (HCC) 07/29/2011    Priority: High  . Hypertension 07/09/2016    Priority: Medium  . History of adenomatous polyp of colon 07/09/2016    Priority: Medium  . Hand arthritis 07/09/2016    Priority: Medium  . Chronic maxillary sinusitis 09/27/2013    Priority: Medium  . Bipolar affective disorder, depressed, moderate (HCC) 07/29/2011    Priority: Medium    Class: Chronic  . Dyspnea on exertion 01/18/2015    Priority: Low  . Lung mass 10/10/2013    Priority: Low  . GERD (gastroesophageal reflux disease) 09/27/2013    Priority: Low  . Chronic cough 06/21/2013    Priority: Low  . Obesity, Class II, BMI 35-39.9 07/09/2016  . Edema 07/09/2016   Past Surgical History:  Procedure Laterality Date  . APPENDECTOMY  1988  . COLONOSCOPY    . TONSILLECTOMY    . VIDEO BRONCHOSCOPY Bilateral 10/10/2013   Procedure: VIDEO BRONCHOSCOPY WITH FLUORO;  Surgeon: Oretha Milch, MD;  Location: [facility] ENDOSCOPY;  Service: Cardiopulmonary;  Laterality: Bilateral;  . VIDEO BRONCHOSCOPY N/A 10/13/2013   Procedure: VIDEO BRONCHOSCOPY, REMOVAL OF ENDOBRONCHIAL LESION, PROBABLE FOREIGN BODY;  Surgeon: Delight Ovens, MD;  Location: [facility] OR;  Service: Thoracic;  Laterality: N/A;  .  VIDEO BRONCHOSCOPY N/A 11/28/2013   Procedure: VIDEO BRONCHOSCOPY;  Surgeon: Delight Ovens, MD;  Location[facility] OR;  Service: Thoracic;  Laterality: N/A;  bronchoscopy for removal of foreign body    Family History  Problem Relation Age of Onset  . Breast cancer Mother   . Alcohol abuse Father        unclear cause of death. 37  . Colon cancer Father   . Alcohol abuse Sister        substance as well  . Alcohol abuse Brother        substance abuse  . Alcohol abuse Sister        substance abuse    Medications- reviewed and updated Current Outpatient Prescriptions  Medication Sig Dispense Refill  . FLUoxetine (PROZAC) 20 MG tablet Take 60 mg by mouth daily.    Marland Kitchen gabapentin (NEURONTIN) 600 MG tablet Take 600 mg by mouth 3 (three) times daily.    Marland Kitchen gabapentin (NEURONTIN) 800 MG tablet Take 1 tablet by mouth 4 (four) times daily.  2  . hydrOXYzine (VISTARIL) 25 MG capsule Take 1 capsule by mouth 4 (four) times daily.  5  . ibuprofen (ADVIL,MOTRIN) 200 MG tablet Take 400-600 mg by mouth daily as needed for headache.     . Melatonin 5 MG LOZG Place 5 mg under the tongue at bedtime.    . traZODone (DESYREL) 100 MG tablet Take 50 mg by mouth at bedtime.     Marland Kitchen  umeclidinium-vilanterol (ANORO ELLIPTA) 62.5-25 MCG/INH AEPB Inhale 1 puff into the lungs daily. 60 each 5  . valsartan (DIOVAN) 80 MG tablet TAKE 1 TABLET ONCE DAILY. 30 tablet 0   Allergies-reviewed and updated No Known Allergies  Social History   Social History  . Marital status: Married    Spouse name: N/A  . Number of children: N/A  . Years of education: N/A   Social History Main Topics  . Smoking status: Former Smoker    Packs/day: 1.00    Years: 20.00    Types: Cigarettes    Quit date: 02/16/1993  . Smokeless tobacco: Never Used  . Alcohol use No     Comment: none at all  . Drug use: No  . Sexual activity: Yes   Other Topics Concern  . None   Social History Narrative   Married. Lives with wife. 2 children.  Soon to be grandfather 07/2016.       Freelance work/semi retired. Editing/writing.       Hobbies: enjoys writing poetry    ROS--Full ROS was completed Review of Systems  Constitutional: Negative for chills, diaphoresis, fever, malaise/fatigue and weight loss.  HENT: Negative for ear discharge and ear pain.   Eyes: Negative for pain and discharge.  Respiratory: Negative for cough and shortness of breath.   Cardiovascular: Negative for chest pain and palpitations.  Gastrointestinal: Negative for abdominal pain and vomiting.  Genitourinary: Negative for dysuria and urgency.  Musculoskeletal: Positive for joint pain. Negative for falls.  Skin: Negative for itching and rash.  Neurological: Negative for tingling, tremors and weakness.  Endo/Heme/Allergies: Negative for polydipsia. Does not bruise/bleed easily.  Psychiatric/Behavioral: Positive for depression (controlled for most part). Negative for substance abuse (history but none recently) and suicidal ideas.   Objective: BP 118/82   Pulse 72   Temp 97.9 F (36.6 C) (Oral)   Ht 5' 11.5" (1.816 m)   Wt 260 lb 3.2 oz (118 kg)   SpO2 94%   BMI 35.78 kg/m  Gen: NAD, resting comfortably HEENT: Mucous membranes are moist. Oropharynx normal. TM normal. Eyes: sclera and lids normal, PERRLA Neck: no thyromegaly, no cervical lymphadenopathy CV: RRR no murmurs rubs or gallops Lungs: CTAB no crackles, wheeze, rhonchi Abdomen: soft/nontender/nondistended/normal bowel sounds. No rebound or guarding.  Ext: 1+ edema on right, 2+ on left Skin: warm, dry Neuro: 5/5 strength in upper and lower extremities, normal gait, normal reflexes  Assessment/Plan:  Hypertension S: controlled on valsartan 80mg .  ASCVD 10 year risk calculation if age 63-79: will be getting records which will enable us to calculate BP Readings from Last 3 Encounters:  07/09/16 118/82  02/21/16 (!) 142/98  10/10/15 134/88  A/P: We discussed blood pressure goal of  <140/90. Continue current medications- I refilled this for him- had been being sent in by pulmonary   Obesity, Class II, BMI 35-39.9 S: patient asks about weight loss. Current weight is in class II obesity. His exercise is limited by his depression- not motivated to get out in move unfortunately. His big issue with weight appears to be  Breads- crackers, peanut butter on sour dough bread, pita bread.  Sugar addiction- candy bars, cadburry A/P: I encouraged him to keep the above mentioned foods out of house if possible. He is worried about diabetes risk as has had some elevated cbgs in past Lab Results  Component Value Date   HGBA1C 5.3 07/09/2016  fortunately a1c is not elevated.   Edema S: Patient has had years of issues  with edema on L > R. He apparently had ultrasound years ago for DVT in rehab that was negative- hs not worsened since tha time. Present at least a few years.  A/P: patient is concerned about this- suspect venous insufficiency. Will get tsh, cbc, cmp. Also will trial compression hose as discussed venous insufficiency  Addendum: labs show hyponatremia - unclear cause of this- will need to review records when they come in. Some old labs from [facility] in chart and show this is a chronic issues. This could be cause of edema though. prozac could be cause of this but is needed for depression history.   Bipolar affective disorder, depressed, moderate (HCC) Depression major issue. History of ECT.Sometimes mild hypomania symptoms. Prozac, gabapentin for stress/anxiety as well as vistaril. Under care of Dr. Carver Fila  Alcohol dependence [facility]) Sees Dr. Carver Fila. Also used opiates, ritalin, amphetamines in the past. Luckily on recent use.   6 months at latest  Orders Placed This Encounter  Procedures  . CBC    [place]  . Comprehensive metabolic panel    [place]  . TSH    [place]  . Hemoglobin A1c    [place]  . POCT Urinalysis Dipstick (Automated)    Meds ordered this encounter    Medications  . gabapentin (NEURONTIN) 600 MG tablet    Sig: Take 600 mg by mouth 3 (three) times daily.  . valsartan (DIOVAN) 80 MG tablet    Sig: Take 1 tablet (80 mg total) by mouth daily.    Dispense:  90 tablet    Refill:  3    Return precautions advised.  Tana Conch, MD

## 2016-07-09 NOTE — Assessment & Plan Note (Signed)
S: Patient has had years of issues with edema on L > R. He apparently had ultrasound years ago for DVT in rehab that was negative- hs not worsened since tha time. Present at least a few years.  A/P: patient is concerned about this- suspect venous insufficiency. Will get tsh, cbc, cmp. Also will trial compression hose as discussed venous insufficiency  Addendum: labs show hyponatremia - unclear cause of this- will need to review records when they come in. Some old labs from [facility] in chart and show this is a chronic issues. This could be cause of edema though. prozac could be cause of this but is needed for depression history.

## 2016-07-09 NOTE — Assessment & Plan Note (Signed)
S: patient asks about weight loss. Current weight is in class II obesity. His exercise is limited by his depression- not motivated to get out in move unfortunately. His big issue with weight appears to be  Breads- crackers, peanut butter on sour dough bread, pita bread.  Sugar addiction- candy bars, cadburry A/P: I encouraged him to keep the above mentioned foods out of house if possible. He is worried about diabetes risk as has had some elevated cbgs in past Lab Results  Component Value Date   HGBA1C 5.3 07/09/2016  fortunately a1c is not elevated.

## 2016-07-09 NOTE — Assessment & Plan Note (Signed)
S: controlled on valsartan 80mg .  ASCVD 10 year risk calculation if age 63-79: will be getting records which will enable us to calculate BP Readings from Last 3 Encounters:  07/09/16 118/82  02/21/16 (!) 142/98  10/10/15 134/88  A/P: We discussed blood pressure goal of <140/90. Continue current medications- I refilled this for him- had been being sent in by pulmonary

## 2016-08-04 ENCOUNTER — Other Ambulatory Visit: Payer: Self-pay | Admitting: Emergency Medicine

## 2016-08-24 ENCOUNTER — Telehealth: Payer: Self-pay | Admitting: Emergency Medicine

## 2016-08-24 ENCOUNTER — Encounter: Payer: Self-pay | Admitting: Emergency Medicine

## 2016-08-24 ENCOUNTER — Ambulatory Visit (INDEPENDENT_AMBULATORY_CARE_PROVIDER_SITE_OTHER): Payer: BLUE CROSS/BLUE SHIELD | Admitting: Emergency Medicine

## 2016-08-24 DIAGNOSIS — R053 Chronic cough: Secondary | ICD-10-CM

## 2016-08-24 DIAGNOSIS — J449 Chronic obstructive pulmonary disease, unspecified: Secondary | ICD-10-CM

## 2016-08-24 DIAGNOSIS — R05 Cough: Secondary | ICD-10-CM | POA: Diagnosis not present

## 2016-08-24 MED ORDER — ALBUTEROL SULFATE HFA 108 (90 BASE) MCG/ACT IN AERS: 2.0000 | INHALATION_SPRAY | Freq: Four times a day (QID) | RESPIRATORY_TRACT | 2 refills | Status: DC | PRN

## 2016-08-24 NOTE — Addendum Note (Signed)
Addended by: Jaynee EaglesLEMONS, LINDSAY C on: 08/24/2016 02:22 PM   Modules accepted: Orders

## 2016-08-24 NOTE — Telephone Encounter (Signed)
Spoke with patient. He was seen by RB today and forgot to mention the sore in his mouth. He stated that the sore comes from his inhaler.   Pt uses [facility].   RB, please advise.

## 2016-08-24 NOTE — Telephone Encounter (Signed)
Left message for patient to call back  

## 2016-08-24 NOTE — Patient Instructions (Signed)
Please continue Anoro once a day We will give you a prescription for albuterol to use 2 puffs up to every 4 hours if needed for shortness of breath. Starting loratadine (Claritin) 10 mg daily for your congestion Follow with Dr Delton CoombesByrum in 6 months or sooner if you have any problems

## 2016-08-24 NOTE — Assessment & Plan Note (Signed)
At least some of this appears to relate to allergic rhinitis and congestion. We discussed starting loratadine today.

## 2016-08-24 NOTE — Telephone Encounter (Signed)
Ask him to try using saltwater gargles to see if this improves. Have him use these after he takes his inhaled medication. If he continues to have a sore in his mouth and he could consider stopping the Anoro temporarily to see if the lesion resolves.

## 2016-08-24 NOTE — Progress Notes (Signed)
Subjective:    Patient ID: [name], male    DOB: 1955/05/02, 61 y.o.   MRN: [id number]  HPI  08/24/2016  Chief Complaint  Patient presents with  . Follow-up    COPD, changed from Symbicort to Anoro, the heat and humity has been bothering his breathing, he is trying to exercise to lose weight    61 year old man former smoker, history of bipolar disorder and prior substance abuse. Has been evaluated for cough and for  Abnormal chest imaging. He underwent bronchoscopy with removal of a foreign body from the right lower lobe bronchus as mentioned above. He had grossly normal spirometry 06/12/13. 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  He reports today that he is having exertional SOB, especially with stairs, can make him panic. He has some wheezing, some cough that progresses thru the day. He has a repeat Ct scan chest and neck due for 01/29/15 ordered by Dr Tyrone Sage.    ROV 05/23/15 -- follow-up visit for patient with a history of tobacco use, bipolar disease (last ECT was 3 weeks ago) and prior substance abuse who we have followed for COPD and cough. He also has a history of severe bronchoscopy and removal of foreign body material (vegetable matter) in 09/2013, and an elevated left hemidiaphragm. His most recent chest x-ray was 05/06/15 in this office which I have personally reviewed. This showed bibasilar subsegmental atelectasis.  He was recently given pred for an AE-COPD, but did not take it because he saw Dr Manus Gunning and he had ECG changes > was referred to cardiology at [facility] (where he gets his ECT).   He is coughing every day, has been worse lately. He rarely uses albuterol, has not seen much benefit. He feel dyspnea and this is his most bothersome sx. Happens with stairs, exertion. He underwent pulmonary function testing today that I have personally reviewed. His spirometry shows mixed obstruction and restriction with a moderately severe decrease in his FEV1 and a significant  improvement following bronchodilator. His lung volumes confirm restriction.   ROV 07/19/15 -- follow-up visit for history of tobacco use and dyspnea with chronic cough. 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 At our last visit based on obstruction on his spirometry we started Symbicort twice a day. He feels that this has been beneficial, breathing is better. His voice has changed some with it, maybe some hoarseness. He is having some cough, a lot of congestion. He recalls being on flonase before, can't recall how helpful it was.                  ROV 10/10/15 -- Patient returns today for acute follow-up after he developed upper airway and tongue irritation, swelling on Symbicort. He saw sores on tongue as well. He has a history of tobacco use, dyspnea, chronic cough. He also has a history of suspected aspiration with removal of vegetable foreign body material by bronchoscopy in the past.  Of note he is on lisinopril.   He also has throat irritation, globus sensation  Not really sure that stopping symbicort has helped much.  ROV 02/21/16 -- this follow-up visit for COPD, chronic cough, ulcerated lesion on his tongue for which he was temporarily off Symbicort. We restarted this in August. He was treated for acute exacerbation of COPD with antibiotics and prednisone in October 2017.  He is back on Symbicort and is tolerating it. He feels that  his breathing is probably not as good as it was when I saw him last time. He does have some occasional cough.                            ROV 08/24/16 -- patient has a history of COPD and chronic cough. At his last visit we changed Symbicort to Anoro. Also started him on loratadine. He believes that the Anoro helps him, as good or better than the Symbicort. He reports that he has been having more trouble over the last few weeks during the increased heat. He is also dealing with depression. He  does not have an albuterol HFA to use prn. He does have daily cough is productive of clear mucous. He has not had any exacerbations, no prednisone.  Review of Systems As per HPI     Objective:   Physical Exam Vitals:   08/24/16 1402  BP: (!) 148/80  Pulse: 82  SpO2: 96%  Weight: 262 lb 6.4 oz (119 kg)  Height: 5\' 10"  (1.778 m)   Gen: Pleasant, well-nourished, in no distress,  normal affect  ENT: No lesions,  mouth clear,  oropharynx clear, no postnasal drip  Neck: No JVD, no TMG, no carotid bruits  Lungs: No use of accessory muscles, no wheezing or crackles  Cardiovascular: RRR, heart sounds normal, no murmur or gallops, no edema  Musculoskeletal: No deformities, no cyanosis or clubbing  Neuro: alert, non focal  Skin: Warm, no lesions or rashes      Assessment & Plan:  COPD (chronic obstructive pulmonary disease) (HCC) Please continue Anoro once a day We will give you a prescription for albuterol to use 2 puffs up to every 4 hours if needed for shortness of breath. Follow with Dr Delton CoombesByrum in 6 months or sooner if you have any problems   Chronic cough At least some of this appears to relate to allergic rhinitis and congestion. We discussed starting loratadine today.  Levy Pupaobert Everlee Quakenbush, MD, PhD 08/24/2016, 2:19 PM [facility] [phone] or if no answer [phone]

## 2016-08-24 NOTE — Assessment & Plan Note (Signed)
Please continue Anoro once a day We will give you a prescription for albuterol to use 2 puffs up to every 4 hours if needed for shortness of breath. Follow with Dr Delton CoombesByrum in 6 months or sooner if you have any problems

## 2016-08-25 ENCOUNTER — Encounter: Payer: Self-pay | Admitting: Family Medicine

## 2016-08-25 NOTE — Telephone Encounter (Signed)
Spoke with pt. He is aware of RB response. Nothing further was needed.

## 2016-09-01 ENCOUNTER — Encounter: Payer: Self-pay | Admitting: Family Medicine

## 2016-09-01 ENCOUNTER — Telehealth: Payer: Self-pay | Admitting: *Deleted

## 2016-09-01 ENCOUNTER — Ambulatory Visit (INDEPENDENT_AMBULATORY_CARE_PROVIDER_SITE_OTHER): Payer: BLUE CROSS/BLUE SHIELD | Admitting: Family Medicine

## 2016-09-01 DIAGNOSIS — I10 Essential (primary) hypertension: Secondary | ICD-10-CM | POA: Diagnosis not present

## 2016-09-01 NOTE — Telephone Encounter (Signed)
Patient came into office for bp check; bp elevated at 144/98; notified Dr. Durene CalHunter, PCP wants to see patient for OV; patient scheduled and waiting in office to be seen

## 2016-09-01 NOTE — Progress Notes (Signed)
Subjective:  [name] is a 61 y.o. year old very pleasant male patient who presents for/with See problem oriented charting ROS- No chest pain or shortness of breath. No headache or blurry vision. Admits to severe depression- working diligently with psychiatry   Past Medical History-  Patient Active Problem List   Diagnosis Date Noted  . COPD (chronic obstructive pulmonary disease) (HCC) 02/27/2015    Priority: High  . Alcohol dependence (HCC) 07/29/2011    Priority: High  . Hypertension 07/09/2016    Priority: Medium  . History of adenomatous polyp of colon 07/09/2016    Priority: Medium  . Hand arthritis 07/09/2016    Priority: Medium  . Chronic maxillary sinusitis 09/27/2013    Priority: Medium  . Bipolar affective disorder, depressed, moderate (HCC) 07/29/2011    Priority: Medium    Class: Chronic  . Obesity, Class II, BMI 35-39.9 07/09/2016    Priority: Low  . Edema 07/09/2016    Priority: Low  . Dyspnea on exertion 01/18/2015    Priority: Low  . Lung mass 10/10/2013    Priority: Low  . GERD (gastroesophageal reflux disease) 09/27/2013    Priority: Low  . Chronic cough 06/21/2013    Priority: Low    Medications- reviewed and updated Current Outpatient Prescriptions  Medication Sig Dispense Refill  . albuterol (PROVENTIL HFA;VENTOLIN HFA) 108 (90 Base) MCG/ACT inhaler Inhale 2 puffs into the lungs every 6 (six) hours as needed for wheezing or shortness of breath. 1 Inhaler 2  . ARIPiprazole (ABILIFY) 10 MG tablet Take 10 mg by mouth daily.    Marland Kitchen. FLUoxetine (PROZAC) 20 MG tablet Take 60 mg by mouth daily.    Marland Kitchen. gabapentin (NEURONTIN) 600 MG tablet Take 600 mg by mouth 3 (three) times daily.    Marland Kitchen. gabapentin (NEURONTIN) 800 MG tablet Take 1 tablet by mouth 4 (four) times daily.  2  . hydrOXYzine (VISTARIL) 25 MG capsule Take 1 capsule by mouth 4 (four) times daily.  5  . ibuprofen (ADVIL,MOTRIN) 200 MG tablet Take 400-600 mg by mouth daily as needed for headache.     .  Melatonin 5 MG LOZG Place 5 mg under the tongue at bedtime.    . naproxen (NAPROSYN) 500 MG tablet Take 500 mg by mouth 3 (three) times daily with meals.    . traZODone (DESYREL) 100 MG tablet Take 50 mg by mouth at bedtime.     Marland Kitchen. umeclidinium-vilanterol (ANORO ELLIPTA) 62.5-25 MCG/INH AEPB Inhale 1 puff into the lungs daily. 60 each 5  . valsartan (DIOVAN) 80 MG tablet Take 1 tablet (80 mg total) by mouth daily. 90 tablet 3   No current facility-administered medications for this visit.     Objective: BP (!) 142/98--> BP improved on recheck though as noted below  (BP Location: Left Arm, Patient Position: Sitting, Cuff Size: Large)   Pulse 88   Temp 97.9 F (36.6 C) (Oral)   Resp 18   Wt 258 lb 3.2 oz (117.1 kg)   SpO2 97%   BMI 37.05 kg/m  Gen: NAD, resting comfortably CV: RRR no murmurs rubs or gallops Lungs: CTAB no crackles, wheeze, rhonchi Abdomen: soft/nontender/nondistended/normal bowel sounds. No rebound or guarding.  Ext: some nonpitting edema noted Skin: warm, dry  Assessment/Plan:  Hypertension S: controlled on valsartan 80mg  at last vist here in may. We reviewed last 6 readings though and 4/6 had at least one elevation on systolic or diastolic. He feels well overall right now except for severe depression.  Has rare dizziness if stands quickly.   He had Transcranial magnetic stimulation for depression today and after this BP noted 158/108. He admits that the experience was quite painful to him. He was advised to go to ER or primary care- we were called and advised visit here.    BP Readings from Last 3 Encounters:  09/01/16 (!) 136/94  08/24/16 (!) 148/80  07/09/16 118/82  A/P: We discussed blood pressure goal of <140/90 and that overall trend suspects that he is running high normal or slightly high for most part. We opted to increase valsartan to 160mg  with follow up in 1 week (he will double up his 80mg  tablets). May further go to 320 mg potentially vs add another  agent such as hctz or amlodipine.   1 week  Return precautions advised.  Tana Conch, MD

## 2016-09-01 NOTE — Assessment & Plan Note (Signed)
S: controlled on valsartan 80mg  at last vist here in may. We reviewed last 6 readings though and 4/6 had at least one elevation on systolic or diastolic. He feels well overall right now except for severe depression. Has rare dizziness if stands quickly.   He had Transcranial magnetic stimulation for depression today and after this BP noted 158/108. He admits that the experience was quite painful to him. He was advised to go to ER or primary care- we were called and advised visit here.    BP Readings from Last 3 Encounters:  09/01/16 (!) 136/94  08/24/16 (!) 148/80  07/09/16 118/82  A/P: We discussed blood pressure goal of <140/90 and that overall trend suspects that he is running high normal or slightly high for most part. We opted to increase valsartan to 160mg  with follow up in 1 week (he will double up his 80mg  tablets). May further go to 320 mg potentially vs add another agent such as hctz or amlodipine.

## 2016-09-01 NOTE — Telephone Encounter (Signed)
Patient called in to office to report that he just completed TMS procedure for depression; patient states his BP is 158/108; psych office requesting patient contact PCP prior to leaving their office. Patient reports his BP is elevated after each TMS treatment; patient reports slight/dull HA post treatment, but no HA prior to treatment. Patient denies chest pain or trouble breathing; patient does report some slight dizziness, but states he has dizziness frequently over past month. Patient does report that he feels extremely depressed and is going through a really tough time. Patients voice/tone seems anxious and patient states he is anxious at times. Reviewed with Dr. Durene CalHunter, PCP request patient come by for BP check; notified patient who states he will try to get to clinic today; if not he will come by on 09/02/16; educated patient if any new symptoms occur to return call to office or if patient has chest pain, sob to seek urgent medical care; patient voiced understanding.

## 2016-09-01 NOTE — Patient Instructions (Signed)
Take 2 valsartan 80mg  tablets each evening.   Schedule follow up for about a week.   Hopeful this is enough to control blood pressure but possible will need another agent.

## 2016-09-05 ENCOUNTER — Other Ambulatory Visit: Payer: Self-pay | Admitting: Family Medicine

## 2016-09-05 MED ORDER — LOSARTAN POTASSIUM 100 MG PO TABS: 100.0000 mg | ORAL_TABLET | Freq: Every day | ORAL | 3 refills | Status: DC

## 2016-09-05 NOTE — Progress Notes (Signed)
Recall valsartan so will change to losartan 100mg  (may be ok with 50mg )

## 2016-09-09 ENCOUNTER — Encounter: Payer: Self-pay | Admitting: Family Medicine

## 2016-09-09 ENCOUNTER — Ambulatory Visit (INDEPENDENT_AMBULATORY_CARE_PROVIDER_SITE_OTHER): Payer: BLUE CROSS/BLUE SHIELD | Admitting: Family Medicine

## 2016-09-09 VITALS — BP 120/78 | HR 69 | Temp 98.0°F | Ht 70.0 in | Wt 256.0 lb

## 2016-09-09 DIAGNOSIS — I10 Essential (primary) hypertension: Secondary | ICD-10-CM | POA: Diagnosis not present

## 2016-09-09 DIAGNOSIS — F3132 Bipolar disorder, current episode depressed, moderate: Secondary | ICD-10-CM

## 2016-09-09 NOTE — Progress Notes (Signed)
Subjective:  Zachary BarterJohn B [name] is a 61 y.o. year old very pleasant male patient who presents for/with See problem oriented charting ROS- severe depression- slightly improved. No chest pain. No edema.    Past Medical History-  Patient Active Problem List   Diagnosis Date Noted  . COPD (chronic obstructive pulmonary disease) (HCC) 02/27/2015    Priority: High  . Alcohol dependence (HCC) 07/29/2011    Priority: High  . Hypertension 07/09/2016    Priority: Medium  . History of adenomatous polyp of colon 07/09/2016    Priority: Medium  . Hand arthritis 07/09/2016    Priority: Medium  . Chronic maxillary sinusitis 09/27/2013    Priority: Medium  . Bipolar affective disorder, depressed, moderate (HCC) 07/29/2011    Priority: Medium    Class: Chronic  . Obesity, Class II, BMI 35-39.9 07/09/2016    Priority: Low  . Edema 07/09/2016    Priority: Low  . Dyspnea on exertion 01/18/2015    Priority: Low  . Lung mass 10/10/2013    Priority: Low  . GERD (gastroesophageal reflux disease) 09/27/2013    Priority: Low  . Chronic cough 06/21/2013    Priority: Low    Medications- reviewed and updated Current Outpatient Prescriptions  Medication Sig Dispense Refill  . albuterol (PROVENTIL HFA;VENTOLIN HFA) 108 (90 Base) MCG/ACT inhaler Inhale 2 puffs into the lungs every 6 (six) hours as needed for wheezing or shortness of breath. 1 Inhaler 2  . ARIPiprazole (ABILIFY) 10 MG tablet Take 10 mg by mouth daily.    Marland Kitchen. FLUoxetine (PROZAC) 20 MG tablet Take 60 mg by mouth daily.    Marland Kitchen. gabapentin (NEURONTIN) 800 MG tablet Take 1 tablet by mouth 4 (four) times daily.  2  . hydrOXYzine (VISTARIL) 25 MG capsule Take 1 capsule by mouth 4 (four) times daily.  5  . ibuprofen (ADVIL,MOTRIN) 200 MG tablet Take 400-600 mg by mouth daily as needed for headache.     . losartan (COZAAR) 100 MG tablet Take 1 tablet (100 mg total) by mouth daily. 90 tablet 3  . Melatonin 5 MG LOZG Place 5 mg under the tongue at bedtime.     . naproxen (NAPROSYN) 500 MG tablet Take 500 mg by mouth 3 (three) times daily with meals.    . traZODone (DESYREL) 100 MG tablet Take 50 mg by mouth at bedtime.     Marland Kitchen. umeclidinium-vilanterol (ANORO ELLIPTA) 62.5-25 MCG/INH AEPB Inhale 1 puff into the lungs daily. 60 each 5   No current facility-administered medications for this visit.     Objective: BP 120/78 (BP Location: Left Arm, Patient Position: Sitting, Cuff Size: Large)   Pulse 69   Temp 98 F (36.7 C) (Oral)   Ht 5\' 10"  (1.778 m)   Wt 256 lb (116.1 kg)   SpO2 95%   BMI 36.73 kg/m  Gen: NAD, resting comfortably CV: RRR  Lungs: nonlabored breathing Ext: no visible edema Skin: warm, dry, no visible rash Psych: depressed mood  Assessment/Plan:  Hypertension S: controlled on losartan 100mg . Had to change from valsartan due to manufacturing issue/recall.  BP Readings from Last 3 Encounters:  09/09/16 120/78  09/01/16 (!) 136/94  08/24/16 (!) 148/80  A/P: We discussed blood pressure goal of <140/90. Continue current meds  Bipolar affective disorder, depressed, moderate (HCC) S: severe depression undergoing transcranial magnetic stimulation (TMS) with neuropsychiatry. They keep being concerned about pressures before and after TMS- they are using automatic cuff and not manual readings A/P: excellent pressure today- discussed if  it is preventing treatments with higher BP we could push slightly lower as no orthostatic symptoms- such as amlodipine 2.5mg  or hctz 12.5mg - he will call us if he needs this adjustment- right now the most important thing is improvement in severe depression so want to work to help make that happen   Has November visit- happy to see sooner if needed.   Return precautions advised.  Tana ConchStephen Dally Oshel, MD

## 2016-09-09 NOTE — Assessment & Plan Note (Signed)
S: controlled on losartan 100mg . Had to change from valsartan due to manufacturing issue/recall.  BP Readings from Last 3 Encounters:  09/09/16 120/78  09/01/16 (!) 136/94  08/24/16 (!) 148/80  A/P: We discussed blood pressure goal of <140/90. Continue current meds

## 2016-09-09 NOTE — Assessment & Plan Note (Signed)
S: severe depression undergoing transcranial magnetic stimulation (TMS) with neuropsychiatry. They keep being concerned about pressures before and after TMS- they are using automatic cuff and not manual readings A/P: excellent pressure today- discussed if it is preventing treatments with higher BP we could push slightly lower as no orthostatic symptoms- such as amlodipine 2.5mg  or hctz 12.5mg - [name] will call us if [name] needs this adjustment- right now the most important thing is improvement in severe depression so want to work to help make that happen

## 2016-09-09 NOTE — Patient Instructions (Signed)
Continue current losartan 100mg . Blood pressure looks fantastic. Sorry for the hiccups with TMS and blood pressures there. If we really need to could add another medicine just to help with their readings but honestly I don't think we need to add medication based on how good your #s look todya

## 2016-09-25 ENCOUNTER — Telehealth: Payer: Self-pay

## 2016-09-25 MED ORDER — HYDROCHLOROTHIAZIDE 50 MG PO TABS: 50.0000 mg | ORAL_TABLET | Freq: Every day | ORAL | 5 refills | Status: DC

## 2016-09-25 NOTE — Telephone Encounter (Signed)
Patient called to report that he has been getting TMS treatments daily for 3 weeks, he has 3 more weeks left of daily therapy. They have been getting very high BP readings and are concerned that they may not be able to continue treatments if his BP is not controlled. Today it was 158/101. Pt also c/o headache for 3 days and continued LE edema and fatigue. He states that he does take the Losartan nightly. He would like to know what you recommend.  Dr. Durene CalHunter - Please advise. Thanks!

## 2016-09-25 NOTE — Telephone Encounter (Signed)
Please send in hydrochlorothiazide 25mg  #30 with 5 refills for him to take daily in addition to losartan. Losartan can be in night but would recommend hydrochlorothiazide in AM. Advise repeat BP check in office in 2 weeks with me.

## 2016-09-25 NOTE — Telephone Encounter (Signed)
Spoke with pt and gave recommendations. He will try medication and see how it does. Advised him to contact office if he notices BP drops or has any other issues with medication. Rx sent to pharmacy. Appt scheduled for follow up. Nothing further needed at this time.

## 2016-10-07 ENCOUNTER — Other Ambulatory Visit: Payer: Self-pay | Admitting: Emergency Medicine

## 2016-10-13 ENCOUNTER — Ambulatory Visit (INDEPENDENT_AMBULATORY_CARE_PROVIDER_SITE_OTHER): Payer: BLUE CROSS/BLUE SHIELD | Admitting: Family Medicine

## 2016-10-13 ENCOUNTER — Encounter: Payer: Self-pay | Admitting: Family Medicine

## 2016-10-13 VITALS — BP 130/82 | HR 68 | Temp 97.6°F | Wt 264.0 lb

## 2016-10-13 DIAGNOSIS — Z23 Encounter for immunization: Secondary | ICD-10-CM

## 2016-10-13 DIAGNOSIS — I10 Essential (primary) hypertension: Secondary | ICD-10-CM

## 2016-10-13 DIAGNOSIS — E871 Hypo-osmolality and hyponatremia: Secondary | ICD-10-CM

## 2016-10-13 LAB — BASIC METABOLIC PANEL
BUN: 13 mg/dL (ref 6–23)
CHLORIDE: 92 meq/L — AB (ref 96–112)
CO2: 32 meq/L (ref 19–32)
CREATININE: 0.85 mg/dL (ref 0.40–1.50)
Calcium: 9.4 mg/dL (ref 8.4–10.5)
GFR: 96.67 mL/min (ref >60.00)
Glucose, Bld: 97 mg/dL (ref 70–99)
Potassium: 4.5 mEq/L (ref 3.5–5.1)
Sodium: 127 mEq/L — ABNORMAL LOW (ref 135–145)

## 2016-10-13 MED ORDER — HYDROCHLOROTHIAZIDE 25 MG PO TABS: 25.0000 mg | ORAL_TABLET | Freq: Every day | ORAL | 3 refills | Status: DC

## 2016-10-13 NOTE — Progress Notes (Signed)
Subjective:  [name] is a 61 y.o. year old very pleasant male patient who presents for/with See problem oriented charting ROS- improved depression- following with psychiatry. No chest pain or shortness of breath. No headache or blurry vision.    Past Medical History-  Patient Active Problem List   Diagnosis Date Noted  . COPD (chronic obstructive pulmonary disease) (HCC) 02/27/2015    Priority: High  . Alcohol dependence (HCC) 07/29/2011    Priority: High  . Hypertension 07/09/2016    Priority: Medium  . History of adenomatous polyp of colon 07/09/2016    Priority: Medium  . Hand arthritis 07/09/2016    Priority: Medium  . Chronic maxillary sinusitis 09/27/2013    Priority: Medium  . Bipolar affective disorder, depressed, moderate (HCC) 07/29/2011    Priority: Medium    Class: Chronic  . Obesity, Class II, BMI 35-39.9 07/09/2016    Priority: Low  . Edema 07/09/2016    Priority: Low  . Dyspnea on exertion 01/18/2015    Priority: Low  . Lung mass 10/10/2013    Priority: Low  . GERD (gastroesophageal reflux disease) 09/27/2013    Priority: Low  . Chronic cough 06/21/2013    Priority: Low  . Hyponatremia 10/13/2016    Medications- reviewed and updated Current Outpatient Prescriptions  Medication Sig Dispense Refill  . albuterol (PROVENTIL HFA;VENTOLIN HFA) 108 (90 Base) MCG/ACT inhaler Inhale 2 puffs into the lungs every 6 (six) hours as needed for wheezing or shortness of breath. 1 Inhaler 2  . ANORO ELLIPTA 62.5-25 MCG/INH AEPB USE 1 PUFF DAILY AS DIRECTED 60 each 5  . ARIPiprazole (ABILIFY) 10 MG tablet Take 10 mg by mouth daily.    Marland Kitchen FLUoxetine (PROZAC) 20 MG tablet Take 60 mg by mouth daily.    Marland Kitchen gabapentin (NEURONTIN) 800 MG tablet Take 1 tablet by mouth 4 (four) times daily.  2  . hydrochlorothiazide (HYDRODIURIL) 50 MG tablet Take 1 tablet (50 mg total) by mouth daily. 30 tablet 5  . hydrOXYzine (VISTARIL) 25 MG capsule Take 1 capsule by mouth 4 (four) times  daily.  5  . ibuprofen (ADVIL,MOTRIN) 200 MG tablet Take 400-600 mg by mouth daily as needed for headache.     . losartan (COZAAR) 100 MG tablet Take 1 tablet (100 mg total) by mouth daily. 90 tablet 3  . Melatonin 5 MG LOZG Place 5 mg under the tongue at bedtime.    . naproxen (NAPROSYN) 500 MG tablet Take 500 mg by mouth 3 (three) times daily with meals.    . traZODone (DESYREL) 100 MG tablet Take 50 mg by mouth at bedtime.      No current facility-administered medications for this visit.     Objective: BP 130/82 (BP Location: Left Arm, Patient Position: Sitting, Cuff Size: Normal)   Pulse 68   Temp 97.6 F (36.4 C) (Oral)   Wt 264 lb (119.7 kg)   SpO2 95%   BMI 37.88 kg/m  Gen: NAD, resting comfortably CV: RRR no murmurs rubs or gallops Lungs: CTAB no crackles, wheeze, rhonchi Abdomen: soft/nontender/nondistended/obese Ext: 1+ edema stable Skin: warm, dry Psych: improved affect  Assessment/Plan:  Hypertension Hyponatremia S: controlled on losartan 100mg  last visit . Has been high surrounding transcranial magnetic stimulation for severe depression related to his bipolar disorder.   Also started on HCTZ 50mg  (intended 25mg  but sent in as 50mg ). Has moved down around treatments to 130/80 or low 90s- so much improved. Forgot to take this morning.  Also has history of hyponatremia on last labs- noted in records for 2-3 years and thought related to prozac.  BP Readings from Last 3 Encounters:  10/13/16 130/82  09/09/16 120/78  09/01/16 (!) 136/94  A/P: We discussed blood pressure goal of <140/90. Continue current meds:  But reduce hctz to 25mg  (he will move dose to night because he states already urinates fair amount and prefers PM dosing)  Check bmet- may have him go down to 12.5 mg as well   Future Appointments Date Time Provider Department Center  01/11/2017 10:00 AM Shelva Majestic, MD [facility] None   Orders Placed This Encounter  Procedures  . Flu Vaccine QUAD  36+ mos IM  . Basic metabolic panel    [place]    Meds ordered this encounter  Medications  . hydrochlorothiazide (HYDRODIURIL) 25 MG tablet    Sig: Take 1 tablet (25 mg total) by mouth daily.    Dispense:  90 tablet    Refill:  3    Return precautions advised.  Tana Conch, MD

## 2016-10-13 NOTE — Assessment & Plan Note (Signed)
Hyponatremia S: controlled on losartan 100mg  last visit . Has been high surrounding transcranial magnetic stimulation for severe depression related to his bipolar disorder.   Also started on HCTZ 50mg  (intended 25mg  but sent in as 50mg ). Has moved down around treatments to 130/80 or low 90s- so much improved. Forgot to take this morning.   Also has history of hyponatremia on last labs- noted in records for 2-3 years and thought related to prozac.  BP Readings from Last 3 Encounters:  10/13/16 130/82  09/09/16 120/78  09/01/16 (!) 136/94  A/P: We discussed blood pressure goal of <140/90. Continue current meds:  But reduce hctz to 25mg  (he will move dose to night because he states already urinates fair amount and prefers PM dosing)  Check bmet- may have him go down to 12.5 mg as well

## 2016-10-13 NOTE — Patient Instructions (Addendum)
Flu shot today  Decrease hydrochlorothiazide to 25mg .depending on sodium I may reduce this further or stop it- especially after the TMS ends

## 2016-11-05 ENCOUNTER — Encounter: Payer: Self-pay | Admitting: Family Medicine

## 2016-11-05 ENCOUNTER — Ambulatory Visit (INDEPENDENT_AMBULATORY_CARE_PROVIDER_SITE_OTHER): Payer: BLUE CROSS/BLUE SHIELD | Admitting: Family Medicine

## 2016-11-05 VITALS — BP 118/78 | HR 86 | Temp 98.7°F | Ht 70.0 in | Wt 267.0 lb

## 2016-11-05 DIAGNOSIS — R51 Headache: Secondary | ICD-10-CM | POA: Diagnosis not present

## 2016-11-05 DIAGNOSIS — R5383 Other fatigue: Secondary | ICD-10-CM

## 2016-11-05 DIAGNOSIS — R519 Headache, unspecified: Secondary | ICD-10-CM

## 2016-11-05 NOTE — Patient Instructions (Signed)
Hold hydrochlorothiazide for the next week. See me back in 1 week  Please stop by lab before you go  We will call you within a week or two about your referral for MRI. If you do not hear within 3 weeks, give [place] a call.   If symptoms resolve we will cancel MRI. If MRI moves forward we will get you a xanax to use for the exam

## 2016-11-05 NOTE — Progress Notes (Signed)
Subjective:  [name] is a 61 y.o. year old very pleasant male patient who presents for/with See problem oriented charting ROS- baseline  SOB- not increased. No chest pain. No increased edema. Admits to fatigue/extreme drowsiness.  Past Medical History-  Patient Active Problem List   Diagnosis Date Noted  . COPD (chronic obstructive pulmonary disease) (HCC) 02/27/2015    Priority: High  . Alcohol dependence (HCC) 07/29/2011    Priority: High  . Hypertension 07/09/2016    Priority: Medium  . History of adenomatous polyp of colon 07/09/2016    Priority: Medium  . Hand arthritis 07/09/2016    Priority: Medium  . Chronic maxillary sinusitis 09/27/2013    Priority: Medium  . Bipolar affective disorder, depressed, moderate (HCC) 07/29/2011    Priority: Medium    Class: Chronic  . Obesity, Class II, BMI 35-39.9 07/09/2016    Priority: Low  . Edema 07/09/2016    Priority: Low  . Dyspnea on exertion 01/18/2015    Priority: Low  . Lung mass 10/10/2013    Priority: Low  . GERD (gastroesophageal reflux disease) 09/27/2013    Priority: Low  . Chronic cough 06/21/2013    Priority: Low  . Hyponatremia 10/13/2016    Medications- reviewed and updated Current Outpatient Prescriptions  Medication Sig Dispense Refill  . ANORO ELLIPTA 62.5-25 MCG/INH AEPB USE 1 PUFF DAILY AS DIRECTED 60 each 5  . ARIPiprazole (ABILIFY) 10 MG tablet Take 10 mg by mouth daily.    Marland Kitchen FLUoxetine (PROZAC) 20 MG tablet Take 60 mg by mouth daily.    Marland Kitchen gabapentin (NEURONTIN) 800 MG tablet Take 1 tablet by mouth 3 (three) times daily.   2  . hydrochlorothiazide (HYDRODIURIL) 50 MG tablet 50 mg daily.   4  . hydrOXYzine (VISTARIL) 50 MG capsule 50 mg 4 (four) times daily as needed.   2  . losartan (COZAAR) 100 MG tablet Take 1 tablet (100 mg total) by mouth daily. 90 tablet 3  . Melatonin 5 MG LOZG Place 5 mg under the tongue at bedtime.    . naproxen (NAPROSYN) 500 MG tablet Take 500 mg by mouth 3 (three) times  daily with meals.    . traZODone (DESYREL) 100 MG tablet Take 50 mg by mouth at bedtime.     Marland Kitchen ibuprofen (ADVIL,MOTRIN) 200 MG tablet Take 400-600 mg by mouth daily as needed for headache.      No current facility-administered medications for this visit.     Objective: BP 118/78 (BP Location: Left Arm, Patient Position: Sitting, Cuff Size: Normal)   Pulse 86   Temp 98.7 F (37.1 C) (Oral)   Ht  (1.778 m)   Wt 267 lb (121.1 kg)   SpO2 93%   BMI 38.31 kg/m  Gen: NAD, resting comfortably TM normal, oropharynx normal CV: RRR no murmurs rubs or gallops Lungs: CTAB no crackles, wheeze, rhonchi Abdomen: soft/nontender- very mild pain with deep palpation in LLQ/nondistended/normal bowel sounds. No rebound or guarding.  Ext: Trace edema Skin: warm, dry, no rash Neuro: CN II-XII intact, sensation and reflexes normal throughout, 5/5 muscle strength in bilateral upper and lower extremities. Normal finger to nose. Normal rapid alternating movements. No pronator drift. Normal romberg. Normal gait.    Assessmenjt/Plan:  Fatigue, unspecified type - Plan: CBC with Differential/Platelet, Comprehensive metabolic panel, TSH, Urinalysis  New onset headache (intractable) - Plan: CBC with Differential/Platelet, Comprehensive metabolic panel, Urinalysis, MR Brain W Wo Contrast  S: Patient has had increased fatigue for last  3 weeks. No clear trigger. No fever or chills. no stiff neck. DId get flu shot before this started.  Feels like he has a mental fog. History of depression but doing well recently- denies depressed mood and he is very in tune with this. Having a headache daily - taking naproxen BID- helps but does not get rid of headache in occiput- can be moderate in intensity. Last few months has started with headaches- not a person with headaches in his lifetime. Feels like gait and speech are slower than usually- though no slurred words (not obvious in discussion).  No shuffling gait. No falls.  No unintentional weight loss or night sweats  Wonders if this started after hctz added. Has had some lightheadedness with it.   Slept 9 hours last night, woke up and had to take a 2 hour nap before coming here- very sleepy. Does not feel rested after sleep.  A/P: Unclear cause of symptoms from history. Doubt meningitis without fever or neck stiffness. No clear infectious source. ? Triggered by flu shot. ? Triggered by HCTZ. Hyponatremia in past- will update bmp as could have worsened.  -Stop HCTZ. Follow up 1 week. Sooner if new or worsening symptoms.  - New headache over 50:Mr brain with and without contrast. May give Xanax 0.25mg  #2 around time of MRI. Order next week for him if symptoms persist.  - get labs to investigate causes of fatigue:Cmet, cbc, tsh - could be OSA but would doubt such acute onset of symptoms - asked him to discuss if these are potential side effects after TMS with psychiatry.   Orders Placed This Encounter  Procedures  . MR Brain W Wo Contrast    Standing Status:   Future    Standing Expiration Date:   01/05/2018    Order Specific Question:   If indicated for the ordered procedure, I authorize the administration of contrast media per Radiology protocol    Answer:   Yes    Order Specific Question:   What is the patient's sedation requirement?    Answer:   Anti-anxiety    Order Specific Question:   Does the patient have a pacemaker or implanted devices?    Answer:   No    Order Specific Question:   Radiology Contrast Protocol - do NOT remove file path    Answer:   [email or web address]    Order Specific Question:   Preferred imaging location?    Answer:   [facility] (table limit-350 lbs)  . CBC with Differential/Platelet  . Comprehensive metabolic panel    [place]  . TSH    [place]  . Urinalysis    Standing Status:   Future    Number of Occurrences:   1    Standing Expiration Date:   12/05/2016   Was only on  HCTZ at  time of visit- chart erroneously updated by staff to state  instead of  Meds ordered this encounter  Medications  . DISCONTD: hydrochlorothiazide (HYDRODIURIL) 50 MG tablet    Sig: 50 mg daily.     Refill:  4  . hydrOXYzine (VISTARIL) 50 MG capsule    Sig: 50 mg 4 (four) times daily as needed.     Refill:  2   The duration of face-to-face time during this visit was greater than 25 minutes. Greater than 50% of this time was spent in counseling, explanation of diagnosis, planning of further management, and/or coordination of care including discussion of potential causes, going over workup.  Return precautions advised.  Garret Reddish, MD

## 2016-11-06 LAB — URINALYSIS
Bilirubin Urine: NEGATIVE
HGB URINE DIPSTICK: NEGATIVE
Ketones, ur: NEGATIVE
Leukocytes, UA: NEGATIVE
Nitrite: NEGATIVE
PH: 7.5 (ref 5.0–8.0)
Specific Gravity, Urine: 1.015 (ref 1.000–1.030)
TOTAL PROTEIN, URINE-UPE24: NEGATIVE
URINE GLUCOSE: NEGATIVE
Urobilinogen, UA: 1 (ref 0.0–1.0)

## 2016-11-06 LAB — COMPREHENSIVE METABOLIC PANEL
ALK PHOS: 92 U/L (ref 39–117)
ALT: 35 U/L (ref 0–53)
AST: 23 U/L (ref 0–37)
Albumin: 4.5 g/dL (ref 3.5–5.2)
BILIRUBIN TOTAL: 1 mg/dL (ref 0.2–1.2)
BUN: 15 mg/dL (ref 6–23)
CALCIUM: 9.2 mg/dL (ref 8.4–10.5)
CO2: 27 meq/L (ref 19–32)
Chloride: 92 mEq/L — ABNORMAL LOW (ref 96–112)
Creatinine, Ser: 0.83 mg/dL (ref 0.40–1.50)
GFR: 99.34 mL/min (ref >60.00)
Glucose, Bld: 98 mg/dL (ref 70–99)
POTASSIUM: 4.3 meq/L (ref 3.5–5.1)
Sodium: 125 mEq/L — ABNORMAL LOW (ref 135–145)
TOTAL PROTEIN: 6.6 g/dL (ref 6.0–8.3)

## 2016-11-06 LAB — CBC WITH DIFFERENTIAL/PLATELET
Basophils Absolute: 0.1 10*3/uL (ref 0.0–0.1)
Basophils Relative: 1 % (ref 0.0–3.0)
EOS PCT: 3.7 % (ref 0.0–5.0)
Eosinophils Absolute: 0.3 10*3/uL (ref 0.0–0.7)
HEMATOCRIT: 38.9 % — AB (ref 39.0–52.0)
Hemoglobin: 12.8 g/dL — ABNORMAL LOW (ref 13.0–17.0)
LYMPHS ABS: 2 10*3/uL (ref 0.7–4.0)
LYMPHS PCT: 27.1 % (ref 12.0–46.0)
MCHC: 32.9 g/dL (ref 30.0–36.0)
MCV: 79.2 fl (ref 78.0–100.0)
MONOS PCT: 8.5 % (ref 3.0–12.0)
Monocytes Absolute: 0.6 10*3/uL (ref 0.1–1.0)
NEUTROS ABS: 4.4 10*3/uL (ref 1.4–7.7)
NEUTROS PCT: 59.7 % (ref 43.0–77.0)
PLATELETS: 293 10*3/uL (ref 150.0–400.0)
RBC: 4.91 Mil/uL (ref 4.22–5.81)
RDW: 13.3 % (ref 11.5–15.5)
WBC: 7.3 10*3/uL (ref 4.0–10.5)

## 2016-11-06 LAB — TSH: TSH: 1.3 u[IU]/mL (ref 0.35–4.50)

## 2016-11-12 ENCOUNTER — Ambulatory Visit (INDEPENDENT_AMBULATORY_CARE_PROVIDER_SITE_OTHER): Payer: BLUE CROSS/BLUE SHIELD | Admitting: Family Medicine

## 2016-11-12 ENCOUNTER — Encounter: Payer: Self-pay | Admitting: Family Medicine

## 2016-11-12 VITALS — BP 126/76 | HR 81 | Temp 98.4°F | Ht 70.0 in | Wt 266.8 lb

## 2016-11-12 DIAGNOSIS — I10 Essential (primary) hypertension: Secondary | ICD-10-CM

## 2016-11-12 DIAGNOSIS — R0683 Snoring: Secondary | ICD-10-CM

## 2016-11-12 DIAGNOSIS — R400 Somnolence: Secondary | ICD-10-CM

## 2016-11-12 DIAGNOSIS — D649 Anemia, unspecified: Secondary | ICD-10-CM | POA: Diagnosis not present

## 2016-11-12 DIAGNOSIS — E871 Hypo-osmolality and hyponatremia: Secondary | ICD-10-CM

## 2016-11-12 DIAGNOSIS — R5383 Other fatigue: Secondary | ICD-10-CM

## 2016-11-12 NOTE — Patient Instructions (Signed)
Please stop by lab before you go  We will call you within a week or two about your referral to pulmonology for sleep apnea testing. If you do not hear within 3 weeks, give [place] a call.   Thrilled you are feeling better off the hydrochlorothiazide

## 2016-11-12 NOTE — Progress Notes (Signed)
Subjective:  [name] is a 61 y.o. year old very pleasant male patient who presents for/with See problem oriented charting ROS- no SI- thoughts that he would be better off dead but never of self harm. Had some anhedonia and depressed mood but not as severe as prior. No chest pain or shortness of breath. Snores, somnolent in daytime- has to take naps.    Past Medical History-  Patient Active Problem List   Diagnosis Date Noted  . Hyponatremia 10/13/2016    Priority: High  . COPD (chronic obstructive pulmonary disease) (HCC) 02/27/2015    Priority: High  . Alcohol dependence (HCC) 07/29/2011    Priority: High  . Hypertension 07/09/2016    Priority: Medium  . History of adenomatous polyp of colon 07/09/2016    Priority: Medium  . Hand arthritis 07/09/2016    Priority: Medium  . Chronic maxillary sinusitis 09/27/2013    Priority: Medium  . Bipolar affective disorder, depressed, moderate (HCC) 07/29/2011    Priority: Medium    Class: Chronic  . Obesity, Class II, BMI 35-39.9 07/09/2016    Priority: Low  . Edema 07/09/2016    Priority: Low  . Dyspnea on exertion 01/18/2015    Priority: Low  . Lung mass 10/10/2013    Priority: Low  . GERD (gastroesophageal reflux disease) 09/27/2013    Priority: Low  . Chronic cough 06/21/2013    Priority: Low    Medications- reviewed and updated Current Outpatient Prescriptions  Medication Sig Dispense Refill  . ANORO ELLIPTA 62.5-25 MCG/INH AEPB USE 1 PUFF DAILY AS DIRECTED 60 each 5  . ARIPiprazole (ABILIFY) 10 MG tablet Take 5 mg by mouth daily.     Marland Kitchen FLUoxetine (PROZAC) 20 MG tablet Take 40 mg by mouth daily.     Marland Kitchen gabapentin (NEURONTIN) 800 MG tablet Take 1 tablet by mouth 3 (three) times daily.   2  . hydrOXYzine (VISTARIL) 50 MG capsule 25 mg 4 (four) times daily as needed.   2  . ibuprofen (ADVIL,MOTRIN) 200 MG tablet Take 400-600 mg by mouth daily as needed for headache.     . losartan (COZAAR) 100 MG tablet Take 1 tablet (100 mg  total) by mouth daily. 90 tablet 3  . Melatonin 5 MG LOZG Place 5 mg under the tongue at bedtime.    . naproxen (NAPROSYN) 500 MG tablet Take 500 mg by mouth 3 (three) times daily with meals.    . traZODone (DESYREL) 100 MG tablet Take 50 mg by mouth at bedtime.      No current facility-administered medications for this visit.     Objective: BP 126/76 (BP Location: Left Arm, Patient Position: Sitting, Cuff Size: Large)   Pulse 81   Temp 98.4 F (36.9 C) (Oral)   Ht  (1.778 m)   Wt 266 lb 12.8 oz (121 kg)   SpO2 95%   BMI 38.28 kg/m  Gen: NAD, resting comfortably CV: RRR no murmurs rubs or gallops Lungs: CTAB no crackles, wheeze, rhonchi Abdomen:obese Ext: stable 1+ edema Skin: warm, dry  Assessment/Plan:  Fatigue, unspecified type - Plan: Ambulatory referral to Pulmonology, CANCELED: Ambulatory referral to Pulmonology  Snoring - Plan: Ambulatory referral to Pulmonology, CANCELED: Ambulatory referral to Pulmonology  Daytime somnolence - Plan: Ambulatory referral to Pulmonology, CANCELED: Ambulatory referral to Pulmonology  Hyponatremia - Plan: Basic metabolic panel  Anemia, unspecified type - Plan: CBC : last week we discussed increased fatigue x 3 weeks without clear trigger (other than getting flu  shot before this started, also finishing TMS therapy for depression). He compliained of fatigue, mental fog, depressed mood actually being ok, daily headache- naproxen BID- without history of headaches, felt like gait and speech were slower). Also some concern this started after HCTZ started. He was able to sleep 9 hours, wake up then take 2 hour nap shortly therafter. See last note. We stopped HCTZ to see if it was a trigger (hyponatremia has been chronic issue but had been slightly worse). Ordered MRI brain with plan for xanax if symptoms did not improve off hctz). Labs without clear cause other than possible hyponatremia. Asked him to discuss with psychiatry to see if could be  related to TMS  Today, states 50-60% better once off the HCTZ. He really feels this was the issue with his mental fog and most of his fatigue. States he is still fatigued but not to the same level- current level of fatigue present for several years but he thought it was just his depression before but feels depression is doing better. PHQ9 still at 17 though.    Headaches are much improved but once again headaches are not normal for him outside of the last year. He wants [place] to cancel MRI given drastic improvement- understands recommendations to still follow through with this due to new headache over 50 but wants to not move forward.   Daytime somnolence- if meditating may fall asleep even if he gets his nap that day. Snores some in the evening. Heavy fatigue. Has gained over 20 lbs since last study  Mild anemia but no blood in stool and no melena. No hematuria either reported or on UA last week. Not hemoptysis.   Has been trying to walk- about a mile the last few days.   Had echo 10/04/13 for fatigue , dyspnea- grade 1 diastolic dysfunction only.  A/P: Fatigue- drastic improvement off HCTZ- likely related to dehydration and driving BP lower.could have been related to hyponatremia as well.  Overall improved but with lingering fatigue that could be depression related -will update cbc- make sure anemia not worsening - update bmp- hopefuly sodium at least closer to 130 (more of his baseline) - he wants to cancel MRI head- since headaches are minimal at this point- declines following through though I discussed recommendations based on age over 32 and new headaches and therefore risks such as cancer/mass.  - refer to pulmonology for sleep study. Apparently had one several years ago but has gained over 20 lbs since then and having more daytime somnolence  Hypertension Controlled on losartan  alone. HCTZ seems to worsen his hyponatremia so will try to avoid in future.   Future Appointments Date  Time Provider Department Center  01/11/2017 10:00 AM Shelva Majestic, MD [facility] None   Orders Placed This Encounter  Procedures  . CBC    [place]  . Basic metabolic panel      . Ambulatory referral to Pulmonology    Referral Priority:   Routine    Referral Type:   Consultation    Referral Reason:   Specialty Services Required    Requested Specialty:   Pulmonary Disease    Number of Visits Requested:   1   The duration of face-to-face time during this visit was greater than 25 minutes. Greater than 50% of this time was spent in counseling, explanation of diagnosis, planning of further management, and/or coordination of care including counseling about reasoning for MRI, discussing strain of chronic fatigue, discussing role of exercise  in improving symptoms, discussing possible sleep apnea. .      Return precautions advised.  Tana Conch, MD

## 2016-11-12 NOTE — Assessment & Plan Note (Signed)
Controlled on losartan  alone. HCTZ seems to worsen his hyponatremia so will try to avoid in future.

## 2016-11-13 ENCOUNTER — Telehealth: Payer: Self-pay | Admitting: Emergency Medicine

## 2016-11-13 DIAGNOSIS — J449 Chronic obstructive pulmonary disease, unspecified: Secondary | ICD-10-CM

## 2016-11-13 DIAGNOSIS — R0683 Snoring: Secondary | ICD-10-CM

## 2016-11-13 LAB — CBC
HEMATOCRIT: 37.6 % — AB (ref 39.0–52.0)
HEMOGLOBIN: 12.3 g/dL — AB (ref 13.0–17.0)
MCHC: 32.7 g/dL (ref 30.0–36.0)
MCV: 80.3 fl (ref 78.0–100.0)
Platelets: 249 10*3/uL (ref 150.0–400.0)
RBC: 4.68 Mil/uL (ref 4.22–5.81)
RDW: 13.4 % (ref 11.5–15.5)
WBC: 6.4 10*3/uL (ref 4.0–10.5)

## 2016-11-13 LAB — BASIC METABOLIC PANEL
BUN: 12 mg/dL (ref 6–23)
CO2: 25 mEq/L (ref 19–32)
CREATININE: 0.78 mg/dL (ref 0.40–1.50)
Calcium: 8.9 mg/dL (ref 8.4–10.5)
Chloride: 99 mEq/L (ref 96–112)
GFR: 106.72 mL/min (ref >60.00)
Glucose, Bld: 106 mg/dL — ABNORMAL HIGH (ref 70–99)
Potassium: 4.3 mEq/L (ref 3.5–5.1)
Sodium: 131 mEq/L — ABNORMAL LOW (ref 135–145)

## 2016-11-13 NOTE — Telephone Encounter (Signed)
RB are you ok with ordering the sleep study that Dr. Natalia Leatherwood that the pt needs to have?  Please advise. Thanks

## 2016-11-13 NOTE — Telephone Encounter (Signed)
Yes order split night sleep study. Thanks.

## 2016-11-13 NOTE — Telephone Encounter (Signed)
Order for the split night study has been ordered.  Nothing further is needed.

## 2016-11-16 ENCOUNTER — Telehealth: Payer: Self-pay | Admitting: Emergency Medicine

## 2016-11-16 ENCOUNTER — Other Ambulatory Visit: Payer: Self-pay

## 2016-11-16 ENCOUNTER — Telehealth: Payer: Self-pay | Admitting: Family Medicine

## 2016-11-16 DIAGNOSIS — D649 Anemia, unspecified: Secondary | ICD-10-CM

## 2016-11-16 NOTE — Telephone Encounter (Signed)
Dr Durene Cal had asked Dr Delton Coombes to order sleep study for pt.  I scheduled his sleep study & had left him a vm to call me.  I have spoken to the pt & have given him the appt info.  Nothing further needed.

## 2016-11-16 NOTE — Telephone Encounter (Signed)
Patient returning missed phone call to discuss lab results. Please call patient and advise. OK to leave message.

## 2016-11-16 NOTE — Telephone Encounter (Signed)
PCC's did you call pt because he has no labs or xray results and no phone message from clinical.

## 2016-11-16 NOTE — Telephone Encounter (Signed)
Called and spoke with patient.

## 2016-12-02 ENCOUNTER — Encounter: Payer: Self-pay | Admitting: Emergency Medicine

## 2016-12-02 ENCOUNTER — Ambulatory Visit (INDEPENDENT_AMBULATORY_CARE_PROVIDER_SITE_OTHER): Payer: BLUE CROSS/BLUE SHIELD | Admitting: Emergency Medicine

## 2016-12-02 DIAGNOSIS — J449 Chronic obstructive pulmonary disease, unspecified: Secondary | ICD-10-CM | POA: Diagnosis not present

## 2016-12-02 MED ORDER — AZITHROMYCIN 250 MG PO TABS: ORAL_TABLET | ORAL | 0 refills | Status: AC

## 2016-12-02 NOTE — Progress Notes (Signed)
Subjective:    Patient ID: [name], male    DOB: [birth year], 61 y.o.   MRN: [id number]  HPI  12/02/2016  Chief Complaint  Patient presents with  . Acute Visit    Reports increased SOB and chest tightness. Denies wheezing or coughing. Symptoms started 2 weeks ago.   61 year old man former smoker, history of bipolar disorder and prior substance abuse. Has been evaluated for cough and for  Abnormal chest imaging. He underwent bronchoscopy with removal of a foreign body from the right lower lobe bronchus as mentioned above. He had grossly normal spirometry 06/12/13. 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  He reports today that he is having exertional SOB, especially with stairs, can make him panic. He has some wheezing, some cough that progresses thru the day. He has a repeat Ct scan chest and neck due for 01/29/15 ordered by Dr Tyrone SageGerhardt.                         ROV 08/24/16 -- patient has a history of COPD and chronic cough. At his last visit we changed Symbicort to Anoro. Also started him on loratadine. He believes that the Anoro helps him, as good or better than the Symbicort. He reports that he has been having more trouble over the last few weeks during the increased heat. He is also dealing with depression. He does not have an albuterol HFA to use prn. He does have daily cough is productive of clear mucous. He has not had any exacerbations, no prednisone.  Acute OV 10/.17/18 -- Patient has a history of COPD, chronic cough, depression. With left hemidiaphragm paralysis. He presents today an acute visit, has been experiencing . He has been working with Dr Durene CalHunter on BP management > on losartan titrating 50 to 100mg  for last 3 months, also started HCTZ but felt very drained and became hyponatremic so stopped 1 month ago. Has been on Fe for anemia. He feels very drained, wants to sleep, feels difficult to get a deep breath in. He has been coughing more for the last week, has been wheezing  often at night. He has gained about 20 lbs over the last 2 years. He is on loratadine, Anoro, has not tried any of his albuterol. He has a sleep study set for 12/30/16.    Review of Systems As per HPI     Objective:   Physical Exam Vitals:   12/02/16 1003  BP: (!) 144/82  Pulse: 94  SpO2: 96%  Weight: 268 lb (121.6 kg)  Height: 5\' 10"  (1.778 m)   Gen: Pleasant, Obese man, in no distress,  normal affect  ENT: No lesions,  mouth clear,  oropharynx clear, no postnasal drip  Neck: No JVD, no TMG, no carotid bruits  Lungs: No use of accessory muscles, no wheezing or crackles  Cardiovascular: RRR, heart sounds normal, no murmur or gallops, no edema  Musculoskeletal: No deformities, no cyanosis or clubbing  Neuro: alert, non focal  Skin: Warm, no lesions or rashes      Assessment & Plan:  COPD (chronic obstructive pulmonary disease) (HCC) Significant problem with malaise, fatigue. Given his history difficult to tease out off contributors. COPD certainly could be one of the contributors limiting his functional capacity. So no that symptoms worsened when his blood pressure medications were modified, he has significant depression that has been limiting at times over the years. Also high suspicion for obstructive sleep apnea. His  is going to be worked up. I considered trying treatment for acute exacerbation to see if this would change his stamina, generalized symptoms. He tolerates prednisone very poorly, has side effects. I will defer this and use an antibiotic to treat any component of possible bronchitis. He does need to get the PSG, will review with him after completed.   We will try treating with azithromycin. Please stay on this until completely gone Continue your Anoro as you have been taking it Use albuterol 2 puffs if needed for shortness of breath, wheezing Please get your sleep study on 12/30/16 as planned. Follow with Dr. Delton Coombes a few weeks after your sleep study to review  the results and plan treatment if indicated  [name], MD, PhD 12/02/2016, 10:25 AM [facility] [phone] or if no answer [phone]

## 2016-12-02 NOTE — Patient Instructions (Signed)
We will try treating with azithromycin. Please stay on this until completely gone Continue your Anoro as you have been taking it Use albuterol 2 puffs if needed for shortness of breath, wheezing Please get your sleep study on 12/30/16 as planned. Follow with Dr. Delton CoombesByrum a few weeks after your sleep study to review the results and plan treatment if indicated

## 2016-12-02 NOTE — Assessment & Plan Note (Signed)
Significant problem with malaise, fatigue. Given his history difficult to tease out off contributors. COPD certainly could be one of the contributors limiting his functional capacity. So no that symptoms worsened when his blood pressure medications were modified, he has significant depression that has been limiting at times over the years. Also high suspicion for obstructive sleep apnea. His is going to be worked up. I considered trying treatment for acute exacerbation to see if this would change his stamina, generalized symptoms. He tolerates prednisone very poorly, has side effects. I will defer this and use an antibiotic to treat any component of possible bronchitis. He does need to get the PSG, will review with him after completed.   We will try treating with azithromycin. Please stay on this until completely gone Continue your Anoro as you have been taking it Use albuterol 2 puffs if needed for shortness of breath, wheezing Please get your sleep study on 12/30/16 as planned. Follow with Dr. Delton CoombesByrum a few weeks after your sleep study to review the results and plan treatment if indicated

## 2016-12-21 ENCOUNTER — Other Ambulatory Visit (INDEPENDENT_AMBULATORY_CARE_PROVIDER_SITE_OTHER): Payer: BLUE CROSS/BLUE SHIELD

## 2016-12-21 DIAGNOSIS — D649 Anemia, unspecified: Secondary | ICD-10-CM | POA: Diagnosis not present

## 2016-12-22 LAB — CBC
HCT: 42.2 % (ref 39.0–52.0)
HEMOGLOBIN: 13.7 g/dL (ref 13.0–17.0)
MCHC: 32.6 g/dL (ref 30.0–36.0)
MCV: 84.5 fl (ref 78.0–100.0)
PLATELETS: 268 10*3/uL (ref 150.0–400.0)
RBC: 4.99 Mil/uL (ref 4.22–5.81)
RDW: 16.1 % — ABNORMAL HIGH (ref 11.5–15.5)
WBC: 6.9 10*3/uL (ref 4.0–10.5)

## 2016-12-22 LAB — FERRITIN: Ferritin: 126.3 ng/mL (ref 22.0–322.0)

## 2016-12-30 ENCOUNTER — Ambulatory Visit (HOSPITAL_BASED_OUTPATIENT_CLINIC_OR_DEPARTMENT_OTHER): Payer: BLUE CROSS/BLUE SHIELD | Attending: Emergency Medicine | Admitting: Pulmonary Disease

## 2016-12-30 VITALS — Ht 70.0 in | Wt 255.0 lb

## 2016-12-30 DIAGNOSIS — J449 Chronic obstructive pulmonary disease, unspecified: Secondary | ICD-10-CM

## 2016-12-30 DIAGNOSIS — G4733 Obstructive sleep apnea (adult) (pediatric): Secondary | ICD-10-CM | POA: Insufficient documentation

## 2016-12-30 DIAGNOSIS — Z79899 Other long term (current) drug therapy: Secondary | ICD-10-CM | POA: Diagnosis not present

## 2016-12-30 DIAGNOSIS — R0683 Snoring: Secondary | ICD-10-CM

## 2017-01-05 ENCOUNTER — Telehealth: Payer: Self-pay | Admitting: Emergency Medicine

## 2017-01-05 DIAGNOSIS — J449 Chronic obstructive pulmonary disease, unspecified: Secondary | ICD-10-CM | POA: Diagnosis not present

## 2017-01-05 MED ORDER — PREDNISONE 10 MG PO TABS: ORAL_TABLET | ORAL | 0 refills | Status: DC

## 2017-01-05 MED ORDER — DOXYCYCLINE HYCLATE 100 MG PO TABS: 100.0000 mg | ORAL_TABLET | Freq: Two times a day (BID) | ORAL | 0 refills | Status: DC

## 2017-01-05 NOTE — Telephone Encounter (Signed)
Spoke with pt. He is aware of MR's recommendations. Rxs have been sent in. Nothing further was needed. 

## 2017-01-05 NOTE — Procedures (Signed)
   Patient Name: [name], [name] Date: 12/30/2016 Gender: Male D.O.B: 1955/07/10 Age (years): 22 Referring Provider: Baltazar Apo Height (inches): 98 Interpreting Physician: Chesley Mires MD, ABSM Weight (lbs): 255 RPSGT: Carolin Coy BMI: 37 MRN: [id number] Neck Size: 19.00  CLINICAL INFORMATION The patient is referred for a split night study.  MEDICATIONS Medications self-administered by patient taken the night of the study : TRAZODONE, MELATONIN  SLEEP STUDY TECHNIQUE As per the AASM Manual for the Scoring of Sleep and Associated Events v2.3 (April 2016) with a hypopnea requiring 4% desaturations.  The channels recorded and monitored were frontal, central and occipital EEG, electrooculogram (EOG), submentalis EMG (chin), nasal and oral airflow, thoracic and abdominal wall motion, anterior tibialis EMG, snore microphone, electrocardiogram, and pulse oximetry. Bi-level positive airway pressure (BiPAP) was initiated when the patient met split night criteria and was titrated according to treat sleep-disordered breathing.  RESPIRATORY PARAMETERS Diagnostic  Total AHI (/hr): 27.4 RDI (/hr): 27.8 OA Index (/hr): 17.5 CA Index (/hr): 0.8 REM AHI (/hr): 19.2 NREM AHI (/hr): 28.2 Supine AHI (/hr): 33.7 Non-supine AHI (/hr): 0.00 Min O2 Sat (%): 87.00 Mean O2 (%): 93.12 Time below 88% (min): 0.3   Titration  Optimal IPAP Pressure (cm): 10 Optimal EPAP Pressure (cm): 10 AHI at Optimal Pressure (/hr): 0.0 Min O2 at Optimal Pressure (%): 92.0 Sleep % at Optimal (%): 100 Supine % at Optimal (%): 100     SLEEP ARCHITECTURE The study was initiated at 10:47:59 PM and terminated at 5:21:48 AM. The total recorded time was 393.8 minutes. EEG confirmed total sleep time was 359.9 minutes yielding a sleep efficiency of 91.4%. Sleep onset after lights out was 1.9 minutes with a REM latency of 105.0 minutes. The patient spent 11.51% of the night in stage N1 sleep, 58.21% in stage N2 sleep, 0.00%  in stage N3 and 30.29% in REM. Wake after sleep onset (WASO) was 32.0 minutes. The Arousal Index was 8.5/hour.  LEG MOVEMENT DATA The total Periodic Limb Movements of Sleep (PLMS) were 0. The PLMS index was 0.00 .  CARDIAC DATA The 2 lead EKG demonstrated sinus rhythm. The mean heart rate was 63.37 beats per minute. Other EKG findings include: None.  IMPRESSIONS - He was found to have moderate obstructive sleep apnea with an AHI of 27.4 and SaO2 low of 87%. - He did well with CPAP 10 cm H2O.  At higher CPAP settings he developed central apneas, and these did not improve with transition to Bipap. - He did not require supplemental oxygen.  DIAGNOSIS - Obstructive Sleep Apnea (327.23 [G47.33 ICD-10])  RECOMMENDATIONS - Trial of CPAP therapy on 10 cm H2O with a Large size Resmed Full Face Mask Mirage Quattro mask and heated humidification.  [Electronically signed] 01/05/2017 12:00 PM  Chesley Mires MD, [place], American Board of Sleep Medicine   NPI: [id number]

## 2017-01-05 NOTE — Telephone Encounter (Signed)
Could be aecopd though symptoms not fully clasic  Plan Take doxycycline 100mg  po twice daily x 5 days; take after meals and avoid sunlight   Please take prednisone 40 mg x1 day, then 30 mg x1 day, then 20 mg x1 day, then 10 mg x1 day, and then 5 mg x1 day and stop  Continue anoro  Use alb prn  If pain/dyspnea/symptoms do not resolve fast neough, any concern go  To ER   Sleep study results: per DR Sood/Byrum  Dr. Kalman ShanMurali Shantanu Strauch, M.D., [facility]P Pulmonary and Critical Care Medicine Staff Physician [facility] Pager: [phone], If no answer or between  15:00h - 7:00h: call [phone]  01/05/2017 11:02 AM

## 2017-01-05 NOTE — Telephone Encounter (Signed)
Spoke with pt, he states he doesn't have any breath and feels like he is breathing from the top of his lungs. He has symptoms of chest congestion, some chest pain. The claritin does not help and feels like he is on the verge of a panic attack because he is not able to breathe. He is using his Anoro everyday as prescribed. He states he has a RX for a rescue inhaler but he has not gotten this RX filled. DOD please advise.  He would also like his results from his split study if possible  [facility]

## 2017-01-11 ENCOUNTER — Ambulatory Visit: Payer: BLUE CROSS/BLUE SHIELD | Admitting: Family Medicine

## 2017-01-11 ENCOUNTER — Encounter: Payer: Self-pay | Admitting: Family Medicine

## 2017-01-11 VITALS — BP 134/78 | HR 76 | Temp 97.6°F | Ht 70.0 in | Wt 276.0 lb

## 2017-01-11 DIAGNOSIS — R21 Rash and other nonspecific skin eruption: Secondary | ICD-10-CM

## 2017-01-11 DIAGNOSIS — G4733 Obstructive sleep apnea (adult) (pediatric): Secondary | ICD-10-CM | POA: Insufficient documentation

## 2017-01-11 DIAGNOSIS — I10 Essential (primary) hypertension: Secondary | ICD-10-CM

## 2017-01-11 MED ORDER — TRIAMCINOLONE ACETONIDE 0.5 % EX OINT: 1.0000 "application " | TOPICAL_OINTMENT | Freq: Two times a day (BID) | CUTANEOUS | 2 refills | Status: DC

## 2017-01-11 NOTE — Patient Instructions (Signed)
6 month follow up for physical  No changes today  Will trial your ointment again to help with the intermittent rash. Try showering every other day. Try to lubricate on days you shower with something like vaseline, eucerin, aquaphor.

## 2017-01-11 NOTE — Progress Notes (Signed)
Subjective:  [name] is a 61 y.o. year old very pleasant male patient who presents for/with See problem oriented charting ROS- no SI. No chest pain. Mild fatigue. No edema.    Past Medical History-  Patient Active Problem List   Diagnosis Date Noted  . Hyponatremia 10/13/2016    Priority: High  . COPD (chronic obstructive pulmonary disease) (HCC) 02/27/2015    Priority: High  . Alcohol dependence (HCC) 07/29/2011    Priority: High  . OSA (obstructive sleep apnea) 01/11/2017    Priority: Medium  . Hypertension 07/09/2016    Priority: Medium  . History of adenomatous polyp of colon 07/09/2016    Priority: Medium  . Hand arthritis 07/09/2016    Priority: Medium  . Chronic maxillary sinusitis 09/27/2013    Priority: Medium  . Bipolar affective disorder, depressed, moderate (HCC) 07/29/2011    Priority: Medium    Class: Chronic  . Obesity, Class II, BMI 35-39.9 07/09/2016    Priority: Low  . Edema 07/09/2016    Priority: Low  . Dyspnea on exertion 01/18/2015    Priority: Low  . Lung mass 10/10/2013    Priority: Low  . GERD (gastroesophageal reflux disease) 09/27/2013    Priority: Low  . Chronic cough 06/21/2013    Priority: Low  . Rash 01/11/2017    Medications- reviewed and updated Current Outpatient Medications  Medication Sig Dispense Refill  . ANORO ELLIPTA 62.5-25 MCG/INH AEPB USE 1 PUFF DAILY AS DIRECTED 60 each 5  . ARIPiprazole (ABILIFY) 10 MG tablet Take 5 mg by mouth daily.     . ferrous sulfate 325 (65 FE) MG tablet Take 325 mg by mouth daily with breakfast.    . FLUoxetine (PROZAC) 20 MG tablet Take 40 mg by mouth daily.     Marland Kitchen. gabapentin (NEURONTIN) 800 MG tablet Take 1 tablet by mouth 3 (three) times daily.   2  . hydrOXYzine (VISTARIL) 50 MG capsule 25 mg 4 (four) times daily as needed.   2  . ibuprofen (ADVIL,MOTRIN) 200 MG tablet Take 400-600 mg by mouth daily as needed for headache.     . losartan (COZAAR) 100 MG tablet Take 1 tablet (100 mg total)  by mouth daily. 90 tablet 3  . Melatonin 5 MG LOZG Place 5 mg under the tongue at bedtime.    . naproxen (NAPROSYN) 500 MG tablet Take 500 mg by mouth 3 (three) times daily with meals.    . traZODone (DESYREL) 100 MG tablet Take 50 mg by mouth at bedtime.      No current facility-administered medications for this visit.     Objective: BP 134/78 (BP Location: Left Arm, Patient Position: Sitting, Cuff Size: Large)   Pulse 76   Temp 97.6 F (36.4 C) (Oral)   Ht 5\' 10"  (1.778 m)   Wt 276 lb (125.2 kg)   SpO2 97%   BMI 39.60 kg/m  Gen: NAD, resting comfortably CV: RRR no murmurs rubs or gallops Lungs: CTAB no crackles, wheeze, rhonchi Abdomen: soft/nontender/nondistended/normal bowel sounds. No rebound or guarding.  Ext: no edema Skin: warm, dry, on back has 4 patches or excoriation with mild erythema and slight scaling- no silverish discoloratoin.    Assessment/Plan:  Hypertension S: controlled on losartan 100mg  alone.  BP Readings from Last 3 Encounters:  01/11/17 134/78  12/02/16 (!) 144/82  11/12/16 126/76  A/P: We discussed blood pressure goal of <140/90. Continue current meds. BP has been much better since transcranial magnetic stimulation  OSA (  obstructive sleep apnea) S:  patient was referred to pulmonary for potential sleep apnea. He has been dealing with some long term fatigue and feeling of mental fog.   He had a sleep study which did show sleep apnea. Plan appears to be to trial cpap but he has upcoming appointment this week  Other issues contributing to fatigue 1. Dealing with depression followed by psychiatry- elevated PHQ9 still but not suicidal as he was previously and continues close follow up. No SI. Admits depression may contribute to fatigue.  2. Anemia resolved on last check. Has been taking iron once a day.  3. Coming off hctz reduced fatigue A/P: Saw Dr. Zollie ScaleSood/Bryum and CPAP now recommended. Patient feeling much closer to prior non fatigued state- he is  hopeful that the cpap will provide additional improvement.   Rash S:Rash on back- worse in winter- better when showers less- ran out of prior ointment which really helped from prior physician. No fever or chills.no pain. Rash is pruritic. Scratching with short term relief. No meds tried other than prior ointment which helped before he ran out.  A/P: will give triamcinolone ointment to use up to 7 days. Discussed showering less frequently.     Future Appointments  Date Time Provider Department Center  01/14/2017 10:30 AM Leslye PeerByrum, Robert S, MD [facility] None  07/13/2017  9:45 AM Shelva MajesticHunter, Danylah Holden O, MD [facility] None  6 month follow up for physical  Meds ordered this encounter  Medications  . triamcinolone ointment (KENALOG) 0.5 %    Sig: Apply 1 application topically 2 (two) times daily. 7 days maximum. Need at least 10 day break after that.    Dispense:  45 g    Refill:  2   Return precautions advised.  Tana ConchStephen Triniti Gruetzmacher, MD

## 2017-01-11 NOTE — Assessment & Plan Note (Signed)
S:Rash on back- worse in winter- better when showers less- ran out of prior ointment which really helped from prior physician. No fever or chills.no pain. Rash is pruritic. Scratching with short term relief. No meds tried other than prior ointment which helped before he ran out.  A/P: will give triamcinolone ointment to use up to 7 days. Discussed showering less frequently.

## 2017-01-11 NOTE — Assessment & Plan Note (Signed)
S:  patient was referred to pulmonary for potential sleep apnea. He has been dealing with some long term fatigue and feeling of mental fog.   He had a sleep study which did show sleep apnea. Plan appears to be to trial cpap but he has upcoming appointment this week  Other issues contributing to fatigue 1. Dealing with depression followed by psychiatry- elevated PHQ9 still but not suicidal as he was previously and continues close follow up. No SI. Admits depression may contribute to fatigue.  2. Anemia resolved on last check. Has been taking iron once a day.  3. Coming off hctz reduced fatigue A/P: Saw Dr. Zollie ScaleSood/Bryum and CPAP now recommended. Patient feeling much closer to prior non fatigued state- he is hopeful that the cpap will provide additional improvement.

## 2017-01-14 ENCOUNTER — Ambulatory Visit: Payer: BLUE CROSS/BLUE SHIELD | Admitting: Emergency Medicine

## 2017-01-14 ENCOUNTER — Encounter: Payer: Self-pay | Admitting: Emergency Medicine

## 2017-01-14 DIAGNOSIS — J449 Chronic obstructive pulmonary disease, unspecified: Secondary | ICD-10-CM | POA: Diagnosis not present

## 2017-01-14 DIAGNOSIS — G4733 Obstructive sleep apnea (adult) (pediatric): Secondary | ICD-10-CM | POA: Diagnosis not present

## 2017-01-14 NOTE — Assessment & Plan Note (Signed)
Confirmed by polysomnogram 12/30/16.  I will start him on CPAP 10 cmH2O.  We will try to use nasal pillows.  If he has leak then we will add a chinstrap.  I will follow with him in 3 months to ensure good compliance and benefit.

## 2017-01-14 NOTE — Addendum Note (Signed)
Addended by: Jaynee EaglesLEMONS, LINDSAY C on: 01/14/2017 11:33 AM   Modules accepted: Orders

## 2017-01-14 NOTE — Patient Instructions (Signed)
We will initiate CPAP 10 cmH2O and utilize nasal pillows as your mask. Please continue Anoro once a day as you have been taking it Use albuterol as needed Flu shot up-to-date Follow with Dr Delton CoombesByrum in 3 months or sooner if you have any problems.

## 2017-01-14 NOTE — Assessment & Plan Note (Signed)
Treated for a bronchitis last month.  Clinically stable at this time with stable mucus production.  Continue Anoro, continue albuterol as needed.

## 2017-01-14 NOTE — Progress Notes (Signed)
Subjective:    Patient ID: [name], male    DOB: 08-26-1955, 61 y.o.   MRN: [id number]  HPI  01/14/2017  Chief Complaint  Patient presents with  . COPD   61 year old man former smoker, history of bipolar disorder and prior substance abuse. Has been evaluated for cough and for  Abnormal chest imaging. He underwent bronchoscopy with removal of a foreign body from the right lower lobe bronchus as mentioned above. He had grossly normal spirometry 06/12/13. 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  He reports today that he is having exertional SOB, especially with stairs, can make him panic. He has some wheezing, some cough that progresses thru the day. He has a repeat Ct scan chest and neck due for 01/29/15 ordered by Dr Tyrone SageGerhardt.                         ROV 08/24/16 -- patient has a history of COPD and chronic cough. At his last visit we changed Symbicort to Anoro. Also started him on loratadine. He believes that the Anoro helps him, as good or better than the Symbicort. He reports that he has been having more trouble over the last few weeks during the increased heat. He is also dealing with depression. He does not have an albuterol HFA to use prn. He does have daily cough is productive of clear mucous. He has not had any exacerbations, no prednisone.  Acute OV 10/.17/18 -- Patient has a history of COPD, chronic cough, depression. With left hemidiaphragm paralysis. He presents today an acute visit, has been experiencing . He has been working with Dr Durene CalHunter on BP management > on losartan titrating 50 to 100mg  for last 3 months, also started HCTZ but felt very drained and became hyponatremic so stopped 1 month ago. Has been on Fe for anemia. He feels very drained, wants to sleep, feels difficult to get a deep breath in. He has been coughing more for the last week, has been wheezing often at night. He has gained about 20 lbs over the last 2 years. He is on loratadine, Anoro, has not tried any  of his albuterol. He has a sleep study set for 12/30/16.   ROV 01/14/17 --this is a follow-up visit for patient with a history of COPD, depression, chronic cough diaphragmatic paralysis.  He underwent a sleep study on 12/30/16, shows moderate obstructive sleep apnea, AHI 27.48.  CPAP 10 cmH2O was recommended.  At his last visit I continued him on Anoro, treated him with azithromycin in the event that he had a bronchitis. He is using anoro, albuterol daily. He still has some mucous and cough.    Review of Systems As per HPI     Objective:   Physical Exam Vitals:   01/14/17 1054  BP: 130/90  Pulse: 72  SpO2: 97%  Weight: 255 lb (115.7 kg)  Height: 5\' 10"  (1.778 m)   Gen: Pleasant, Obese man, in no distress,  normal affect  ENT: No lesions,  mouth clear,  oropharynx clear, no postnasal drip  Neck: No JVD, no stridor  Lungs: No use of accessory muscles, no wheezing or crackles  Cardiovascular: RRR, heart sounds normal, no murmur or gallops, no edema  Musculoskeletal: No deformities, no cyanosis or clubbing  Neuro: alert, non focal  Skin: Warm, no lesions or rashes      Assessment & Plan:  COPD (chronic obstructive pulmonary disease) (HCC) Treated for a bronchitis last  month.  Clinically stable at this time with stable mucus production.  Continue Anoro, continue albuterol as needed.  OSA (obstructive sleep apnea) Confirmed by polysomnogram 12/30/16.  I will start him on CPAP 10 cmH2O.  We will try to use nasal pillows.  If he has leak then we will add a chinstrap.  I will follow with him in 3 months to ensure good compliance and benefit.  Levy Pupaobert Haydin Dunn, MD, PhD 01/14/2017, 11:29 AM [facility] [phone] or if no answer [phone]

## 2017-01-19 ENCOUNTER — Ambulatory Visit: Payer: BLUE CROSS/BLUE SHIELD | Admitting: Emergency Medicine

## 2017-01-19 ENCOUNTER — Encounter: Payer: Self-pay | Admitting: Emergency Medicine

## 2017-01-19 ENCOUNTER — Telehealth: Payer: Self-pay | Admitting: Emergency Medicine

## 2017-01-19 DIAGNOSIS — J441 Chronic obstructive pulmonary disease with (acute) exacerbation: Secondary | ICD-10-CM | POA: Diagnosis not present

## 2017-01-19 DIAGNOSIS — G4733 Obstructive sleep apnea (adult) (pediatric): Secondary | ICD-10-CM | POA: Diagnosis not present

## 2017-01-19 MED ORDER — DOXYCYCLINE HYCLATE 100 MG PO TABS: 100.0000 mg | ORAL_TABLET | Freq: Two times a day (BID) | ORAL | 0 refills | Status: DC

## 2017-01-19 MED ORDER — PREDNISONE 10 MG PO TABS
ORAL_TABLET | ORAL | 0 refills | Status: DC
Start: 2017-01-19 — End: 2017-01-29

## 2017-01-19 NOTE — Assessment & Plan Note (Signed)
Trigger unclear.  He does not have much in the way of viral symptoms.  He is clearly wheezing and needs to be treated for an acute exacerbation.  Prednisone and doxycycline will be ordered.  Continue his Anoro, and albuterol for symptom relief

## 2017-01-19 NOTE — Patient Instructions (Signed)
Please take prednisone taper as directed until completely gone.  Please take doxycycline until completely gone.  Please continue your Anoro daily Take albuterol 2 puffs up to every 4 hours if needed for shortness of breath.  We will clarify that your CPAP machine has been ordered.  Follow with Dr Delton CoombesByrum in February 2019 as planned

## 2017-01-19 NOTE — Assessment & Plan Note (Signed)
He has not yet received his CPAP machine we will work on clarifying that this has been ordered

## 2017-01-19 NOTE — Telephone Encounter (Signed)
Called and spoke with pt and he stated that for the last couple of days he has been wheezing and coughing with green sputum. He has been scheduled to see RB at 11 today. Pt is aware.

## 2017-01-19 NOTE — Addendum Note (Signed)
Addended by: Cornell BarmanWHITTAKER, KELLI M on: 01/19/2017 11:43 AM   Modules accepted: Orders

## 2017-01-19 NOTE — Progress Notes (Signed)
Subjective:    Patient ID: [name], male    DOB: [birth year], 61 y.o.   MRN: [id number]  HPI  01/19/2017  Chief Complaint  Patient presents with  . Acute Visit    Pt is not breathing well, has wheezing prod cough-green with SOB with exertion and has chest tightness.   61 year old man former smoker, history of bipolar disorder and prior substance abuse. Has been evaluated for cough and for  Abnormal chest imaging. He underwent bronchoscopy with removal of a foreign body from the right lower lobe bronchus as mentioned above. He had grossly normal spirometry 06/12/13. 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  He reports today that he is having exertional SOB, especially with stairs, can make him panic. He has some wheezing, some cough that progresses thru the day. He has a repeat Ct scan chest and neck due for 01/29/15 ordered by Dr Tyrone SageGerhardt.                         ROV 08/24/16 -- patient has a history of COPD and chronic cough. At his last visit we changed Symbicort to Anoro. Also started him on loratadine. He believes that the Anoro helps him, as good or better than the Symbicort. He reports that he has been having more trouble over the last few weeks during the increased heat. He is also dealing with depression. He does not have an albuterol HFA to use prn. He does have daily cough is productive of clear mucous. He has not had any exacerbations, no prednisone.  Acute OV 10/.17/18 -- Patient has a history of COPD, chronic cough, depression. With left hemidiaphragm paralysis. He presents today an acute visit, has been experiencing . He has been working with Dr Durene CalHunter on BP management > on losartan titrating 50 to 100mg  for last 3 months, also started HCTZ but felt very drained and became hyponatremic so stopped 1 month ago. Has been on Fe for anemia. He feels very drained, wants to sleep, feels difficult to get a deep breath in. He has been coughing more for the last week, has been wheezing  often at night. He has gained about 20 lbs over the last 2 years. He is on loratadine, Anoro, has not tried any of his albuterol. He has a sleep study set for 12/30/16.   ROV 01/14/17 --this is a follow-up visit for patient with a history of COPD, depression, chronic cough diaphragmatic paralysis.  He underwent a sleep study on 12/30/16, shows moderate obstructive sleep apnea, AHI 27.48.  CPAP 10 cmH2O was recommended.  At his last visit I continued him on Anoro, treated him with azithromycin in the event that he had a bronchitis. He is using anoro, albuterol daily. He still has some mucous and cough.   Acute OV 01/19/17 --patient has a history of COPD, depression, chronic cough, sleep apnea.  He presents today with cough and wheezing that began over the weekend, significant dyspnea with exertion and bending. No fever. Cough minimally productive. No other exposures.  He has not received his CPAP machine yet   Review of Systems As per HPI     Objective:   Physical Exam Vitals:   01/19/17 1111  BP: 118/84  Pulse: 96  SpO2: 95%  Weight: 255 lb (115.7 kg)  Height: 5\' 10"  (1.778 m)   Gen: Pleasant, Obese man, in no distress,  normal affect, coughing occasionally  ENT: No lesions,  mouth clear,  oropharynx clear, no postnasal drip  Neck: No JVD, no stridor  Lungs: No use of accessory muscles, bilateral expiratory wheezes in all fields  Cardiovascular: RRR, heart sounds normal, no murmur or gallops, no edema  Musculoskeletal: No deformities, no cyanosis or clubbing  Neuro: alert, non focal  Skin: Warm, no lesions or rashes      Assessment & Plan:  COPD with acute exacerbation (HCC) Trigger unclear.  He does not have much in the way of viral symptoms.  He is clearly wheezing and needs to be treated for an acute exacerbation.  Prednisone and doxycycline will be ordered.  Continue his Anoro, and albuterol for symptom relief  OSA (obstructive sleep apnea) He has not yet received his  CPAP machine we will work on clarifying that this has been ordered  Zachary Pupaobert Tamar Miano, MD, PhD 01/19/2017, 11:38 AM  Pulmonary and Critical Care [phone] or if no answer [phone]

## 2017-01-29 ENCOUNTER — Ambulatory Visit (INDEPENDENT_AMBULATORY_CARE_PROVIDER_SITE_OTHER)
Admission: RE | Admit: 2017-01-29 | Discharge: 2017-01-29 | Disposition: A | Discharge status: Home / Self Care | Payer: BLUE CROSS/BLUE SHIELD | Source: Ambulatory Visit | Attending: Emergency Medicine | Admitting: Emergency Medicine

## 2017-01-29 ENCOUNTER — Encounter: Payer: Self-pay | Admitting: Emergency Medicine

## 2017-01-29 ENCOUNTER — Ambulatory Visit: Payer: BLUE CROSS/BLUE SHIELD | Admitting: Emergency Medicine

## 2017-01-29 ENCOUNTER — Telehealth: Payer: Self-pay | Admitting: Emergency Medicine

## 2017-01-29 VITALS — BP 134/68 | HR 82 | Ht 70.0 in | Wt 273.4 lb

## 2017-01-29 DIAGNOSIS — R059 Cough, unspecified: Secondary | ICD-10-CM

## 2017-01-29 DIAGNOSIS — R05 Cough: Secondary | ICD-10-CM

## 2017-01-29 DIAGNOSIS — G4733 Obstructive sleep apnea (adult) (pediatric): Secondary | ICD-10-CM | POA: Diagnosis not present

## 2017-01-29 MED ORDER — LEVOFLOXACIN 750 MG PO TABS: 750.0000 mg | ORAL_TABLET | Freq: Every day | ORAL | 0 refills | Status: DC

## 2017-01-29 MED ORDER — HYDROCODONE-HOMATROPINE 5-1.5 MG/5ML PO SYRP: 5.0000 mL | ORAL_SOLUTION | Freq: Four times a day (QID) | ORAL | 0 refills | Status: DC | PRN

## 2017-01-29 NOTE — Assessment & Plan Note (Signed)
COPD exacerbation that did not fully resolve and which has persistent symptoms following prednisone and doxycycline.  Unclear to me why he did not improve but I question whether he may be having occult aspiration.  I will evaluate for a pneumonia, treat him again for an acute flare.  Continue his Anoro and albuterol as ordered.  Use Hycodan for cough suppression.  If this pattern persists then I believe he needs a formal swallowing evaluation  CXR today Please take levaquin 750 mg once a day for 7 days Please take prednisone as directed until completely gone Continue Anoro once a day Keep albuterol available to use up to every 4 hours if needed for shortness of breath, wheezing, chest tightness Use Hycodan 5 cc up to every 6 hours if needed for cough suppression Once this illness is improved we may decide to perform a swallowing evaluation to make sure that you are not experiencing any silent aspiration leading to recurrent flares and pneumonia. Follow with Dr Delton CoombesByrum in February as planned

## 2017-01-29 NOTE — Telephone Encounter (Signed)
lmtcb X1 for pt  

## 2017-01-29 NOTE — Patient Instructions (Signed)
CXR today Please take levaquin 750 mg once a day for 7 days Please take prednisone as directed until completely gone Continue Anoro once a day Keep albuterol available to use up to every 4 hours if needed for shortness of breath, wheezing, chest tightness Use Hycodan 5 cc up to every 6 hours if needed for cough suppression Once this illness is improved we may decide to perform a swallowing evaluation to make sure that you are not experiencing any silent aspiration leading to recurrent flares and pneumonia. Follow with Dr Delton CoombesByrum in February as planned

## 2017-01-29 NOTE — Assessment & Plan Note (Signed)
Achieved his CPAP and is working on using it.  Some difficulty tolerating it during this acute illness but he is motivated to continue.  Encouraged him to do so

## 2017-01-29 NOTE — Telephone Encounter (Signed)
Spoke with Synetta Failnita Richardson(wife)-states patient needs to be worked in today and can not take him to the ED as that will take too long.   [phone] Wife to patient.   Wife demands patient to be seen today and will send patient here to wait until someone sees him Please advise ASAP RB. Thanks.

## 2017-01-29 NOTE — Telephone Encounter (Signed)
Spoke with Alcario DroughtErica and advised her that MW will see him today at 3:45pm. Nothing further is needed.

## 2017-01-29 NOTE — Telephone Encounter (Signed)
Patients returning call  

## 2017-01-29 NOTE — Progress Notes (Signed)
Subjective:    Patient ID: Michaelyn BarterJohn B Pesnell, male    DOB: [birth year], 61 y.o.   MRN: [id number]  HPI  01/29/2017  Chief Complaint  Patient presents with  . Acute Visit    sob, chest discomfort, wheezing, cough with green sputum                         ROV 01/14/17 --this is a follow-up visit for patient with a history of COPD, depression, chronic cough diaphragmatic paralysis.  He underwent a sleep study on 12/30/16, shows moderate obstructive sleep apnea, AHI 27.48.  CPAP 10 cmH2O was recommended.  At his last visit I continued him on Anoro, treated him with azithromycin in the event that he had a bronchitis. He is using anoro, albuterol daily. He still has some mucous and cough.   Acute OV 01/19/17 --patient has a history of COPD, depression, chronic cough, sleep apnea.  He presents today with cough and wheezing that began over the weekend, significant dyspnea with exertion and bending. No fever. Cough minimally productive. No other exposures.  He has not received his CPAP machine yet  Acute OV 01/29/17 --61 year old man who presents for an acute visit.  He has a history of COPD, depression, chronic cough and obstructive sleep apnea.  I saw him 10 days ago and treated him with prednisone and doxycycline for a presumed acute exacerbation of his COPD.  He reports that he was better on the prednisone, improved breathing and less wheeze, but did not resolve completely. Finished the pred earlier this week. He is now having more cough, prod of some white/green, bilateral chest discomfort with the cough. Poor sleep. He is on Anoro, using albuterol approximately bid. He may have had some dysphagia, he is unsure - he has a hx of aspiration and retrieval of foreign body.  He started CPAP earlier this week - difficult to wear due to the cough, but he is going to work on compliance.    Review of Systems As per HPI     Objective:   Physical Exam Vitals:   01/29/17 1544  BP: 134/68  Pulse: 82  SpO2:  94%  Weight: 273 lb 6.4 oz (124 kg)  Height: 5\' 10"  (1.778 m)   Gen: Pleasant, Obese man, in no distress,  normal affect, coughing occasionally  ENT: No lesions,  mouth clear,  oropharynx clear, no postnasal drip  Neck: No JVD, no stridor  Lungs: No use of accessory muscles, bilateral expiratory wheezes in all fields  Cardiovascular: RRR, heart sounds normal, no murmur or gallops, no edema  Musculoskeletal: No deformities, no cyanosis or clubbing  Neuro: alert, non focal  Skin: Warm, no lesions or rashes      Assessment & Plan:  COPD with acute exacerbation (HCC) COPD exacerbation that did not fully resolve and which has persistent symptoms following prednisone and doxycycline.  Unclear to me why he did not improve but I question whether he may be having occult aspiration.  I will evaluate for a pneumonia, treat him again for an acute flare.  Continue his Anoro and albuterol as ordered.  Use Hycodan for cough suppression.  If this pattern persists then I believe he needs a formal swallowing evaluation  CXR today Please take levaquin 750 mg once a day for 7 days Please take prednisone as directed until completely gone Continue Anoro once a day Keep albuterol available to use up to every 4 hours if needed for shortness  of breath, wheezing, chest tightness Use Hycodan 5 cc up to every 6 hours if needed for cough suppression Once this illness is improved we may decide to perform a swallowing evaluation to make sure that you are not experiencing any silent aspiration leading to recurrent flares and pneumonia. Follow with Dr Delton CoombesByrum in February as planned  Levy Pupaobert Collette Pescador, MD, PhD 01/29/2017, 4:03 PM [facility] [phone] or if no answer [phone]

## 2017-01-29 NOTE — Telephone Encounter (Signed)
Spoke with patient. He was seen by RB on 12/4 and was prescribed a prednisone taper and round of Doxy. He has finished these medications, but is still not feeling well.   He has a productive cough with green mucus. Increased wheezing and SOB. Fatigued. He has a history of PNA and is concerned that he has PNA again.   Wishes to use [facility].   RB, please advise. Thanks!

## 2017-02-02 ENCOUNTER — Telehealth: Payer: Self-pay | Admitting: Emergency Medicine

## 2017-02-02 ENCOUNTER — Ambulatory Visit: Payer: BLUE CROSS/BLUE SHIELD | Admitting: Internal Medicine

## 2017-02-02 NOTE — Telephone Encounter (Addendum)
Spoke with pt's wife and advised her that the xray looks normal. She thinks he needs a cough medication so I advised her to get some Delsym for the cough because he does not want any narcotic medication. She also notices that after he started  the CPAP machine, possibly causing him to cough more or cause infection.  Awaiting RB response.

## 2017-02-02 NOTE — Telephone Encounter (Signed)
Pt doesn't have any prednisone left. Pt is going to give him Deslyn that you recomended [phone] or [phone]

## 2017-02-02 NOTE — Telephone Encounter (Signed)
ATC pt, no answer. Left message for pt to call back.  RB can you advise on CXR results?

## 2017-02-02 NOTE — Telephone Encounter (Signed)
Patient's wife, Carney Cornersnita Richardson, is calling for CXR results.  I have advised her that the message has been sent to RB who is at the hospital and the patient will be called once we have the results.  She is upset it taking this long stating the patient is "very sick" and asks for nurse to call her at [phone].

## 2017-02-02 NOTE — Telephone Encounter (Signed)
Spoke with pt, he states he did not have any Prednisone? Did you want to send in more for pt? Please clarify   CXR today Please take levaquin 750 mg once a day for 7 days Please take prednisone as directed until completely gone Continue Anoro once a day Keep albuterol available to use up to every 4 hours if needed for shortness of breath, wheezing, chest tightness Use Hycodan 5 cc up to every 6 hours if needed for cough suppression Once this illness is improved we may decide to perform a swallowing evaluation to make sure that you are not experiencing any silent aspiration leading to recurrent flares and pneumonia. Follow with Dr Delton CoombesByrum in February as planned

## 2017-02-03 MED ORDER — PREDNISONE 10 MG PO TABS: ORAL_TABLET | ORAL | 0 refills | Status: DC

## 2017-02-03 NOTE — Telephone Encounter (Signed)
rx called in to preferred pharmacy.  Pt's spouse aware.  Nothing further needed.  

## 2017-02-03 NOTE — Telephone Encounter (Signed)
I saw him 01/29/17 and intended for him to take pred taper > Take 40mg  daily for 3 days, then 30mg  daily for 3 days, then 20mg  daily for 3 days, then 10mg  daily for 3 days, then stop

## 2017-02-22 ENCOUNTER — Telehealth: Payer: Self-pay | Admitting: Emergency Medicine

## 2017-02-22 MED ORDER — HYDROCODONE-HOMATROPINE 5-1.5 MG/5ML PO SYRP: 5.0000 mL | ORAL_SOLUTION | Freq: Four times a day (QID) | ORAL | 0 refills | Status: DC | PRN

## 2017-02-22 NOTE — Telephone Encounter (Signed)
Spoke with patient. He is aware of RB's recs. RX has printed and waiting on RB's signature.   Patient will stop by the office this afternoon to pick up his RX.

## 2017-02-22 NOTE — Telephone Encounter (Signed)
lmtcb x1 for pt. 

## 2017-02-22 NOTE — Telephone Encounter (Signed)
Called pt who stated he has had the cough for a while. States that his cough gets worse as the day goes on and occ will cough up clear to white phlegm.  Pt stated that when he was on the prednisone, that helped with his cough and also when he was on the Hycodan cough syrup, it helped to ease up the cough.  Pt not currently on pred or hycodan.  Pt is wondering if there is anything we can send in for him to help with his cough as he is having difficulty getting sleep due to cough.  Dr. Delton CoombesByrum, please advise. Thanks!

## 2017-02-22 NOTE — Telephone Encounter (Signed)
Ok to refill the hycodan. I would prefer to hold off on pred for now.

## 2017-02-22 NOTE — Telephone Encounter (Signed)
Pt is calling back [phone]

## 2017-02-24 ENCOUNTER — Telehealth: Payer: Self-pay | Admitting: Emergency Medicine

## 2017-02-24 NOTE — Telephone Encounter (Signed)
Spoke with pt, scheduled to see SG tomorrow morning.  Nothing further needed.

## 2017-02-24 NOTE — Telephone Encounter (Signed)
Pt c/o unchanged dyspnea, wheezing, prod cough with yellow/white mucus despite starting to take hycodan cough syrup prescribed X2 days ago.  Pt not taking anything else to help with s/s.    Denies chest pain, fever, chills, body aches.  Pt requesting additional recs.    Pt uses [facility]  RB please advise on recs.  Thanks.

## 2017-02-24 NOTE — Telephone Encounter (Signed)
Unclear how to address this by phone. He needs to be seen. ? Needs to add empiric GERD or rhinitis medication. ? Acute flare COPD.

## 2017-02-25 ENCOUNTER — Ambulatory Visit: Payer: BLUE CROSS/BLUE SHIELD | Admitting: Acute Care

## 2017-02-25 ENCOUNTER — Encounter: Payer: Self-pay | Admitting: Acute Care

## 2017-02-25 VITALS — BP 140/80 | HR 87 | Ht 70.0 in | Wt 282.8 lb

## 2017-02-25 DIAGNOSIS — R059 Cough, unspecified: Secondary | ICD-10-CM

## 2017-02-25 DIAGNOSIS — R05 Cough: Secondary | ICD-10-CM

## 2017-02-25 DIAGNOSIS — R053 Chronic cough: Secondary | ICD-10-CM

## 2017-02-25 DIAGNOSIS — R0602 Shortness of breath: Secondary | ICD-10-CM | POA: Diagnosis not present

## 2017-02-25 DIAGNOSIS — G4733 Obstructive sleep apnea (adult) (pediatric): Secondary | ICD-10-CM

## 2017-02-25 DIAGNOSIS — J441 Chronic obstructive pulmonary disease with (acute) exacerbation: Secondary | ICD-10-CM

## 2017-02-25 LAB — NITRIC OXIDE: Nitric Oxide: 179

## 2017-02-25 MED ORDER — ALBUTEROL SULFATE (2.5 MG/3ML) 0.083% IN NEBU: 2.5000 mg | INHALATION_SOLUTION | Freq: Four times a day (QID) | RESPIRATORY_TRACT | 12 refills | Status: DC | PRN

## 2017-02-25 MED ORDER — DEXTROMETHORPHAN POLISTIREX ER 30 MG/5ML PO SUER: 30.0000 mg | Freq: Two times a day (BID) | ORAL | Status: DC | PRN

## 2017-02-25 MED ORDER — FAMOTIDINE 20 MG PO TABS: 20.0000 mg | ORAL_TABLET | Freq: Every day | ORAL | 0 refills | Status: DC

## 2017-02-25 MED ORDER — PREDNISONE 10 MG PO TABS: ORAL_TABLET | ORAL | 0 refills | Status: DC

## 2017-02-25 MED ORDER — FLUTICASONE-UMECLIDIN-VILANT 100-62.5-25 MCG/INH IN AEPB: 1.0000 | INHALATION_SPRAY | Freq: Every day | RESPIRATORY_TRACT | 3 refills | Status: DC

## 2017-02-25 MED ORDER — FLUTICASONE-UMECLIDIN-VILANT 100-62.5-25 MCG/INH IN AEPB: 1.0000 | INHALATION_SPRAY | Freq: Every day | RESPIRATORY_TRACT | 0 refills | Status: DC

## 2017-02-25 MED ORDER — OMEPRAZOLE 20 MG PO CPDR
20.0000 mg | DELAYED_RELEASE_CAPSULE | Freq: Every day | ORAL | 11 refills | Status: DC
Start: 2017-02-25 — End: 2017-03-11

## 2017-02-25 MED ORDER — LEVALBUTEROL HCL 0.63 MG/3ML IN NEBU
0.6300 mg | INHALATION_SOLUTION | Freq: Once | RESPIRATORY_TRACT | Status: AC
  Administered 2017-02-25: 0.63 mg via RESPIRATORY_TRACT

## 2017-02-25 MED ORDER — HYDROCODONE-HOMATROPINE 5-1.5 MG/5ML PO SYRP: 5.0000 mL | ORAL_SOLUTION | Freq: Three times a day (TID) | ORAL | 0 refills | Status: DC | PRN

## 2017-02-25 NOTE — Assessment & Plan Note (Signed)
Unable to wear during exacerbation Wife concerned cold humidity is making COPD exacerbations worse. Plan: Re-evaluate after current exacerbation

## 2017-02-25 NOTE — Patient Instructions (Addendum)
It is nice to meet you today. FENO now>> 179 Prednisone taper; 10 mg tablets: 4 tabs x 2 days, 3 tabs x 2 days, 2 tabs x 2 days 1 tab x 2 days then stop. We will have [facility] evaluate your machine for heated humidity. We will schedule a CT Chest We will order a neb machine We will prescribe albuterol nebs for shortness of breath or wheezing that is breakthrough. Trelegy 1 puff once daily>> Stop Anoro while on this. Spacer for use with Albuterol inhaler We will instruct you on use. Rinse mouth after use Omeprazole  20 mg daily for  Reflux. Pepcid 20 mg.at bedtime Delsym every 12 hours for cough Hydromet cough syrup 5 cc's every 8 hours as needed for cough GERD diet No throat clearing , sips of water instead Throat soothing with Sugar free jolly ranchers Sips of water instead of throat clearing Throat soothing with sugar free jolly ranchers  Follow up with Maralyn SagoSarah NP in 2 weeks  Follow up with Dr. Delton CoombesByrum for CT results at his first available Please contact office for sooner follow up if symptoms do not improve or worsen or seek emergency care

## 2017-02-25 NOTE — Assessment & Plan Note (Addendum)
Slow to resolve  Tx with Doxy and Levaquin over last month Plan: FENO now>> 179 Prednisone taper; 10 mg tablets: 4 tabs x 2 days, 3 tabs x 2 days, 2 tabs x 2 days 1 tab x 2 days then stop. We will schedule a CT Chest We will order a neb machine We will prescribe albuterol nebs for shortness of breath or wheezing that is breakthrough up to three times daily Trelegy 1 puff once daily>> Stop Anoro while on this. Spacer for use with Albuterol inhaler We will instruct you on use. Rinse mouth after use Delsym every 12 hours for cough Hydromet cough syrup 5 cc's every 8 hours as needed for cough GERD diet No throat clearing , sips of water instead Throat soothing with Sugar free jolly ranchers Follow up with Maralyn SagoSarah NP in 2 weeks  Follow up with Dr. Delton CoombesByrum for CT results at his first available Please contact office for sooner follow up if symptoms do not improve or worsen or seek emergency care  Consider adding Daliresp

## 2017-02-25 NOTE — Progress Notes (Signed)
History of Present Illness [name] is a 62 y.o. male with history of COPD, depression, chronic cough diaphragmatic paralysis and OSA on CPAP..    02/25/2017 Acute OV: Pt present for acute OV. He had a recent slow to resolve exacerbation of his COPD 01/19/2017. He was treated at that time with Doxy and pred taper and upon follow up visit 01/29/17 with another acute flare. Dr. Delton Coombes was concerned about aspiration. He was treated with Levaquin 500 and another prednisone taper.CXR on 12/14 was negative for pneumonia. Pt. called the office 02/22/2017 with complaints of cough. He was phoned in a RX for hycodan. He is here today with continued cough. Pt. States he did complete his prednisone tapers.He states that the cough does improve on the prednisone.He states he is compliant with his Anoro daily. He states he is using his rescue inhaler twice daily for breakthrough shortness of breath. Pt. Wife feels the patient's respiratory status is worse with CPAP use. She is concerned about the cold humidity. Pt. States his cough is productive at times for mostly clear  secretions. He denies fever, but his wife states he does have occasional night sweats.No Chest pain, orthopnea of hemoptysis..    Test Results: CXR 01/29/2017>> IMPRESSION: No acute cardiopulmonary abnormality.  12/30/16, shows moderate obstructive sleep apnea, AHI 27.48.  CPAP 10 cmH2O was recommended.  CBC Latest Ref Rng & Units 12/21/2016 11/12/2016 11/05/2016  WBC 4.0 - 10.5 K/uL 6.9 6.4 7.3  Hemoglobin 13.0 - 17.0 g/dL 16.1 12.3(L) 12.8(L)  Hematocrit 39.0 - 52.0 % 42.2 37.6(L) 38.9(L)  Platelets 150.0 - 400.0 K/uL 268.0 249.0 293.0    BMP Latest Ref Rng & Units 11/12/2016 11/05/2016 10/13/2016  Glucose 70 - 99 mg/dL 096(E) 98 97  BUN 6 - 23 mg/dL 12 15 13   Creatinine 0.40 - 1.50 mg/dL 4.54 0.98 1.19  Sodium 135 - 145 mEq/L 131(L) 125(L) 127(L)  Potassium 3.5 - 5.1 mEq/L 4.3 4.3 4.5  Chloride 96 - 112 mEq/L 99 92(L) 92(L)  CO2 19 -  32 mEq/L 25 27 32  Calcium 8.4 - 10.5 mg/dL 8.9 9.2 9.4    PFT    Component Value Date/Time   FEV1PRE 2.66 05/23/2015 1454   FEV1POST 3.11 05/23/2015 1454   FVCPRE 3.42 05/23/2015 1454   FVCPOST 3.98 05/23/2015 1454   DLCOUNC 32.56 05/23/2015 1454   PREFEV1FVCRT 78 05/23/2015 1454   PSTFEV1FVCRT 78 05/23/2015 1454    Dg Chest 2 View  Result Date: 01/29/2017 CLINICAL DATA:  62 year old male with cough congestion wheezing and shortness of breath for 1 month. EXAM: CHEST  2 VIEW COMPARISON:  09/26/2015 and earlier. FINDINGS: Stable lung volumes with mild eventration of the diaphragm. Normal cardiac size and mediastinal contours. Visualized tracheal air column is within normal limits. No pneumothorax, pulmonary edema, pleural effusion or confluent pulmonary opacity. No acute osseous abnormality identified. Stable visible bowel gas pattern. IMPRESSION: No acute cardiopulmonary abnormality. Electronically Signed   By: Odessa Fleming M.D.   On: 01/29/2017 17:02     Past medical hx Past Medical History:  Diagnosis Date  . ADD (attention deficit disorder)   . Anxiety    Panic  . Bipolar disorder (HCC)   . GERD (gastroesophageal reflux disease)    ? they thought GERD caused cough.   . Mental disorder    ADD, bipolar  . Pneumonia 04/12/2013  . Shortness of breath   . Substance abuse (HCC)      Social History   Tobacco Use  .  Smoking status: Former Smoker    Packs/day: 1.00    Years: 20.00    Pack years: 20.00    Types: Cigarettes    Last attempt to quit: 02/16/1993    Years since quitting: 24.0  . Smokeless tobacco: Never Used  Substance Use Topics  . Alcohol use: No    Comment: none at all  . Drug use: No    [name] reports that he quit smoking about 24 years ago. His smoking use included cigarettes. He has a 20.00 pack-year smoking history. he has never used smokeless tobacco. He reports that he does not drink alcohol or use drugs.  Tobacco Cessation: Former smoker Quit  1995  Past surgical hx, Family hx, Social hx all reviewed.  Current Outpatient Medications on File Prior to Visit  Medication Sig  . ANORO ELLIPTA 62.5-25 MCG/INH AEPB USE 1 PUFF DAILY AS DIRECTED  . ARIPiprazole (ABILIFY) 10 MG tablet Take 5 mg by mouth daily.   . ferrous sulfate 325 (65 FE) MG tablet Take 325 mg by mouth daily with breakfast.  . FLUoxetine (PROZAC) 20 MG tablet Take 40 mg by mouth daily.   Marland Kitchen. gabapentin (NEURONTIN) 800 MG tablet Take 1 tablet by mouth 3 (three) times daily.   Marland Kitchen. HYDROcodone-homatropine (HYCODAN) 5-1.5 MG/5ML syrup Take 5 mLs by mouth every 6 (six) hours as needed for cough.  . hydrOXYzine (VISTARIL) 50 MG capsule 25 mg 4 (four) times daily as needed.   Marland Kitchen. losartan (COZAAR) 100 MG tablet Take 1 tablet (100 mg total) by mouth daily.  . Melatonin 5 MG LOZG Place 5 mg under the tongue at bedtime.  . naproxen (NAPROSYN) 500 MG tablet Take 500 mg by mouth 3 (three) times daily with meals.  . traZODone (DESYREL) 100 MG tablet Take 50 mg by mouth at bedtime.   . triamcinolone ointment (KENALOG) 0.5 % Apply 1 application topically 2 (two) times daily. 7 days maximum. Need at least 10 day break after that.   No current facility-administered medications on file prior to visit.      No Known Allergies  Review Of Systems:  Constitutional:   No  weight loss, night sweats,  Fevers, + chills, fatigue, or  lassitude.  HEENT:   No headaches,  Difficulty swallowing,  Tooth/dental problems, or  Sore throat,                No sneezing, itching, ear ache, nasal congestion,+  post nasal drip,   CV:  No chest pain,  Orthopnea, PND, swelling in lower extremities, anasarca, dizziness, palpitations, syncope.   GI  No heartburn, indigestion, abdominal pain, nausea, vomiting, diarrhea, change in bowel habits, loss of appetite, bloody stools.   Resp: + shortness of breath with exertion less at rest.  + excess mucus, no productive cough,  + non-productive cough,  No coughing up  of blood.  No change in color of mucus.  + wheezing.  No chest wall deformity  Skin: no rash or lesions.  GU: no dysuria, change in color of urine, no urgency or frequency.  No flank pain, no hematuria   MS:  No joint pain or swelling.  No decreased range of motion.  No back pain.  Psych:  No change in mood or affect. No depression or anxiety.  No memory loss.   Vital Signs BP 140/80 (BP Location: Left Arm, Cuff Size: Normal)   Pulse 87   Ht 5\' 10"  (1.778 m)   Wt 282 lb 12.8 oz (128.3 kg)  SpO2 95%   BMI 40.58 kg/m    Physical Exam:  General- No distress,  A&Ox3,overweight male, with cough ENT: No sinus tenderness, TM clear, pale nasal mucosa, no oral exudate,+ post nasal drip, no LAN Cardiac: S1, S2, regular rate and rhythm, no murmur Chest: + wheeze/No rales/ dullness; no accessory muscle use, no nasal flaring, no sternal retractions, + rhonchi Abd.: Soft Non-tender, non-distended, obese Ext: No clubbing cyanosis, edema Neuro:  normal strength, MAE x 4, A&Ox3 Skin: No rashes, warm and dry Psych: normal mood and behavior   Assessment/Plan  COPD with acute exacerbation (HCC) Slow to resolve  Tx with Doxy and Levaquin over last month Plan: FENO now>> 179 Prednisone taper; 10 mg tablets: 4 tabs x 2 days, 3 tabs x 2 days, 2 tabs x 2 days 1 tab x 2 days then stop. We will schedule a CT Chest We will order a neb machine We will prescribe albuterol nebs for shortness of breath or wheezing that is breakthrough up to three times daily Trelegy 1 puff once daily>> Stop Anoro while on this. Spacer for use with Albuterol inhaler We will instruct you on use. Rinse mouth after use Delsym every 12 hours for cough Hydromet cough syrup 5 cc's every 8 hours as needed for cough GERD diet No throat clearing , sips of water instead Throat soothing with Sugar free jolly ranchers Follow up with Maralyn Sago NP in 2 weeks  Follow up with Dr. Delton Coombes for CT results at his first  available Please contact office for sooner follow up if symptoms do not improve or worsen or seek emergency care     OSA (obstructive sleep apnea) Unable to wear during exacerbation Wife concerned cold humidity is making COPD exacerbations worse. Plan: Re-evaluate after current exacerbation  Chronic cough Worse with exacerbation Plan: FENO now>> 179 Prednisone taper; 10 mg tablets: 4 tabs x 2 days, 3 tabs x 2 days, 2 tabs x 2 days 1 tab x 2 days then stop. Spacer for use with Albuterol inhaler We will instruct you on use. Rinse mouth after use Omeprazole  20 mg daily for  Reflux. Pepcid 20 mg.at bedtime Delsym every 12 hours for cough Hydromet cough syrup 5 cc's every 8 hours as needed for cough GERD diet No throat clearing , sips of water instead Throat soothing with Sugar free jolly ranchers Sips of water instead of throat clearing Throat soothing with sugar free jolly ranchers  Follow up with Maralyn Sago NP in 2 weeks  Follow up with Dr. Delton Coombes for CT results at his first available Please contact office for sooner follow up if symptoms do not improve or worsen or seek emergency care      Bevelyn Ngo, NP 02/25/2017  10:31 PM

## 2017-02-25 NOTE — Assessment & Plan Note (Signed)
Worse with exacerbation Plan: FENO now>> 179 Prednisone taper; 10 mg tablets: 4 tabs x 2 days, 3 tabs x 2 days, 2 tabs x 2 days 1 tab x 2 days then stop. Spacer for use with Albuterol inhaler We will instruct you on use. Rinse mouth after use Omeprazole  20 mg daily for  Reflux. Pepcid 20 mg.at bedtime Delsym every 12 hours for cough Hydromet cough syrup 5 cc's every 8 hours as needed for cough GERD diet No throat clearing , sips of water instead Throat soothing with Sugar free jolly ranchers Sips of water instead of throat clearing Throat soothing with sugar free jolly ranchers  Follow up with Maralyn SagoSarah NP in 2 weeks  Follow up with Dr. Delton CoombesByrum for CT results at his first available Please contact office for sooner follow up if symptoms do not improve or worsen or seek emergency care

## 2017-03-02 ENCOUNTER — Ambulatory Visit (INDEPENDENT_AMBULATORY_CARE_PROVIDER_SITE_OTHER)
Admission: RE | Admit: 2017-03-02 | Discharge: 2017-03-02 | Disposition: A | Discharge status: Home / Self Care | Payer: BLUE CROSS/BLUE SHIELD | Source: Ambulatory Visit | Attending: Acute Care | Admitting: Acute Care

## 2017-03-02 DIAGNOSIS — R0602 Shortness of breath: Secondary | ICD-10-CM | POA: Diagnosis not present

## 2017-03-05 ENCOUNTER — Telehealth: Payer: Self-pay | Admitting: Emergency Medicine

## 2017-03-05 MED ORDER — FLUTICASONE-UMECLIDIN-VILANT 100-62.5-25 MCG/INH IN AEPB: 1.0000 | INHALATION_SPRAY | Freq: Every day | RESPIRATORY_TRACT | 0 refills | Status: DC

## 2017-03-05 NOTE — Telephone Encounter (Signed)
Spoke with pt. States that he took albuterol alone. He is aware of RB's response. While speaking to the pt, he wanted samples of Trelegy. Samples have been placed up front for pick up. Nothing further was needed.

## 2017-03-05 NOTE — Telephone Encounter (Signed)
Spoke with pt. States that he took a nebulizer treatment yesterday and had a "reaction." Reports uncontrollable shaking and feeling of being "frozen."  He has taken albuterol neb solution before this occurrence. Pt would like RB's recommendations.  RB - please advise. Thanks.

## 2017-03-05 NOTE — Telephone Encounter (Signed)
Pt's wife requesting CT results.   RB please advise on 1/15 CT.  Thanks.

## 2017-03-05 NOTE — Telephone Encounter (Signed)
Spoke with pt. States that he is now coughing up blood and feels he is going to faint. Advised that since it's 4:28pm on a Friday afternoon, he needs to seek medical care at an ER or an urgent care. He verbalized understanding. Nothing further was needed.

## 2017-03-05 NOTE — Telephone Encounter (Signed)
What med? Albuterol alone? Please confirm Some shakiness can be seen w albutero or atrovent, but not common to see MS changes, memory deficit, etc. If he has sx like these then he needs to seek care, go to ED, etc

## 2017-03-07 ENCOUNTER — Inpatient Hospital Stay (HOSPITAL_COMMUNITY)
Admission: EM | Admit: 2017-03-07 | Discharge: 2017-03-11 | DRG: 194 | Disposition: A | Discharge status: Home / Self Care | Payer: BLUE CROSS/BLUE SHIELD | Attending: Internal Medicine | Admitting: Internal Medicine

## 2017-03-07 ENCOUNTER — Encounter (HOSPITAL_COMMUNITY): Payer: Self-pay | Admitting: Emergency Medicine

## 2017-03-07 ENCOUNTER — Emergency Department (HOSPITAL_COMMUNITY): Payer: BLUE CROSS/BLUE SHIELD

## 2017-03-07 DIAGNOSIS — J159 Unspecified bacterial pneumonia: Principal | ICD-10-CM | POA: Diagnosis present

## 2017-03-07 DIAGNOSIS — R053 Chronic cough: Secondary | ICD-10-CM | POA: Diagnosis present

## 2017-03-07 DIAGNOSIS — Z9889 Other specified postprocedural states: Secondary | ICD-10-CM

## 2017-03-07 DIAGNOSIS — J189 Pneumonia, unspecified organism: Secondary | ICD-10-CM | POA: Diagnosis not present

## 2017-03-07 DIAGNOSIS — Z79899 Other long term (current) drug therapy: Secondary | ICD-10-CM

## 2017-03-07 DIAGNOSIS — T17908A Unspecified foreign body in respiratory tract, part unspecified causing other injury, initial encounter: Secondary | ICD-10-CM

## 2017-03-07 DIAGNOSIS — J441 Chronic obstructive pulmonary disease with (acute) exacerbation: Secondary | ICD-10-CM | POA: Diagnosis present

## 2017-03-07 DIAGNOSIS — Y95 Nosocomial condition: Secondary | ICD-10-CM | POA: Diagnosis present

## 2017-03-07 DIAGNOSIS — Z8659 Personal history of other mental and behavioral disorders: Secondary | ICD-10-CM

## 2017-03-07 DIAGNOSIS — K219 Gastro-esophageal reflux disease without esophagitis: Secondary | ICD-10-CM | POA: Diagnosis present

## 2017-03-07 DIAGNOSIS — J986 Disorders of diaphragm: Secondary | ICD-10-CM | POA: Diagnosis present

## 2017-03-07 DIAGNOSIS — J440 Chronic obstructive pulmonary disease with acute lower respiratory infection: Secondary | ICD-10-CM | POA: Diagnosis present

## 2017-03-07 DIAGNOSIS — R0902 Hypoxemia: Secondary | ICD-10-CM | POA: Diagnosis present

## 2017-03-07 DIAGNOSIS — D509 Iron deficiency anemia, unspecified: Secondary | ICD-10-CM | POA: Diagnosis present

## 2017-03-07 DIAGNOSIS — G4733 Obstructive sleep apnea (adult) (pediatric): Secondary | ICD-10-CM | POA: Diagnosis present

## 2017-03-07 DIAGNOSIS — F319 Bipolar disorder, unspecified: Secondary | ICD-10-CM | POA: Diagnosis present

## 2017-03-07 DIAGNOSIS — F410 Panic disorder [episodic paroxysmal anxiety] without agoraphobia: Secondary | ICD-10-CM | POA: Diagnosis present

## 2017-03-07 DIAGNOSIS — Z888 Allergy status to other drugs, medicaments and biological substances status: Secondary | ICD-10-CM

## 2017-03-07 DIAGNOSIS — R042 Hemoptysis: Secondary | ICD-10-CM | POA: Diagnosis present

## 2017-03-07 DIAGNOSIS — I110 Hypertensive heart disease with heart failure: Secondary | ICD-10-CM | POA: Diagnosis present

## 2017-03-07 DIAGNOSIS — Z23 Encounter for immunization: Secondary | ICD-10-CM

## 2017-03-07 DIAGNOSIS — Z87891 Personal history of nicotine dependence: Secondary | ICD-10-CM

## 2017-03-07 DIAGNOSIS — R1310 Dysphagia, unspecified: Secondary | ICD-10-CM | POA: Diagnosis present

## 2017-03-07 DIAGNOSIS — Z7951 Long term (current) use of inhaled steroids: Secondary | ICD-10-CM

## 2017-03-07 DIAGNOSIS — R05 Cough: Secondary | ICD-10-CM | POA: Diagnosis present

## 2017-03-07 DIAGNOSIS — E669 Obesity, unspecified: Secondary | ICD-10-CM | POA: Diagnosis present

## 2017-03-07 DIAGNOSIS — Z9989 Dependence on other enabling machines and devices: Secondary | ICD-10-CM

## 2017-03-07 DIAGNOSIS — I5032 Chronic diastolic (congestive) heart failure: Secondary | ICD-10-CM | POA: Diagnosis present

## 2017-03-07 DIAGNOSIS — Z6839 Body mass index (BMI) 39.0-39.9, adult: Secondary | ICD-10-CM

## 2017-03-07 DIAGNOSIS — R402413 Glasgow coma scale score 13-15, at hospital admission: Secondary | ICD-10-CM | POA: Diagnosis present

## 2017-03-07 LAB — BASIC METABOLIC PANEL
ANION GAP: 11 (ref 5–15)
BUN: 10 mg/dL (ref 6–20)
CALCIUM: 8.9 mg/dL (ref 8.9–10.3)
CO2: 24 mmol/L (ref 22–32)
Chloride: 101 mmol/L (ref 101–111)
Creatinine, Ser: 0.85 mg/dL (ref 0.61–1.24)
Glucose, Bld: 106 mg/dL — ABNORMAL HIGH (ref 65–99)
Potassium: 4.1 mmol/L (ref 3.5–5.1)
Sodium: 136 mmol/L (ref 135–145)

## 2017-03-07 LAB — CBC
HCT: 36.3 % — ABNORMAL LOW (ref 39.0–52.0)
HEMOGLOBIN: 12 g/dL — AB (ref 13.0–17.0)
MCH: 27.7 pg (ref 26.0–34.0)
MCHC: 33.1 g/dL (ref 30.0–36.0)
MCV: 83.8 fL (ref 78.0–100.0)
Platelets: 232 10*3/uL (ref 150–400)
RBC: 4.33 MIL/uL (ref 4.22–5.81)
RDW: 13.2 % (ref 11.5–15.5)
WBC: 13.6 10*3/uL — ABNORMAL HIGH (ref 4.0–10.5)

## 2017-03-07 MED ORDER — BENZONATATE 100 MG PO CAPS
200.0000 mg | ORAL_CAPSULE | Freq: Once | ORAL | Status: AC
Start: 2017-03-07 — End: 2017-03-07
  Administered 2017-03-07: 200 mg via ORAL
  Filled 2017-03-07: qty 2

## 2017-03-07 MED ORDER — ALBUTEROL SULFATE (2.5 MG/3ML) 0.083% IN NEBU
5.0000 mg | INHALATION_SOLUTION | Freq: Once | RESPIRATORY_TRACT | Status: DC
  Filled 2017-03-07: qty 6

## 2017-03-07 MED ORDER — DEXTROSE 5 % IV SOLN
2.0000 g | Freq: Once | INTRAVENOUS | Status: AC
  Administered 2017-03-07: 2 g via INTRAVENOUS
  Filled 2017-03-07: qty 2

## 2017-03-07 MED ORDER — VANCOMYCIN HCL 10 G IV SOLR
2500.0000 mg | Freq: Once | INTRAVENOUS | Status: AC
  Administered 2017-03-08: 2500 mg via INTRAVENOUS
  Filled 2017-03-07 (×2): qty 2500

## 2017-03-07 MED ORDER — IPRATROPIUM-ALBUTEROL 0.5-2.5 (3) MG/3ML IN SOLN
3.0000 mL | Freq: Once | RESPIRATORY_TRACT | Status: AC
  Administered 2017-03-07: 3 mL via RESPIRATORY_TRACT
  Filled 2017-03-07: qty 3

## 2017-03-07 MED ORDER — VANCOMYCIN HCL IN DEXTROSE 1-5 GM/200ML-% IV SOLN
1000.0000 mg | Freq: Two times a day (BID) | INTRAVENOUS | Status: DC
  Administered 2017-03-08 – 2017-03-10 (×6): 1000 mg via INTRAVENOUS
  Filled 2017-03-07 (×7): qty 200

## 2017-03-07 MED ORDER — IOPAMIDOL (ISOVUE-370) INJECTION 76%
INTRAVENOUS | Status: AC
  Administered 2017-03-07: 100 mL
  Filled 2017-03-07: qty 100

## 2017-03-07 NOTE — ED Triage Notes (Signed)
Pt states COPD with shortness of breath leading to panic attacks. Pt was on fourth round of steroids starting the 10th after he saw pulmonologist. Pt describing an episode when he was standing and "frozen, with jaw chattering and anxiety." Pt giving himself "CPAP treatments" at home, he places it on throughout the day and states he has good relief and feels like he can breath with it. He usually wears CPAP at night. Cough noted. Pt had a coughing attack in triage and then had a panic attack where he closed his eyes and told me "not right now"

## 2017-03-07 NOTE — ED Provider Notes (Signed)
[facility] EMERGENCY DEPARTMENT Provider Note   CSN: [id number] Arrival date & time: 03/07/17  1730     History   Chief Complaint Chief Complaint  Patient presents with  . Shortness of Breath  . COPD    HPI [name] is a 62 y.o. male.  HPI  The patient is a 62 year old male, he has a history of COPD, there is shortness of breath which has been significant and for which she has followed up with pulmonology multiple times in the past, and his pulmonologist is Dr. Delton Coombes, he has had a CT without contrast of the chest here recently which showed some post inflammatory or infectious changes but no other acute findings.  He has had multiple episodes of coughing spells which leave him feeling like he is drowning and for which she feels like he is having a panic attack.  But these are always related to a coughing spell, but they always resolve relatively quickly but they can be severe at times.  He has been on multiple doses of antibiotics recently including doxycycline and Levaquin which most recently finished approximately 3 weeks ago.  He has had no fevers but does cough and occasionally coughs up some blood.  He does report a history of needing a bronchoscopy several years ago, he reports that was the beginning of his downfall with regards to his worsening lung function.  Currently he is on multiple medications including albuterol, Ellipta, and multiple doses of prednisone.  He is currently taking prednisone and has been on several courses over the last month  Past Medical History:  Diagnosis Date  . ADD (attention deficit disorder)   . Anxiety    Panic  . Bipolar disorder (HCC)   . GERD (gastroesophageal reflux disease)    ? they thought GERD caused cough.   . Mental disorder    ADD, bipolar  . Pneumonia 04/12/2013  . Shortness of breath   . Substance abuse [facility])     Patient Active Problem List   Diagnosis Date Noted  . COPD with acute exacerbation (HCC)  01/19/2017  . OSA (obstructive sleep apnea) 01/11/2017  . Rash 01/11/2017  . Hyponatremia 10/13/2016  . Hypertension 07/09/2016  . History of adenomatous polyp of colon 07/09/2016  . 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 Obesity, Class II, BMI 35-39.9 07/09/2016  . Edema 07/09/2016  . COPD (chronic obstructive pulmonary disease) (HCC) 02/27/2015  . Dyspnea on exertion 01/18/2015  . Lung mass 10/10/2013  . Chronic maxillary sinusitis 09/27/2013  . GERD (gastroesophageal reflux disease) 09/27/2013  . Chronic cough 06/21/2013  . Alcohol dependence (HCC) 07/29/2011  . Bipolar affective disorder, depressed, moderate (HCC) 07/29/2011    Class: Chronic    Past Surgical History:  Procedure Laterality Date  . APPENDECTOMY  1988  . COLONOSCOPY    . TONSILLECTOMY    . VIDEO BRONCHOSCOPY Bilateral 10/10/2013   Procedure: VIDEO BRONCHOSCOPY WITH FLUORO;  Surgeon: Oretha Milch, MD;  Location: [facility] ENDOSCOPY;  Service: Cardiopulmonary;  Laterality: Bilateral;  . VIDEO BRONCHOSCOPY N/A 10/13/2013   Procedure: VIDEO BRONCHOSCOPY, REMOVAL OF ENDOBRONCHIAL LESION, PROBABLE FOREIGN BODY;  Surgeon: Delight Ovens, MD;  Location: [facility] OR;  Service: Thoracic;  Laterality: N/A;  . VIDEO BRONCHOSCOPY N/A 11/28/2013   Procedure: VIDEO BRONCHOSCOPY;  Surgeon: Delight Ovens, MD;  Location: [facility] OR;  Service: Thoracic;  Laterality: N/A;  bronchoscopy for removal of foreign body       Home Medications    Prior to Admission  medications   Medication Sig Start Date End Date Taking? Authorizing Provider  albuterol (PROAIR HFA) 108 (90 Base) MCG/ACT inhaler Inhale 2 puffs into the lungs every 6 (six) hours as needed for wheezing or shortness of breath.   Yes [provider]  albuterol (PROVENTIL) (2.5 MG/3ML) 0.083% nebulizer solution Take 3 mLs (2.5 mg total) by nebulization every 6 (six) hours as needed for wheezing or shortness of breath. Patient taking differently: Take 2.5 mg by nebulization See admin  instructions. Use one vial (2.5 mg) via nebulization two - three times daily for wheezing and shortness of breath 02/25/17  Yes Bevelyn Ngo, NP  ferrous sulfate 325 (65 FE) MG tablet Take 325 mg by mouth at bedtime.    Yes [provider]  FLUoxetine (PROZAC) 20 MG capsule Take 20 mg by mouth See admin instructions. Take one capsule (20 mg) by mouth with a 40 mg capsule daily at bedtime for a total dose of 60 mg. 02/28/17  Yes [provider]  FLUoxetine (PROZAC) 40 MG capsule Take 40 mg by mouth See admin instructions. Take one capsule (40 mg) by mouth with a 20 mg capsule daily at bedtime for a total dose of 60 mg. 02/28/17  Yes [provider]  Fluticasone-Umeclidin-Vilant (TRELEGY ELLIPTA) 100-62.5-25 MCG/INH AEPB Inhale 1 puff into the lungs daily. Patient taking differently: Inhale 1 puff into the lungs every evening.  03/05/17  Yes Leslye Peer, MD  gabapentin (NEURONTIN) 800 MG tablet Take 800 mg by mouth 3 (three) times daily.  07/14/15  Yes [provider]  hydrOXYzine (VISTARIL) 50 MG capsule Take 100 mg by mouth 3 (three) times daily.  10/07/16  Yes [provider]  losartan (COZAAR) 100 MG tablet Take 1 tablet (100 mg total) by mouth daily. Patient taking differently: Take 100 mg by mouth at bedtime.  09/05/16  Yes Shelva Majestic, MD  Melatonin 5 MG LOZG Place 5 mg under the tongue at bedtime.   Yes [provider]  naproxen sodium (ALEVE) 220 MG tablet Take 440 mg by mouth 2 (two) times daily as needed (pain/headache).   Yes [provider]  PRESCRIPTION MEDICATION Inhale into the lungs at bedtime. CPAP   Yes [provider]  traZODone (DESYREL) 100 MG tablet Take 50-100 mg by mouth at bedtime.    Yes [provider]  triamcinolone ointment (KENALOG) 0.5 % Apply 1 application topically 2 (two) times daily. 7 days maximum. Need at least 10 day break after that. Patient taking differently: Apply 1  application topically 2 (two) times daily as needed (rash). 7 days maximum. Need at least 10 day break after that. 01/11/17  Yes Shelva Majestic, MD  [facility] ELLIPTA 62.5-25 MCG/INH AEPB USE 1 PUFF DAILY AS DIRECTED Patient not taking: Reported on 03/07/2017 10/07/16   Leslye Peer, MD  famotidine (PEPCID) 20 MG tablet Take 1 tablet (20 mg total) by mouth at bedtime. Patient not taking: Reported on 03/07/2017 02/25/17   Bevelyn Ngo, NP  HYDROcodone-homatropine [facility]) 5-1.5 MG/5ML syrup Take 5 mLs by mouth every 6 (six) hours as needed for cough. Patient not taking: Reported on 03/07/2017 02/22/17   Leslye Peer, MD  HYDROcodone-homatropine [facility]) 5-1.5 MG/5ML syrup Take 5 mLs by mouth every 8 (eight) hours as needed for cough. Patient not taking: Reported on 03/07/2017 02/25/17   Bevelyn Ngo, NP  omeprazole (PRILOSEC) 20 MG capsule Take 1 capsule (20 mg total) by mouth daily. Patient not taking:  Reported on 03/07/2017 02/25/17   Bevelyn Ngo, NP    Family History Family History  Problem Relation Age of Onset  . Breast cancer Mother   . Alcohol abuse Father        unclear cause of death. 27  . Colon cancer Father   . Alcohol abuse Sister        substance as well  . Alcohol abuse Brother        substance abuse  . Alcohol abuse Sister        substance abuse    Social History Social History   Tobacco Use  . Smoking status: Former Smoker    Packs/day: 1.00    Years: 20.00    Pack years: 20.00    Types: Cigarettes    Last attempt to quit: 02/16/1993    Years since quitting: 24.0  . Smokeless tobacco: Never Used  Substance Use Topics  . Alcohol use: No    Comment: none at all  . Drug use: No     Allergies   Omeprazole   Review of Systems Review of Systems  All other systems reviewed and are negative.    Physical Exam Updated Vital Signs BP (!) 148/90   Pulse 87   Temp 98.6 F (37 C) (Oral)   Resp (!) 25   SpO2 91%   Physical Exam  Constitutional: He  appears well-developed and well-nourished. No distress.  HENT:  Head: Normocephalic and atraumatic.  Mouth/Throat: Oropharynx is clear and moist. No oropharyngeal exudate.  Eyes: Conjunctivae and EOM are normal. Pupils are equal, round, and reactive to light. Right eye exhibits no discharge. Left eye exhibits no discharge. No scleral icterus.  Neck: Normal range of motion. Neck supple. No JVD present. No thyromegaly present.  Cardiovascular: Normal rate, regular rhythm, normal heart sounds and intact distal pulses. Exam reveals no gallop and no friction rub.  No murmur heard. Pulmonary/Chest: Effort normal. No respiratory distress. He has wheezes ( Mild end expiratory wheezing). He has no rales.  Patient speaks in full sentences, he has occasional coughing attacks  Abdominal: Soft. Bowel sounds are normal. He exhibits no distension and no mass. There is no tenderness.  Musculoskeletal: Normal range of motion. He exhibits edema ( Bilateral symmetrical mild lower extremity edema). He exhibits no tenderness.  Lymphadenopathy:    He has no cervical adenopathy.  Neurological: He is alert. Coordination normal.  Skin: Skin is warm and dry. No rash noted. No erythema.  Psychiatric: He has a normal mood and affect. His behavior is normal.  Nursing note and vitals reviewed.    ED Treatments / Results  Labs (all labs ordered are listed, but only abnormal results are displayed) Labs Reviewed  BASIC METABOLIC PANEL - Abnormal; Notable for the following components:      Result Value   Glucose, Bld 106 (*)    All other components within normal limits  CBC - Abnormal; Notable for the following components:   WBC 13.6 (*)    Hemoglobin 12.0 (*)    HCT 36.3 (*)    All other components within normal limits    EKG  EKG Interpretation  Date/Time:  Sunday March 07 2017 18:10:25 EST Ventricular Rate:  82 PR Interval:  176 QRS Duration: 94 QT Interval:  392 QTC Calculation: 457 R  Axis:   58 Text Interpretation:  Normal sinus rhythm Normal ECG No old tracing to compare Confirmed by Eber Hong ([id number]) on 03/07/2017 6:46:37 PM  Radiology Ct Angio Chest Pe W And/or Wo Contrast  Result Date: 03/07/2017 CLINICAL DATA:  Short of breath.  COPD.  Insert treatment EXAM: CT ANGIOGRAPHY CHEST WITH CONTRAST TECHNIQUE: Multidetector CT imaging of the chest was performed using the standard protocol during bolus administration of intravenous contrast. Multiplanar CT image reconstructions and MIPs were obtained to evaluate the vascular anatomy. CONTRAST:  100mL ISOVUE-370 IOPAMIDOL (ISOVUE-370) INJECTION 76% COMPARISON:  CT 03/02/2017 FINDINGS: Cardiovascular: No filling defects in the pulmonary arteries to suggest acute pulmonary embolism. No acute findings aorta great vessels. No pericardial effusion Mediastinum/Nodes: No axillary supraclavicular adenopathy. There is bilateral infrahilar adenopathy. For example 13 mm RIGHT infrahilar node on image 168, series 6. 13 mm LEFT infrahilar node on image 134, series 6. Lungs/Pleura: There is bibasilar mild airspace disease as well as mild airspace disease in the posterior aspect of the LEFT upper lobe with air bronchograms. Upper lungs are clear. Upper Abdomen: Limited view of the liver, kidneys, pancreas are unremarkable. Normal adrenal glands. Musculoskeletal: No aggressive osseous lesion Review of the MIP images confirms the above findings. IMPRESSION: 1. No evidence acute pulmonary embolism. 2. Bibasilar mild airspace disease and mild consolidation in the posterior aspect of the lingula /LEFT upper lobe. Findings concerning for MULTIFOCAL PNEUMONIA. Aspiration pneumonitis or edema less favored. 3. Bilateral infrahilar adenopathy likely reactive and related to the airspace disease above. Electronically Signed   By: Genevive BiStewart  Edmunds M.D.   On: 03/07/2017 20:51    Procedures Procedures (including critical care time)  Medications Ordered in  ED Medications  albuterol (PROVENTIL) (2.5 MG/3ML) 0.083% nebulizer solution 5 mg (not administered)  ceFEPIme (MAXIPIME) 2 g in dextrose 5 % 50 mL IVPB (not administered)  vancomycin (VANCOCIN) 2,500 mg in sodium chloride 0.9 % 500 mL IVPB (not administered)  vancomycin (VANCOCIN) IVPB 1000 mg/200 mL premix (not administered)  ipratropium-albuterol (DUONEB) 0.5-2.5 (3) MG/3ML nebulizer solution 3 mL (3 mLs Nebulization Given 03/07/17 2026)  benzonatate (TESSALON) capsule 200 mg (200 mg Oral Given 03/07/17 2023)  iopamidol (ISOVUE-370) 76 % injection (100 mLs  Contrast Given 03/07/17 2004)     Initial Impression / Assessment and Plan / ED Course  I have reviewed the triage vital signs and the nursing notes.  Pertinent labs & imaging results that were available during my care of the patient were reviewed by me and considered in my medical decision making (see chart for details).  Clinical Course as of Mar 07 2213  Wynelle LinkSun Mar 07, 2017  2156 D/w Dr. Dellie CatholicSommers with E link - agrees with admission - and HCAP and will have PCCM see in the morning.  [BM]    Clinical Course User Index [BM] Eber HongMiller, Tobechukwu Emmick, MD    Patient does not have any acute findings to suggest that this is pneumonia, he had a recent CT scan that was negative however he has increasing coughing, increasing bloody sputum and some swelling in his legs thus he will need to have an evaluation for pulmonary embolism.  That being said he is not tachycardic nor is he hypoxic and does not use oxygen at home.  I do not think this is related to underlying cardiac disease, he may need another bronchoscopy as an outpatient.  CT scan ordered  Labs do show a leukocytosis which is not surprising given the amount of prednisone that he has been on over the last month  D/w Hospitalist - will admit  Final Clinical Impressions(s) / ED Diagnoses   Final diagnoses:  HCAP (healthcare-associated pneumonia)  ED Discharge Orders    None        Eber Hong, MD 03/07/17 2215

## 2017-03-07 NOTE — Progress Notes (Signed)
Pharmacy Antibiotic Note  Zachary BarterJohn B [name] is a 62 y.o. male admitted on 03/07/2017 with pneumonia.  Pharmacy has been consulted for vancomycin dosing. Cefepime 1g IV x1 ordered by EDP.  nCrCl ~2690mL/min.  Plan: Vancomycin 2500mg  IV x1, then 1g IV q12h per obesity nomogram Follow continued orders for gram negative coverage Follow c/s, clinical progression, renal function, level PRN     Temp (24hrs), Avg:98.6 F (37 C), Min:98.6 F (37 C), Max:98.6 F (37 C)  Recent Labs  Lab 03/07/17 1829  WBC 13.6*  CREATININE 0.85    Estimated Creatinine Clearance: 119.7 mL/min (by C-G formula based on SCr of 0.85 mg/dL).    Allergies  Allergen Reactions  . Omeprazole Diarrhea and Nausea And Vomiting    Antimicrobials this admission: Vancomycin 1/20 >>  Cefepime 1/20 >>   Dose adjustments this admission: n/a  Microbiology results: None ordered  Thank you for allowing pharmacy to be a part of this patient's care.  Sloane Junkin D. Owais Pruett, PharmD, BCPS Clinical Pharmacist  [phone] 03/07/2017 10:05 PM

## 2017-03-07 NOTE — ED Notes (Signed)
One set of blood cultures obtained w/ IV start.

## 2017-03-07 NOTE — ED Notes (Signed)
No answer for triage.

## 2017-03-07 NOTE — ED Notes (Signed)
ED Provider at bedside. 

## 2017-03-08 ENCOUNTER — Inpatient Hospital Stay (HOSPITAL_COMMUNITY): Payer: BLUE CROSS/BLUE SHIELD

## 2017-03-08 ENCOUNTER — Other Ambulatory Visit: Payer: Self-pay

## 2017-03-08 DIAGNOSIS — Z9889 Other specified postprocedural states: Secondary | ICD-10-CM | POA: Diagnosis not present

## 2017-03-08 DIAGNOSIS — Z8659 Personal history of other mental and behavioral disorders: Secondary | ICD-10-CM | POA: Diagnosis not present

## 2017-03-08 DIAGNOSIS — F319 Bipolar disorder, unspecified: Secondary | ICD-10-CM | POA: Diagnosis present

## 2017-03-08 DIAGNOSIS — J440 Chronic obstructive pulmonary disease with acute lower respiratory infection: Secondary | ICD-10-CM | POA: Diagnosis present

## 2017-03-08 DIAGNOSIS — I5032 Chronic diastolic (congestive) heart failure: Secondary | ICD-10-CM | POA: Diagnosis present

## 2017-03-08 DIAGNOSIS — R042 Hemoptysis: Secondary | ICD-10-CM | POA: Diagnosis present

## 2017-03-08 DIAGNOSIS — J189 Pneumonia, unspecified organism: Secondary | ICD-10-CM

## 2017-03-08 DIAGNOSIS — J986 Disorders of diaphragm: Secondary | ICD-10-CM

## 2017-03-08 DIAGNOSIS — J159 Unspecified bacterial pneumonia: Secondary | ICD-10-CM | POA: Diagnosis present

## 2017-03-08 DIAGNOSIS — Z9989 Dependence on other enabling machines and devices: Secondary | ICD-10-CM | POA: Diagnosis not present

## 2017-03-08 DIAGNOSIS — R0902 Hypoxemia: Secondary | ICD-10-CM | POA: Diagnosis present

## 2017-03-08 DIAGNOSIS — E669 Obesity, unspecified: Secondary | ICD-10-CM | POA: Diagnosis present

## 2017-03-08 DIAGNOSIS — Z79899 Other long term (current) drug therapy: Secondary | ICD-10-CM | POA: Diagnosis not present

## 2017-03-08 DIAGNOSIS — Z87891 Personal history of nicotine dependence: Secondary | ICD-10-CM | POA: Diagnosis not present

## 2017-03-08 DIAGNOSIS — Z23 Encounter for immunization: Secondary | ICD-10-CM | POA: Diagnosis not present

## 2017-03-08 DIAGNOSIS — K219 Gastro-esophageal reflux disease without esophagitis: Secondary | ICD-10-CM | POA: Diagnosis present

## 2017-03-08 DIAGNOSIS — J441 Chronic obstructive pulmonary disease with (acute) exacerbation: Secondary | ICD-10-CM | POA: Diagnosis present

## 2017-03-08 DIAGNOSIS — G4733 Obstructive sleep apnea (adult) (pediatric): Secondary | ICD-10-CM | POA: Diagnosis present

## 2017-03-08 DIAGNOSIS — R402413 Glasgow coma scale score 13-15, at hospital admission: Secondary | ICD-10-CM | POA: Diagnosis present

## 2017-03-08 DIAGNOSIS — D509 Iron deficiency anemia, unspecified: Secondary | ICD-10-CM | POA: Diagnosis present

## 2017-03-08 DIAGNOSIS — Y95 Nosocomial condition: Secondary | ICD-10-CM | POA: Diagnosis present

## 2017-03-08 DIAGNOSIS — F410 Panic disorder [episodic paroxysmal anxiety] without agoraphobia: Secondary | ICD-10-CM | POA: Diagnosis present

## 2017-03-08 DIAGNOSIS — I110 Hypertensive heart disease with heart failure: Secondary | ICD-10-CM | POA: Diagnosis present

## 2017-03-08 DIAGNOSIS — R1310 Dysphagia, unspecified: Secondary | ICD-10-CM | POA: Diagnosis present

## 2017-03-08 DIAGNOSIS — R0600 Dyspnea, unspecified: Secondary | ICD-10-CM | POA: Diagnosis not present

## 2017-03-08 DIAGNOSIS — J471 Bronchiectasis with (acute) exacerbation: Secondary | ICD-10-CM | POA: Diagnosis not present

## 2017-03-08 DIAGNOSIS — Z888 Allergy status to other drugs, medicaments and biological substances status: Secondary | ICD-10-CM | POA: Diagnosis not present

## 2017-03-08 DIAGNOSIS — Z6839 Body mass index (BMI) 39.0-39.9, adult: Secondary | ICD-10-CM | POA: Diagnosis not present

## 2017-03-08 DIAGNOSIS — Z7951 Long term (current) use of inhaled steroids: Secondary | ICD-10-CM | POA: Diagnosis not present

## 2017-03-08 DIAGNOSIS — R05 Cough: Secondary | ICD-10-CM | POA: Diagnosis not present

## 2017-03-08 LAB — CBC
HCT: 32.3 % — ABNORMAL LOW (ref 39.0–52.0)
Hemoglobin: 10.5 g/dL — ABNORMAL LOW (ref 13.0–17.0)
MCH: 27.1 pg (ref 26.0–34.0)
MCHC: 32.5 g/dL (ref 30.0–36.0)
MCV: 83.2 fL (ref 78.0–100.0)
Platelets: 222 10*3/uL (ref 150–400)
RBC: 3.88 MIL/uL — ABNORMAL LOW (ref 4.22–5.81)
RDW: 13.4 % (ref 11.5–15.5)
WBC: 11.2 10*3/uL — ABNORMAL HIGH (ref 4.0–10.5)

## 2017-03-08 LAB — RESPIRATORY PANEL BY PCR
Adenovirus: NOT DETECTED
Bordetella pertussis: NOT DETECTED
CORONAVIRUS NL63-RVPPCR: NOT DETECTED
CORONAVIRUS OC43-RVPPCR: NOT DETECTED
Chlamydophila pneumoniae: NOT DETECTED
Coronavirus 229E: NOT DETECTED
Coronavirus HKU1: NOT DETECTED
INFLUENZA A-RVPPCR: NOT DETECTED
Influenza B: NOT DETECTED
Metapneumovirus: NOT DETECTED
Mycoplasma pneumoniae: NOT DETECTED
PARAINFLUENZA VIRUS 1-RVPPCR: NOT DETECTED
PARAINFLUENZA VIRUS 3-RVPPCR: NOT DETECTED
PARAINFLUENZA VIRUS 4-RVPPCR: NOT DETECTED
Parainfluenza Virus 2: NOT DETECTED
RHINOVIRUS / ENTEROVIRUS - RVPPCR: NOT DETECTED
Respiratory Syncytial Virus: NOT DETECTED

## 2017-03-08 LAB — BASIC METABOLIC PANEL
Anion gap: 9 (ref 5–15)
BUN: 11 mg/dL (ref 6–20)
CALCIUM: 8.4 mg/dL — AB (ref 8.9–10.3)
CO2: 23 mmol/L (ref 22–32)
CREATININE: 0.92 mg/dL (ref 0.61–1.24)
Chloride: 105 mmol/L (ref 101–111)
GFR calc Af Amer: >60 mL/min (ref >60)
GLUCOSE: 108 mg/dL — AB (ref 65–99)
POTASSIUM: 4.3 mmol/L (ref 3.5–5.1)
SODIUM: 137 mmol/L (ref 135–145)

## 2017-03-08 LAB — PROCALCITONIN: Procalcitonin: 1.88 ng/mL

## 2017-03-08 MED ORDER — FLUOXETINE HCL 20 MG PO CAPS
60.0000 mg | ORAL_CAPSULE | Freq: Every day | ORAL | Status: DC
Start: 2017-03-08 — End: 2017-03-11
  Administered 2017-03-08 – 2017-03-10 (×3): 60 mg via ORAL
  Filled 2017-03-08 (×3): qty 3

## 2017-03-08 MED ORDER — IPRATROPIUM-ALBUTEROL 0.5-2.5 (3) MG/3ML IN SOLN
3.0000 mL | Freq: Four times a day (QID) | RESPIRATORY_TRACT | Status: DC
  Administered 2017-03-08 – 2017-03-11 (×12): 3 mL via RESPIRATORY_TRACT
  Filled 2017-03-08 (×12): qty 3

## 2017-03-08 MED ORDER — PANTOPRAZOLE SODIUM 40 MG PO TBEC
40.0000 mg | DELAYED_RELEASE_TABLET | Freq: Every day | ORAL | Status: DC
  Administered 2017-03-08: 40 mg via ORAL
  Filled 2017-03-08: qty 1

## 2017-03-08 MED ORDER — HYDROCOD POLST-CPM POLST ER 10-8 MG/5ML PO SUER
5.0000 mL | Freq: Once | ORAL | Status: AC
  Administered 2017-03-08: 5 mL via ORAL
  Filled 2017-03-08: qty 5

## 2017-03-08 MED ORDER — LOSARTAN POTASSIUM 50 MG PO TABS
100.0000 mg | ORAL_TABLET | Freq: Every day | ORAL | Status: DC
  Administered 2017-03-08 – 2017-03-11 (×4): 100 mg via ORAL
  Filled 2017-03-08 (×5): qty 2

## 2017-03-08 MED ORDER — PANTOPRAZOLE SODIUM 40 MG PO TBEC
40.0000 mg | DELAYED_RELEASE_TABLET | Freq: Two times a day (BID) | ORAL | Status: DC
  Administered 2017-03-08 – 2017-03-11 (×6): 40 mg via ORAL
  Filled 2017-03-08 (×6): qty 1

## 2017-03-08 MED ORDER — GABAPENTIN 400 MG PO CAPS
800.0000 mg | ORAL_CAPSULE | Freq: Three times a day (TID) | ORAL | Status: DC
  Administered 2017-03-08 – 2017-03-11 (×10): 800 mg via ORAL
  Filled 2017-03-08 (×10): qty 2

## 2017-03-08 MED ORDER — FERROUS SULFATE 325 (65 FE) MG PO TABS
325.0000 mg | ORAL_TABLET | Freq: Every day | ORAL | Status: DC
  Administered 2017-03-08 – 2017-03-10 (×3): 325 mg via ORAL
  Filled 2017-03-08 (×3): qty 1

## 2017-03-08 MED ORDER — TRAZODONE HCL 50 MG PO TABS
50.0000 mg | ORAL_TABLET | Freq: Every day | ORAL | Status: DC
  Administered 2017-03-08 – 2017-03-10 (×3): 50 mg via ORAL
  Filled 2017-03-08 (×3): qty 1

## 2017-03-08 MED ORDER — DEXTROSE 5 % IV SOLN
1.0000 g | Freq: Three times a day (TID) | INTRAVENOUS | Status: DC
  Administered 2017-03-08 – 2017-03-11 (×10): 1 g via INTRAVENOUS
  Filled 2017-03-08 (×11): qty 1

## 2017-03-08 MED ORDER — ACETAMINOPHEN 325 MG PO TABS
650.0000 mg | ORAL_TABLET | Freq: Four times a day (QID) | ORAL | Status: DC | PRN
  Administered 2017-03-08: 650 mg via ORAL
  Filled 2017-03-08: qty 2

## 2017-03-08 MED ORDER — FLUOXETINE HCL 40 MG PO CAPS
40.0000 mg | ORAL_CAPSULE | Freq: Every day | ORAL | Status: DC
Start: 2017-03-08 — End: 2017-03-08

## 2017-03-08 MED ORDER — FLUOXETINE HCL 20 MG PO CAPS: 20.0000 mg | ORAL_CAPSULE | Freq: Every day | ORAL | Status: DC

## 2017-03-08 MED ORDER — IBUPROFEN 600 MG PO TABS: 600.0000 mg | ORAL_TABLET | Freq: Four times a day (QID) | ORAL | Status: DC | PRN

## 2017-03-08 MED ORDER — PNEUMOCOCCAL VAC POLYVALENT 25 MCG/0.5ML IJ INJ
0.5000 mL | INJECTION | INTRAMUSCULAR | Status: AC
  Administered 2017-03-09: 0.5 mL via INTRAMUSCULAR
  Filled 2017-03-08: qty 0.5

## 2017-03-08 MED ORDER — IPRATROPIUM-ALBUTEROL 0.5-2.5 (3) MG/3ML IN SOLN
3.0000 mL | RESPIRATORY_TRACT | Status: DC | PRN
  Administered 2017-03-08: 3 mL via RESPIRATORY_TRACT
  Filled 2017-03-08: qty 3

## 2017-03-08 NOTE — H&P (Signed)
History and Physical   Michaelyn BarterJohn B Crable [id number] DOB: 24-Feb-1955 DOA: 03/07/2017  PCP: Shelva MajesticHunter, Stephen O, MD  Chief Complaint: coughing spells  HPI:  This is a 62 year old obese man with COPD, chronic cough, diaphragmatic paralysis, OSA on CPAP, major depressive disorder, hypertension, GERD who reports seeking medical attention for ongoing coughing spells associated with shortness of breath.  History obtained via review of the EMR as well as discussion with the emergency medicine team, in conjunction with history from the patient and his family. They report that since November 2018 his primary symptoms have included coughing spells and worsening shortness of breath. He is been treated with fluoroquinolone therapy as well as cough suppressants and multiple treatments of prednisone. Over the past week he reports ongoing weakness and shortness of breath, frequently will have panic attacks. He reports a cough productive of cloudy sputum, at times associated with some blood. He reports amount of blood-tinged sputum was less than a tablespoon. He reports some subjective fever and chills. He reports pleuritic right-sided chest pain.  Other details include he lives with his wife, he is retired, independent in his ADLs. He reports having a alcoholic beverage every other day, regarding his major depression he reports that his symptoms are persistent, he has received ECT as well as TMS for treatment.  ED Course: The emergency department vital signs remarkable for increase respiratory rate 25, CBC with leukocytosis of 13.6, BMP unremarkable, CT PE study revealed findings concerning for multifocal pneumonia.  He was given cefepime and vancomycin for. Treatment for pneumonia and Hospital medicine team was consulted for further management.  Review of Systems: A complete ROS was obtained; pertinent positives negatives are denoted in the HPI. Otherwise, all systems are negative.   Past Medical History:    Diagnosis Date  . ADD (attention deficit disorder)   . Anxiety    Panic  . Bipolar disorder (HCC)   . GERD (gastroesophageal reflux disease)    ? they thought GERD caused cough.   . Mental disorder    ADD, bipolar  . Pneumonia 04/12/2013  . Shortness of breath   . Substance abuse (HCC)    Social History   Socioeconomic History  . Marital status: Married    Spouse name: Not on file  . Number of children: Not on file  . Years of education: Not on file  . Highest education level: Not on file  Social Needs  . Financial resource strain: Not on file  . Food insecurity - worry: Not on file  . Food insecurity - inability: Not on file  . Transportation needs - medical: Not on file  . Transportation needs - non-medical: Not on file  Occupational History  . Not on file  Tobacco Use  . Smoking status: Former Smoker    Packs/day: 1.00    Years: 20.00    Pack years: 20.00    Types: Cigarettes    Last attempt to quit: 02/16/1993    Years since quitting: 24.0  . Smokeless tobacco: Never Used  Substance and Sexual Activity  . Alcohol use: No    Comment: none at all  . Drug use: No  . Sexual activity: Yes  Other Topics Concern  . Not on file  Social History Narrative   Married. Lives with wife. 2 children. Soon to be grandfather 07/2016.       Freelance work/semi retired. Editing/writing.       Hobbies: enjoys writing poetry   Family History  Problem Relation Age of  Onset  . Breast cancer Mother   . Alcohol abuse Father        unclear cause of death. 47  . Colon cancer Father   . Alcohol abuse Sister        substance as well  . Alcohol abuse Brother        substance abuse  . Alcohol abuse Sister        substance abuse    Physical Exam: Vitals:   03/08/17 0000 03/08/17 0015 03/08/17 0030 03/08/17 0100  BP: 139/72 (!) 142/92 (!) 134/92 126/77  Pulse: 78 75 70 72  Resp: (!) 23 (!) 23 (!) 21 (!) 22  Temp:      TempSrc:      SpO2: (!) 89% (!) 87% 90% 92%   General:  Obese white man, bearded, has 4-5 coughing fits during encounter creating mild distress ENT: Grossly normal hearing, MMM. Cardiovascular: RRR. No M/R/G. No LE edema.  Respiratory: CTA bilaterally. No wheezes or crackles. Normal respiratory effort at rest, increased respiratory effort during coughing spells. Abdomen: Soft, non-tender. Bowel sounds present.  Skin: No rash or induration seen on limited exam. Musculoskeletal: Grossly normal tone BUE/BLE. Appropriate ROM.  Psychiatric: Grossly normal mood and affect. Neurologic: Moves all extremities in coordinated fashion.  I have personally reviewed the following labs, culture data, and imaging studies.  Assessment/Plan:  Pneumonia, bacterial likely He has failed outpatient management (prednisone, po antimicrobial therapy, cough suppressants) now with  CT evidence of multifocal pneumonia.  Another possibility is aspiration in the setting of un-treated GERD (he stopped his PPI) and use of sedative medications (melatonin, hycodan cough syrup, trazodone). He reports having bronchoscopy in the past. PLAN: -will continue empiric treatment of pneumonia with cefepime + vancomycin and await further micro data -RVP to evaluate other infectious causes -NPO in event bronchoscopy deemed beneficial for further evaluation -speech therapy to evaluate swallowing status -avoiding sedative agents -NSAID prn for pleuritic CP -consider pulmonary consultation in AM  Other problems -COPD: will hold inhaled steroid given concern for PNA, duo-nebs prn SOB / wheezing -GERD: protonix -OSA: CPAP qhs -Depression: continue home SSRI therapy, gabapentin -Hx of anemia: continue home po iron supplement  DVT prophylaxis: SCDs given recent small volume hemoptysis Code Status: full code Disposition Plan: Anticipate D/C home in 2-5 days Consults called: none Admission status:  Admit to hospital medicine service  Laurell Roof, MD [facility] [phone]  If 7PM-7AM, please contact night-coverage [email or web address] Password [email or web address]

## 2017-03-08 NOTE — Progress Notes (Signed)
Modified Barium Swallow Progress Note  Patient Details  Name: [name] MRN: [id number] Date of Birth: 12-19-1955  Today's Date: 03/08/2017  Modified Barium Swallow completed.  Full report located under Chart Review in the Imaging Section.  Brief recommendations include the following:  Clinical Impression  Patient presents with a normal oropharyngeal swallow with full airway protection and pharyngeal clearance. Question of a small CP bar noted. This did not impact clearance of bolus or results in any episodes of backflow during today's study even when challenged with multiple large consecutive boluses, however cannot r/o impact the larger quanitites of po over the course of a meal or with improper positioning. Education complete regarding general safe swallowing precautions. No further SLP needs indicated. Defer further eosphageal w/u or intervention to MD if feel necessary based on additional w/u for recurrent PNA. At the least, question if PPI would be beneficial.    Swallow Evaluation Recommendations   Recommended Consults: Consider esophageal assessment   SLP Diet Recommendations: Regular solids;Thin liquid   Liquid Administration via: Cup;Straw   Medication Administration: Whole meds with liquid   Supervision: Patient able to self feed   Compensations: Slow rate;Small sips/bites;Follow solids with liquid   Postural Changes: Seated upright at 90 degrees;Remain semi-upright after after feeds/meals (Comment)   Oral Care Recommendations: Oral care BID       Fredy Gladu MA, CCC-SLP [phone]  [name] 03/08/2017,11:55 AM

## 2017-03-08 NOTE — Progress Notes (Signed)
Pt reported small amount of red blood in stool, possibly due to constipation per pt. VS stable, no c/o dizziness. MD notified.

## 2017-03-08 NOTE — Evaluation (Signed)
Clinical/Bedside Swallow Evaluation Patient Details  Name: [name] MRN: [id number] Date of Birth: [birth year]  Today's Date: 03/08/2017 Time: SLP Start Time (ACUTE ONLY): 0940 SLP Stop Time (ACUTE ONLY): 1014 SLP Time Calculation (min) (ACUTE ONLY): 34 min  Past Medical History:  Past Medical History:  Diagnosis Date  . ADD (attention deficit disorder)   . Anxiety    Panic  . Bipolar disorder (HCC)   . GERD (gastroesophageal reflux disease)    ? they thought GERD caused cough.   . Mental disorder    ADD, bipolar  . Pneumonia 04/12/2013  . Shortness of breath   . Substance abuse [facility](HCC)    Past Surgical History:  Past Surgical History:  Procedure Laterality Date  . APPENDECTOMY  1988  . COLONOSCOPY    . TONSILLECTOMY    . VIDEO BRONCHOSCOPY Bilateral 10/10/2013   Procedure: VIDEO BRONCHOSCOPY WITH FLUORO;  Surgeon: Oretha Milchakesh Alva V, MD;  [facility];  Service: Cardiopulmonary;  Laterality: Bilateral;  . VIDEO BRONCHOSCOPY N/A 10/13/2013   Procedure: VIDEO BRONCHOSCOPY, REMOVAL OF ENDOBRONCHIAL LESION, PROBABLE FOREIGN BODY;  Surgeon: Delight OvensEdward B Gerhardt, MD;  Location: [facility] OR;  Service: Thoracic;  Laterality: N/A;  . VIDEO BRONCHOSCOPY N/A 11/28/2013   Procedure: VIDEO BRONCHOSCOPY;  Surgeon: Delight OvensEdward B Gerhardt, MD;  Location: [facility] OR;  Service: Thoracic;  Laterality: N/A;  bronchoscopy for removal of foreign body   HPI:  This is a 62 year old obese man with COPD, chronic cough, bipolar d/o, PNA, diaphragmatic paralysis, OSA on CPAP, major depressive disorder, hypertension, GERD who reports seeking medical attention for ongoing coughing spells associated with shortness of breath, frequent panic attacks. MD questioning aspiration in the setting of untreated GERD. MBS complete 10/02/13, normal oropharyngeal swallow. Bronchoscopy complete 09/2013 however noted foreign body aspiration in lower right lobe with f/o consultation and bronch complete by cardiothoracic surgeon removing and  confirming foreign body as "vegetable material." Chest CT 03/07/17: Bibasilar mild airspace disease and mild consolidation in the posterior aspect of the lingula /LEFT upper lobe. Findings concerning for MULTIFOCAL PNEUMONIA. Aspiration pneumonitis or edema less favored.   Assessment / Plan / Recommendation Clinical Impression  Swallow evaluation complete at bedside. Suspect that oropharyngeal swallowing function, as during previous MBS in 2015, is normal. Findings suspicious for esophageal component however given c/o globus, particularly with multiple large boluses. Cannot r/o chronic aspiration related to uncontrolled GERD. Will plan for MBS this am. Patient and spouse aware.  SLP Visit Diagnosis: Dysphagia, unspecified (R13.10)    Aspiration Risk       Diet Recommendation NPO   Medication Administration: Via alternative means    Other  Recommendations Oral Care Recommendations: Oral care QID   Follow up Recommendations        Frequency and Duration            Prognosis        Swallow Study   General HPI: This is a 62 year old obese man with COPD, chronic cough, bipolar d/o, PNA, diaphragmatic paralysis, OSA on CPAP, major depressive disorder, hypertension, GERD who reports seeking medical attention for ongoing coughing spells associated with shortness of breath, frequent panic attacks. MD questioning aspiration in the setting of untreated GERD. MBS complete 10/02/13, normal oropharyngeal swallow. Bronchoscopy complete 09/2013 however noted foreign body aspiration in lower right lobe with f/o consultation and bronch complete by cardiothoracic surgeon removing and confirming foreign body as "vegetable material." Chest CT 03/07/17: Bibasilar mild airspace disease and mild consolidation in the posterior aspect of the lingula /LEFT  upper lobe. Findings concerning for MULTIFOCAL PNEUMONIA. Aspiration pneumonitis or edema less favored. Type of Study: Bedside Swallow Evaluation Previous Swallow  Assessment: see HPI Diet Prior to this Study: NPO Temperature Spikes Noted: No Respiratory Status: Room air History of Recent Intubation: No Behavior/Cognition: Alert;Cooperative;Pleasant mood Oral Cavity Assessment: Within Functional Limits Oral Care Completed by SLP: No Oral Cavity - Dentition: Adequate natural dentition Vision: Functional for self-feeding Self-Feeding Abilities: Able to feed self Patient Positioning: Upright in bed Baseline Vocal Quality: Normal Volitional Cough: Strong Volitional Swallow: Able to elicit    Oral/Motor/Sensory Function Overall Oral Motor/Sensory Function: Within functional limits   Ice Chips Ice chips: Not tested   Thin Liquid Thin Liquid: Impaired Presentation: Cup;Self Fed;Straw Pharyngeal  Phase Impairments: Other (comments)(c/o globus with multiple consecutive sips)    Nectar Thick Nectar Thick Liquid: Not tested   Honey Thick Honey Thick Liquid: Not tested   Puree Puree: Within functional limits Presentation: Self Fed;Spoon   Solid   GO   Solid: Impaired Presentation: Self Fed Pharyngeal Phase Impairments: Multiple swallows;Other (comments)(c/o globus with large sips, followed by liquid wash)       Zachary Lango MA, CCC-SLP [phone]  [name] [name] 03/08/2017,10:21 AM

## 2017-03-08 NOTE — Progress Notes (Signed)
Patient was not available-gone for a test or procedure.  Wife was in room. I gave AD materials to her and told her to tell nurse he want to see chaplain if any questions or wants to complete it. Was not sure whether they would do it or not but they know to call for chaplain if they want to complete the forms. Phebe CollaDonna S Jahking Lesser, Chaplain    03/08/17 1200  Clinical Encounter Type  Visited With Other (Comment) (wife)  Visit Type Initial;Other (Comment) (gave AD forms to wife.)  Referral From Physician  Consult/Referral To Chaplain

## 2017-03-08 NOTE — Progress Notes (Signed)
Patient admitted after midnight, please see H&P.  Here with chronic coughing spells that have been worsening.  Seen by SLP.  CTA of chest shows multifocal PNA.  Appears PCCM to consult as listed on treatment team.  NP swab pending.  Was started on maxipime and vanc in the ER due to failing outpatient treatment.  Lungs should virtually clear, no wheezing.  Has seen Dr. Pollyann Kennedyosen with ENT in the past when coughing episodes started-- has bronch with foreign body removal.  ? Allergies vs GERD.   [facility]  Zachary CanaryJessica Amitai Delaughter DO

## 2017-03-08 NOTE — Consult Note (Signed)
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  He is followed in our clinic by Dr. Delton Coombes.  Since approximately November he has been treated approximately 4-5 times for recurrent respiratory tract infections utilizing antibiotics and steroid tapers.  Most recently seen in our office on 1/10 for slow to resolve COPD exacerbation.  At that time he was instructed to continue slow prednisone taper, continue bronchodilators, also prescribed cough suppression and CT of chest was ordered.  CT chest at that time showed no acute cardiopulmonary problems however did have  chronic appearing scarring within the anterior right lower lobe and posterior left lower lobe.  Since his last visit in our office his cough is been essentially persistent.  He reports it at times is clear then sometimes streaked with blood.  He has had subjective fever, chills, he reports he is coughed so hard that he had almost fallen at home.  This is what drove him to being evaluated at the hospital.  In the ER he was found to be tachypneic, he had a mild leukocytosis of 13.6, and complained of left-sided chest discomfort.  He reports that he has a sensation that his left chest feels "boggy" as if the left chest is just "followed by infection".  A CT of chest was obtained once again in the emergency room on 1/20: This was negative for acute pulmonary emboli but did show bibasilar and multifocal airspace disease.  Pulmonary was asked to evaluate given recurrent pneumonia   SIGNIFICANT EVENTS   STUDIES:  CT chest 1/20: CT chest 1/20:1. No evidence acute pulmonary embolism.2. Bibasilar mild airspace  disease and mild consolidation in the posterior aspect of the lingula /LEFT upper lobe. Findings concerning for MULTIFOCAL PNEUMONIA. Aspiration pneumonitis or edema less favored. 3. Bilateral infrahilar adenopathy likely reactive and related to the airspace disease above. 1/21 Modified barium swallow:: normal oropharyngeal swallow with full airway protection and pharyngeal clearance. Question of a small CP bar noted. This did not impact clearance of bolus or results in any episodes of backflow during today's study even when challenged with multiple large consecutive boluses, however cannot r/o impact the larger quanitites of po over the course of a meal or with improper position   PAST MEDICAL HISTORY :   has a past medical history of ADD (attention deficit disorder), Anxiety, Bipolar disorder (HCC), GERD (gastroesophageal reflux disease), Mental disorder, Pneumonia (04/12/2013), Shortness of breath, and Substance abuse (HCC).  has a past surgical history that includes Colonoscopy; Video bronchoscopy (Bilateral, 10/10/2013); Appendectomy (1988); Tonsillectomy; Video bronchoscopy (N/A, 10/13/2013); and Video bronchoscopy (N/A, 11/28/2013). Prior to Admission medications   Medication Sig Start Date End Date Taking? Authorizing Provider  albuterol (PROAIR HFA) 108 (90 Base) MCG/ACT inhaler Inhale 2 puffs into the lungs every 6 (six) hours as needed for wheezing or shortness of breath.   Yes [provider]  albuterol (PROVENTIL) (2.5 MG/3ML) 0.083% nebulizer solution Take 3 mLs (2.5 mg total) by nebulization every 6 (six) hours as needed for wheezing or shortness of breath. Patient taking differently: Take 2.5 mg by nebulization See admin instructions. Use one vial (2.5 mg) via nebulization two - three times daily for wheezing and  shortness of breath 02/25/17  Yes Bevelyn NgoGroce, Sarah F, NP  ferrous sulfate 325 (65 FE) MG tablet Take 325 mg by mouth at bedtime.    Yes [provider]  FLUoxetine  (PROZAC) 20 MG capsule Take 20 mg by mouth See admin instructions. Take one capsule (20 mg) by mouth with a 40 mg capsule daily at bedtime for a total dose of 60 mg. 02/28/17  Yes [provider]  FLUoxetine (PROZAC) 40 MG capsule Take 40 mg by mouth See admin instructions. Take one capsule (40 mg) by mouth with a 20 mg capsule daily at bedtime for a total dose of 60 mg. 02/28/17  Yes [provider]  Fluticasone-Umeclidin-Vilant (TRELEGY ELLIPTA) 100-62.5-25 MCG/INH AEPB Inhale 1 puff into the lungs daily. Patient taking differently: Inhale 1 puff into the lungs every evening.  03/05/17  Yes Leslye PeerByrum, Robert S, MD  gabapentin (NEURONTIN) 800 MG tablet Take 800 mg by mouth 3 (three) times daily.  07/14/15  Yes [provider]  hydrOXYzine (VISTARIL) 50 MG capsule Take 100 mg by mouth 3 (three) times daily.  10/07/16  Yes [provider]  losartan (COZAAR) 100 MG tablet Take 1 tablet (100 mg total) by mouth daily. Patient taking differently: Take 100 mg by mouth at bedtime.  09/05/16  Yes Shelva MajesticHunter, Stephen O, MD  Melatonin 5 MG LOZG Place 5 mg under the tongue at bedtime.   Yes [provider]  naproxen sodium (ALEVE) 220 MG tablet Take 440 mg by mouth 2 (two) times daily as needed (pain/headache).   Yes [provider]  PRESCRIPTION MEDICATION Inhale into the lungs at bedtime. CPAP   Yes [provider]  traZODone (DESYREL) 100 MG tablet Take 50-100 mg by mouth at bedtime.    Yes [provider]  triamcinolone ointment (KENALOG) 0.5 % Apply 1 application topically 2 (two) times daily. 7 days maximum. Need at least 10 day break after that. Patient taking differently: Apply 1 application topically 2 (two) times daily as needed (rash). 7 days maximum. Need at least 10 day break after that. 01/11/17  Yes Shelva MajesticHunter, Stephen O, MD  [facility] 62.5-25 MCG/INH AEPB USE 1 PUFF DAILY AS DIRECTED Patient not taking: Reported on 03/07/2017 10/07/16    Leslye PeerByrum, Robert S, MD  famotidine (PEPCID) 20 MG tablet Take 1 tablet (20 mg total) by mouth at bedtime. Patient not taking: Reported on 03/07/2017 02/25/17   Bevelyn NgoGroce, Sarah F, NP  HYDROcodone-homatropine [facility](HYCODAN) 5-1.5 MG/5ML syrup Take 5 mLs by mouth every 6 (six) hours as needed for cough. Patient not taking: Reported on 03/07/2017 02/22/17   Leslye PeerByrum, Robert S, MD  HYDROcodone-homatropine [facility](HYCODAN) 5-1.5 MG/5ML syrup Take 5 mLs by mouth every 8 (eight) hours as needed for cough. Patient not taking: Reported on 03/07/2017 02/25/17   Bevelyn NgoGroce, Sarah F, NP  omeprazole (PRILOSEC) 20 MG capsule Take 1 capsule (20 mg total) by mouth daily. Patient not taking: Reported on 03/07/2017 02/25/17   Bevelyn NgoGroce, Sarah F, NP   Allergies  Allergen Reactions  . Omeprazole Diarrhea and Nausea And Vomiting    FAMILY HISTORY:  family history includes Alcohol abuse in his brother, father, sister, and sister; Breast cancer in his mother; Colon cancer in his father. SOCIAL HISTORY:  reports that he quit smoking about 24 years ago. His smoking use included cigarettes. He has a 20.00 pack-year smoking history. he has never used smokeless tobacco. He reports that he does not drink alcohol or use drugs.  REVIEW OF SYSTEMS:  Constitutional:+fever, + chills, -weight loss, + weight gain (felt d/t prednisone), + malaise/fatigue and diaphoresis.  HENT: Negative for hearing loss, ear pain, nosebleeds, congestion, sore throat, neck pain, tinnitus and ear discharge.   Eyes: Negative for blurred vision, double vision, photophobia, pain, discharge and redness.  Respiratory: + cough, streaky hemoptysis +  shortness of breath, -wheezing and stridor.   Cardiovascular: left lower chest pain, palpitations, orthopnea, claudication, leg swelling and PND.  Gastrointestinal: Negative for heartburn, nausea, vomiting, abdominal pain, diarrhea, constipation, blood in stool and melena.  Genitourinary: Negative for dysuria, urgency, frequency, hematuria and  flank pain.  Musculoskeletal: Negative for myalgias, back pain, joint pain and falls.  Skin: Negative for itching and rash.  Neurological: Negative for dizziness, tingling, tremors, sensory change, speech change, focal weakness, seizures, loss of consciousness, weakness and headaches.  Endo/Heme/Allergies: Negative for environmental allergies and polydipsia. Does not bruise/bleed easily.  SUBJECTIVE:  No distress feels may be a little better  VITAL SIGNS: Temp:  [97.7 F (36.5 C)-99.1 F (37.3 C)] 99.1 F (37.3 C) (01/21 1406) Pulse Rate:  [61-87] 66 (01/21 1406) Resp:  [18-25] 20 (01/21 1406) BP: (124-160)/(72-100) 160/96 (01/21 1406) SpO2:  [87 %-99 %] 95 % (01/21 1406) Weight:  [275 lb 9.2 oz (125 kg)] 275 lb 9.2 oz (125 kg) (01/21 0417)  PHYSICAL EXAMINATION: General: 62 year old white male currently sitting up in bed no acute distress HEENT: Normocephalic atraumatic no jugular venous distention Cardiac: Regular rate and rhythm Pulmonary: Diminished throughout, fine crackles left base, no accessory use. Abdomen: Soft nontender no warm dry brisk cap refill no edema. Neuro/psych: Awake alert and oriented no focal deficits  Recent Labs  Lab 03/07/17 1829 03/08/17 0324  NA 136 137  K 4.1 4.3  CL 101 105  CO2 24 23  BUN 10 11  CREATININE 0.85 0.92  GLUCOSE 106* 108*   Recent Labs  Lab 03/07/17 1829 03/08/17 0324  HGB 12.0* 10.5*  HCT 36.3* 32.3*  WBC 13.6* 11.2*  PLT 232 222   Ct Angio Chest Pe W And/or Wo Contrast  Result Date: 03/07/2017 CLINICAL DATA:  Short of breath.  COPD.  Insert treatment EXAM: CT ANGIOGRAPHY CHEST WITH CONTRAST TECHNIQUE: Multidetector CT imaging of the chest was performed using the standard protocol during bolus administration of intravenous contrast. Multiplanar CT image reconstructions and MIPs were obtained to evaluate the vascular anatomy. CONTRAST:  ISOVUE-370 IOPAMIDOL (ISOVUE-370) INJECTION 76% COMPARISON:  CT 03/02/2017  FINDINGS: Cardiovascular: No filling defects in the pulmonary arteries to suggest acute pulmonary embolism. No acute findings aorta great vessels. No pericardial effusion Mediastinum/Nodes: No axillary supraclavicular adenopathy. There is bilateral infrahilar adenopathy. For example 13 mm RIGHT infrahilar node on image 168, series 6. 13 mm LEFT infrahilar node on image 134, series 6. Lungs/Pleura: There is bibasilar mild airspace disease as well as mild airspace disease in the posterior aspect of the LEFT upper lobe with air bronchograms. Upper lungs are clear. Upper Abdomen: Limited view of the liver, kidneys, pancreas are unremarkable. Normal adrenal glands. Musculoskeletal: No aggressive osseous lesion Review of the MIP images confirms the above findings. IMPRESSION: 1. No evidence acute pulmonary embolism. 2. Bibasilar mild airspace disease and mild consolidation in the posterior aspect of the lingula /LEFT upper lobe. Findings concerning for MULTIFOCAL PNEUMONIA. Aspiration pneumonitis or edema less favored. 3. Bilateral infrahilar adenopathy likely reactive and related to the airspace disease above. Electronically Signed   By: Genevive Bi M.D.   On: 03/07/2017 20:51   Dg Swallowing Func-speech  Pathology  Result Date: 03/08/2017 Objective Swallowing Evaluation: Type of Study: MBS-Modified Barium Swallow Study  Patient Details Name: TRISTON SKARE MRN: [id number] Date of Birth: 05/25/1955 Today's Date: 03/08/2017 Time: SLP Start Time (ACUTE ONLY): 1133 -SLP Stop Time (ACUTE ONLY): 1148 SLP Time Calculation (min) (ACUTE ONLY): 15 min Past Medical History: Past Medical History: Diagnosis Date . ADD (attention deficit disorder)  . Anxiety   Panic . Bipolar disorder (HCC)  . GERD (gastroesophageal reflux disease)   ? they thought GERD caused cough.  . Mental disorder   ADD, bipolar . Pneumonia 04/12/2013 . Shortness of breath  . Substance abuse [facility])  Past Surgical History: Past Surgical History: Procedure  Laterality Date . APPENDECTOMY  1988 . COLONOSCOPY   . TONSILLECTOMY   . VIDEO BRONCHOSCOPY Bilateral 10/10/2013  Procedure: VIDEO BRONCHOSCOPY WITH FLUORO;  Surgeon: Oretha Milch, MD;  Location: [facility] ENDOSCOPY;  Service: Cardiopulmonary;  Laterality: Bilateral; . VIDEO BRONCHOSCOPY N/A 10/13/2013  Procedure: VIDEO BRONCHOSCOPY, REMOVAL OF ENDOBRONCHIAL LESION, PROBABLE FOREIGN BODY;  Surgeon: Delight Ovens, MD;  Location: [facility] OR;  Service: Thoracic;  Laterality: N/A; . VIDEO BRONCHOSCOPY N/A 11/28/2013  Procedure: VIDEO BRONCHOSCOPY;  Surgeon: Delight Ovens, MD;  Location: [facility] OR;  Service: Thoracic;  Laterality: N/A;  bronchoscopy for removal of foreign body HPI: This is a 62 year old obese man with COPD, chronic cough, bipolar d/o, PNA, diaphragmatic paralysis, OSA on CPAP, major depressive disorder, hypertension, GERD who reports seeking medical attention for ongoing coughing spells associated with shortness of breath, frequent panic attacks. MD questioning aspiration in the setting of untreated GERD. MBS complete 10/02/13, normal oropharyngeal swallow. Bronchoscopy complete 09/2013 however noted foreign body aspiration in lower right lobe with f/o consultation and bronch complete by cardiothoracic surgeon removing and confirming foreign body as "vegetable material." Chest CT 03/07/17: Bibasilar mild airspace disease and mild consolidation in the posterior aspect of the lingula /LEFT upper lobe. Findings concerning for MULTIFOCAL PNEUMONIA. Aspiration pneumonitis or edema less favored.  No Data Recorded Assessment / Plan / Recommendation CHL IP CLINICAL IMPRESSIONS 03/08/2017 Clinical Impression Patient presents with a normal oropharyngeal swallow with full airway protection and pharyngeal clearance. Question of a small CP bar noted. This did not impact clearance of bolus or results in any episodes of backflow during today's study even when challenged with multiple large consecutive boluses, however cannot r/o  impact the larger quanitites of po over the course of a meal or with improper positioning. Education complete regarding general safe swallowing precautions. No further SLP needs indicated. Defer further eosphageal w/u or intervention to MD if feel necessary based on additional w/u for recurrent PNA. At the least, question if PPI would be beneficial.  SLP Visit Diagnosis Dysphagia, unspecified (R13.10) Attention and concentration deficit following -- Frontal lobe and executive function deficit following -- Impact on safety and function Mild aspiration risk   CHL IP TREATMENT RECOMMENDATION 03/08/2017 Treatment Recommendations No treatment recommended at this time   Prognosis 10/02/2013 Prognosis for Safe Diet Advancement Good Barriers to Reach Goals -- Barriers/Prognosis Comment -- CHL IP DIET RECOMMENDATION 03/08/2017 SLP Diet Recommendations Regular solids;Thin liquid Liquid Administration via Cup;Straw Medication Administration Whole meds with liquid Compensations Slow rate;Small sips/bites;Follow solids with liquid Postural Changes Seated upright at 90 degrees;Remain semi-upright after after feeds/meals (Comment)   CHL IP OTHER RECOMMENDATIONS 03/08/2017 Recommended Consults Consider esophageal assessment Oral Care Recommendations Oral care BID Other Recommendations --   CHL IP FOLLOW UP RECOMMENDATIONS 03/08/2017 Follow up Recommendations None   No flowsheet data  found.     CHL IP ORAL PHASE 03/08/2017 Oral Phase WFL Oral - Pudding Teaspoon -- Oral - Pudding Cup -- Oral - Honey Teaspoon -- Oral - Honey Cup -- Oral - Nectar Teaspoon -- Oral - Nectar Cup -- Oral - Nectar Straw -- Oral - Thin Teaspoon -- Oral - Thin Cup -- Oral - Thin Straw -- Oral - Puree -- Oral - Mech Soft -- Oral - Regular -- Oral - Multi-Consistency -- Oral - Pill -- Oral Phase - Comment --  CHL IP PHARYNGEAL PHASE 03/08/2017 Pharyngeal Phase WFL Pharyngeal- Pudding Teaspoon -- Pharyngeal -- Pharyngeal- Pudding Cup -- Pharyngeal -- Pharyngeal- Honey  Teaspoon -- Pharyngeal -- Pharyngeal- Honey Cup -- Pharyngeal -- Pharyngeal- Nectar Teaspoon -- Pharyngeal -- Pharyngeal- Nectar Cup -- Pharyngeal -- Pharyngeal- Nectar Straw -- Pharyngeal -- Pharyngeal- Thin Teaspoon -- Pharyngeal -- Pharyngeal- Thin Cup -- Pharyngeal -- Pharyngeal- Thin Straw -- Pharyngeal -- Pharyngeal- Puree -- Pharyngeal -- Pharyngeal- Mechanical Soft -- Pharyngeal -- Pharyngeal- Regular -- Pharyngeal -- Pharyngeal- Multi-consistency -- Pharyngeal -- Pharyngeal- Pill -- Pharyngeal -- Pharyngeal Comment --  CHL IP CERVICAL ESOPHAGEAL PHASE 03/08/2017 Cervical Esophageal Phase WFL Pudding Teaspoon -- Pudding Cup -- Honey Teaspoon -- Honey Cup -- Nectar Teaspoon -- Nectar Cup -- Nectar Straw -- Thin Teaspoon -- Thin Cup -- Thin Straw -- Puree -- Mechanical Soft -- Regular -- Multi-consistency -- Pill -- Cervical Esophageal Comment -- CHL IP GO 10/02/2013 Functional Assessment Tool Used ASHA NOMS, clinical judgment Functional Limitations Swallowing Swallow Current Status (Z6109) CH Swallow Goal Status (U0454) [facility] Swallow Discharge Status (U9811) CH Motor Speech Current Status (B1478) (None) Motor Speech Goal Status (G9562) (None) Motor Speech Goal Status (Z3086) (None) Spoken Language Comprehension Current Status (V7846) (None) Spoken Language Comprehension Goal Status (N6295) (None) Spoken Language Comprehension Discharge Status [phone]) (None) Spoken Language Expression Current Status (K4401) (None) Spoken Language Expression Goal Status (U2725) (None) Spoken Language Expression Discharge Status [phone]) (None) Attention Current Status (I3474) (None) Attention Goal Status (Q5956) (None) Attention Discharge Status (L8756) (None) Memory Current Status (E3329) (None) Memory Goal Status (J1884) (None) Memory Discharge Status (Z6606) (None) Voice Current Status (T0160) (None) Voice Goal Status (F0932) (None) Voice Discharge Status (T5573) (None) Other Speech-Language Pathology Functional Limitation Current  Status (U2025) (None) Other Speech-Language Pathology Functional Limitation Goal Status (K2706) (None) Other Speech-Language Pathology Functional Limitation Discharge Status [phone]) (None) Ferdinand Lango MA, CCC-SLP [phone] McCoy Leah Meryl 03/08/2017, 11:56 AM               ASSESSMENT / PLAN:  Recurrent pneumonia Recurrent cough Intermittent hemoptysis Left hemidiaphragm paralysis History of COPD History of sleep apnea History of gastroesophageal reflux disease   Recurrent pneumonia with associated cough and intermittent hemoptysis Review of clinic notes raising concern for ongoing aspiration.  Swallowing evaluation was completed without evidence of aspiration however certainly this would not rule out esophageal dysmotility and aspiration in the setting.  Also has a history of diaphragmatic analysis on the left and chronic scarring.  Given the clinical course of recurrent exacerbation that improved some with antibiotics, then worsened once again after completion this would also raise concern about resistant organism.  Less likely autoimmune in nature  Plan/rec  Continue current antibiotics Continue PPI Will obtain esophagram, if has significant esophageal dysmotility certainly silent aspiration would be a consideration Consider bronchoscopy given concern for possible resistent organism but doubt that this would be of much help given he's already seen abx Add pulm hygiene measures  Consider sending autoimmune workup  but address above first  Simonne Martinet ACNP-BC [facility] Pulmonary/Critical Care Pager # [phone] OR # [phone] if no answer  03/08/2017, 3:34 PM  Attending Note:  62 year old male with history of aspiration who presents to the hospital with recurrent pneumonia.  History of prior FB removal by CVTS and testing to prove a paralyzed left hemidiaphragm.  On exam, decreased BS L>R.  I reviewed CT myself, new infiltrate noted on the left with an elevated hemidiaphragm.   Discussed with PCCM-NP.  PNA:  - Continue abx as ordered  - F/u on cultures  - Repeat CT in 4-6 wks after abx  - If persists may consider a bronchoscopy and repeat culture for bacterial resistance  Dysphagia:  - Esophagogram ordered  Hypoxemia:  - Titrate O2 to off at this point  Diaphragmatic paralysis:  - IS  - Flutter valve  - May consider a diaphragm stimulator if becomes more of an issue  PCCM will follow  Patient seen and examined, agree with above note.  I dictated the care and orders written for this patient under my direction.  Alyson Reedy, MD [phone]

## 2017-03-09 ENCOUNTER — Ambulatory Visit: Payer: BLUE CROSS/BLUE SHIELD | Admitting: Emergency Medicine

## 2017-03-09 ENCOUNTER — Inpatient Hospital Stay (HOSPITAL_COMMUNITY): Payer: BLUE CROSS/BLUE SHIELD

## 2017-03-09 DIAGNOSIS — R0600 Dyspnea, unspecified: Secondary | ICD-10-CM

## 2017-03-09 DIAGNOSIS — R05 Cough: Secondary | ICD-10-CM

## 2017-03-09 DIAGNOSIS — J471 Bronchiectasis with (acute) exacerbation: Secondary | ICD-10-CM

## 2017-03-09 LAB — ECHOCARDIOGRAM COMPLETE
Height: 70 in
Weight: 4409.2 oz

## 2017-03-09 MED ORDER — ACETYLCYSTEINE 20 % IN SOLN
4.0000 mL | Freq: Four times a day (QID) | RESPIRATORY_TRACT | Status: DC
  Filled 2017-03-09 (×5): qty 4

## 2017-03-09 MED ORDER — HYDROCOD POLST-CPM POLST ER 10-8 MG/5ML PO SUER
5.0000 mL | Freq: Every evening | ORAL | Status: DC | PRN
  Administered 2017-03-09: 5 mL via ORAL
  Filled 2017-03-09: qty 5

## 2017-03-09 MED ORDER — HYDROCOD POLST-CPM POLST ER 10-8 MG/5ML PO SUER
5.0000 mL | Freq: Once | ORAL | Status: AC
  Administered 2017-03-09: 5 mL via ORAL
  Filled 2017-03-09: qty 5

## 2017-03-09 MED ORDER — PERFLUTREN LIPID MICROSPHERE
1.0000 mL | INTRAVENOUS | Status: AC | PRN
  Administered 2017-03-09: 4 mL via INTRAVENOUS
  Filled 2017-03-09: qty 10

## 2017-03-09 NOTE — Progress Notes (Signed)
Pt refused bath offered by NT earlier. Importance of daily baths associated with infection prevention explain to pt by this RN. Pt agree to complete bath in am, does not take daily baths due to dry skin. Lotion suggested by [employer].

## 2017-03-09 NOTE — Telephone Encounter (Signed)
Spoke with pt's spouse, aware of recs.  Nothing further needed.  

## 2017-03-09 NOTE — Progress Notes (Signed)
RT went to place pt on CPAP pt has home machine.  Home CPAP clean, no frayed wires, sterile water added and within pt reach.

## 2017-03-09 NOTE — Progress Notes (Signed)
Held Mucomyst and sputum collection ( pt has continued dry, NPC at this time) 2' Bronchoscopy in AM to collect cultures and assess lung airways.

## 2017-03-09 NOTE — Telephone Encounter (Signed)
I reviewed his CT scan from 1/15 and also the CT scan that was done during the hospital admission.  The CT scan from 1/15 does not show any infiltrates, there was a pulmonary nodule that would followed with serial scans.  The new CT scan confirms that he has left-sided infiltrates consistent with a pneumonia.

## 2017-03-09 NOTE — Progress Notes (Signed)
PROGRESS NOTE    [name]  ZOX:[id number] DOB: 12/02/1955 DOA: 03/07/2017 PCP: Shelva Majestic, MD   Outpatient Specialists:     Brief Narrative:  This is a 62 year old obese man with COPD, chronic cough, diaphragmatic paralysis, OSA on CPAP, major depressive disorder, hypertension, GERD who reports seeking medical attention for ongoing coughing spells associated with shortness of breath.  History obtained via review of the EMR as well as discussion with the emergency medicine team, in conjunction with history from the patient and his family. They report that since November 2018 his primary symptoms have included coughing spells and worsening shortness of breath. He is been treated with fluoroquinolone therapy as well as cough suppressants and multiple treatments of prednisone. Over the past week he reports ongoing weakness and shortness of breath, frequently will have panic attacks. He reports a cough productive of cloudy sputum, at times associated with some blood. He reports amount of blood-tinged sputum was less than a tablespoon. He reports some subjective fever and chills. He reports pleuritic right-sided chest pain.  Other details include he lives with his wife, he is retired, independent in his ADLs. He reports having a alcoholic beverage every other day, regarding his major depression he reports that his symptoms are persistent, he has received ECT as well as TMS for treatment.     Assessment & Plan:   Active Problems:   Pneumonia   Pneumonia, bacterial likely He has failed outpatient management (prednisone, po antimicrobial therapy, cough suppressants) now with  CT evidence of multifocal pneumonia. -normal esophogram -appreciate PCCM eval: plan for bronch-- chronic cough and congestion is related to bronchiectasis   COPD:  -defer to pulm  GERD: protonix  OSA: CPAP qhs  Depression: continue home SSRI therapy, gabapentin  Hx of anemia: continue home po iron  supplement     DVT prophylaxis:  SCD's  Code Status: Full Code   Family Communication:   Disposition Plan:     Consultants:      Subjective: Coughing with exertion continues  Objective: Vitals:   03/09/17 0715 03/09/17 0914 03/09/17 1133 03/09/17 1314  BP:  (!) 145/90  138/77  Pulse:  74  89  Resp:    18  Temp:    97.8 F (36.6 C)  TempSrc:    Oral  SpO2: 92%  97% 98%  Weight:      Height:        Intake/Output Summary (Last 24 hours) at 03/09/2017 1453 Last data filed at 03/09/2017 0513 Gross per 24 hour  Intake 500 ml  Output 300 ml  Net 200 ml   Filed Weights   03/08/17 0417  Weight: 125 kg (275 lb 9.2 oz)    Examination:  General exam: Appears calm and comfortable - in chair on O2 Respiratory system: no wheezing, diminished Cardiovascular system: rrr Gastrointestinal system: +Bs, soft Central nervous system: alert Extremities: moves all 4 ext     Data Reviewed: I have personally reviewed following labs and imaging studies  CBC: Recent Labs  Lab 03/07/17 1829 03/08/17 0324  WBC 13.6* 11.2*  HGB 12.0* 10.5*  HCT 36.3* 32.3*  MCV 83.8 83.2  PLT 232 222   Basic Metabolic Panel: Recent Labs  Lab 03/07/17 1829 03/08/17 0324  NA 136 137  K 4.1 4.3  CL 101 105  CO2 24 23  GLUCOSE 106* 108*  BUN 10 11  CREATININE 0.85 0.92  CALCIUM 8.9 8.4*   GFR: Estimated Creatinine Clearance: 109 mL/min (by C-G formula  based on SCr of 0.92 mg/dL). Liver Function Tests: No results for input(s): AST, ALT, ALKPHOS, BILITOT, PROT, ALBUMIN in the last 168 hours. No results for input(s): LIPASE, AMYLASE in the last 168 hours. No results for input(s): AMMONIA in the last 168 hours. Coagulation Profile: No results for input(s): INR, PROTIME in the last 168 hours. Cardiac Enzymes: No results for input(s): CKTOTAL, CKMB, CKMBINDEX, TROPONINI in the last 168 hours. BNP (last 3 results) No results for input(s): PROBNP in the last 8760  hours. HbA1C: No results for input(s): HGBA1C in the last 72 hours. CBG: No results for input(s): GLUCAP in the last 168 hours. Lipid Profile: No results for input(s): CHOL, HDL, LDLCALC, TRIG, CHOLHDL, LDLDIRECT in the last 72 hours. Thyroid Function Tests: No results for input(s): TSH, T4TOTAL, FREET4, T3FREE, THYROIDAB in the last 72 hours. Anemia Panel: No results for input(s): VITAMINB12, FOLATE, FERRITIN, TIBC, IRON, RETICCTPCT in the last 72 hours. Urine analysis:    Component Value Date/Time   COLORURINE YELLOW 11/05/2016 1701   APPEARANCEUR CLEAR 11/05/2016 1701   LABSPEC 1.015 11/05/2016 1701   PHURINE 7.5 11/05/2016 1701   GLUCOSEU NEGATIVE 11/05/2016 1701   HGBUR NEGATIVE 11/05/2016 1701   BILIRUBINUR NEGATIVE 11/05/2016 1701   BILIRUBINUR n 07/09/2016 1639   KETONESUR NEGATIVE 11/05/2016 1701   PROTEINUR n 07/09/2016 1639   PROTEINUR NEGATIVE 07/28/2011 0806   UROBILINOGEN 1.0 11/05/2016 1701   NITRITE NEGATIVE 11/05/2016 1701   LEUKOCYTESUR NEGATIVE 11/05/2016 1701     Recent Results (from the past 240 hour(s))  Culture, blood (Routine X 2) w Reflex to ID Panel     Status: None (Preliminary result)   Collection Time: 03/07/17  7:15 PM  Result Value Ref Range Status   Specimen Description BLOOD RIGHT HAND  Final   Special Requests   Final    BOTTLES DRAWN AEROBIC AND ANAEROBIC Blood Culture adequate volume   Culture NO GROWTH 1 DAY  Final   Report Status PENDING  Incomplete  Respiratory Panel by PCR     Status: None   Collection Time: 03/08/17  4:51 AM  Result Value Ref Range Status   Adenovirus NOT DETECTED NOT DETECTED Final   Coronavirus 229E NOT DETECTED NOT DETECTED Final   Coronavirus HKU1 NOT DETECTED NOT DETECTED Final   Coronavirus NL63 NOT DETECTED NOT DETECTED Final   Coronavirus OC43 NOT DETECTED NOT DETECTED Final   Metapneumovirus NOT DETECTED NOT DETECTED Final   Rhinovirus / Enterovirus NOT DETECTED NOT DETECTED Final   Influenza A NOT  DETECTED NOT DETECTED Final   Influenza B NOT DETECTED NOT DETECTED Final   Parainfluenza Virus 1 NOT DETECTED NOT DETECTED Final   Parainfluenza Virus 2 NOT DETECTED NOT DETECTED Final   Parainfluenza Virus 3 NOT DETECTED NOT DETECTED Final   Parainfluenza Virus 4 NOT DETECTED NOT DETECTED Final   Respiratory Syncytial Virus NOT DETECTED NOT DETECTED Final   Bordetella pertussis NOT DETECTED NOT DETECTED Final   Chlamydophila pneumoniae NOT DETECTED NOT DETECTED Final   Mycoplasma pneumoniae NOT DETECTED NOT DETECTED Final      Anti-infectives (From admission, onward)   Start     Dose/Rate Route Frequency Ordered Stop   03/08/17 1100  vancomycin (VANCOCIN) IVPB 1000 mg/200 mL premix     1,000 mg 200 mL/hr over 60 Minutes Intravenous Every 12 hours 03/07/17 2206     03/08/17 0600  ceFEPIme (MAXIPIME) 1 g in dextrose 5 % 50 mL IVPB     1 g 100 mL/hr  over 30 Minutes Intravenous Every 8 hours 03/08/17 0124     03/07/17 2230  vancomycin (VANCOCIN) 2,500 mg in sodium chloride 0.9 % 500 mL IVPB     2,500 mg 250 mL/hr over 120 Minutes Intravenous  Once 03/07/17 2206 03/08/17 0250   03/07/17 2200  ceFEPIme (MAXIPIME) 2 g in dextrose 5 % 50 mL IVPB     2 g 100 mL/hr over 30 Minutes Intravenous  Once 03/07/17 2157 03/08/17 0013       Radiology Studies: Ct Angio Chest Pe W And/or Wo Contrast  Result Date: 03/07/2017 CLINICAL DATA:  Short of breath.  COPD.  Insert treatment EXAM: CT ANGIOGRAPHY CHEST WITH CONTRAST TECHNIQUE: Multidetector CT imaging of the chest was performed using the standard protocol during bolus administration of intravenous contrast. Multiplanar CT image reconstructions and MIPs were obtained to evaluate the vascular anatomy. CONTRAST:  100mL ISOVUE-370 IOPAMIDOL (ISOVUE-370) INJECTION 76% COMPARISON:  CT 03/02/2017 FINDINGS: Cardiovascular: No filling defects in the pulmonary arteries to suggest acute pulmonary embolism. No acute findings aorta great vessels. No  pericardial effusion Mediastinum/Nodes: No axillary supraclavicular adenopathy. There is bilateral infrahilar adenopathy. For example 13 mm RIGHT infrahilar node on image 168, series 6. 13 mm LEFT infrahilar node on image 134, series 6. Lungs/Pleura: There is bibasilar mild airspace disease as well as mild airspace disease in the posterior aspect of the LEFT upper lobe with air bronchograms. Upper lungs are clear. Upper Abdomen: Limited view of the liver, kidneys, pancreas are unremarkable. Normal adrenal glands. Musculoskeletal: No aggressive osseous lesion Review of the MIP images confirms the above findings. IMPRESSION: 1. No evidence acute pulmonary embolism. 2. Bibasilar mild airspace disease and mild consolidation in the posterior aspect of the lingula /LEFT upper lobe. Findings concerning for MULTIFOCAL PNEUMONIA. Aspiration pneumonitis or edema less favored. 3. Bilateral infrahilar adenopathy likely reactive and related to the airspace disease above. Electronically Signed   By: Genevive BiStewart  Edmunds M.D.   On: 03/07/2017 20:51   Dg Esophagus  Result Date: 03/09/2017 CLINICAL DATA:  Coughing, recent aspiration pneumonia. Normal swallow study yesterday. EXAM: ESOPHOGRAM / BARIUM SWALLOW / BARIUM TABLET STUDY TECHNIQUE: Combined double contrast and single contrast examination performed using effervescent crystals, thick barium liquid, and thin barium liquid. The patient was observed with fluoroscopy swallowing a 13 mm barium sulphate tablet. FLUOROSCOPY TIME:  Fluoroscopy Time:  1 minute, 48 seconds. Radiation Exposure Index (if provided by the fluoroscopic device): 32.0 mGy. Number of Acquired Spot Images: 0 COMPARISON:  CT chest dated March 07, 2017. FINDINGS: A double contrast study was undertaken and the patient tolerated this well and without difficulty. Primary peristaltic waves in the esophagus were normal. No obstruction to the forward flow of contrast throughout the esophagus and into the stomach.  Normal esophageal course and contour. Normal esophageal mucosal pattern. No esophageal stricture, ulceration, or other significant abnormality. A 13 mm barium tablet passed without difficulty into the stomach. IMPRESSION: Normal barium esophagram. Electronically Signed   By: Obie DredgeWilliam T Derry M.D.   On: 03/09/2017 10:48   Dg Swallowing Func-speech Pathology  Result Date: 03/08/2017 Objective Swallowing Evaluation: Type of Study: MBS-Modified Barium Swallow Study  Patient Details Name: Michaelyn BarterJohn B Sasaki MRN: [id number] Date of Birth: [birth year] Today's Date: 03/08/2017 Time: SLP Start Time (ACUTE ONLY): 1133 -SLP Stop Time (ACUTE ONLY): 1148 SLP Time Calculation (min) (ACUTE ONLY): 15 min Past Medical History: Past Medical History: Diagnosis Date . ADD (attention deficit disorder)  . Anxiety   Panic . Bipolar disorder (HCC)  .  GERD (gastroesophageal reflux disease)   ? they thought GERD caused cough.  . Mental disorder   ADD, bipolar . Pneumonia 04/12/2013 . Shortness of breath  . Substance abuse [facility])  Past Surgical History: Past Surgical History: Procedure Laterality Date . APPENDECTOMY  1988 . COLONOSCOPY   . TONSILLECTOMY   . VIDEO BRONCHOSCOPY Bilateral 10/10/2013  Procedure: VIDEO BRONCHOSCOPY WITH FLUORO;  Surgeon: Oretha Milch, MD;  Location: [facility] ENDOSCOPY;  Service: Cardiopulmonary;  Laterality: Bilateral; . VIDEO BRONCHOSCOPY N/A 10/13/2013  Procedure: VIDEO BRONCHOSCOPY, REMOVAL OF ENDOBRONCHIAL LESION, PROBABLE FOREIGN BODY;  Surgeon: Delight Ovens, MD;  Location: [facility] OR;  Service: Thoracic;  Laterality: N/A; . VIDEO BRONCHOSCOPY N/A 11/28/2013  Procedure: VIDEO BRONCHOSCOPY;  Surgeon: Delight Ovens, MD;  Location: [facility] OR;  Service: Thoracic;  Laterality: N/A;  bronchoscopy for removal of foreign body HPI: This is a 62 year old obese man with COPD, chronic cough, bipolar d/o, PNA, diaphragmatic paralysis, OSA on CPAP, major depressive disorder, hypertension, GERD who reports seeking medical attention for  ongoing coughing spells associated with shortness of breath, frequent panic attacks. MD questioning aspiration in the setting of untreated GERD. MBS complete 10/02/13, normal oropharyngeal swallow. Bronchoscopy complete 09/2013 however noted foreign body aspiration in lower right lobe with f/o consultation and bronch complete by cardiothoracic surgeon removing and confirming foreign body as "vegetable material." Chest CT 03/07/17: Bibasilar mild airspace disease and mild consolidation in the posterior aspect of the lingula /LEFT upper lobe. Findings concerning for MULTIFOCAL PNEUMONIA. Aspiration pneumonitis or edema less favored.  No Data Recorded Assessment / Plan / Recommendation CHL IP CLINICAL IMPRESSIONS 03/08/2017 Clinical Impression Patient presents with a normal oropharyngeal swallow with full airway protection and pharyngeal clearance. Question of a small CP bar noted. This did not impact clearance of bolus or results in any episodes of backflow during today's study even when challenged with multiple large consecutive boluses, however cannot r/o impact the larger quanitites of po over the course of a meal or with improper positioning. Education complete regarding general safe swallowing precautions. No further SLP needs indicated. Defer further eosphageal w/u or intervention to MD if feel necessary based on additional w/u for recurrent PNA. At the least, question if PPI would be beneficial.  SLP Visit Diagnosis Dysphagia, unspecified (R13.10) Attention and concentration deficit following -- Frontal lobe and executive function deficit following -- Impact on safety and function Mild aspiration risk   CHL IP TREATMENT RECOMMENDATION 03/08/2017 Treatment Recommendations No treatment recommended at this time   Prognosis 10/02/2013 Prognosis for Safe Diet Advancement Good Barriers to Reach Goals -- Barriers/Prognosis Comment -- CHL IP DIET RECOMMENDATION 03/08/2017 SLP Diet Recommendations Regular solids;Thin liquid  Liquid Administration via Cup;Straw Medication Administration Whole meds with liquid Compensations Slow rate;Small sips/bites;Follow solids with liquid Postural Changes Seated upright at 90 degrees;Remain semi-upright after after feeds/meals (Comment)   CHL IP OTHER RECOMMENDATIONS 03/08/2017 Recommended Consults Consider esophageal assessment Oral Care Recommendations Oral care BID Other Recommendations --   CHL IP FOLLOW UP RECOMMENDATIONS 03/08/2017 Follow up Recommendations None   No flowsheet data found.     CHL IP ORAL PHASE 03/08/2017 Oral Phase WFL Oral - Pudding Teaspoon -- Oral - Pudding Cup -- Oral - Honey Teaspoon -- Oral - Honey Cup -- Oral - Nectar Teaspoon -- Oral - Nectar Cup -- Oral - Nectar Straw -- Oral - Thin Teaspoon -- Oral - Thin Cup -- Oral - Thin Straw -- Oral - Puree -- Oral - Mech Soft -- Oral - Regular -- Oral -  Multi-Consistency -- Oral - Pill -- Oral Phase - Comment --  CHL IP PHARYNGEAL PHASE 03/08/2017 Pharyngeal Phase WFL Pharyngeal- Pudding Teaspoon -- Pharyngeal -- Pharyngeal- Pudding Cup -- Pharyngeal -- Pharyngeal- Honey Teaspoon -- Pharyngeal -- Pharyngeal- Honey Cup -- Pharyngeal -- Pharyngeal- Nectar Teaspoon -- Pharyngeal -- Pharyngeal- Nectar Cup -- Pharyngeal -- Pharyngeal- Nectar Straw -- Pharyngeal -- Pharyngeal- Thin Teaspoon -- Pharyngeal -- Pharyngeal- Thin Cup -- Pharyngeal -- Pharyngeal- Thin Straw -- Pharyngeal -- Pharyngeal- Puree -- Pharyngeal -- Pharyngeal- Mechanical Soft -- Pharyngeal -- Pharyngeal- Regular -- Pharyngeal -- Pharyngeal- Multi-consistency -- Pharyngeal -- Pharyngeal- Pill -- Pharyngeal -- Pharyngeal Comment --  CHL IP CERVICAL ESOPHAGEAL PHASE 03/08/2017 Cervical Esophageal Phase WFL Pudding Teaspoon -- Pudding Cup -- Honey Teaspoon -- Honey Cup -- Nectar Teaspoon -- Nectar Cup -- Nectar Straw -- Thin Teaspoon -- Thin Cup -- Thin Straw -- Puree -- Mechanical Soft -- Regular -- Multi-consistency -- Pill -- Cervical Esophageal Comment -- CHL IP GO  10/02/2013 Functional Assessment Tool Used ASHA NOMS, clinical judgment Functional Limitations Swallowing Swallow Current Status (A5409) CH Swallow Goal Status (W1191) [facility] Swallow Discharge Status (Y7829) CH Motor Speech Current Status (F6213) (None) Motor Speech Goal Status (Y8657) (None) Motor Speech Goal Status (Q4696) (None) Spoken Language Comprehension Current Status (E9528) (None) Spoken Language Comprehension Goal Status (U1324) (None) Spoken Language Comprehension Discharge Status (M0102) (None) Spoken Language Expression Current Status (V2536) (None) Spoken Language Expression Goal Status (U4403) (None) Spoken Language Expression Discharge Status [phone] (None) Attention Current Status (Z5638) (None) Attention Goal Status (V5643) (None) Attention Discharge Status (P2951) (None) Memory Current Status (O8416) (None) Memory Goal Status (S0630) (None) Memory Discharge Status (Z6010) (None) Voice Current Status (X3235) (None) Voice Goal Status (T7322) (None) Voice Discharge Status (G2542) (None) Other Speech-Language Pathology Functional Limitation Current Status (H0623) (None) Other Speech-Language Pathology Functional Limitation Goal Status (J6283) (None) Other Speech-Language Pathology Functional Limitation Discharge Status [phone]) (None) Leah McCoy MA, CCC-SLP [phone] McCoy Leah Meryl 03/08/2017, 11:56 AM                   Scheduled Meds: . acetylcysteine  4 mL Nebulization Q6H  . albuterol  5 mg Nebulization Once  . ferrous sulfate  325 mg Oral QHS  . FLUoxetine  60 mg Oral QHS  . gabapentin  800 mg Oral TID  . ipratropium-albuterol  3 mL Nebulization QID  . losartan  100 mg Oral Daily  . pantoprazole  40 mg Oral BID  . traZODone  50 mg Oral QHS   Continuous Infusions: . ceFEPime (MAXIPIME) IV Stopped (03/09/17 1340)  . vancomycin Stopped (03/09/17 1302)     LOS: 1 day    Time spent: 35 min    Joseph Art, DO [facility] Pager [phone]  If 7PM-7AM, please  contact night-coverage [email or web address] Password [facility] 03/09/2017, 2:53 PM

## 2017-03-09 NOTE — Telephone Encounter (Signed)
RB please advise on CT results.  Thanks!  

## 2017-03-09 NOTE — Progress Notes (Signed)
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  He is followed in our clinic by Dr. Delton Coombes.  Since approximately November he has been treated approximately 4-5 times for recurrent respiratory tract infections utilizing antibiotics and steroid tapers.  Most recently seen in our office on 1/10 for slow to resolve COPD exacerbation.  At that time he was instructed to continue slow prednisone taper, continue bronchodilators, also prescribed cough suppression and CT of chest was ordered.  CT chest at that time showed no acute cardiopulmonary problems however did have  chronic appearing scarring within the anterior right lower lobe and posterior left lower lobe.  Since his last visit in our office his cough is been essentially persistent.  He reports it at times is clear then sometimes streaked with blood.  He has had subjective fever, chills, he reports he is coughed so hard that he had almost fallen at home.  This is what drove him to being evaluated at the hospital.  In the ER he was found to be tachypneic, he had a mild leukocytosis of 13.6, and complained of left-sided chest discomfort.  He reports that he has a sensation that his left chest feels "boggy" as if the left chest is just "followed by infection".  A CT of chest was obtained once again in the emergency room on 1/20: This was negative for acute pulmonary emboli but did show bibasilar and multifocal airspace disease.  Pulmonary was asked to evaluate given recurrent pneumonia   SIGNIFICANT EVENTS   STUDIES:  CT chest 1/20 > No PE, bibasilar airspace disease, probable multifocal PNA, b/l infrahilar  adenopathy. 1/21 Modified barium swallow > normal. Esophogram 1/22 > normal. Echo 1/22 >   SUBJECTIVE:  No acute events.  Breathing about the same, maybe a little bit better.  Anxious to get discharged before his appointment with [facility] on 1/25.  VITAL SIGNS: Temp:  [98.3 F (36.8 C)-99.1 F (37.3 C)] 98.3 F (36.8 C) (01/22 0521) Pulse Rate:  [66-76] 74 (01/22 0914) Resp:  [18-20] 18 (01/22 0521) BP: (144-160)/(80-96) 145/90 (01/22 0914) SpO2:  [92 %-97 %] 97 % (01/22 1133)  PHYSICAL EXAMINATION: General: Adult male, sitting in recliner, in NAD HEENT: Normocephalic atraumatic no jugular venous distention Cardiac: Regular rate and rhythm, no M/R/G Pulmonary: Diminished in bases but otherwise clear Abdomen: Soft nontender no warm dry brisk cap refill, 1+ pedal edema Neuro/psych: Awake alert and oriented no focal deficits  Recent Labs  Lab 03/07/17 1829 03/08/17 0324  NA 136 137  K 4.1 4.3  CL 101 105  CO2 24 23  BUN 10 11  CREATININE 0.85 0.92  GLUCOSE 106* 108*   Recent Labs  Lab 03/07/17 1829 03/08/17 0324  HGB 12.0* 10.5*  HCT 36.3* 32.3*  WBC 13.6* 11.2*  PLT 232 222   Ct Angio Chest Pe W And/or Wo Contrast  Result Date: 03/07/2017 CLINICAL DATA:  Short of breath.  COPD.  Insert treatment EXAM: CT ANGIOGRAPHY CHEST WITH CONTRAST TECHNIQUE: Multidetector CT imaging of the chest was performed using the standard protocol during bolus administration of intravenous contrast. Multiplanar CT image reconstructions and MIPs were obtained to evaluate the vascular anatomy. CONTRAST:  ISOVUE-370 IOPAMIDOL (ISOVUE-370) INJECTION 76% COMPARISON:  CT 03/02/2017 FINDINGS: Cardiovascular: No filling defects in the pulmonary arteries to suggest acute pulmonary embolism. No acute findings aorta great vessels. No pericardial effusion Mediastinum/Nodes: No axillary supraclavicular adenopathy. There is bilateral infrahilar adenopathy. For example 13 mm RIGHT infrahilar node on image  168, series 6. 13 mm LEFT infrahilar node on image 134, series 6. Lungs/Pleura: There is bibasilar mild airspace disease as well as mild airspace disease in the posterior aspect of the LEFT upper lobe with air bronchograms. Upper lungs are clear. Upper Abdomen: Limited view of the liver, kidneys, pancreas are unremarkable. Normal adrenal glands. Musculoskeletal: No aggressive osseous lesion Review of the MIP images confirms the above findings. IMPRESSION: 1. No evidence acute pulmonary embolism. 2. Bibasilar mild airspace disease and mild consolidation in the posterior aspect of the lingula /LEFT upper lobe. Findings concerning for MULTIFOCAL PNEUMONIA. Aspiration pneumonitis or edema less favored. 3. Bilateral infrahilar adenopathy likely reactive and related to the airspace disease above. Electronically Signed   By: Genevive Bi M.D.   On: 03/07/2017 20:51   Dg Esophagus  Result Date: 03/09/2017 CLINICAL DATA:  Coughing, recent aspiration pneumonia. Normal swallow study yesterday. EXAM: ESOPHOGRAM / BARIUM SWALLOW / BARIUM TABLET STUDY TECHNIQUE: Combined double contrast and single contrast examination performed using effervescent crystals, thick barium liquid, and thin barium liquid. The patient was observed with fluoroscopy swallowing a 13 mm barium sulphate tablet. FLUOROSCOPY TIME:  Fluoroscopy Time:  1 minute, 48 seconds. Radiation Exposure Index (if provided by the fluoroscopic device): 32.0 mGy. Number of Acquired Spot Images: 0 COMPARISON:  CT chest dated March 07, 2017. FINDINGS: A double contrast study was undertaken and the patient tolerated this well and without difficulty. Primary peristaltic waves in the esophagus were normal. No obstruction to the forward flow of contrast throughout the esophagus and into the stomach. Normal esophageal course and contour. Normal esophageal mucosal pattern. No esophageal stricture, ulceration, or other significant abnormality. A 13 mm barium tablet passed  without difficulty into the stomach. IMPRESSION: Normal barium esophagram. Electronically Signed   By: Obie Dredge M.D.   On: 03/09/2017 10:48   Dg Swallowing Func-speech Pathology  Result Date: 03/08/2017 Objective Swallowing Evaluation: Type of Study: MBS-Modified Barium Swallow Study  Patient Details Name: [name] MRN: [id number] Date of Birth: 01/05/1956 Today's Date: 03/08/2017 Time: SLP Start Time (ACUTE ONLY): 1133 -SLP Stop Time (ACUTE ONLY): 1148 SLP Time Calculation (min) (ACUTE ONLY): 15 min Past Medical History: Past Medical History: Diagnosis Date . ADD (attention deficit disorder)  . Anxiety   Panic . Bipolar disorder (HCC)  . GERD (gastroesophageal reflux disease)   ? they thought GERD caused cough.  . Mental disorder   ADD, bipolar . Pneumonia 04/12/2013 . Shortness of breath  . Substance abuse [facility])  Past Surgical History: Past Surgical History: Procedure Laterality Date . APPENDECTOMY  1988 . COLONOSCOPY   . TONSILLECTOMY   . VIDEO BRONCHOSCOPY Bilateral 10/10/2013  Procedure: VIDEO BRONCHOSCOPY WITH FLUORO;  Surgeon: Oretha Milch, MD;  Location: [facility] ENDOSCOPY;  Service: Cardiopulmonary;  Laterality: Bilateral; . VIDEO BRONCHOSCOPY N/A 10/13/2013  Procedure: VIDEO BRONCHOSCOPY, REMOVAL OF ENDOBRONCHIAL LESION, PROBABLE FOREIGN BODY;  Surgeon: Delight Ovens, MD;  Location: [facility] OR;  Service: Thoracic;  Laterality: N/A; . VIDEO BRONCHOSCOPY N/A 11/28/2013  Procedure: VIDEO BRONCHOSCOPY;  Surgeon: Delight Ovens, MD;  Location: [facility] OR;  Service: Thoracic;  Laterality: N/A;  bronchoscopy for removal of foreign body HPI: This is a 62 year old obese  man with COPD, chronic cough, bipolar d/o, PNA, diaphragmatic paralysis, OSA on CPAP, major depressive disorder, hypertension, GERD who reports seeking medical attention for ongoing coughing spells associated with shortness of breath, frequent panic attacks. MD questioning aspiration in the setting of untreated GERD. MBS complete 10/02/13, normal  oropharyngeal swallow. Bronchoscopy complete 09/2013 however noted foreign body aspiration in lower right lobe with f/o consultation and bronch complete by cardiothoracic surgeon removing and confirming foreign body as "vegetable material." Chest CT 03/07/17: Bibasilar mild airspace disease and mild consolidation in the posterior aspect of the lingula /LEFT upper lobe. Findings concerning for MULTIFOCAL PNEUMONIA. Aspiration pneumonitis or edema less favored.  No Data Recorded Assessment / Plan / Recommendation CHL IP CLINICAL IMPRESSIONS 03/08/2017 Clinical Impression Patient presents with a normal oropharyngeal swallow with full airway protection and pharyngeal clearance. Question of a small CP bar noted. This did not impact clearance of bolus or results in any episodes of backflow during today's study even when challenged with multiple large consecutive boluses, however cannot r/o impact the larger quanitites of po over the course of a meal or with improper positioning. Education complete regarding general safe swallowing precautions. No further SLP needs indicated. Defer further eosphageal w/u or intervention to MD if feel necessary based on additional w/u for recurrent PNA. At the least, question if PPI would be beneficial.  SLP Visit Diagnosis Dysphagia, unspecified (R13.10) Attention and concentration deficit following -- Frontal lobe and executive function deficit following -- Impact on safety and function Mild aspiration risk   CHL IP TREATMENT RECOMMENDATION 03/08/2017 Treatment Recommendations No treatment recommended at this time   Prognosis 10/02/2013 Prognosis for Safe Diet Advancement Good Barriers to Reach Goals -- Barriers/Prognosis Comment -- CHL IP DIET RECOMMENDATION 03/08/2017 SLP Diet Recommendations Regular solids;Thin liquid Liquid Administration via Cup;Straw Medication Administration Whole meds with liquid Compensations Slow rate;Small sips/bites;Follow solids with liquid Postural Changes Seated  upright at 90 degrees;Remain semi-upright after after feeds/meals (Comment)   CHL IP OTHER RECOMMENDATIONS 03/08/2017 Recommended Consults Consider esophageal assessment Oral Care Recommendations Oral care BID Other Recommendations --   CHL IP FOLLOW UP RECOMMENDATIONS 03/08/2017 Follow up Recommendations None   No flowsheet data found.     CHL IP ORAL PHASE 03/08/2017 Oral Phase WFL Oral - Pudding Teaspoon -- Oral - Pudding Cup -- Oral - Honey Teaspoon -- Oral - Honey Cup -- Oral - Nectar Teaspoon -- Oral - Nectar Cup -- Oral - Nectar Straw -- Oral - Thin Teaspoon -- Oral - Thin Cup -- Oral - Thin Straw -- Oral - Puree -- Oral - Mech Soft -- Oral - Regular -- Oral - Multi-Consistency -- Oral - Pill -- Oral Phase - Comment --  CHL IP PHARYNGEAL PHASE 03/08/2017 Pharyngeal Phase WFL Pharyngeal- Pudding Teaspoon -- Pharyngeal -- Pharyngeal- Pudding Cup -- Pharyngeal -- Pharyngeal- Honey Teaspoon -- Pharyngeal -- Pharyngeal- Honey Cup -- Pharyngeal -- Pharyngeal- Nectar Teaspoon -- Pharyngeal -- Pharyngeal- Nectar Cup -- Pharyngeal -- Pharyngeal- Nectar Straw -- Pharyngeal -- Pharyngeal- Thin Teaspoon -- Pharyngeal -- Pharyngeal- Thin Cup -- Pharyngeal -- Pharyngeal- Thin Straw -- Pharyngeal -- Pharyngeal- Puree -- Pharyngeal -- Pharyngeal- Mechanical Soft -- Pharyngeal -- Pharyngeal- Regular -- Pharyngeal -- Pharyngeal- Multi-consistency -- Pharyngeal -- Pharyngeal- Pill -- Pharyngeal -- Pharyngeal Comment --  CHL IP CERVICAL ESOPHAGEAL PHASE 03/08/2017 Cervical Esophageal Phase WFL Pudding Teaspoon -- Pudding Cup -- Honey Teaspoon -- Honey Cup -- Nectar Teaspoon -- Nectar Cup -- Nectar Straw -- Thin Teaspoon -- Thin Cup -- Thin Straw --  Puree -- Mechanical Soft -- Regular -- Multi-consistency -- Pill -- Cervical Esophageal Comment -- CHL IP GO 10/02/2013 Functional Assessment Tool Used ASHA NOMS, clinical judgment Functional Limitations Swallowing Swallow Current Status (H8469(G8996) CH Swallow Goal Status (G2952(G8997) [facility] Swallow  Discharge Status (W4132(G8998) CH Motor Speech Current Status (G4010(G8999) (None) Motor Speech Goal Status (U7253(G9186) (None) Motor Speech Goal Status (G6440(G9158) (None) Spoken Language Comprehension Current Status (H4742(G9159) (None) Spoken Language Comprehension Goal Status (V9563(G9160) (None) Spoken Language Comprehension Discharge Status [phone](G9161) (None) Spoken Language Expression Current Status [phone](G9162) (None) Spoken Language Expression Goal Status [phone](G9163) (None) Spoken Language Expression Discharge Status [phone](G9164) (None) Attention Current Status (K1601(G9165) (None) Attention Goal Status (U9323(G9166) (None) Attention Discharge Status [phone](G9167) (None) Memory Current Status (K0254(G9168) (None) Memory Goal Status (Y7062(G9169) (None) Memory Discharge Status (B7628(G9170) (None) Voice Current Status (B1517(G9171) (None) Voice Goal Status (O1607(G9172) (None) Voice Discharge Status [phone](G9173) (None) Other Speech-Language Pathology Functional Limitation Current Status [phone](G9174) (None) Other Speech-Language Pathology Functional Limitation Goal Status (N4627(G9175) (None) Other Speech-Language Pathology Functional Limitation Discharge Status [phone](G9176) (None) Ferdinand LangoLeah McCoy MA, CCC-SLP [phone] McCoy Leah Meryl 03/08/2017, 11:56 AM               ASSESSMENT / PLAN:  Recurrent pneumonia Recurrent cough Intermittent hemoptysis Left hemidiaphragm paralysis History of COPD History of sleep apnea History of gastroesophageal reflux disease   Recurrent pneumonia with associated cough and intermittent hemoptysis Review of clinic notes raising concern for ongoing aspiration; however, pt states he has been worked up multiple times and has always had normal studies.  Swallowing evaluation was completed 1/21 without evidence of aspiration.  Esophogram ordered 1/21 to rule out esophageal dysmotility.  This also returned as normal.  Of note, he does have a history of diaphragmatic paralysis on the left and chronic scarring.  Given the clinical course of recurrent exacerbation that improved some with  antibiotics, then worsened once again after completion this would also raise concern about resistant organism.  Less likely autoimmune in nature  Plan/rec  Continue current antibiotics and follow cultures (sputum added) Repeat CT scan in 4 - 6 weeks to evaluate for changes after abx therapy If continues to have problems, then can re-visit / consider bronch Continue PPI Continue pulm hygiene measures, have added mucomyst to facilitate mucociliary clearance  Consider sending autoimmune workup but address above first If respiratory issues persist and worsen, can consider diaphragmatic stimulator given hx of paralysis on left F/u on echo. Per pt, he has appt scheduled with [facility] pulmonology on 03/12/17.  Rutherford Guysahul Shandon Matson, [place] - [facility] Pager: [phone]  or [phone] 03/09/2017, 11:51 AM

## 2017-03-09 NOTE — Progress Notes (Signed)
  Echocardiogram 2D Echocardiogram with definity has been performed.  Leta JunglingCooper, Hines Kloss M 03/09/2017, 4:37 PM

## 2017-03-10 ENCOUNTER — Encounter (HOSPITAL_COMMUNITY): Payer: Self-pay | Admitting: Pulmonary Disease

## 2017-03-10 ENCOUNTER — Inpatient Hospital Stay (HOSPITAL_COMMUNITY): Payer: BLUE CROSS/BLUE SHIELD

## 2017-03-10 ENCOUNTER — Encounter (HOSPITAL_COMMUNITY)
Admission: EM | Disposition: A | Discharge status: Home / Self Care | Payer: Self-pay | Source: Home / Self Care | Attending: Internal Medicine

## 2017-03-10 DIAGNOSIS — I5032 Chronic diastolic (congestive) heart failure: Secondary | ICD-10-CM

## 2017-03-10 DIAGNOSIS — Z9889 Other specified postprocedural states: Secondary | ICD-10-CM

## 2017-03-10 HISTORY — PX: VIDEO BRONCHOSCOPY: SHX5072

## 2017-03-10 SURGERY — BRONCHOSCOPY, WITH FLUOROSCOPY
Anesthesia: Moderate Sedation | Laterality: Bilateral

## 2017-03-10 MED ORDER — SODIUM CHLORIDE 0.9 % IV SOLN
Freq: Once | INTRAVENOUS | Status: AC
  Administered 2017-03-10: 10 mL/h via INTRAVENOUS

## 2017-03-10 MED ORDER — BUTAMBEN-TETRACAINE-BENZOCAINE 2-2-14 % EX AERO: 1.0000 | INHALATION_SPRAY | Freq: Once | CUTANEOUS | Status: DC

## 2017-03-10 MED ORDER — FUROSEMIDE 20 MG PO TABS
20.0000 mg | ORAL_TABLET | Freq: Every day | ORAL | Status: DC
  Administered 2017-03-10 – 2017-03-11 (×2): 20 mg via ORAL
  Filled 2017-03-10: qty 1

## 2017-03-10 MED ORDER — BENZONATATE 100 MG PO CAPS
200.0000 mg | ORAL_CAPSULE | Freq: Three times a day (TID) | ORAL | Status: DC | PRN
  Administered 2017-03-10: 200 mg via ORAL
  Filled 2017-03-10: qty 2

## 2017-03-10 MED ORDER — PHENYLEPHRINE HCL 0.25 % NA SOLN: 1.0000 | Freq: Four times a day (QID) | NASAL | Status: DC | PRN

## 2017-03-10 MED ORDER — GUAIFENESIN-DM 100-10 MG/5ML PO SYRP
5.0000 mL | ORAL_SOLUTION | ORAL | Status: DC | PRN
  Administered 2017-03-10 – 2017-03-11 (×2): 5 mL via ORAL
  Filled 2017-03-10 (×3): qty 5

## 2017-03-10 MED ORDER — PHENYLEPHRINE HCL 0.25 % NA SOLN
NASAL | Status: DC | PRN
  Administered 2017-03-10: 2 via NASAL

## 2017-03-10 MED ORDER — FENTANYL CITRATE (PF) 100 MCG/2ML IJ SOLN
25.0000 ug | INTRAMUSCULAR | Status: AC | PRN
  Administered 2017-03-10 (×8): 25 ug via INTRAVENOUS

## 2017-03-10 MED ORDER — MIDAZOLAM HCL 5 MG/ML IJ SOLN
INTRAMUSCULAR | Status: DC | PRN
  Administered 2017-03-10 (×5): 1 mg via INTRAVENOUS

## 2017-03-10 MED ORDER — HYDROCOD POLST-CPM POLST ER 10-8 MG/5ML PO SUER
5.0000 mL | Freq: Two times a day (BID) | ORAL | Status: DC | PRN
  Administered 2017-03-10 – 2017-03-11 (×3): 5 mL via ORAL
  Filled 2017-03-10 (×3): qty 5

## 2017-03-10 MED ORDER — LIDOCAINE HCL 2 % EX GEL: 1.0000 "application " | Freq: Once | CUTANEOUS | Status: DC

## 2017-03-10 MED ORDER — FENTANYL CITRATE (PF) 100 MCG/2ML IJ SOLN
INTRAMUSCULAR | Status: AC
  Filled 2017-03-10: qty 4

## 2017-03-10 MED ORDER — LIDOCAINE HCL (PF) 1 % IJ SOLN
INTRAMUSCULAR | Status: DC | PRN
  Administered 2017-03-10: 6 mL

## 2017-03-10 MED ORDER — MIDAZOLAM HCL 5 MG/ML IJ SOLN
INTRAMUSCULAR | Status: AC
  Filled 2017-03-10: qty 2

## 2017-03-10 MED ORDER — MENTHOL 3 MG MT LOZG
1.0000 | LOZENGE | OROMUCOSAL | Status: DC | PRN
  Filled 2017-03-10: qty 9

## 2017-03-10 MED ORDER — LIDOCAINE HCL 2 % EX GEL
CUTANEOUS | Status: DC | PRN
  Administered 2017-03-10: 1

## 2017-03-10 NOTE — Progress Notes (Signed)
Name: [name] MRN: [id number] DOB: 1955-09-20    ADMISSION DATE:  03/07/2017 CONSULTATION DATE:  1/21  REFERRING MD :  vann  CHIEF COMPLAINT:  Recurrent PNA  BRIEF PATIENT DESCRIPTION:  62 year old remote smoker with recurrent pneumonia.  He also has a paralyzed left hemidiaphragm.  He quit smoking in 1995 and smoked less than 10 pack years.  He has bipolar disease.  I had seen him last in 2015 when he had chronic cough and turned out to have a foreign body which ultimately needed bronchoscopy by thoracic surgery under general anesthesia for removal, this showed vegetable matter.  He did not clearly describe an aspiration episode then. He has been seen by partner since then has been diagnosed with COPD. I reviewed all his prior imaging, to me it seems that he has bronchiectasis bilateral.  I note that AFB and fungus was negative on bronchoscopy in 2015.  SIGNIFICANT EVENTS   STUDIES:  CT chest 1/20 > No PE, bibasilar airspace disease, probable multifocal PNA, b/l infrahilar adenopathy. 1/21 Modified barium swallow > normal. Esophogram 1/22 > normal. Echo 1/22 >   SUBJECTIVE:  Episode of coughing Afebrile Denies CP  VITAL SIGNS: Temp:  [97.8 F (36.6 C)-98.5 F (36.9 C)] 98.1 F (36.7 C) (01/23 0747) Pulse Rate:  [73-89] 78 (01/23 0820) Resp:  [14-24] 17 (01/23 0820) BP: (129-162)/(71-102) 161/99 (01/23 0820) SpO2:  [91 %-98 %] 95 % (01/23 0820) Weight:  [275 lb (124.7 kg)] 275 lb (124.7 kg) (01/23 0747)  PHYSICAL EXAMINATION: General: Adult male, sitting in recliner, in NAD HEENT: Normocephalic atraumatic no jugular venous distention Cardiac: Regular rate and rhythm, no M/R/G Pulmonary: bibasal fine crackles Abdomen: Soft nontender no warm dry brisk cap refill, 1+ pedal edema Neuro/psych: Awake alert and oriented no focal deficits  Recent Labs  Lab 03/07/17 1829 03/08/17 0324  NA 136 137  K 4.1 4.3  CL 101 105  CO2 24 23  BUN 10 11  CREATININE 0.85 0.92    GLUCOSE 106* 108*   Recent Labs  Lab 03/07/17 1829 03/08/17 0324  HGB 12.0* 10.5*  HCT 36.3* 32.3*  WBC 13.6* 11.2*  PLT 232 222   Dg Esophagus  Result Date: 03/09/2017 CLINICAL DATA:  Coughing, recent aspiration pneumonia. Normal swallow study yesterday. EXAM: ESOPHOGRAM / BARIUM SWALLOW / BARIUM TABLET STUDY TECHNIQUE: Combined double contrast and single contrast examination performed using effervescent crystals, thick barium liquid, and thin barium liquid. The patient was observed with fluoroscopy swallowing a 13 mm barium sulphate tablet. FLUOROSCOPY TIME:  Fluoroscopy Time:  1 minute, 48 seconds. Radiation Exposure Index (if provided by the fluoroscopic device): 32.0 mGy. Number of Acquired Spot Images: 0 COMPARISON:  CT chest dated March 07, 2017. FINDINGS: A double contrast study was undertaken and the patient tolerated this well and without difficulty. Primary peristaltic waves in the esophagus were normal. No obstruction to the forward flow of contrast throughout the esophagus and into the stomach. Normal esophageal course and contour. Normal esophageal mucosal pattern. No esophageal stricture, ulceration, or other significant abnormality. A 13 mm barium tablet passed without difficulty into the stomach. IMPRESSION: Normal barium esophagram. Electronically Signed   By: Obie Dredge M.D.   On: 03/09/2017 10:48   Dg Swallowing Func-speech Pathology  Result Date: 03/08/2017 Objective Swallowing Evaluation: Type of Study: MBS-Modified Barium Swallow Study  Patient Details Name: [name] MRN: [id number] Date of Birth: 1956-01-03 Today's Date: 03/08/2017 Time: SLP Start Time (ACUTE ONLY): 1133 -SLP Stop Time (ACUTE  ONLY): 1148 SLP Time Calculation (min) (ACUTE ONLY): 15 min Past Medical History: Past Medical History: Diagnosis Date . ADD (attention deficit disorder)  . Anxiety   Panic . Bipolar disorder (HCC)  . GERD (gastroesophageal reflux disease)   ? they thought GERD caused cough.  .  Mental disorder   ADD, bipolar . Pneumonia 04/12/2013 . Shortness of breath  . Substance abuse [facility](HCC)  Past Surgical History: Past Surgical History: Procedure Laterality Date . APPENDECTOMY  1988 . COLONOSCOPY   . TONSILLECTOMY   . VIDEO BRONCHOSCOPY Bilateral 10/10/2013  Procedure: VIDEO BRONCHOSCOPY WITH FLUORO;  Surgeon: Oretha Milchakesh Alva V, MD;  Location: [facility];  Service: Cardiopulmonary;  Laterality: Bilateral; . VIDEO BRONCHOSCOPY N/A 10/13/2013  Procedure: VIDEO BRONCHOSCOPY, REMOVAL OF ENDOBRONCHIAL LESION, PROBABLE FOREIGN BODY;  Surgeon: Delight OvensEdward B Gerhardt, MD;  Location: [facility] OR;  Service: Thoracic;  Laterality: N/A; . VIDEO BRONCHOSCOPY N/A 11/28/2013  Procedure: VIDEO BRONCHOSCOPY;  Surgeon: Delight OvensEdward B Gerhardt, MD;  Location: [facility] OR;  Service: Thoracic;  Laterality: N/A;  bronchoscopy for removal of foreign body HPI: This is a 62 year old obese man with COPD, chronic cough, bipolar d/o, PNA, diaphragmatic paralysis, OSA on CPAP, major depressive disorder, hypertension, GERD who reports seeking medical attention for ongoing coughing spells associated with shortness of breath, frequent panic attacks. MD questioning aspiration in the setting of untreated GERD. MBS complete 10/02/13, normal oropharyngeal swallow. Bronchoscopy complete 09/2013 however noted foreign body aspiration in lower right lobe with f/o consultation and bronch complete by cardiothoracic surgeon removing and confirming foreign body as "vegetable material." Chest CT 03/07/17: Bibasilar mild airspace disease and mild consolidation in the posterior aspect of the lingula /LEFT upper lobe. Findings concerning for MULTIFOCAL PNEUMONIA. Aspiration pneumonitis or edema less favored.  No Data Recorded Assessment / Plan / Recommendation CHL IP CLINICAL IMPRESSIONS 03/08/2017 Clinical Impression Patient presents with a normal oropharyngeal swallow with full airway protection and pharyngeal clearance. Question of a small CP bar noted. This did not impact  clearance of bolus or results in any episodes of backflow during today's study even when challenged with multiple large consecutive boluses, however cannot r/o impact the larger quanitites of po over the course of a meal or with improper positioning. Education complete regarding general safe swallowing precautions. No further SLP needs indicated. Defer further eosphageal w/u or intervention to MD if feel necessary based on additional w/u for recurrent PNA. At the least, question if PPI would be beneficial.  SLP Visit Diagnosis Dysphagia, unspecified (R13.10) Attention and concentration deficit following -- Frontal lobe and executive function deficit following -- Impact on safety and function Mild aspiration risk   CHL IP TREATMENT RECOMMENDATION 03/08/2017 Treatment Recommendations No treatment recommended at this time   Prognosis 10/02/2013 Prognosis for Safe Diet Advancement Good Barriers to Reach Goals -- Barriers/Prognosis Comment -- CHL IP DIET RECOMMENDATION 03/08/2017 SLP Diet Recommendations Regular solids;Thin liquid Liquid Administration via Cup;Straw Medication Administration Whole meds with liquid Compensations Slow rate;Small sips/bites;Follow solids with liquid Postural Changes Seated upright at 90 degrees;Remain semi-upright after after feeds/meals (Comment)   CHL IP OTHER RECOMMENDATIONS 03/08/2017 Recommended Consults Consider esophageal assessment Oral Care Recommendations Oral care BID Other Recommendations --   CHL IP FOLLOW UP RECOMMENDATIONS 03/08/2017 Follow up Recommendations None   No flowsheet data found.     CHL IP ORAL PHASE 03/08/2017 Oral Phase WFL Oral - Pudding Teaspoon -- Oral - Pudding Cup -- Oral - Honey Teaspoon -- Oral - Honey Cup -- Oral - Nectar Teaspoon -- Oral - Nectar Cup --  Oral - Nectar Straw -- Oral - Thin Teaspoon -- Oral - Thin Cup -- Oral - Thin Straw -- Oral - Puree -- Oral - Mech Soft -- Oral - Regular -- Oral - Multi-Consistency -- Oral - Pill -- Oral Phase - Comment --   CHL IP PHARYNGEAL PHASE 03/08/2017 Pharyngeal Phase WFL Pharyngeal- Pudding Teaspoon -- Pharyngeal -- Pharyngeal- Pudding Cup -- Pharyngeal -- Pharyngeal- Honey Teaspoon -- Pharyngeal -- Pharyngeal- Honey Cup -- Pharyngeal -- Pharyngeal- Nectar Teaspoon -- Pharyngeal -- Pharyngeal- Nectar Cup -- Pharyngeal -- Pharyngeal- Nectar Straw -- Pharyngeal -- Pharyngeal- Thin Teaspoon -- Pharyngeal -- Pharyngeal- Thin Cup -- Pharyngeal -- Pharyngeal- Thin Straw -- Pharyngeal -- Pharyngeal- Puree -- Pharyngeal -- Pharyngeal- Mechanical Soft -- Pharyngeal -- Pharyngeal- Regular -- Pharyngeal -- Pharyngeal- Multi-consistency -- Pharyngeal -- Pharyngeal- Pill -- Pharyngeal -- Pharyngeal Comment --  CHL IP CERVICAL ESOPHAGEAL PHASE 03/08/2017 Cervical Esophageal Phase WFL Pudding Teaspoon -- Pudding Cup -- Honey Teaspoon -- Honey Cup -- Nectar Teaspoon -- Nectar Cup -- Nectar Straw -- Thin Teaspoon -- Thin Cup -- Thin Straw -- Puree -- Mechanical Soft -- Regular -- Multi-consistency -- Pill -- Cervical Esophageal Comment -- CHL IP GO 10/02/2013 Functional Assessment Tool Used ASHA NOMS, clinical judgment Functional Limitations Swallowing Swallow Current Status (U1324) CH Swallow Goal Status (M0102) [facility] Swallow Discharge Status (V2536) CH Motor Speech Current Status (U4403) (None) Motor Speech Goal Status (K7425) (None) Motor Speech Goal Status (Z5638) (None) Spoken Language Comprehension Current Status (V5643) (None) Spoken Language Comprehension Goal Status (P2951) (None) Spoken Language Comprehension Discharge Status [phone] (None) Spoken Language Expression Current Status (S0630) (None) Spoken Language Expression Goal Status (Z6010) (None) Spoken Language Expression Discharge Status [phone]) (None) Attention Current Status (T7322) (None) Attention Goal Status (G2542) (None) Attention Discharge Status (H0623) (None) Memory Current Status (J6283) (None) Memory Goal Status (T5176) (None) Memory Discharge Status (H6073) (None) Voice  Current Status (X1062) (None) Voice Goal Status (I9485) (None) Voice Discharge Status (I6270) (None) Other Speech-Language Pathology Functional Limitation Current Status (J5009) (None) Other Speech-Language Pathology Functional Limitation Goal Status (F8182) (None) Other Speech-Language Pathology Functional Limitation Discharge Status [phone]) (None) Ferdinand Lango MA, CCC-SLP [phone] McCoy Leah Meryl 03/08/2017, 11:56 AM               ASSESSMENT / PLAN:  Recurrent pneumonia Recurrent cough Intermittent hemoptysis Left hemidiaphragm paralysis History of COPD History of sleep apnea History of gastroesophageal reflux disease   Recurrent pneumonia with associated cough and intermittent hemoptysis BL bronchiectasis Review of clinic notes raising concern for ongoing aspiration; however, pt states he has been worked up multiple times and has always had normal studies.  Swallowing evaluation was completed 1/21 without evidence of aspiration.  Esophogram  normal.  Of note, he does have a history of diaphragmatic paralysis on the left and g.  Given the clinical course of recurrent exacerbation that improved some with antibiotics, then worsened once again after completion this would also raise concern about resistant organism.  Less likely eosinophilic pneumonia or BOOP  Plan/rec  Bscopy today - afb/ fungal cx sent  Continue current antibiotics and follow cultures Repeat HRCT scan in 4 - 6 weeks as outpatient Consider sending  Ig panel to r/o deficiency Per pt, he has appt scheduled with [facility] pulmonology on 03/12/17. Can dc tomorrow if feeling better so that he can keep appt   Cyril Mourning MD. FCCP. [facility] Pager [phone] If no response call [phone]     03/10/2017, 9:23 AM

## 2017-03-10 NOTE — Progress Notes (Signed)
PROGRESS NOTE    [name]  ZOX:[id number] DOB: 1955/12/13 DOA: 03/07/2017 PCP: Shelva Majestic, MD   Outpatient Specialists:     Brief Narrative:  This is a 62 year old obese man with COPD, chronic cough, diaphragmatic paralysis, OSA on CPAP, major depressive disorder, hypertension, GERD who reports seeking medical attention for ongoing coughing spells associated with shortness of breath.  History obtained via review of the EMR as well as discussion with the emergency medicine team, in conjunction with history from the patient and his family. They report that since November 2018 his primary symptoms have included coughing spells and worsening shortness of breath. He is been treated with fluoroquinolone therapy as well as cough suppressants and multiple treatments of prednisone. Over the past week he reports ongoing weakness and shortness of breath, frequently will have panic attacks. He reports a cough productive of cloudy sputum, at times associated with some blood. He reports amount of blood-tinged sputum was less than a tablespoon. He reports some subjective fever and chills. He reports pleuritic right-sided chest pain.     Assessment & Plan:   Active Problems:   Pneumonia   Pneumonia, bacterial likely He has failed outpatient management (prednisone, po antimicrobial therapy, cough suppressants) now with  CT evidence of multifocal pneumonia. -normal esophogram/SLP eval -appreciate PCCM eval: s/p bronch-- they suspect chronic cough and congestion is related to bronchiectasis  -afb/ fungal cx sent during bronch Continue current antibiotics and follow cultures Repeat HRCT scan in 4 - 6 weeks as outpatient   Chronic diastolic CHF -swelling started after HCTZ stopped -start lasix -outpatient cards follow up  COPD:  -defer to pulm  GERD: protonix  OSA: CPAP qhs  Depression: continue home SSRI therapy, gabapentin  Hx of anemia: continue home po iron  supplement     DVT prophylaxis:  SCD's  Code Status: Full Code   Family Communication: Called wife, no answer  Disposition Plan:     Consultants:      Subjective: Asking for hycodan  Objective: Vitals:   03/10/17 0925 03/10/17 0930 03/10/17 0935 03/10/17 1135  BP:  114/71 138/88   Pulse: 83 86 88   Resp: (!) 22 (!) 23 16   Temp:      TempSrc:      SpO2: 92% 90% 90% 93%  Weight:      Height:        Intake/Output Summary (Last 24 hours) at 03/10/2017 1421 Last data filed at 03/10/2017 0526 Gross per 24 hour  Intake 540 ml  Output -  Net 540 ml   Filed Weights   03/08/17 0417 03/10/17 0747  Weight: 125 kg (275 lb 9.2 oz) 124.7 kg (275 lb)    Examination:  General exam: in chair-- O2 off Respiratory system: lungs cleared with cough Cardiovascular system: rrr Gastrointestinal system: +BS, soft Extremities: min LE edema     Data Reviewed: I have personally reviewed following labs and imaging studies  CBC: Recent Labs  Lab 03/07/17 1829 03/08/17 0324  WBC 13.6* 11.2*  HGB 12.0* 10.5*  HCT 36.3* 32.3*  MCV 83.8 83.2  PLT 232 222   Basic Metabolic Panel: Recent Labs  Lab 03/07/17 1829 03/08/17 0324  NA 136 137  K 4.1 4.3  CL 101 105  CO2 24 23  GLUCOSE 106* 108*  BUN 10 11  CREATININE 0.85 0.92  CALCIUM 8.9 8.4*   GFR: Estimated Creatinine Clearance: 108.9 mL/min (by C-G formula based on SCr of 0.92 mg/dL). Liver Function Tests: No  results for input(s): AST, ALT, ALKPHOS, BILITOT, PROT, ALBUMIN in the last 168 hours. No results for input(s): LIPASE, AMYLASE in the last 168 hours. No results for input(s): AMMONIA in the last 168 hours. Coagulation Profile: No results for input(s): INR, PROTIME in the last 168 hours. Cardiac Enzymes: No results for input(s): CKTOTAL, CKMB, CKMBINDEX, TROPONINI in the last 168 hours. BNP (last 3 results) No results for input(s): PROBNP in the last 8760 hours. HbA1C: No results for input(s):  HGBA1C in the last 72 hours. CBG: No results for input(s): GLUCAP in the last 168 hours. Lipid Profile: No results for input(s): CHOL, HDL, LDLCALC, TRIG, CHOLHDL, LDLDIRECT in the last 72 hours. Thyroid Function Tests: No results for input(s): TSH, T4TOTAL, FREET4, T3FREE, THYROIDAB in the last 72 hours. Anemia Panel: No results for input(s): VITAMINB12, FOLATE, FERRITIN, TIBC, IRON, RETICCTPCT in the last 72 hours. Urine analysis:    Component Value Date/Time   COLORURINE YELLOW 11/05/2016 1701   APPEARANCEUR CLEAR 11/05/2016 1701   LABSPEC 1.015 11/05/2016 1701   PHURINE 7.5 11/05/2016 1701   GLUCOSEU NEGATIVE 11/05/2016 1701   HGBUR NEGATIVE 11/05/2016 1701   BILIRUBINUR NEGATIVE 11/05/2016 1701   BILIRUBINUR n 07/09/2016 1639   KETONESUR NEGATIVE 11/05/2016 1701   PROTEINUR n 07/09/2016 1639   PROTEINUR NEGATIVE 07/28/2011 0806   UROBILINOGEN 1.0 11/05/2016 1701   NITRITE NEGATIVE 11/05/2016 1701   LEUKOCYTESUR NEGATIVE 11/05/2016 1701     Recent Results (from the past 240 hour(s))  Culture, blood (Routine X 2) w Reflex to ID Panel     Status: None (Preliminary result)   Collection Time: 03/07/17  7:15 PM  Result Value Ref Range Status   Specimen Description BLOOD RIGHT HAND  Final   Special Requests   Final    BOTTLES DRAWN AEROBIC AND ANAEROBIC Blood Culture adequate volume   Culture NO GROWTH 2 DAYS  Final   Report Status PENDING  Incomplete  Respiratory Panel by PCR     Status: None   Collection Time: 03/08/17  4:51 AM  Result Value Ref Range Status   Adenovirus NOT DETECTED NOT DETECTED Final   Coronavirus 229E NOT DETECTED NOT DETECTED Final   Coronavirus HKU1 NOT DETECTED NOT DETECTED Final   Coronavirus NL63 NOT DETECTED NOT DETECTED Final   Coronavirus OC43 NOT DETECTED NOT DETECTED Final   Metapneumovirus NOT DETECTED NOT DETECTED Final   Rhinovirus / Enterovirus NOT DETECTED NOT DETECTED Final   Influenza A NOT DETECTED NOT DETECTED Final   Influenza B  NOT DETECTED NOT DETECTED Final   Parainfluenza Virus 1 NOT DETECTED NOT DETECTED Final   Parainfluenza Virus 2 NOT DETECTED NOT DETECTED Final   Parainfluenza Virus 3 NOT DETECTED NOT DETECTED Final   Parainfluenza Virus 4 NOT DETECTED NOT DETECTED Final   Respiratory Syncytial Virus NOT DETECTED NOT DETECTED Final   Bordetella pertussis NOT DETECTED NOT DETECTED Final   Chlamydophila pneumoniae NOT DETECTED NOT DETECTED Final   Mycoplasma pneumoniae NOT DETECTED NOT DETECTED Final      Anti-infectives (From admission, onward)   Start     Dose/Rate Route Frequency Ordered Stop   03/08/17 1100  vancomycin (VANCOCIN) IVPB 1000 mg/200 mL premix     1,000 mg 200 mL/hr over 60 Minutes Intravenous Every 12 hours 03/07/17 2206     03/08/17 0600  ceFEPIme (MAXIPIME) 1 g in dextrose 5 % 50 mL IVPB     1 g 100 mL/hr over 30 Minutes Intravenous Every 8 hours 03/08/17 0124  03/07/17 2230  vancomycin (VANCOCIN) 2,500 mg in sodium chloride 0.9 % 500 mL IVPB     2,500 mg 250 mL/hr over 120 Minutes Intravenous  Once 03/07/17 2206 03/08/17 0250   03/07/17 2200  ceFEPIme (MAXIPIME) 2 g in dextrose 5 % 50 mL IVPB     2 g 100 mL/hr over 30 Minutes Intravenous  Once 03/07/17 2157 03/08/17 0013       Radiology Studies: Dg Esophagus  Result Date: 03/09/2017 CLINICAL DATA:  Coughing, recent aspiration pneumonia. Normal swallow study yesterday. EXAM: ESOPHOGRAM / BARIUM SWALLOW / BARIUM TABLET STUDY TECHNIQUE: Combined double contrast and single contrast examination performed using effervescent crystals, thick barium liquid, and thin barium liquid. The patient was observed with fluoroscopy swallowing a 13 mm barium sulphate tablet. FLUOROSCOPY TIME:  Fluoroscopy Time:  1 minute, 48 seconds. Radiation Exposure Index (if provided by the fluoroscopic device): 32.0 mGy. Number of Acquired Spot Images: 0 COMPARISON:  CT chest dated March 07, 2017. FINDINGS: A double contrast study was undertaken and the  patient tolerated this well and without difficulty. Primary peristaltic waves in the esophagus were normal. No obstruction to the forward flow of contrast throughout the esophagus and into the stomach. Normal esophageal course and contour. Normal esophageal mucosal pattern. No esophageal stricture, ulceration, or other significant abnormality. A 13 mm barium tablet passed without difficulty into the stomach. IMPRESSION: Normal barium esophagram. Electronically Signed   By: Obie DredgeWilliam T Derry M.D.   On: 03/09/2017 10:48   Dg Chest Port 1 View  Result Date: 03/10/2017 CLINICAL DATA:  Post RIGHT lower lobe bronchoscopy EXAM: PORTABLE CHEST 1 VIEW COMPARISON:  CT 03/07/2017 FINDINGS: Normal cardiac silhouette. LEFT upper lobe and lower lobe airspace disease again demonstrated RIGHT lung clear. No pneumothorax. No osseous abnormality IMPRESSION: Persistent mild LEFT upper lobe LEFT lower lobe pneumonia. No pneumothorax Electronically Signed   By: Genevive BiStewart  Edmunds M.D.   On: 03/10/2017 09:37   Dg C-arm Bronchoscopy  Result Date: 03/10/2017 C-ARM BRONCHOSCOPY: Fluoroscopy was utilized by the requesting physician.  No radiographic interpretation.        Scheduled Meds: . albuterol  5 mg Nebulization Once  . ferrous sulfate  325 mg Oral QHS  . FLUoxetine  60 mg Oral QHS  . furosemide  20 mg Oral Daily  . gabapentin  800 mg Oral TID  . ipratropium-albuterol  3 mL Nebulization QID  . losartan  100 mg Oral Daily  . pantoprazole  40 mg Oral BID  . traZODone  50 mg Oral QHS   Continuous Infusions: . ceFEPime (MAXIPIME) IV Stopped (03/10/17 1315)  . vancomycin Stopped (03/10/17 1216)     LOS: 2 days    Time spent: 35 min    Joseph ArtJessica U Yardley Lekas, DO [facility] Pager [phone]  If 7PM-7AM, please contact night-coverage [email or web address] Password [email or web address] 03/10/2017, 2:21 PM

## 2017-03-10 NOTE — Progress Notes (Signed)
RT NOTE:  Pt has home CPAP in room that he manages. RT available if needed.

## 2017-03-10 NOTE — Progress Notes (Signed)
Pharmacy Antibiotic Note  [name] is a 62 y.o. male admitted on 03/07/2017 with recurrent pneumonia.  Patient continues on Vancomycin and Cefepime day#4.  Pt is s/p bronch today.  Noted plans for possible discharge 1/24 so patient can keep outpatient appointment with [facility] on 1/25.  Will defer Vancomycin trough at this time.   Plan: Continue Vancomycin 1g IV q12h per obesity nomogram Continue Cefepime 1gm IV q8h Follow c/s, clinical progression, renal function, level PRN  Height: _0  (177.8 cm) Weight: 275 lb (124.7 kg) IBW/kg (Calculated) : 73  Temp (24hrs), Avg:98.1 F (36.7 C), Min:97.8 F (36.6 C), Max:98.5 F (36.9 C)  Recent Labs  Lab 03/07/17 1829 03/08/17 0324  WBC 13.6* 11.2*  CREATININE 0.85 0.92    Estimated Creatinine Clearance: 108.9 mL/min (by C-G formula based on SCr of 0.92 mg/dL).    Allergies  Allergen Reactions  . Omeprazole Diarrhea and Nausea And Vomiting    Antimicrobials this admission: Vancomycin 1/20 >>  Cefepime 1/20 >>   Dose adjustments this admission: n/a  Microbiology results: 1/20 Blood > ngtd 1/21 resp panel negative 1/23 BAL  Thank you for allowing pharmacy to be a part of this patient's care.  [employer], Pharm.D., BCPS Clinical Pharmacist Pager: [phone] Clinical phone for 03/10/2017 from 8:30-4:00 is [phone]. After 4pm, please call Main Rx [phone]) for assistance. 03/10/2017 12:28 PM

## 2017-03-10 NOTE — Procedures (Signed)
Bronchoscopy Procedure Note [name] [id number] 17-Sep-1955  Procedure: Bronchoscopy Indications: Obtain specimens for culture and/or other diagnostic studies  Procedure Details Consent: Risks of procedure as well as the alternatives and risks of each were explained to the (patient/caregiver).  Consent for procedure obtained. Time Out: Verified patient identification, verified procedure, site/side was marked, verified correct patient position, special equipment/implants available, medications/allergies/relevent history reviewed, required imaging and test results available.  Performed  In preparation for procedure, topical lidocaine to nares & neb. Sedation: versed 41m & fentanyl 200 mcg in divided doses over 281ms  Airway entered and the following bronchi were examined: Bronchi.  No endobronchial lesions. Thin mucoid secretions suctioned from left airways. TBBx from [facility] Procedures performed: BAL, Transbronchial biopsy  performed Bronchoscope removed.    Evaluation Hemodynamic Status: BP stable throughout; O2 sats: stable throughout. ETCO2 rose to 50 but came down within a minute Patient's Current Condition: stable Specimens:  Sent purulent fluid Complications: No apparent complications Patient did tolerate procedure well.   RaLeanna SatolElsworth Soho/23/2019

## 2017-03-10 NOTE — Progress Notes (Addendum)
MD Benjamine MolaVann notified: pt complaining of cough. PRN is for bedtime. Please order additional dose.  MD Benjamine MolaVann ordered Tessalon pearls and Tussionex 5 mL q 12. Resolved cough.

## 2017-03-10 NOTE — Progress Notes (Signed)
Video bronchoscopy performed.  Intervention bronchial washing. Intervention bronchial biopsy.  No complications noted.  Will continue to monitor. 

## 2017-03-10 NOTE — Plan of Care (Signed)
Pt is a/o x4. Sr, vss. Ra. Bilateral breath sounds diminished. Complaints of cough after bronchoscopy today. PRN meds given x2. Bowel sounds present. Voiding. Falls precautions maintained. Scds for vte. Able to turn self. Independently ambulatory. Md to call wife to answer questions. Will continue to montior.

## 2017-03-11 ENCOUNTER — Telehealth: Payer: Self-pay | Admitting: Emergency Medicine

## 2017-03-11 DIAGNOSIS — G4733 Obstructive sleep apnea (adult) (pediatric): Secondary | ICD-10-CM

## 2017-03-11 LAB — CBC
HEMATOCRIT: 32.6 % — AB (ref 39.0–52.0)
Hemoglobin: 10.5 g/dL — ABNORMAL LOW (ref 13.0–17.0)
MCH: 27 pg (ref 26.0–34.0)
MCHC: 32.2 g/dL (ref 30.0–36.0)
MCV: 83.8 fL (ref 78.0–100.0)
Platelets: 250 10*3/uL (ref 150–400)
RBC: 3.89 MIL/uL — AB (ref 4.22–5.81)
RDW: 13.8 % (ref 11.5–15.5)
WBC: 9.8 10*3/uL (ref 4.0–10.5)

## 2017-03-11 LAB — ACID FAST SMEAR (AFB, MYCOBACTERIA): Acid Fast Smear: NEGATIVE

## 2017-03-11 LAB — BASIC METABOLIC PANEL
ANION GAP: 9 (ref 5–15)
BUN: 9 mg/dL (ref 6–20)
CHLORIDE: 99 mmol/L — AB (ref 101–111)
CO2: 24 mmol/L (ref 22–32)
Calcium: 8.5 mg/dL — ABNORMAL LOW (ref 8.9–10.3)
Creatinine, Ser: 0.82 mg/dL (ref 0.61–1.24)
GFR calc Af Amer: >60 mL/min (ref >60)
GFR calc non Af Amer: >60 mL/min (ref >60)
Glucose, Bld: 98 mg/dL (ref 65–99)
POTASSIUM: 4.1 mmol/L (ref 3.5–5.1)
Sodium: 132 mmol/L — ABNORMAL LOW (ref 135–145)

## 2017-03-11 LAB — ACID FAST SMEAR (AFB)

## 2017-03-11 MED ORDER — PANTOPRAZOLE SODIUM 40 MG PO TBEC: 40.0000 mg | DELAYED_RELEASE_TABLET | Freq: Two times a day (BID) | ORAL | 0 refills | Status: DC

## 2017-03-11 MED ORDER — AMOXICILLIN-POT CLAVULANATE 875-125 MG PO TABS: 1.0000 | ORAL_TABLET | Freq: Two times a day (BID) | ORAL | 0 refills | Status: DC

## 2017-03-11 MED ORDER — FUROSEMIDE 20 MG PO TABS: 20.0000 mg | ORAL_TABLET | Freq: Every day | ORAL | 0 refills | Status: DC

## 2017-03-11 MED ORDER — AMOXICILLIN-POT CLAVULANATE 875-125 MG PO TABS
1.0000 | ORAL_TABLET | Freq: Two times a day (BID) | ORAL | Status: DC
  Administered 2017-03-11: 1 via ORAL
  Filled 2017-03-11: qty 1

## 2017-03-11 MED ORDER — HYDROCODONE-HOMATROPINE 5-1.5 MG/5ML PO SYRP: 5.0000 mL | ORAL_SOLUTION | Freq: Three times a day (TID) | ORAL | 0 refills | Status: DC | PRN

## 2017-03-11 NOTE — Telephone Encounter (Signed)
Spoke with patient. He is currently at the pharmacy to get his Hycodan filled. The pharmacy will not fill it due to it being too early to fill.   Spoke with the pharmacist. She stated that the patient received a RX for Hycodan on 02/28/17 for 240mL, 5ml to take every 8 hours as needed for cough. He went to the hospital on 1/20 and was discharged today 1/24. Per their records, He should still have some medication left from the original RX. They will not fill the medication for another 5 days.   Looked in patient's chart, he has also received refills on the same medication on 1/07 (for 140mL) and 1/10 (for 240mL).   Will route to RB so he is aware.

## 2017-03-11 NOTE — Progress Notes (Signed)
Pt given discharge instructions, prescriptions, and care notes. Pt verbalized understanding AEB no further questions or concerns at this time. IV was discontinued, no redness, pain, or swelling noted at this time.Pt wife here to pick him up.  Following up with [facility] tomorrow. Pt left the floor via wheelchair with staff in stable condition.

## 2017-03-11 NOTE — Telephone Encounter (Signed)
Thank you. Agree with the pharmacy, cannot fill it early

## 2017-03-11 NOTE — Progress Notes (Signed)
SATURATION QUALIFICATIONS: (This note is used to comply with regulatory documentation for home oxygen)  Patient Saturations on Room Air at Rest = 94%  Patient Saturations on Room Air while Ambulating = 90%   Please briefly explain why patient needs home oxygen: N/A

## 2017-03-11 NOTE — Progress Notes (Signed)
Name: [name] MRN: [id number] DOB: Nov 12, 1955    ADMISSION DATE:  03/07/2017 CONSULTATION DATE:  1/21  REFERRING MD :  vann  CHIEF COMPLAINT:  Recurrent PNA  BRIEF PATIENT DESCRIPTION:  62 year old remote smoker with recurrent pneumonia.  He also has a paralyzed left hemidiaphragm.  He quit smoking in 1995 and smoked less than 10 pack years.  He has bipolar disease.  I had seen him last in 2015 when he had chronic cough and turned out to have a foreign body which ultimately needed bronchoscopy by thoracic surgery under general anesthesia for removal, this showed vegetable matter.  He did not clearly describe an aspiration episode then. He has been seen by partner since then has been diagnosed with COPD. I reviewed all his prior imaging, to me it seems that he has bronchiectasis bilateral.  I note that AFB and fungus was negative on bronchoscopy in 2015.  SIGNIFICANT EVENTS   STUDIES:  CT chest 1/20 > No PE, bibasilar airspace disease, probable multifocal PNA, b/l infrahilar adenopathy. 1/21 Modified barium swallow > normal. Esophogram 1/22 > normal. Echo 1/22 >   SUBJECTIVE:  Episode of coughing Afebrile Denies CP  VITAL SIGNS: Temp:  [98.1 F (36.7 C)-99.1 F (37.3 C)] 98.1 F (36.7 C) (01/24 0530) Pulse Rate:  [62-84] 62 (01/24 0530) Resp:  [18-19] 18 (01/24 0530) BP: (135-148)/(81-92) 145/81 (01/24 0530) SpO2:  [93 %-98 %] 98 % (01/24 1128)  PHYSICAL EXAMINATION: General: Adult male, sitting in recliner, in NAD HEENT: Normocephalic atraumatic no jugular venous distention Cardiac: Regular rate and rhythm, no M/R/G Pulmonary: bibasal fine crackles Abdomen: Soft nontender no warm dry brisk cap refill, 1+ pedal edema Neuro/psych: Awake alert and oriented no focal deficits  Recent Labs  Lab 03/07/17 1829 03/08/17 0324 03/11/17 0500  NA 136 137 132*  K 4.1 4.3 4.1  CL 101 105 99*  CO2 _0 BUN _1 CREATININE 0.85 0.92 0.82  GLUCOSE 106* 108* 98     Recent Labs  Lab 03/07/17 1829 03/08/17 0324 03/11/17 0500  HGB 12.0* 10.5* 10.5*  HCT 36.3* 32.3* 32.6*  WBC 13.6* 11.2* 9.8  PLT 232 222 250   Dg Chest Port 1 View  Result Date: 03/10/2017 CLINICAL DATA:  Post RIGHT lower lobe bronchoscopy EXAM: PORTABLE CHEST 1 VIEW COMPARISON:  CT 03/07/2017 FINDINGS: Normal cardiac silhouette. LEFT upper lobe and lower lobe airspace disease again demonstrated RIGHT lung clear. No pneumothorax. No osseous abnormality IMPRESSION: Persistent mild LEFT upper lobe LEFT lower lobe pneumonia. No pneumothorax Electronically Signed   By: Suzy Bouchard M.D.   On: 03/10/2017 09:37   Dg C-arm Bronchoscopy  Result Date: 03/10/2017 C-ARM BRONCHOSCOPY: Fluoroscopy was utilized by the requesting physician.  No radiographic interpretation.    ASSESSMENT / PLAN:  Recurrent pneumonia Recurrent cough Intermittent hemoptysis Left hemidiaphragm paralysis History of COPD History of sleep apnea History of gastroesophageal reflux disease   Recurrent pneumonia with associated cough and intermittent hemoptysis BL bronchiectasis Review of clinic notes raising concern for ongoing aspiration; however, pt states he has been worked up multiple times and has always had normal studies.  Swallowing evaluation was completed 1/21 without evidence of aspiration.  Esophogram  normal.  Of note, he does have a history of diaphragmatic paralysis on the left .    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   Kara Mead MD. Shade Flood. [facility] Pager [phone] If no response call [phone]     03/11/2017, 12:34 PM

## 2017-03-11 NOTE — Discharge Summary (Signed)
Physician Discharge Summary  GAELEN BRAGER WYS:[id number] DOB: Dec 11, 1955 DOA: 03/07/2017  PCP: Marin Olp, MD  Admit date: 03/07/2017 Discharge date: 03/11/2017   Recommendations for Outpatient Follow-Up:   1. Added lasix for LE edema and diastolic CHF, follow BMP- adjust as needed- may just need PRN 2. Has appointment with [facility] tomm   Discharge Diagnosis:   Active Problems:   Chronic cough   GERD (gastroesophageal reflux disease)   OSA (obstructive sleep apnea)   Pneumonia   Discharge disposition:  Home:  Discharge Condition: Improved.  Diet recommendation: Low sodium, heart healthy  Wound care: None.   History of Present Illness:   This is a 62 year old obese man with COPD, chronic cough, diaphragmatic paralysis, OSA on CPAP, major depressive disorder, hypertension, GERD who reports seeking medical attention for ongoing coughing spells associated with shortness of breath.  Has bronchs in 2015 that showed food particles.  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   They report that since November 2018 his primary symptoms have included coughing spells and worsening shortness of breath. He is been treated with fluoroquinolone therapy and then doxycycline as well as cough suppressants and multiple treatments of prednisone. Over the past week he reports ongoing weakness and shortness of breath, frequently will have panic attacks. He reports a cough productive of cloudy sputum, at times associated with some blood. He reports amount of blood-tinged sputum was less than a tablespoon. He reports some subjective fever and chills. He reports pleuritic right-sided chest pain.       Hospital Course by Problem:   Pneumonia, bacterial likely He has failed outpatient management (prednisone, po antimicrobial therapy, cough suppressants) now with CT evidence of multifocal pneumonia. -normal esophogram/SLP eval -appreciate PCCM eval: s/p bronch-- they  suspect chronic cough and congestion is related to bronchiectasis  -afb/ fungal cx sentduring bronch RepeatHRCT scan in 4 - 6 weeksas outpatient -will d/c on augmentin as has been on levaquin and doxy recently   Chronic diastolic CHF -swelling started after HCTZ stopped -start lasix-- monitor renal function closely -outpatient cards follow up  COPD:  -defer to pulm  GERD: protonix  OSA: CPAP qhs  Depression: continue home SSRI therapy, gabapentin  Hx of anemia: continue home po iron supplement      Medical Consultants:    pulm   Discharge Exam:   Vitals:   03/11/17 1128 03/11/17 1458  BP:  (!) 156/90  Pulse:  81  Resp:  18  Temp:  98.7 F (37.1 C)  SpO2: 98% 96%   Vitals:   03/11/17 0530 03/11/17 0854 03/11/17 1128 03/11/17 1458  BP: (!) 145/81   (!) 156/90  Pulse: 62   81  Resp: 18   18  Temp: 98.1 F (36.7 C)   98.7 F (37.1 C)  TempSrc: Oral   Oral  SpO2: 95% 98% 98% 96%  Weight:      Height:        Gen:  NAD- coughing is less    The results of significant diagnostics from this hospitalization (including imaging, microbiology, ancillary and laboratory) are listed below for reference.     Procedures and Diagnostic Studies:   Ct Angio Chest Pe W And/or Wo Contrast  Result Date: 03/07/2017 CLINICAL DATA:  Short of breath.  COPD.  Insert treatment EXAM: CT ANGIOGRAPHY CHEST WITH CONTRAST TECHNIQUE: Multidetector CT imaging of the chest was performed using the standard protocol during bolus administration of intravenous contrast. Multiplanar CT image reconstructions  and MIPs were obtained to evaluate the vascular anatomy. CONTRAST:  162m ISOVUE-370 IOPAMIDOL (ISOVUE-370) INJECTION 76% COMPARISON:  CT 03/02/2017 FINDINGS: Cardiovascular: No filling defects in the pulmonary arteries to suggest acute pulmonary embolism. No acute findings aorta great vessels. No pericardial effusion Mediastinum/Nodes: No axillary supraclavicular adenopathy.  There is bilateral infrahilar adenopathy. For example 13 mm RIGHT infrahilar node on image 168, series 6. 13 mm LEFT infrahilar node on image 134, series 6. Lungs/Pleura: There is bibasilar mild airspace disease as well as mild airspace disease in the posterior aspect of the LEFT upper lobe with air bronchograms. Upper lungs are clear. Upper Abdomen: Limited view of the liver, kidneys, pancreas are unremarkable. Normal adrenal glands. Musculoskeletal: No aggressive osseous lesion Review of the MIP images confirms the above findings. IMPRESSION: 1. No evidence acute pulmonary embolism. 2. Bibasilar mild airspace disease and mild consolidation in the posterior aspect of the lingula /LEFT upper lobe. Findings concerning for MULTIFOCAL PNEUMONIA. Aspiration pneumonitis or edema less favored. 3. Bilateral infrahilar adenopathy likely reactive and related to the airspace disease above. Electronically Signed   By: SSuzy BouchardM.D.   On: 03/07/2017 20:51   Dg Swallowing Func-speech Pathology  Result Date: 03/08/2017 Objective Swallowing Evaluation: Type of Study: MBS-Modified Barium Swallow Study  Patient Details Name: JKIMMY PARISHMRN: [id number] of Birth: 509/07/1957Today's Date: 03/08/2017 Time: SLP Start Time (ACUTE ONLY): 1133 -SLP Stop Time (ACUTE ONLY): 1148 SLP Time Calculation (min) (ACUTE ONLY): 15 min Past Medical History: Past Medical History: Diagnosis Date . ADD (attention deficit disorder)  . Anxiety   Panic . Bipolar disorder ([place]  . GERD (gastroesophageal reflux disease)   ? they thought GERD caused cough.  . Mental disorder   ADD, bipolar . Pneumonia 04/12/2013 . Shortness of breath  . Substance abuse ([facility]  Past Surgical History: Past Surgical History: Procedure Laterality Date . APPENDECTOMY  1988 . COLONOSCOPY   . TONSILLECTOMY   . VIDEO BRONCHOSCOPY Bilateral 10/10/2013  Procedure: VIDEO BRONCHOSCOPY WITH FLUORO;  Surgeon: RRigoberto Noel MD;  [place]  Service: Cardiopulmonary;   Laterality: Bilateral; . VIDEO BRONCHOSCOPY N/A 10/13/2013  Procedure: VIDEO BRONCHOSCOPY, REMOVAL OF ENDOBRONCHIAL LESION, PROBABLE FOREIGN BODY;  Surgeon: EGrace Isaac MD;  [place] [facility]  Service: Thoracic;  Laterality: N/A; . VIDEO BRONCHOSCOPY N/A 11/28/2013  Procedure: VIDEO BRONCHOSCOPY;  Surgeon: EGrace Isaac MD;  Location: [facility];  Service: Thoracic;  Laterality: N/A;  bronchoscopy for removal of foreign body HPI: This is a 62year old obese man with COPD, chronic cough, bipolar d/o, PNA, diaphragmatic paralysis, OSA on CPAP, major depressive disorder, hypertension, GERD who reports seeking medical attention for ongoing coughing spells associated with shortness of breath, frequent panic attacks. MD questioning aspiration in the setting of untreated GERD. MBS complete 10/02/13, normal oropharyngeal swallow. Bronchoscopy complete 09/2013 however noted foreign body aspiration in lower right lobe with f/o consultation and bronch complete by cardiothoracic surgeon removing and confirming foreign body as "vegetable material." Chest CT 03/07/17: Bibasilar mild airspace disease and mild consolidation in the posterior aspect of the lingula /LEFT upper lobe. Findings concerning for MULTIFOCAL PNEUMONIA. Aspiration pneumonitis or edema less favored.  No Data Recorded Assessment / Plan / Recommendation CHL IP CLINICAL IMPRESSIONS 03/08/2017 Clinical Impression Patient presents with a normal oropharyngeal swallow with full airway protection and pharyngeal clearance. Question of a small CP bar noted. This did not impact clearance of bolus or results in any episodes of backflow during today's study even when challenged with multiple  large consecutive boluses, however cannot r/o impact the larger quanitites of po over the course of a meal or with improper positioning. Education complete regarding general safe swallowing precautions. No further SLP needs indicated. Defer further eosphageal w/u or intervention to MD  if feel necessary based on additional w/u for recurrent PNA. At the least, question if PPI would be beneficial.  SLP Visit Diagnosis Dysphagia, unspecified (R13.10) Attention and concentration deficit following -- Frontal lobe and executive function deficit following -- Impact on safety and function Mild aspiration risk   CHL IP TREATMENT RECOMMENDATION 03/08/2017 Treatment Recommendations No treatment recommended at this time   Prognosis 10/02/2013 Prognosis for Safe Diet Advancement Good Barriers to Reach Goals -- Barriers/Prognosis Comment -- CHL IP DIET RECOMMENDATION 03/08/2017 SLP Diet Recommendations Regular solids;Thin liquid Liquid Administration via Cup;Straw Medication Administration Whole meds with liquid Compensations Slow rate;Small sips/bites;Follow solids with liquid Postural Changes Seated upright at 90 degrees;Remain semi-upright after after feeds/meals (Comment)   CHL IP OTHER RECOMMENDATIONS 03/08/2017 Recommended Consults Consider esophageal assessment Oral Care Recommendations Oral care BID Other Recommendations --   CHL IP FOLLOW UP RECOMMENDATIONS 03/08/2017 Follow up Recommendations None   No flowsheet data found.     CHL IP ORAL PHASE 03/08/2017 Oral Phase WFL Oral - Pudding Teaspoon -- Oral - Pudding Cup -- Oral - Honey Teaspoon -- Oral - Honey Cup -- Oral - Nectar Teaspoon -- Oral - Nectar Cup -- Oral - Nectar Straw -- Oral - Thin Teaspoon -- Oral - Thin Cup -- Oral - Thin Straw -- Oral - Puree -- Oral - Mech Soft -- Oral - Regular -- Oral - Multi-Consistency -- Oral - Pill -- Oral Phase - Comment --  CHL IP PHARYNGEAL PHASE 03/08/2017 Pharyngeal Phase WFL Pharyngeal- Pudding Teaspoon -- Pharyngeal -- Pharyngeal- Pudding Cup -- Pharyngeal -- Pharyngeal- Honey Teaspoon -- Pharyngeal -- Pharyngeal- Honey Cup -- Pharyngeal -- Pharyngeal- Nectar Teaspoon -- Pharyngeal -- Pharyngeal- Nectar Cup -- Pharyngeal -- Pharyngeal- Nectar Straw -- Pharyngeal -- Pharyngeal- Thin Teaspoon -- Pharyngeal --  Pharyngeal- Thin Cup -- Pharyngeal -- Pharyngeal- Thin Straw -- Pharyngeal -- Pharyngeal- Puree -- Pharyngeal -- Pharyngeal- Mechanical Soft -- Pharyngeal -- Pharyngeal- Regular -- Pharyngeal -- Pharyngeal- Multi-consistency -- Pharyngeal -- Pharyngeal- Pill -- Pharyngeal -- Pharyngeal Comment --  CHL IP CERVICAL ESOPHAGEAL PHASE 03/08/2017 Cervical Esophageal Phase WFL Pudding Teaspoon -- Pudding Cup -- Honey Teaspoon -- Honey Cup -- Nectar Teaspoon -- Nectar Cup -- Nectar Straw -- Thin Teaspoon -- Thin Cup -- Thin Straw -- Puree -- Mechanical Soft -- Regular -- Multi-consistency -- Pill -- Cervical Esophageal Comment -- CHL IP GO 10/02/2013 Functional Assessment Tool Used ASHA NOMS, clinical judgment Functional Limitations Swallowing Swallow Current Status (J3354) [place] Swallow Goal Status (T6256) [facility] Swallow Discharge Status (L8937) [place] Motor Speech Current Status (D4287) (None) Motor Speech Goal Status (G8115) (None) Motor Speech Goal Status (B2620) (None) Spoken Language Comprehension Current Status (B5597) (None) Spoken Language Comprehension Goal Status (C1638) (None) Spoken Language Comprehension Discharge Status (G5364) (None) Spoken Language Expression Current Status (W8032) (None) Spoken Language Expression Goal Status (Z2248) (None) Spoken Language Expression Discharge Status [id number] (None) Attention Current Status (B0488) (None) Attention Goal Status (Q9169) (None) Attention Discharge Status (I5038) (None) Memory Current Status (U8280) (None) Memory Goal Status (K3491) (None) Memory Discharge Status (P9150) (None) Voice Current Status (V6979) (None) Voice Goal Status (Y8016) (None) Voice Discharge Status (P5374) (None) Other Speech-Language Pathology Functional Limitation Current Status (M2707) (None) Other Speech-Language Pathology Functional Limitation Goal Status (E6754) (None) Other  Speech-Language Pathology Functional Limitation Discharge Status [phone]) (None) Gabriel Rainwater MA, CCC-SLP [phone] Gabriel Rainwater Meryl 03/08/2017, 11:56 AM                Labs:   Basic Metabolic Panel: Recent Labs  Lab 03/07/17 1829 03/08/17 0324 03/11/17 0500  NA 136 137 132*  K 4.1 4.3 4.1  CL 101 105 99*  CO2 _0 GLUCOSE 106* 108* 98  BUN _1 CREATININE 0.85 0.92 0.82  CALCIUM 8.9 8.4* 8.5*   GFR Estimated Creatinine Clearance: 122.2 mL/min (by C-G formula based on SCr of 0.82 mg/dL). Liver Function Tests: No results for input(s): AST, ALT, ALKPHOS, BILITOT, PROT, ALBUMIN in the last 168 hours. No results for input(s): LIPASE, AMYLASE in the last 168 hours. No results for input(s): AMMONIA in the last 168 hours. Coagulation profile No results for input(s): INR, PROTIME in the last 168 hours.  CBC: Recent Labs  Lab 03/07/17 1829 03/08/17 0324 03/11/17 0500  WBC 13.6* 11.2* 9.8  HGB 12.0* 10.5* 10.5*  HCT 36.3* 32.3* 32.6*  MCV 83.8 83.2 83.8  PLT 232 222 250   Cardiac Enzymes: No results for input(s): CKTOTAL, CKMB, CKMBINDEX, TROPONINI in the last 168 hours. BNP: Invalid input(s): POCBNP CBG: No results for input(s): GLUCAP in the last 168 hours. D-Dimer No results for input(s): DDIMER in the last 72 hours. Hgb A1c No results for input(s): HGBA1C in the last 72 hours. Lipid Profile No results for input(s): CHOL, HDL, LDLCALC, TRIG, CHOLHDL, LDLDIRECT in the last 72 hours. Thyroid function studies No results for input(s): TSH, T4TOTAL, T3FREE, THYROIDAB in the last 72 hours.  Invalid input(s): FREET3 Anemia work up No results for input(s): VITAMINB12, FOLATE, FERRITIN, TIBC, IRON, RETICCTPCT in the last 72 hours. Microbiology Recent Results (from the past 240 hour(s))  Culture, blood (Routine X 2) w Reflex to ID Panel     Status: None (Preliminary result)   Collection Time: 03/07/17  7:15 PM  Result Value Ref Range Status   Specimen Description BLOOD RIGHT HAND  Final   Special Requests   Final    BOTTLES DRAWN AEROBIC AND ANAEROBIC Blood Culture adequate  volume   Culture NO GROWTH 3 DAYS  Final   Report Status PENDING  Incomplete  Respiratory Panel by PCR     Status: None   Collection Time: 03/08/17  4:51 AM  Result Value Ref Range Status   Adenovirus NOT DETECTED NOT DETECTED Final   Coronavirus 229E NOT DETECTED NOT DETECTED Final   Coronavirus HKU1 NOT DETECTED NOT DETECTED Final   Coronavirus NL63 NOT DETECTED NOT DETECTED Final   Coronavirus OC43 NOT DETECTED NOT DETECTED Final   Metapneumovirus NOT DETECTED NOT DETECTED Final   Rhinovirus / Enterovirus NOT DETECTED NOT DETECTED Final   Influenza A NOT DETECTED NOT DETECTED Final   Influenza B NOT DETECTED NOT DETECTED Final   Parainfluenza Virus 1 NOT DETECTED NOT DETECTED Final   Parainfluenza Virus 2 NOT DETECTED NOT DETECTED Final   Parainfluenza Virus 3 NOT DETECTED NOT DETECTED Final   Parainfluenza Virus 4 NOT DETECTED NOT DETECTED Final   Respiratory Syncytial Virus NOT DETECTED NOT DETECTED Final   Bordetella pertussis NOT DETECTED NOT DETECTED Final   Chlamydophila pneumoniae NOT DETECTED NOT DETECTED Final   Mycoplasma pneumoniae NOT DETECTED NOT DETECTED Final  Culture, bal-quantitative     Status: None (Preliminary result)   Collection Time: 03/10/17  9:10 AM  Result Value Ref Range Status  Specimen Description BRONCHIAL ALVEOLAR LAVAGE  Final   Special Requests NONE  Final   Gram Stain   Final    FEW WBC PRESENT, PREDOMINANTLY PMN RARE GRAM NEGATIVE RODS RARE GRAM NEGATIVE COCCI RARE GRAM POSITIVE COCCI IN CLUSTERS    Culture CULTURE REINCUBATED FOR BETTER GROWTH  Final   Report Status PENDING  Incomplete     Discharge Instructions:   Discharge Instructions    Diet - low sodium heart healthy   Complete by:  As directed    Increase activity slowly   Complete by:  As directed      Allergies as of 03/11/2017      Reactions   Omeprazole Diarrhea, Nausea And Vomiting      Medication List    STOP taking these medications   ANORO ELLIPTA 62.5-25  MCG/INH Aepb Generic drug:  umeclidinium-vilanterol   famotidine 20 MG tablet Commonly known as:  PEPCID   naproxen sodium 220 MG tablet Commonly known as:  ALEVE   omeprazole 20 MG capsule Commonly known as:  PRILOSEC Replaced by:  pantoprazole 40 MG tablet     TAKE these medications   PROAIR HFA 108 (90 Base) MCG/ACT inhaler Generic drug:  albuterol Inhale 2 puffs into the lungs every 6 (six) hours as needed for wheezing or shortness of breath. What changed:  Another medication with the same name was changed. Make sure you understand how and when to take each.   albuterol (2.5 MG/3ML) 0.083% nebulizer solution Commonly known as:  PROVENTIL Take 3 mLs (2.5 mg total) by nebulization every 6 (six) hours as needed for wheezing or shortness of breath. What changed:    when to take this  additional instructions   amoxicillin-clavulanate 875-125 MG tablet Commonly known as:  AUGMENTIN Take 1 tablet by mouth 2 (two) times daily.   ferrous sulfate 325 (65 FE) MG tablet Take 325 mg by mouth at bedtime.   FLUoxetine 20 MG capsule Commonly known as:  PROZAC Take 20 mg by mouth See admin instructions. Take one capsule (20 mg) by mouth with a 40 mg capsule daily at bedtime for a total dose of 60 mg.   FLUoxetine 40 MG capsule Commonly known as:  PROZAC Take 40 mg by mouth See admin instructions. Take one capsule (40 mg) by mouth with a 20 mg capsule daily at bedtime for a total dose of 60 mg.   Fluticasone-Umeclidin-Vilant 100-62.5-25 MCG/INH Aepb Commonly known as:  TRELEGY ELLIPTA Inhale 1 puff into the lungs daily. What changed:  when to take this   furosemide 20 MG tablet Commonly known as:  LASIX Take 1 tablet (20 mg total) by mouth daily. Start taking on:  03/12/2017   gabapentin 800 MG tablet Commonly known as:  NEURONTIN Take 800 mg by mouth 3 (three) times daily.   HYDROcodone-homatropine 5-1.5 MG/5ML syrup Commonly known as:  HYCODAN Take 5 mLs by mouth every  8 (eight) hours as needed for cough. What changed:  Another medication with the same name was removed. Continue taking this medication, and follow the directions you see here.   hydrOXYzine 50 MG capsule Commonly known as:  VISTARIL Take 100 mg by mouth 3 (three) times daily.   losartan 100 MG tablet Commonly known as:  COZAAR Take 1 tablet (100 mg total) by mouth daily. What changed:  when to take this   Melatonin 5 MG Lozg Place 5 mg under the tongue at bedtime.   pantoprazole 40 MG tablet Commonly known as:  PROTONIX Take 1 tablet (40 mg total) by mouth 2 (two) times daily. Replaces:  omeprazole 20 MG capsule   PRESCRIPTION MEDICATION Inhale into the lungs at bedtime. CPAP   traZODone 100 MG tablet Commonly known as:  DESYREL Take 50-100 mg by mouth at bedtime.   triamcinolone ointment 0.5 % Commonly known as:  KENALOG Apply 1 application topically 2 (two) times daily. 7 days maximum. Need at least 10 day break after that. What changed:    when to take this  reasons to take this  additional instructions      Follow-up Information    Marin Olp, MD Follow up in 1 week(s).   Specialty:  Family Medicine Why:  BMP Contact information: [place] [address] [phone]        keep a ppointment at [facility] Follow up.            Time coordinating discharge: 35 min  Signed:  Geradine Girt   [facility] 03/11/2017, 3:33 PM

## 2017-03-11 NOTE — Telephone Encounter (Signed)
Pt is calling back [phone] 

## 2017-03-12 ENCOUNTER — Telehealth: Payer: Self-pay | Admitting: *Deleted

## 2017-03-12 LAB — CULTURE, BAL-QUANTITATIVE

## 2017-03-12 LAB — IMMUNOGLOBULINS A/E/G/M, SERUM
IGE (IMMUNOGLOBULIN E), SERUM: 465 [IU]/mL — AB (ref 0–100)
IGG (IMMUNOGLOBIN G), SERUM: 770 mg/dL (ref 700–1600)
IgA: 190 mg/dL (ref 61–437)
IgM (Immunoglobulin M), Srm: 49 mg/dL (ref 20–172)

## 2017-03-12 LAB — CULTURE, BAL-QUANTITATIVE W GRAM STAIN: Culture: 80000 — AB

## 2017-03-12 NOTE — Telephone Encounter (Signed)
Left voicemail requesting call back to complete TCM. 

## 2017-03-13 LAB — CULTURE, BLOOD (ROUTINE X 2)
CULTURE: NO GROWTH
Special Requests: ADEQUATE

## 2017-03-15 NOTE — Telephone Encounter (Signed)
noted thanks- hope he calls back

## 2017-03-15 NOTE — Telephone Encounter (Signed)
Left voicemail requesting call back.  

## 2017-03-15 NOTE — Telephone Encounter (Signed)
Per chart review: Admit date: 03/07/2017 Discharge date: 03/11/2017   Recommendations for Outpatient Follow-Up:   1. Added lasix for LE edema and diastolic CHF, follow BMP- adjust as needed- may just need PRN 2. Has appointment with [facility] tomm   Discharge Diagnosis:   Active Problems:   Chronic cough   GERD (gastroesophageal reflux disease)   OSA (obstructive sleep apnea)   Pneumonia   Discharge disposition:  Home:  Discharge Condition: Improved.  Diet recommendation: Low sodium, heart healthy  Wound care: None. _____________________________________________________________________ Transition Care Management Follow-up Telephone Call   Date discharged? 03/11/17   How have you been since you were released from the hospital? "long recovery"   Do you understand why you were in the hospital? yes   Do you understand the discharge instructions? yes   Where were you discharged to? Home   Items Reviewed:  Medications reviewed: yes  Allergies reviewed: yes  Dietary changes reviewed: yes  Referrals reviewed: yes   Functional Questionnaire:   Activities of Daily Living (ADLs):   He states they are independent in the following: ambulation States they require assistance with the following: None.   Any transportation issues/concerns?: no   Any patient concerns? no   Confirmed importance and date/time of follow-up visits scheduled yes  Patient declined to schedule an appointment. States he will call back after he reviews his schedule.  Confirmed with patient if condition begins to worsen call PCP or go to the ER.  Patient was given the office number and encouraged to call back with question or concerns.  : yes

## 2017-04-08 LAB — FUNGUS CULTURE RESULT

## 2017-04-08 LAB — FUNGUS CULTURE WITH STAIN

## 2017-04-08 LAB — FUNGAL ORGANISM REFLEX

## 2017-04-21 ENCOUNTER — Telehealth: Payer: Self-pay

## 2017-04-21 NOTE — Telephone Encounter (Signed)
Received a fax from [facility] advising that the pt may be over utilizing Hydrocodone and Gabapentin. SG has reviewed and nothing further is needed. FYI.

## 2017-04-22 LAB — ACID FAST CULTURE WITH REFLEXED SENSITIVITIES

## 2017-04-22 LAB — ACID FAST CULTURE WITH REFLEXED SENSITIVITIES (MYCOBACTERIA): Acid Fast Culture: NEGATIVE

## 2017-07-02 ENCOUNTER — Ambulatory Visit: Payer: BLUE CROSS/BLUE SHIELD | Admitting: Physician Assistant

## 2017-07-02 ENCOUNTER — Encounter: Payer: Self-pay | Admitting: Physician Assistant

## 2017-07-02 VITALS — BP 150/100 | HR 61 | Temp 97.9°F | Ht 70.0 in | Wt 267.0 lb

## 2017-07-02 DIAGNOSIS — J029 Acute pharyngitis, unspecified: Secondary | ICD-10-CM | POA: Diagnosis not present

## 2017-07-02 MED ORDER — MAGIC MOUTHWASH W/LIDOCAINE: ORAL | 0 refills | Status: DC

## 2017-07-02 NOTE — Progress Notes (Signed)
[name] is a 62 y.o. male here for a new problem.  I acted as a Neurosurgeon for [employer], PA-C Corky Mull, LPN  History of Present Illness:   Chief Complaint  Patient presents with  . Sore Throat    Sore Throat   This is a new problem. Episode onset: Started 2 days ago. The problem has been unchanged. There has been no fever. The pain is at a severity of 7/10. The pain is moderate. Associated symptoms include a hoarse voice, shortness of breath (COPD) and trouble swallowing. Pertinent negatives include no coughing. He has tried cool liquids (Naproxen x 1, ginger tea with honey) for the symptoms. The treatment provided mild relief.   He reports that he has had "sores" in his mouth in the past and he relates it to use of inhalers.  Past Medical History:  Diagnosis Date  . ADD (attention deficit disorder)   . Anxiety    Panic  . Bipolar disorder (HCC)   . GERD (gastroesophageal reflux disease)    ? they thought GERD caused cough.   . Mental disorder    ADD, bipolar  . Pneumonia 04/12/2013  . Shortness of breath   . Substance abuse (HCC)      Social History   Socioeconomic History  . Marital status: Married    Spouse name: Not on file  . Number of children: Not on file  . Years of education: Not on file  . Highest education level: Not on file  Occupational History  . Not on file  Social Needs  . Financial resource strain: Not on file  . Food insecurity:    Worry: Not on file    Inability: Not on file  . Transportation needs:    Medical: Not on file    Non-medical: Not on file  Tobacco Use  . Smoking status: Former Smoker    Packs/day: 1.00    Years: 20.00    Pack years: 20.00    Types: Cigarettes    Last attempt to quit: 02/16/1993    Years since quitting: 24.3  . Smokeless tobacco: Never Used  Substance and Sexual Activity  . Alcohol use: No    Comment: none at all  . Drug use: No  . Sexual activity: Yes  Lifestyle  . Physical activity:    Days  per week: Not on file    Minutes per session: Not on file  . Stress: Not on file  Relationships  . Social connections:    Talks on phone: Not on file    Gets together: Not on file    Attends religious service: Not on file    Active member of club or organization: Not on file    Attends meetings of clubs or organizations: Not on file    Relationship status: Not on file  . Intimate partner violence:    Fear of current or ex partner: Not on file    Emotionally abused: Not on file    Physically abused: Not on file    Forced sexual activity: Not on file  Other Topics Concern  . Not on file  Social History Narrative   Married. Lives with wife. 2 children. Soon to be grandfather 07/2016.       Freelance work/semi retired. Editing/writing.       Hobbies: enjoys writing poetry    Past Surgical History:  Procedure Laterality Date  . APPENDECTOMY  1988  . COLONOSCOPY    . TONSILLECTOMY    .  VIDEO BRONCHOSCOPY Bilateral 10/10/2013   Procedure: VIDEO BRONCHOSCOPY WITH FLUORO;  Surgeon: Oretha Milch, MD;  Location: [facility] ENDOSCOPY;  Service: Cardiopulmonary;  Laterality: Bilateral;  . VIDEO BRONCHOSCOPY N/A 10/13/2013   Procedure: VIDEO BRONCHOSCOPY, REMOVAL OF ENDOBRONCHIAL LESION, PROBABLE FOREIGN BODY;  Surgeon: Delight Ovens, MD;  Location: [facility] OR;  Service: Thoracic;  Laterality: N/A;  . VIDEO BRONCHOSCOPY N/A 11/28/2013   Procedure: VIDEO BRONCHOSCOPY;  Surgeon: Delight Ovens, MD;  Location: [facility] OR;  Service: Thoracic;  Laterality: N/A;  bronchoscopy for removal of foreign body  . VIDEO BRONCHOSCOPY Bilateral 03/10/2017   Procedure: VIDEO BRONCHOSCOPY WITH FLUORO;  Surgeon: Oretha Milch, MD;  Location: [facility] ENDOSCOPY;  Service: Cardiopulmonary;  Laterality: Bilateral;    Family History  Problem Relation Age of Onset  . Breast cancer Mother   . Alcohol abuse Father        unclear cause of death. 13  . Colon cancer Father   . Alcohol abuse Sister        substance as well  .  Alcohol abuse Brother        substance abuse  . Alcohol abuse Sister        substance abuse    Allergies  Allergen Reactions  . Omeprazole Diarrhea and Nausea And Vomiting    Current Medications:   Current Outpatient Medications:  .  albuterol (PROAIR HFA) 108 (90 Base) MCG/ACT inhaler, Inhale 2 puffs into the lungs every 6 (six) hours as needed for wheezing or shortness of breath., Disp: , Rfl:  .  albuterol (PROVENTIL) (2.5 MG/3ML) 0.083% nebulizer solution, Take 3 mLs (2.5 mg total) by nebulization every 6 (six) hours as needed for wheezing or shortness of breath. (Patient taking differently: Take 2.5 mg by nebulization See admin instructions. Use one vial (2.5 mg) via nebulization two - three times daily for wheezing and shortness of breath), Disp: 75 mL, Rfl: 12 .  ferrous sulfate 325 (65 FE) MG tablet, Take 325 mg by mouth at bedtime. , Disp: , Rfl:  .  FLUoxetine (PROZAC) 20 MG capsule, Take 20 mg by mouth See admin instructions. Take one capsule (20 mg) by mouth with a 40 mg capsule daily at bedtime for a total dose of 60 mg., Disp: , Rfl: 1 .  FLUoxetine (PROZAC) 40 MG capsule, Take 40 mg by mouth See admin instructions. Take one capsule (40 mg) by mouth with a 20 mg capsule daily at bedtime for a total dose of 60 mg., Disp: , Rfl: 1 .  Fluticasone-Umeclidin-Vilant (TRELEGY ELLIPTA) 100-62.5-25 MCG/INH AEPB, Inhale 1 puff into the lungs daily. (Patient taking differently: Inhale 1 puff into the lungs every evening. ), Disp: 2 each, Rfl: 0 .  gabapentin (NEURONTIN) 800 MG tablet, Take 600 mg by mouth 3 (three) times daily. , Disp: , Rfl: 2 .  hydrOXYzine (VISTARIL) 50 MG capsule, Take 100 mg by mouth 3 (three) times daily. , Disp: , Rfl: 2 .  losartan (COZAAR) 100 MG tablet, Take 1 tablet (100 mg total) by mouth daily. (Patient taking differently: Take 100 mg by mouth at bedtime. ), Disp: 90 tablet, Rfl: 3 .  Melatonin 5 MG LOZG, Place 5 mg under the tongue at bedtime., Disp: , Rfl:   .  PRESCRIPTION MEDICATION, Inhale into the lungs at bedtime. CPAP, Disp: , Rfl:  .  traZODone (DESYREL) 100 MG tablet, Take 50-100 mg by mouth at bedtime. , Disp: , Rfl:  .  triamcinolone ointment (KENALOG) 0.5 %, Apply 1  application topically 2 (two) times daily. 7 days maximum. Need at least 10 day break after that. (Patient taking differently: Apply 1 application topically 2 (two) times daily as needed (rash). 7 days maximum. Need at least 10 day break after that.), Disp: 45 g, Rfl: 2 .  furosemide (LASIX) 20 MG tablet, Take 1 tablet (20 mg total) by mouth daily. (Patient not taking: Reported on 07/02/2017), Disp: 30 tablet, Rfl: 0 .  magic mouthwash w/lidocaine SOLN, Swish and spit 5ml as needed for mouth pain., Disp: 120 mL, Rfl: 0 .  pantoprazole (PROTONIX) 40 MG tablet, Take 1 tablet (40 mg total) by mouth 2 (two) times daily. (Patient not taking: Reported on 07/02/2017), Disp: 60 tablet, Rfl: 0  Current Facility-Administered Medications:  .  dextromethorphan (DELSYM) 30 MG/5ML liquid 30 mg, 30 mg, Oral, Q12H PRN, Bevelyn Ngo, NP   Review of Systems:   Review of Systems  HENT: Positive for hoarse voice and trouble swallowing.   Respiratory: Positive for shortness of breath (COPD). Negative for cough.     Vitals:   Vitals:   07/02/17 1458  BP: (!) 150/100  Pulse: 61  Temp: 97.9 F (36.6 C)  TempSrc: Oral  SpO2: 95%  Weight: 267 lb (121.1 kg)  Height:  (1.778 m)     Body mass index is 38.31 kg/m.  Physical Exam:   Physical Exam  Constitutional: He appears well-developed. He is cooperative.  Non-toxic appearance. He does not have a sickly appearance. He does not appear ill. No distress.  HENT:  Head: Normocephalic and atraumatic.  Right Ear: Tympanic membrane, external ear and ear canal normal. Tympanic membrane is not erythematous, not retracted and not bulging.  Left Ear: Tympanic membrane, external ear and ear canal normal. Tympanic membrane is not  erythematous, not retracted and not bulging.  Nose: Mucosal edema and rhinorrhea present. Right sinus exhibits no maxillary sinus tenderness and no frontal sinus tenderness. Left sinus exhibits no maxillary sinus tenderness and no frontal sinus tenderness.  Mouth/Throat: Uvula is midline and mucous membranes are normal. Posterior oropharyngeal erythema present. No posterior oropharyngeal edema.  Small area of erythema (possible abrasion?) at the upper portion uvula, no purulent or bloody discharge; tonsils absent  Eyes: Conjunctivae and lids are normal.  Neck: Trachea normal.  Cardiovascular: Normal rate, regular rhythm, S1 normal, S2 normal and normal heart sounds.  Pulmonary/Chest: Effort normal and breath sounds normal. He has no decreased breath sounds. He has no wheezes. He has no rhonchi. He has no rales.  Lymphadenopathy:    He has no cervical adenopathy.  Neurological: He is alert.  Skin: Skin is warm, dry and intact.  Psychiatric: He has a normal mood and affect. His speech is normal and behavior is normal.  Nursing note and vitals reviewed.   Assessment and Plan:    Bronson was seen today for sore throat.  Diagnoses and all orders for this visit:  Pharyngitis, unspecified etiology  Other orders -     magic mouthwash w/lidocaine SOLN; Swish and spit 5ml as needed for mouth pain.   No red flags on exam.  Will initiate Magic Mouthwash per orders. Discussed taking medications as prescribed. Reviewed return precautions including worsening fever, SOB, worsening cough or other concerns. Push fluids and rest. I recommend that patient follow-up if symptoms worsen or persist despite treatment x 7-10 days, sooner if needed.  He has an appointment scheduled with his PCP in less than 2 weeks.  I recommended that he talk to  his PCP at that time if his symptoms still persist.   . Reviewed expectations re: course of current medical issues. . Discussed self-management of  symptoms. . Outlined signs and symptoms indicating need for more acute intervention. . Patient verbalized understanding and all questions were answered. . See orders for this visit as documented in the electronic medical record. . Patient received an After-Visit Summary.  CMA or LPN served as scribe during this visit. History, Physical, and Plan performed by medical provider. Documentation and orders reviewed and attested to.  Jarold Motto, PA-C

## 2017-07-02 NOTE — Patient Instructions (Signed)
Follow-up if symptoms worsen or persist despite treatment.

## 2017-07-08 ENCOUNTER — Ambulatory Visit: Payer: BLUE CROSS/BLUE SHIELD | Admitting: Family Medicine

## 2017-07-08 ENCOUNTER — Encounter: Payer: Self-pay | Admitting: Family Medicine

## 2017-07-08 VITALS — BP 158/98 | HR 97 | Temp 98.1°F | Resp 16 | Ht 70.0 in | Wt 265.0 lb

## 2017-07-08 DIAGNOSIS — J029 Acute pharyngitis, unspecified: Secondary | ICD-10-CM | POA: Diagnosis not present

## 2017-07-08 LAB — POCT RAPID STREP A (OFFICE): RAPID STREP A SCREEN: NEGATIVE

## 2017-07-08 MED ORDER — GI COCKTAIL ~~LOC~~
30.0000 mL | Freq: Once | ORAL | Status: AC
  Administered 2017-07-08: 30 mL via ORAL

## 2017-07-08 MED ORDER — NYSTATIN 100000 UNIT/ML MT SUSP: 5.0000 mL | Freq: Four times a day (QID) | OROMUCOSAL | 0 refills | Status: DC

## 2017-07-08 MED ORDER — AMOXICILLIN 875 MG PO TABS: 875.0000 mg | ORAL_TABLET | Freq: Two times a day (BID) | ORAL | 0 refills | Status: DC

## 2017-07-08 MED ORDER — METHYLPREDNISOLONE ACETATE 80 MG/ML IJ SUSP
80.0000 mg | Freq: Once | INTRAMUSCULAR | Status: AC
  Administered 2017-07-08: 80 mg via INTRAMUSCULAR

## 2017-07-08 NOTE — Progress Notes (Signed)
    Subjective:  [name] is a 62 y.o. male who presents today for same-day appointment with a chief complaint of sore throat.   HPI:  Sore Throat,  Pt seen about 6 days ago for this.  Diagnosed with nonspecified pharyngitis.  He was given a prescription for magic mouthwash.  Symptoms of worsened over the last 6 days.  He has tried using a mouthwash without significant relief in his symptoms.  He has not tried any other treatments.  Overall symptoms have lasted for the last 2 weeks.  No fevers or chills.  Pain is worse with swallowing.  He has a lot of phlegm production related to his underlying lung disease.  No other obvious alleviating or aggravating factors.  ROS: Per HPI  PMH: He reports that he quit smoking about 24 years ago. His smoking use included cigarettes. He has a 20.00 pack-year smoking history. He has never used smokeless tobacco. He reports that he does not drink alcohol or use drugs.  Objective:  Physical Exam: BP (!) 158/98   Pulse 97   Temp 98.1 F (36.7 C) (Oral)   Resp 16   Ht  (1.778 m)   Wt 265 lb (120.2 kg)   SpO2 94%   BMI 38.02 kg/m   Gen: NAD, resting comfortably HEENT: TMs clear bilaterally.  Oropharynx slightly erythematous without exudate.  Small erythematous patch noted on soft palate. CV: RRR with no murmurs appreciated Pulm: NWOB, CTAB with no crackles, wheezes, or rhonchi  Results for orders placed or performed in visit on 07/08/17 (from the past 24 hour(s))  POCT rapid strep A     Status: None   Collection Time: 07/08/17  8:37 AM  Result Value Ref Range   Rapid Strep A Screen Negative Negative   Assessment/Plan:  Sore throat Unclear etiology.  Rapid strep negative.  Possibly related to irritation or candidiasis from his inhaled corticosteroid.  Will empirically treat with nystatin swish and swallow for the next week.  We will also give 80 mg IM Depo-Medrol today to alleviate his symptoms.  Patient was also given a GI cocktail for  symptom relief today.  Given that symptoms have persisted for 2 weeks, I sent in a "pocket prescription" for amoxicillin.  Patient was given strict instruction to not start unless symptoms worsen or do not improve over the next several days.  Encouraged good oral hydration.  Return precautions reviewed.  Follow-up as needed.  Hypertension Elevated today in setting of acute illness.  Has typically been well controlled.  Will not make medication changes today.  Advised patient to follow-up with his PCP in 1 to 2 weeks.  Discussed home blood pressure monitoring with goal 140/90 or lower.  Katina Degree. Jimmey Ralph, MD 07/08/2017 9:08 AM

## 2017-07-08 NOTE — Patient Instructions (Signed)
You may be having an irritation related to your inhaler. Please start the nystatin.  We will give you an injection of an anti-inflammatory steroid called depomedrol today. We will also give you a medication to numb your throat.  Your strep test is negative.  I will send in a prescription for amoxicillin.  Please start this medication if your symptoms worsen or do not improve over the next few days.  Take care, Dr Jimmey Ralph

## 2017-07-13 ENCOUNTER — Ambulatory Visit: Payer: BLUE CROSS/BLUE SHIELD | Admitting: Family Medicine

## 2017-07-20 ENCOUNTER — Encounter: Payer: Self-pay | Admitting: Family Medicine

## 2017-07-20 ENCOUNTER — Ambulatory Visit: Payer: BLUE CROSS/BLUE SHIELD | Admitting: Family Medicine

## 2017-07-20 VITALS — BP 140/102 | HR 75 | Temp 98.4°F | Ht 70.0 in | Wt 257.8 lb

## 2017-07-20 DIAGNOSIS — Z1159 Encounter for screening for other viral diseases: Secondary | ICD-10-CM | POA: Diagnosis not present

## 2017-07-20 DIAGNOSIS — R5383 Other fatigue: Secondary | ICD-10-CM

## 2017-07-20 DIAGNOSIS — I10 Essential (primary) hypertension: Secondary | ICD-10-CM | POA: Diagnosis not present

## 2017-07-20 MED ORDER — AMLODIPINE BESYLATE 5 MG PO TABS
5.0000 mg | ORAL_TABLET | Freq: Every day | ORAL | 5 refills | Status: DC
Start: 2017-07-20 — End: 2017-09-09

## 2017-07-20 NOTE — Progress Notes (Signed)
Subjective:  Zachary BarterJohn B [name] is a 62 y.o. year old very pleasant male patient who presents for/with See problem oriented charting ROS- admits to fatigue, depressed mood. Has had fleeting suicidal thoughts but no plan- able to contract for safety (would call Dr. Carver FilaBook immediately). Denies chest pain. No increase in baseline shortness of breath   Past Medical History-  Patient Active Problem List   Diagnosis Date Noted  . Hyponatremia 10/13/2016    Priority: High  . COPD (chronic obstructive pulmonary disease) (HCC) 02/27/2015    Priority: High  . Alcohol dependence (HCC) 07/29/2011    Priority: High  . OSA (obstructive sleep apnea) 01/11/2017    Priority: Medium  . Hypertension 07/09/2016    Priority: Medium  . History of adenomatous polyp of colon 07/09/2016    Priority: Medium  . Hand arthritis 07/09/2016    Priority: Medium  . Chronic maxillary sinusitis 09/27/2013    Priority: Medium  . Bipolar affective disorder, depressed, moderate (HCC) 07/29/2011    Priority: Medium    Class: Chronic  . Obesity, Class II, BMI 35-39.9 07/09/2016    Priority: Low  . Edema 07/09/2016    Priority: Low  . Dyspnea on exertion 01/18/2015    Priority: Low  . Lung mass 10/10/2013    Priority: Low  . GERD (gastroesophageal reflux disease) 09/27/2013    Priority: Low  . Chronic cough 06/21/2013    Priority: Low  . Pneumonia 03/08/2017  . COPD with acute exacerbation (HCC) 01/19/2017  . Rash 01/11/2017    Medications- reviewed and updated Current Outpatient Medications  Medication Sig Dispense Refill  . albuterol (PROAIR HFA) 108 (90 Base) MCG/ACT inhaler Inhale 2 puffs into the lungs every 6 (six) hours as needed for wheezing or shortness of breath.    Marland Kitchen. albuterol (PROVENTIL) (2.5 MG/3ML) 0.083% nebulizer solution Take 3 mLs (2.5 mg total) by nebulization every 6 (six) hours as needed for wheezing or shortness of breath. (Patient taking differently: Take 2.5 mg by nebulization See admin  instructions. Use one vial (2.5 mg) via nebulization two - three times daily for wheezing and shortness of breath) 75 mL 12  . ferrous sulfate 325 (65 FE) MG tablet Take 325 mg by mouth at bedtime.     Marland Kitchen. FLUoxetine (PROZAC) 20 MG capsule Take 20 mg by mouth See admin instructions. Take one capsule (20 mg) by mouth with a 40 mg capsule daily at bedtime for a total dose of 60 mg.  1  . FLUoxetine (PROZAC) 40 MG capsule Take 40 mg by mouth See admin instructions. Take one capsule (40 mg) by mouth with a 20 mg capsule daily at bedtime for a total dose of 60 mg.  1  . Fluticasone-Umeclidin-Vilant (TRELEGY ELLIPTA) 100-62.5-25 MCG/INH AEPB Inhale 1 puff into the lungs daily. (Patient taking differently: Inhale 1 puff into the lungs every evening. ) 2 each 0  . gabapentin (NEURONTIN) 800 MG tablet Take 600 mg by mouth 3 (three) times daily.   2  . hydrOXYzine (VISTARIL) 50 MG capsule Take 100 mg by mouth 3 (three) times daily.   2  . losartan (COZAAR) 100 MG tablet Take 1 tablet (100 mg total) by mouth daily. (Patient taking differently: Take 100 mg by mouth at bedtime. ) 90 tablet 3  . Melatonin 5 MG LOZG Place 5 mg under the tongue at bedtime.    Marland Kitchen. PRESCRIPTION MEDICATION Inhale into the lungs at bedtime. CPAP    . traZODone (DESYREL) 100 MG tablet Take  50-100 mg by mouth at bedtime.     Marland Kitchen amLODipine (NORVASC) 5 MG tablet Take 1 tablet (5 mg total) by mouth daily. 30 tablet 5   No current facility-administered medications for this visit.     Objective: BP (!) 140/102 (BP Location: Left Arm, Patient Position: Sitting, Cuff Size: Large)   Pulse 75   Temp 98.4 F (36.9 C) (Oral)   Ht 5\' 10"  (1.778 m)   Wt 257 lb 12.8 oz (116.9 kg)   SpO2 96%   BMI 36.99 kg/m  Gen: NAD, resting comfortably Tympanic membrane normal, oropharynx normal, there is normal CV: RRR no murmurs rubs or gallops Lungs: CTAB no crackles, wheeze, rhonchi Abdomen: soft/nontender/nondistended Ext: no edema Skin: warm,  dry Psych: Depressed mood  Assessment/Plan:  Fatigue S:  has felt weak overall for last month even after recovering from illness. He is using cpap most nights and even with that feels fatigued. Prior had some improvement in breathing on cpap but still felt fatigued per his recollection. No regular headaches with this like he had back fall last year.   Admits to depressed mood, feeling run down. Fleeting suicidal thoughts noted but has no plan- and would call Dr. Carver [name] immediately if thoughts became more pervasive A/P: for fatigue we will check -Cbc, cmp, tsh . I am very worried about depression as cause and asked him to call/text Dr. Carver [name] (his psychiatrist)  immediately instead of waiting to see him in a few weeks. He has felt run down with high BP before so we will work to correct that.   Hypertension S: controlled poorly on losartan 100mg . Repeat BP by [name] still elevated. Appears seen in may twice and BP high too though was in middle of acute illness so no adjustments made.   Home #s- has not checked recently BP Readings from Last 3 Encounters:  07/20/17 (!) 140/102  07/08/17 (!) 158/98  07/02/17 (!) 150/100  A/P: We discussed blood pressure goal of <140/90 and he is above goal.  We will add amlodipine 5 mg to his losartan 100 mg and recheck within 3 weeks  Wanted 1 week follow up- patient asks about prolonging to make sure he has time to see Dr. Carver [name]  Lab/Order associations: Need for hepatitis C screening test - Plan: Hepatitis C Antibody, Hepatitis C Antibody  Fatigue, unspecified type - Plan: Comprehensive metabolic panel, TSH, CBC with Differential/Platelet  Meds ordered this encounter  Medications  . amLODipine (NORVASC) 5 MG tablet    Sig: Take 1 tablet (5 mg total) by mouth daily.    Dispense:  30 tablet    Refill:  5    Return precautions advised.  Tana Conch, MD

## 2017-07-20 NOTE — Assessment & Plan Note (Signed)
S: controlled poorly on losartan 100mg . Repeat BP by Asher MuirJamie still elevated. Appears seen in may twice and BP high too though was in middle of acute illness so no adjustments made.   Home #s- has not checked recently BP Readings from Last 3 Encounters:  07/20/17 (!) 140/102  07/08/17 (!) 158/98  07/02/17 (!) 150/100  A/P: We discussed blood pressure goal of <140/90 and he is above goal.  We will add amlodipine 5 mg to his losartan 100 mg and recheck within 3 weeks

## 2017-07-20 NOTE — Patient Instructions (Signed)
Start amlodipine 5 mg addition to losartan 100 mg  Follow-up in 7 to 21 days for blood pressure recheck  Please text Dr. Carver Filabook today to let him know you are feeling worse and that we are updating blood work  Please stop by lab before you go

## 2017-07-21 LAB — CBC WITH DIFFERENTIAL/PLATELET
BASOS ABS: 0.1 10*3/uL (ref 0.0–0.1)
Basophils Relative: 1 % (ref 0.0–3.0)
EOS ABS: 0.1 10*3/uL (ref 0.0–0.7)
Eosinophils Relative: 1.1 % (ref 0.0–5.0)
HEMATOCRIT: 42.7 % (ref 39.0–52.0)
HEMOGLOBIN: 14.3 g/dL (ref 13.0–17.0)
LYMPHS PCT: 27.2 % (ref 12.0–46.0)
Lymphs Abs: 2.5 10*3/uL (ref 0.7–4.0)
MCHC: 33.4 g/dL (ref 30.0–36.0)
MCV: 84.5 fl (ref 78.0–100.0)
MONOS PCT: 6.1 % (ref 3.0–12.0)
Monocytes Absolute: 0.6 10*3/uL (ref 0.1–1.0)
Neutro Abs: 6 10*3/uL (ref 1.4–7.7)
Neutrophils Relative %: 64.6 % (ref 43.0–77.0)
Platelets: 319 10*3/uL (ref 150.0–400.0)
RBC: 5.05 Mil/uL (ref 4.22–5.81)
RDW: 16.1 % — ABNORMAL HIGH (ref 11.5–15.5)
WBC: 9.2 10*3/uL (ref 4.0–10.5)

## 2017-07-21 LAB — COMPREHENSIVE METABOLIC PANEL
ALK PHOS: 88 U/L (ref 39–117)
ALT: 30 U/L (ref 0–53)
AST: 25 U/L (ref 0–37)
Albumin: 4.4 g/dL (ref 3.5–5.2)
BUN: 12 mg/dL (ref 6–23)
CALCIUM: 9.4 mg/dL (ref 8.4–10.5)
CO2: 27 mEq/L (ref 19–32)
Chloride: 96 mEq/L (ref 96–112)
Creatinine, Ser: 0.84 mg/dL (ref 0.40–1.50)
GFR: 97.76 mL/min (ref >60.00)
GLUCOSE: 94 mg/dL (ref 70–99)
POTASSIUM: 4.6 meq/L (ref 3.5–5.1)
Sodium: 131 mEq/L — ABNORMAL LOW (ref 135–145)
TOTAL PROTEIN: 6.9 g/dL (ref 6.0–8.3)
Total Bilirubin: 1 mg/dL (ref 0.2–1.2)

## 2017-07-21 LAB — HEPATITIS C ANTIBODY
Hepatitis C Ab: NONREACTIVE
SIGNAL TO CUT-OFF: 0.05 (ref <1.00)

## 2017-07-21 LAB — TSH: TSH: 1.09 u[IU]/mL (ref 0.35–4.50)

## 2017-07-22 ENCOUNTER — Encounter: Payer: Self-pay | Admitting: Family Medicine

## 2017-07-22 NOTE — Progress Notes (Signed)
Hepatitis C negative Kidney and liver largely normal.  Your sodium was slightly low but it tends to be slightly low. Your thyroid was normal Your CBC was normal (blood counts, infection fighting cells, platelets).  Were you able to speak with Dr. Carver Filabook yet?

## 2017-09-02 ENCOUNTER — Ambulatory Visit (HOSPITAL_COMMUNITY)
Admission: RE | Admit: 2017-09-02 | Discharge: 2017-09-02 | Disposition: A | Discharge status: Home / Self Care | Payer: BLUE CROSS/BLUE SHIELD | Source: Home / Self Care | Attending: Psychiatry | Admitting: Psychiatry

## 2017-09-02 ENCOUNTER — Encounter (HOSPITAL_COMMUNITY): Payer: Self-pay | Admitting: Emergency Medicine

## 2017-09-02 ENCOUNTER — Emergency Department (HOSPITAL_COMMUNITY)
Admission: EM | Admit: 2017-09-02 | Discharge: 2017-09-03 | Disposition: A | Discharge status: Other Acute Inpatient Hospital | Payer: BLUE CROSS/BLUE SHIELD | Attending: Emergency Medicine | Admitting: Emergency Medicine

## 2017-09-02 DIAGNOSIS — F332 Major depressive disorder, recurrent severe without psychotic features: Secondary | ICD-10-CM

## 2017-09-02 DIAGNOSIS — J449 Chronic obstructive pulmonary disease, unspecified: Secondary | ICD-10-CM | POA: Diagnosis not present

## 2017-09-02 DIAGNOSIS — Z79899 Other long term (current) drug therapy: Secondary | ICD-10-CM | POA: Insufficient documentation

## 2017-09-02 DIAGNOSIS — I10 Essential (primary) hypertension: Secondary | ICD-10-CM | POA: Diagnosis not present

## 2017-09-02 DIAGNOSIS — F419 Anxiety disorder, unspecified: Secondary | ICD-10-CM | POA: Diagnosis not present

## 2017-09-02 DIAGNOSIS — F3132 Bipolar disorder, current episode depressed, moderate: Secondary | ICD-10-CM | POA: Diagnosis present

## 2017-09-02 DIAGNOSIS — Z008 Encounter for other general examination: Secondary | ICD-10-CM | POA: Diagnosis present

## 2017-09-02 DIAGNOSIS — R45851 Suicidal ideations: Secondary | ICD-10-CM | POA: Diagnosis not present

## 2017-09-02 LAB — COMPREHENSIVE METABOLIC PANEL
ALBUMIN: 4.1 g/dL (ref 3.5–5.0)
ALT: 26 U/L (ref 0–44)
ANION GAP: 10 (ref 5–15)
AST: 23 U/L (ref 15–41)
Alkaline Phosphatase: 77 U/L (ref 38–126)
BUN: 15 mg/dL (ref 8–23)
CHLORIDE: 102 mmol/L (ref 98–111)
CO2: 26 mmol/L (ref 22–32)
CREATININE: 0.93 mg/dL (ref 0.61–1.24)
Calcium: 8.8 mg/dL — ABNORMAL LOW (ref 8.9–10.3)
GFR calc non Af Amer: >60 mL/min (ref >60)
GLUCOSE: 102 mg/dL — AB (ref 70–99)
Potassium: 4.3 mmol/L (ref 3.5–5.1)
SODIUM: 138 mmol/L (ref 135–145)
Total Bilirubin: 0.9 mg/dL (ref 0.3–1.2)
Total Protein: 6.7 g/dL (ref 6.5–8.1)

## 2017-09-02 LAB — CBC
HCT: 36.4 % — ABNORMAL LOW (ref 39.0–52.0)
Hemoglobin: 12.2 g/dL — ABNORMAL LOW (ref 13.0–17.0)
MCH: 27.9 pg (ref 26.0–34.0)
MCHC: 33.5 g/dL (ref 30.0–36.0)
MCV: 83.1 fL (ref 78.0–100.0)
PLATELETS: 255 10*3/uL (ref 150–400)
RBC: 4.38 MIL/uL (ref 4.22–5.81)
RDW: 13.7 % (ref 11.5–15.5)
WBC: 8 10*3/uL (ref 4.0–10.5)

## 2017-09-02 LAB — I-STAT TROPONIN, ED: TROPONIN I, POC: 0 ng/mL (ref 0.00–0.08)

## 2017-09-02 LAB — ETHANOL

## 2017-09-02 LAB — ACETAMINOPHEN LEVEL: Acetaminophen (Tylenol), Serum: <10 ug/mL — ABNORMAL LOW (ref 10–30)

## 2017-09-02 LAB — SALICYLATE LEVEL

## 2017-09-02 MED ORDER — LOSARTAN POTASSIUM 50 MG PO TABS
100.0000 mg | ORAL_TABLET | Freq: Every day | ORAL | Status: DC
  Administered 2017-09-03: 100 mg via ORAL
  Filled 2017-09-02: qty 2

## 2017-09-02 MED ORDER — FLUTICASONE FUROATE-VILANTEROL 100-25 MCG/INH IN AEPB
1.0000 | INHALATION_SPRAY | Freq: Every day | RESPIRATORY_TRACT | Status: DC
  Filled 2017-09-02: qty 28

## 2017-09-02 MED ORDER — HYDROXYZINE HCL 25 MG PO TABS
100.0000 mg | ORAL_TABLET | Freq: Three times a day (TID) | ORAL | Status: DC
  Administered 2017-09-02 – 2017-09-03 (×2): 100 mg via ORAL
  Filled 2017-09-02 (×2): qty 4

## 2017-09-02 MED ORDER — MELATONIN 5 MG SL LOZG: 5.0000 mg | LOZENGE | Freq: Every day | SUBLINGUAL | Status: DC

## 2017-09-02 MED ORDER — FLUTICASONE-UMECLIDIN-VILANT 100-62.5-25 MCG/INH IN AEPB: 1.0000 | INHALATION_SPRAY | Freq: Every evening | RESPIRATORY_TRACT | Status: DC

## 2017-09-02 MED ORDER — UMECLIDINIUM BROMIDE 62.5 MCG/INH IN AEPB
1.0000 | INHALATION_SPRAY | Freq: Every day | RESPIRATORY_TRACT | Status: DC
  Administered 2017-09-03: 1 via RESPIRATORY_TRACT
  Filled 2017-09-02: qty 7

## 2017-09-02 MED ORDER — MELATONIN 5 MG PO TABS
5.0000 mg | ORAL_TABLET | Freq: Every day | ORAL | Status: DC
  Administered 2017-09-02: 5 mg via ORAL
  Filled 2017-09-02: qty 1

## 2017-09-02 MED ORDER — AMLODIPINE BESYLATE 5 MG PO TABS
5.0000 mg | ORAL_TABLET | Freq: Every day | ORAL | Status: DC
  Administered 2017-09-02 – 2017-09-03 (×2): 5 mg via ORAL
  Filled 2017-09-02 (×2): qty 1

## 2017-09-02 MED ORDER — FERROUS SULFATE 325 (65 FE) MG PO TABS
325.0000 mg | ORAL_TABLET | Freq: Every day | ORAL | Status: DC
  Administered 2017-09-02: 325 mg via ORAL
  Filled 2017-09-02: qty 1

## 2017-09-02 MED ORDER — ACETAMINOPHEN 325 MG PO TABS: 650.0000 mg | ORAL_TABLET | ORAL | Status: DC | PRN

## 2017-09-02 MED ORDER — VORTIOXETINE HBR 5 MG PO TABS
5.0000 mg | ORAL_TABLET | Freq: Every day | ORAL | Status: DC
  Administered 2017-09-03: 5 mg via ORAL
  Filled 2017-09-02: qty 1

## 2017-09-02 MED ORDER — ALBUTEROL SULFATE HFA 108 (90 BASE) MCG/ACT IN AERS: 2.0000 | INHALATION_SPRAY | Freq: Four times a day (QID) | RESPIRATORY_TRACT | Status: DC | PRN

## 2017-09-02 MED ORDER — TRAZODONE HCL 50 MG PO TABS
50.0000 mg | ORAL_TABLET | Freq: Every day | ORAL | Status: DC
  Administered 2017-09-02: 100 mg via ORAL
  Filled 2017-09-02: qty 2

## 2017-09-02 MED ORDER — HYDROXYZINE PAMOATE 50 MG PO CAPS: 100.0000 mg | ORAL_CAPSULE | Freq: Three times a day (TID) | ORAL | Status: DC

## 2017-09-02 MED ORDER — GABAPENTIN 300 MG PO CAPS
600.0000 mg | ORAL_CAPSULE | Freq: Three times a day (TID) | ORAL | Status: DC
  Administered 2017-09-02 – 2017-09-03 (×2): 600 mg via ORAL
  Filled 2017-09-02 (×2): qty 2

## 2017-09-02 NOTE — ED Notes (Signed)
Pt wanded by security. Pt has 1 black bookbag and 1 patient belonging bag taken back with him to TCU from triage.

## 2017-09-02 NOTE — ED Notes (Signed)
Bed: WA29 Expected date:  Expected time:  Means of arrival:  Comments: Triage 4  

## 2017-09-02 NOTE — ED Notes (Signed)
Respiratory therapy at bedside to place pt on BIPAP.  No distress noted,
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
## 2017-09-02 NOTE — ED Notes (Addendum)
Pt requesting CPAP for sleep.  PA Alyssa notified.

## 2017-09-02 NOTE — Progress Notes (Signed)
[facility], pt stated that he had received TMS and ECT treatments in his past, and found them helpful. Pt reported of being open to receiving the treatment again. Reola Calkinsravis Money, NP requested CSW contact [facility] to check for bed availability as the facility provides ECT. [facility] with [facility]TS and notified them of pt disposition. They are currently at capacity and but will keep pt's information for review.   Wells GuilesSarah Giordano Getman, LCSW, LCAS Disposition CSW [facility]/TTS [phone]

## 2017-09-02 NOTE — BHH Counselor (Addendum)
[facility]. Disposition was discussed with Arlyss RepressAlyssa, PA and Joanie CoddingtonLatricia, RN. TTS to seek placement.   Redmond Pullingreylese D Lc Joynt, MS, [facility], [facility] Triage Specialist [phone]

## 2017-09-02 NOTE — H&P (Signed)
Behavioral Health Medical Screening Exam  Zachary BarterJohn B [name] is an 62 y.o. male.  Total Time spent with patient: 30 minutes  Psychiatric Specialty Exam: Physical Exam  Nursing note and vitals reviewed. Constitutional: He is oriented to person, place, and time. He appears well-developed and well-nourished.  Cardiovascular: Normal rate.  Respiratory: Effort normal. He has wheezes.  Musculoskeletal: Normal range of motion.  Neurological: He is alert and oriented to person, place, and time.  Skin: Skin is warm.    Review of Systems  Constitutional: Negative.   HENT: Negative.   Eyes: Negative.   Respiratory: Positive for shortness of breath.   Cardiovascular: Negative.   Gastrointestinal: Negative.   Genitourinary: Negative.   Musculoskeletal: Negative.   Skin: Negative.   Neurological: Negative.   Endo/Heme/Allergies: Negative.   Psychiatric/Behavioral: Positive for depression and suicidal ideas. Negative for hallucinations and substance abuse. The patient is nervous/anxious.     Blood pressure (!) 136/99, pulse 93, temperature 98.5 F (36.9 C), SpO2 97 %.There is no height or weight on file to calculate BMI.  General Appearance: Casual and Fairly Groomed  Eye Contact:  Fair  Speech:  Clear and Coherent  Volume:  Decreased  Mood:  Depressed and Hopeless  Affect:  Depressed and Flat  Thought Process:  Linear and Descriptions of Associations: Intact  Orientation:  Full (Time, Place, and Person)  Thought Content:  WDL  Suicidal Thoughts:  Yes.  with intent/plan  Homicidal Thoughts:  No  Memory:  Immediate;   Good Recent;   Good Remote;   Good  Judgement:  Fair  Insight:  Fair  Psychomotor Activity:  Decreased  Concentration: Concentration: Good  Recall:  Good  Fund of Knowledge:Good  Language: Good  Akathisia:  No  Handed:  Right  AIMS (if indicated):     Assets:  Communication Skills Desire for Improvement Financial Resources/Insurance Housing Social  Support Transportation  Sleep:       Musculoskeletal: Strength & Muscle Tone: within normal limits Gait & Station: normal Patient leans: N/A  Blood pressure (!) 136/99, pulse 93, temperature 98.5 F (36.9 C), SpO2 97 %.  Recommendations:  Based on my evaluation the patient appears to have an emergency medical condition for which I recommend the patient be transferred to the emergency department for further evaluation. Patient reports shortness of breath today.  Gerlene Burdockravis B Deniz Eskridge, FNP 09/02/2017, 5:05 PM

## 2017-09-02 NOTE — ED Provider Notes (Signed)
[facility]   CSN: [id number] Arrival date & time: 09/02/17  1722     History   Chief Complaint Chief Complaint  Patient presents with  . Suicidal    HPI [name] is a 62 y.o. male.  HPI  Patient is a 61 year old male with a history of anxiety, depression, COPD, HTN, bronchiectasis, presenting for suicidal ideation.  Patient reports that he has been so overwhelmed by his family and the negative events in his life, that he "just wants to die".  Patient reports that his thoughts include running his car into a solid object, or jumping off a pedestrian bridge.  Patient denies attempting suicide before.  Patient reports that he has had difficulty controlling his depression over the years, and reports he is followed by Dr. Carver Fila [facility] in [place].  He reports that his daughters have moved home, and they are constantly fighting with him, and acting unappreciative.  Patient reports that his wife also is very argumentative, and his family is not supportive of his mental health struggles.  Patient reports he has had multiple titrations of his depression medications, most recently beginning Trintellix.  Patient was he took his inhaler today, and is not short of breath.  Patient reports he had a mild chest pain earlier when he was very worked up, which he reports that he typically feels this when he is angry.  Patient is a symptom medic of chest pain at present.  Patient denies any nausea, vomiting, abdominal pain, headaches, or increasing lower extremity edema.  Patient reports that he is clean of alcohol use, and does not use any illicit drugs.  Past Medical History:  Diagnosis Date  . ADD (attention deficit disorder)   . Anxiety    Panic  . Bipolar disorder (HCC)   . GERD (gastroesophageal reflux disease)    ? they thought GERD caused cough.   . Mental disorder    ADD, bipolar  . Pneumonia 04/12/2013  . Shortness of breath   .  Substance abuse [facility])     Patient Active Problem List   Diagnosis Date Noted  . Pneumonia 03/08/2017  . COPD with acute exacerbation (HCC) 01/19/2017  . OSA (obstructive sleep apnea) 01/11/2017  . Rash 01/11/2017  . Hyponatremia 10/13/2016  . Hypertension 07/09/2016  . History of adenomatous polyp of colon 07/09/2016  . 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 Obesity, Class II, BMI 35-39.9 07/09/2016  . Edema 07/09/2016  . COPD (chronic obstructive pulmonary disease) (HCC) 02/27/2015  . Dyspnea on exertion 01/18/2015  . Lung mass 10/10/2013  . Chronic maxillary sinusitis 09/27/2013  . GERD (gastroesophageal reflux disease) 09/27/2013  . Chronic cough 06/21/2013  . Alcohol dependence (HCC) 07/29/2011  . Bipolar affective disorder, depressed, moderate (HCC) 07/29/2011    Class: Chronic    Past Surgical History:  Procedure Laterality Date  . APPENDECTOMY  1988  . COLONOSCOPY    . TONSILLECTOMY    . VIDEO BRONCHOSCOPY Bilateral 10/10/2013   Procedure: VIDEO BRONCHOSCOPY WITH FLUORO;  Surgeon: Oretha Milch, MD;  Location: [facility] ENDOSCOPY;  Service: Cardiopulmonary;  Laterality: Bilateral;  . VIDEO BRONCHOSCOPY N/A 10/13/2013   Procedure: VIDEO BRONCHOSCOPY, REMOVAL OF ENDOBRONCHIAL LESION, PROBABLE FOREIGN BODY;  Surgeon: Delight Ovens, MD;  Location: [facility] OR;  Service: Thoracic;  Laterality: N/A;  . VIDEO BRONCHOSCOPY N/A 11/28/2013   Procedure: VIDEO BRONCHOSCOPY;  Surgeon: Delight Ovens, MD;  Location: [facility] OR;  Service: Thoracic;  Laterality: N/A;  bronchoscopy  for removal of foreign body  . VIDEO BRONCHOSCOPY Bilateral 03/10/2017   Procedure: VIDEO BRONCHOSCOPY WITH FLUORO;  Surgeon: Oretha MilchAlva, Rakesh V, MD;  [facility];  Service: Cardiopulmonary;  Laterality: Bilateral;        Home Medications    Prior to Admission medications   Medication Sig Start Date End Date Taking? Authorizing Provider  albuterol (PROAIR HFA) 108 (90 Base) MCG/ACT inhaler Inhale 2 puffs into the  lungs every 6 (six) hours as needed for wheezing or shortness of breath.   Yes [provider]  albuterol (PROVENTIL) (2.5 MG/3ML) 0.083% nebulizer solution Take 3 mLs (2.5 mg total) by nebulization every 6 (six) hours as needed for wheezing or shortness of breath. Patient taking differently: Take 2.5 mg by nebulization See admin instructions. Use one vial (2.5 mg) via nebulization two - three times daily for wheezing and shortness of breath 02/25/17  Yes Bevelyn NgoGroce, Sarah F, NP  amLODipine (NORVASC) 5 MG tablet Take 1 tablet (5 mg total) by mouth daily. 07/20/17  Yes Shelva MajesticHunter, Stephen O, MD  ferrous sulfate 325 (65 FE) MG tablet Take 325 mg by mouth at bedtime.    Yes [provider]  Fluticasone-Umeclidin-Vilant (TRELEGY ELLIPTA) 100-62.5-25 MCG/INH AEPB Inhale 1 puff into the lungs daily. Patient taking differently: Inhale 1 puff into the lungs every evening.  03/05/17  Yes Leslye PeerByrum, Robert S, MD  gabapentin (NEURONTIN) 600 MG tablet Take 600 mg by mouth 3 (three) times daily.  07/14/15  Yes [provider]  hydrOXYzine (VISTARIL) 50 MG capsule Take 100 mg by mouth 3 (three) times daily.  10/07/16  Yes [provider]  losartan (COZAAR) 100 MG tablet Take 1 tablet (100 mg total) by mouth daily. 09/05/16  Yes Shelva MajesticHunter, Stephen O, MD  Melatonin 5 MG LOZG Place 5 mg under the tongue at bedtime.   Yes [provider]  naproxen sodium (ALEVE) 220 MG tablet Take 440 mg by mouth daily as needed (pain).   Yes [provider]  PRESCRIPTION MEDICATION Inhale into the lungs at bedtime. CPAP   Yes [provider]  traZODone (DESYREL) 100 MG tablet Take 50-100 mg by mouth at bedtime.    Yes [provider]  TRINTELLIX 5 MG TABS tablet Take 5 mg by mouth daily. 08/10/17  Yes [provider]    Family History Family History  Problem Relation Age of Onset  . Breast cancer Mother   . Alcohol abuse Father        unclear cause of death. 2552  . Colon  cancer Father   . Alcohol abuse Sister        substance as well  . Alcohol abuse Brother        substance abuse  . Alcohol abuse Sister        substance abuse    Social History Social History   Tobacco Use  . Smoking status: Former Smoker    Packs/day: 1.00    Years: 20.00    Pack years: 20.00    Types: Cigarettes    Last attempt to quit: 02/16/1993    Years since quitting: 24.5  . Smokeless tobacco: Never Used  Substance Use Topics  . Alcohol use: No    Comment: none at all  . Drug use: No     Allergies   Omeprazole   Review of Systems Review of Systems  Constitutional: Negative for chills and fever.  Eyes: Negative for visual disturbance.  Respiratory: Negative for shortness of breath.  Cardiovascular: Negative for chest pain.  Gastrointestinal: Negative for abdominal pain, nausea and vomiting.  Neurological: Negative for headaches.  Psychiatric/Behavioral: Positive for dysphoric mood, sleep disturbance and suicidal ideas. Negative for agitation, hallucinations and self-injury. The patient is nervous/anxious.   All other systems reviewed and are negative.    Physical Exam Updated Vital Signs BP (!) 145/101 (BP Location: Left Arm)   Pulse 86   Temp 97.9 F (36.6 C) (Oral)   Resp 17   Ht 5\' 10"  (1.778 m)   Wt 116.6 kg (257 lb)   SpO2 97%   BMI 36.88 kg/m   Physical Exam  Constitutional: He appears well-developed and well-nourished.  HENT:  Head: Normocephalic and atraumatic.  Mouth/Throat: Oropharynx is clear and moist.  Eyes: Pupils are equal, round, and reactive to light. Conjunctivae and EOM are normal.  Neck: Normal range of motion. Neck supple.  Cardiovascular: Normal rate, regular rhythm, S1 normal and S2 normal.  No murmur heard. Pulmonary/Chest: Effort normal and breath sounds normal. He has no wheezes. He has no rales.  Abdominal: Soft. He exhibits no distension. There is no tenderness. There is no guarding.  Musculoskeletal: Normal range  of motion. He exhibits no edema or deformity.  Lymphadenopathy:    He has no cervical adenopathy.  Neurological: He is alert.  Cranial nerves grossly intact. Patient moves extremities symmetrically and with good coordination. Normal and symmetric gait.  Skin: Skin is warm and dry. No rash noted. No erythema.  Psychiatric:  Alert and oriented x4.  Animated speech, and appearing tearful at times. Appropriate affect, but affect appearing depressed. No flight of ideas.   Nursing note and vitals reviewed.    ED Treatments / Results  Labs (all labs ordered are listed, but only abnormal results are displayed) Labs Reviewed  CBC - Abnormal; Notable for the following components:      Result Value   Hemoglobin 12.2 (*)    HCT 36.4 (*)    All other components within normal limits  COMPREHENSIVE METABOLIC PANEL - Abnormal; Notable for the following components:   Glucose, Bld 102 (*)    Calcium 8.8 (*)    All other components within normal limits  ACETAMINOPHEN LEVEL - Abnormal; Notable for the following components:   Acetaminophen (Tylenol), Serum <10 (*)    All other components within normal limits  ETHANOL  SALICYLATE LEVEL  RAPID URINE DRUG SCREEN, HOSP PERFORMED  I-STAT TROPONIN, ED    EKG EKG Interpretation  Date/Time:  Thursday September 02 2017 19:24:18 EDT Ventricular Rate:  76 PR Interval:  188 QRS Duration: 96 QT Interval:  430 QTC Calculation: 483 R Axis:   27 Text Interpretation:  Normal sinus rhythm Minimal voltage criteria for LVH, may be normal variant Septal infarct , age undetermined Abnormal ECG No significant change since last tracing Confirmed by Jacalyn Lefevre [phone]) on 09/02/2017 8:02:52 PM   Radiology No results found.  Procedures Procedures (including critical care time)  Medications Ordered in ED Medications  albuterol (PROVENTIL HFA;VENTOLIN HFA) 108 (90 Base) MCG/ACT inhaler 2 puff (has no administration in time range)  amLODipine (NORVASC) tablet  5 mg (has no administration in time range)  ferrous sulfate tablet 325 mg (has no administration in time range)  Fluticasone-Umeclidin-Vilant 100-62.5-25 MCG/INH AEPB 1 puff (has no administration in time range)  gabapentin (NEURONTIN) tablet 600 mg (has no administration in time range)  hydrOXYzine (VISTARIL) capsule 100 mg (has no administration in time range)  losartan (COZAAR) tablet 100 mg (has no administration  in time range)  Melatonin LOZG 5 mg (has no administration in time range)  traZODone (DESYREL) tablet 50-100 mg (has no administration in time range)  vortioxetine HBr (TRINTELLIX) tablet 5 mg (has no administration in time range)     Initial Impression / Assessment and Plan / ED Course  I have reviewed the triage vital signs and the nursing notes.  Pertinent labs & imaging results that were available during my care of the patient were reviewed by me and considered in my medical decision making (see chart for details).  Clinical Course as of Sep 03 2334  Thu Sep 02, 2017  2048 Patient may speak with TTS at this time.   [AM]  2149 Labs were baseline.  Patient medically cleared.   [AM]    Clinical Course User Index [AM] Elisha Ponder, PA-C    Patient nontoxic-appearing, neurologically intact, with normal thought content and judgment but does appear to be acutely distressed about his life situation.  He is actively expressing suicidal ideations with the plan.  He presented here voluntarily, driving himself here instead of acting on his urges.  I feel that patient is acutely at risk to harm himself, and should he try to leave, he will require IVC.  Labs at baseline.  Patient is having no active chest pain, do not feel that further cardiac work-up necessary at this time.  Patient was already assessed by [facility], and sent to the emergency department for medical clearance.  Once is completed, TTS looking for placement.   Final Clinical Impressions(s) / ED Diagnoses   Final  diagnoses:  Suicidal ideation  Elevated blood pressure reading with diagnosis of hypertension    ED Discharge Orders    None       Delia Chimes 09/02/17 2338    Jacalyn Lefevre, MD 09/02/17 2338

## 2017-09-02 NOTE — ED Notes (Signed)
Pt A&O x 3, sad and depressed,  SI with plan to jump off bridge.  Monitoring for safety, Q hourly rounding.  Sitter at bedside.
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
## 2017-09-03 ENCOUNTER — Encounter (HOSPITAL_COMMUNITY): Payer: Self-pay | Admitting: *Deleted

## 2017-09-03 ENCOUNTER — Other Ambulatory Visit: Payer: Self-pay

## 2017-09-03 ENCOUNTER — Emergency Department (HOSPITAL_COMMUNITY): Payer: BLUE CROSS/BLUE SHIELD

## 2017-09-03 ENCOUNTER — Inpatient Hospital Stay (HOSPITAL_COMMUNITY)
Admission: AD | Admit: 2017-09-03 | Discharge: 2017-09-09 | DRG: 885 | Disposition: A | Discharge status: Other Acute Inpatient Hospital | Payer: BLUE CROSS/BLUE SHIELD | Source: Intra-hospital | Attending: Psychiatry | Admitting: Psychiatry

## 2017-09-03 DIAGNOSIS — R45851 Suicidal ideations: Secondary | ICD-10-CM | POA: Diagnosis present

## 2017-09-03 DIAGNOSIS — Z7951 Long term (current) use of inhaled steroids: Secondary | ICD-10-CM | POA: Diagnosis not present

## 2017-09-03 DIAGNOSIS — F332 Major depressive disorder, recurrent severe without psychotic features: Secondary | ICD-10-CM

## 2017-09-03 DIAGNOSIS — Z8659 Personal history of other mental and behavioral disorders: Secondary | ICD-10-CM | POA: Diagnosis not present

## 2017-09-03 DIAGNOSIS — Z5329 Procedure and treatment not carried out because of patient's decision for other reasons: Secondary | ICD-10-CM | POA: Diagnosis not present

## 2017-09-03 DIAGNOSIS — F411 Generalized anxiety disorder: Secondary | ICD-10-CM | POA: Diagnosis present

## 2017-09-03 DIAGNOSIS — I10 Essential (primary) hypertension: Secondary | ICD-10-CM

## 2017-09-03 DIAGNOSIS — Z888 Allergy status to other drugs, medicaments and biological substances status: Secondary | ICD-10-CM | POA: Diagnosis not present

## 2017-09-03 DIAGNOSIS — J449 Chronic obstructive pulmonary disease, unspecified: Secondary | ICD-10-CM | POA: Diagnosis present

## 2017-09-03 DIAGNOSIS — K219 Gastro-esophageal reflux disease without esophagitis: Secondary | ICD-10-CM | POA: Diagnosis present

## 2017-09-03 DIAGNOSIS — Z79899 Other long term (current) drug therapy: Secondary | ICD-10-CM

## 2017-09-03 DIAGNOSIS — Z915 Personal history of self-harm: Secondary | ICD-10-CM | POA: Diagnosis not present

## 2017-09-03 DIAGNOSIS — Z87891 Personal history of nicotine dependence: Secondary | ICD-10-CM | POA: Diagnosis not present

## 2017-09-03 DIAGNOSIS — G4733 Obstructive sleep apnea (adult) (pediatric): Secondary | ICD-10-CM | POA: Diagnosis present

## 2017-09-03 DIAGNOSIS — Z6379 Other stressful life events affecting family and household: Secondary | ICD-10-CM | POA: Diagnosis not present

## 2017-09-03 DIAGNOSIS — G4700 Insomnia, unspecified: Secondary | ICD-10-CM | POA: Diagnosis present

## 2017-09-03 DIAGNOSIS — F4310 Post-traumatic stress disorder, unspecified: Secondary | ICD-10-CM | POA: Diagnosis present

## 2017-09-03 DIAGNOSIS — Z811 Family history of alcohol abuse and dependence: Secondary | ICD-10-CM | POA: Diagnosis not present

## 2017-09-03 DIAGNOSIS — F410 Panic disorder [episodic paroxysmal anxiety] without agoraphobia: Secondary | ICD-10-CM | POA: Diagnosis present

## 2017-09-03 DIAGNOSIS — Z818 Family history of other mental and behavioral disorders: Secondary | ICD-10-CM

## 2017-09-03 MED ORDER — TRAZODONE HCL 50 MG PO TABS
50.0000 mg | ORAL_TABLET | Freq: Every day | ORAL | Status: DC
  Administered 2017-09-03 – 2017-09-04 (×2): 100 mg via ORAL
  Filled 2017-09-03 (×4): qty 2

## 2017-09-03 MED ORDER — TRAZODONE HCL 50 MG PO TABS
50.0000 mg | ORAL_TABLET | Freq: Every evening | ORAL | Status: DC | PRN
  Administered 2017-09-05: 50 mg via ORAL
  Filled 2017-09-03: qty 1

## 2017-09-03 MED ORDER — ALBUTEROL SULFATE HFA 108 (90 BASE) MCG/ACT IN AERS: 2.0000 | INHALATION_SPRAY | Freq: Four times a day (QID) | RESPIRATORY_TRACT | Status: DC | PRN

## 2017-09-03 MED ORDER — MAGNESIUM HYDROXIDE 400 MG/5ML PO SUSP: 30.0000 mL | Freq: Every day | ORAL | Status: DC | PRN

## 2017-09-03 MED ORDER — ACETAMINOPHEN 325 MG PO TABS: 650.0000 mg | ORAL_TABLET | Freq: Four times a day (QID) | ORAL | Status: DC | PRN

## 2017-09-03 MED ORDER — FLUTICASONE FUROATE-VILANTEROL 100-25 MCG/INH IN AEPB
1.0000 | INHALATION_SPRAY | Freq: Every day | RESPIRATORY_TRACT | Status: DC
  Administered 2017-09-04 – 2017-09-09 (×6): 1 via RESPIRATORY_TRACT
  Filled 2017-09-03: qty 28

## 2017-09-03 MED ORDER — GABAPENTIN 300 MG PO CAPS
600.0000 mg | ORAL_CAPSULE | Freq: Three times a day (TID) | ORAL | Status: DC
  Administered 2017-09-03 – 2017-09-09 (×19): 600 mg via ORAL
  Filled 2017-09-03 (×24): qty 2

## 2017-09-03 MED ORDER — HYDROXYZINE HCL 50 MG PO TABS
100.0000 mg | ORAL_TABLET | Freq: Once | ORAL | Status: AC
  Administered 2017-09-03: 100 mg via ORAL
  Filled 2017-09-03 (×2): qty 2

## 2017-09-03 MED ORDER — VORTIOXETINE HBR 5 MG PO TABS
5.0000 mg | ORAL_TABLET | Freq: Every day | ORAL | Status: DC
  Filled 2017-09-03 (×2): qty 1

## 2017-09-03 MED ORDER — UMECLIDINIUM BROMIDE 62.5 MCG/INH IN AEPB
1.0000 | INHALATION_SPRAY | Freq: Every day | RESPIRATORY_TRACT | Status: DC
  Administered 2017-09-07 – 2017-09-09 (×3): 1 via RESPIRATORY_TRACT
  Filled 2017-09-03 (×2): qty 7

## 2017-09-03 MED ORDER — HYDROXYZINE HCL 25 MG PO TABS
25.0000 mg | ORAL_TABLET | Freq: Three times a day (TID) | ORAL | Status: DC | PRN
  Administered 2017-09-05 – 2017-09-09 (×4): 25 mg via ORAL
  Filled 2017-09-03 (×4): qty 1

## 2017-09-03 MED ORDER — LOSARTAN POTASSIUM 50 MG PO TABS
100.0000 mg | ORAL_TABLET | Freq: Every day | ORAL | Status: DC
  Administered 2017-09-04 – 2017-09-09 (×6): 100 mg via ORAL
  Filled 2017-09-03 (×9): qty 2

## 2017-09-03 MED ORDER — GABAPENTIN 300 MG PO CAPS
600.0000 mg | ORAL_CAPSULE | Freq: Once | ORAL | Status: AC
  Administered 2017-09-03: 600 mg via ORAL
  Filled 2017-09-03: qty 2

## 2017-09-03 MED ORDER — GABAPENTIN 300 MG PO CAPS
600.0000 mg | ORAL_CAPSULE | Freq: Three times a day (TID) | ORAL | Status: DC
  Filled 2017-09-03 (×5): qty 2

## 2017-09-03 MED ORDER — ALUM & MAG HYDROXIDE-SIMETH 200-200-20 MG/5ML PO SUSP: 30.0000 mL | ORAL | Status: DC | PRN

## 2017-09-03 MED ORDER — AMLODIPINE BESYLATE 5 MG PO TABS
5.0000 mg | ORAL_TABLET | Freq: Every day | ORAL | Status: DC
  Administered 2017-09-04 – 2017-09-06 (×3): 5 mg via ORAL
  Filled 2017-09-03 (×4): qty 1

## 2017-09-03 MED ORDER — MELATONIN 5 MG PO TABS
5.0000 mg | ORAL_TABLET | Freq: Every day | ORAL | Status: DC
  Administered 2017-09-03 – 2017-09-08 (×6): 5 mg via ORAL
  Filled 2017-09-03 (×8): qty 1

## 2017-09-03 MED ORDER — FERROUS SULFATE 325 (65 FE) MG PO TABS
325.0000 mg | ORAL_TABLET | Freq: Every day | ORAL | Status: DC
  Administered 2017-09-03 – 2017-09-08 (×6): 325 mg via ORAL
  Filled 2017-09-03 (×8): qty 1

## 2017-09-03 MED ORDER — VORTIOXETINE HBR 10 MG PO TABS
10.0000 mg | ORAL_TABLET | Freq: Every day | ORAL | Status: DC
  Administered 2017-09-03 – 2017-09-08 (×6): 10 mg via ORAL
  Filled 2017-09-03 (×8): qty 1

## 2017-09-03 MED ORDER — VORTIOXETINE HBR 10 MG PO TABS
10.0000 mg | ORAL_TABLET | Freq: Every day | ORAL | Status: DC
  Filled 2017-09-03: qty 1

## 2017-09-03 MED ORDER — HYDROXYZINE HCL 50 MG PO TABS
100.0000 mg | ORAL_TABLET | Freq: Three times a day (TID) | ORAL | Status: DC
  Administered 2017-09-03 – 2017-09-08 (×14): 100 mg via ORAL
  Filled 2017-09-03 (×20): qty 2

## 2017-09-03 NOTE — ED Notes (Signed)
[facility] [place] [facility]. [facility] called for transport.

## 2017-09-03 NOTE — Progress Notes (Signed)
Patient denied SI and HI, contracts for safety.   Denied A/V hallucinations.  Denied pain.  Patient stated he needs to rest, has felt very stressed because of family matters.  Patient has been sitting in dayroom sleeping.  Respirations even and unlabored.  No signs/symptoms of pain/distress noted on patient's face/body movements.  Safety maintained with 15 minute checks.
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
## 2017-09-03 NOTE — Plan of Care (Signed)
D: Pt denies SI/HI/AV. Pt is pleasant and cooperative. Pt visibly sad about his situation. Pt stated he was depressed and sad. Pt was given 600 mg of Neurontin and 100 mg Vistaril per PA- Spencer due to pt not having his 3rd dose for the day. Pt stated he would like to have his Neurontin scheduled for the night time due to him taking his 3rd dose at night with his Trazodone.  A: Pt was offered support and encouragement. Pt was given scheduled medications. Pt was encourage to attend groups. Q 15 minute checks were done for safety.  R:Pt attends groups and interacts well with peers and staff. Pt is taking medication. Pt has no complaints.Pt receptive to treatment and safety maintained on unit.   Problem: Education: Goal: Emotional status will improve Outcome: Progressing   Problem: Education: Goal: Mental status will improve Outcome: Progressing   Problem: Coping: Goal: Ability to verbalize frustrations and anger appropriately will improve Outcome: Progressing

## 2017-09-03 NOTE — Progress Notes (Signed)
Zachary RuizJohn is a 62 year old male pt admitted on voluntary basis. On admission he does endorse passive SI and spoke about how he just wants to die however he is able to contract for safety in the hospital. He reports on-going conflict at home with his family as a main stressor currently. He reports that he has been in ECT before and wants to go back as he reports that helped him. He reports that he is taking all his medications as prescribed. He denies any current substance abuse issues. He reports that he lives at home with his wife and reports that he will return there once he is discharged. Khaleem was admitted to the unit and safety maintained.

## 2017-09-03 NOTE — BH Assessment (Signed)
[facility]  Per Juanetta BeetsJacqueline Norman, DO, this pt requires psychiatric hospitalization at this time.  Malva LimesLinsey Strader, RN, [id number].  Pt has signed Voluntary Admission and Consent for Treatment, as well as Consent to Release Information to Leanord Hawkingwayne Book, MD, and a notification call has been placed.  Signed forms have been fax[facility] to [facility].  Pt's nurse, Diane, has been notified, and agrees to send original paperwork along with pt via [facility], and to call report to [phone].  Doylene Canninghomas Princetta Uplinger, [place] Behavioral Health Coordinator [phone]

## 2017-09-03 NOTE — Consult Note (Addendum)
[place] Psychiat[facility] Consult   Reason for Consult:  Suicidal ideation  Referring Physician:  EDP Patient Identification: [name] MRN:  [id number] Principal Diagnosis: MDD (major depressive disorder), recurrent severe, without psychosis ([place]) Diagnosis:   Patient Active Problem List   Diagnosis Date Noted  . Pneumonia [J18.9] 03/08/2017  . COPD with acute exacerbation ([place]) [J44.1] 01/19/2017  . OSA (obstructive sleep apnea) [G47.33] 01/11/2017  . Rash [R21] 01/11/2017  . Hyponatremia [E87.1] 10/13/2016  . Hypertension [I10] 07/09/2016  . History of adenomatous polyp of colon [Z86.010] 07/09/2016  . 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 Obesity, Class II, BMI 35-39.9 [E66.9] 07/09/2016  . Edema [R60.9] 07/09/2016  . COPD (chronic obstructive pulmonary disease) ([place]) [J44.9] 02/27/2015  . Dyspnea on exertion [R06.09] 01/18/2015  . Lung mass [R91.8] 10/10/2013  . Chronic maxillary sinusitis [J32.0] 09/27/2013  . GERD (gastroesophageal reflux disease) [K21.9] 09/27/2013  . Chronic cough [R05] 06/21/2013  . Alcohol dependence ([place]) [F10.20] 07/29/2011  . Bipolar affective disorder, depressed, moderate ([place]) [F31.32] 07/29/2011    Class: Chronic    Total Time spent with patient: 45 minutes  Subjective:   [name] is a 62 y.o. male patient admitted with severe depression with suicidal ideation.  HPI:  Pt was seen and chart reviewed with treatment team and Dr Mariea Clonts. Pt denies homicidal ideation, denies auditory/visual hallucinations and does not appear to be responding to internal stimuli. Pt stated he became so depressed yesterday that he got in his car and did not know where he was going to go but he knew he wanted to hurt himself so instead he came to the hospital. He said that he has had depression for most of his life and has had ECT and [place] in the past but stated that he is not pursuing ECT at this time as he understands the ECT will not work as long as he is in his  current living situation.  He did have [facility] last fall and is not pursuing any more TMS at this time.  Pt stated he feels overwhelmed aobut his home situation and is talked down to and made to feel like he doesn't matter. Pt stated his depression has been escalating over the past 3 weeks and currently it is unbearable. Pt's EKG reviewed and QTc is 483, CXR and UA ordered during this admission. CXR clear, UA still pending. Pt's BAL is negative. Pt appears sad and tearful. Pt lives with his wife of 21 years, a daughter and her fiance', his two children and their baby. Pt is watching the baby all day while his daughter works and he is very overwhelmed by this. Pt stated "I know I need to get out of this living situation but I have to sell some property in order to leave. Right now I don't have the energy to even think about this." Pt is able to perform all ADLs without assistance and uses no assistive devices for ambulation. Pt would benefit form an inpatient psychiatric admission for crisis stabilization and medication management.   Past Psychiatric History: As above  Risk to Self:  Yes Risk to Others:  None Prior Inpatient Therapy:  No Prior Outpatient Therapy:  Yes  Past Medical History:  Past Medical History:  Diagnosis Date  . ADD (attention deficit disorder)   . Anxiety    Panic  . Bipolar disorder ([place])   . GERD (gastroesophageal reflux disease)    ? they thought GERD caused cough.   . Mental disorder  ADD, bipolar  . Pneumonia 04/12/2013  . Shortness of breath   . Substance abuse [facility])     Past Surgical History:  Procedure Laterality Date  . APPENDECTOMY  1988  . COLONOSCOPY    . TONSILLECTOMY    . VIDEO BRONCHOSCOPY Bilateral 10/10/2013   Procedure: VIDEO BRONCHOSCOPY WITH FLUORO;  Surgeon: Rigoberto Noel, MD;  Location: [place];  Service: Cardiopulmonary;  Laterality: Bilateral;  . VIDEO BRONCHOSCOPY N/A 10/13/2013   Procedure: VIDEO BRONCHOSCOPY, REMOVAL OF ENDOBRONCHIAL  LESION, PROBABLE FOREIGN BODY;  Surgeon: Grace Isaac, MD;  Location: [place];  Service: Thoracic;  Laterality: N/A;  . VIDEO BRONCHOSCOPY N/A 11/28/2013   Procedure: VIDEO BRONCHOSCOPY;  Surgeon: Grace Isaac, MD;  Location: [place];  Service: Thoracic;  Laterality: N/A;  bronchoscopy for removal of foreign body  . VIDEO BRONCHOSCOPY Bilateral 03/10/2017   Procedure: VIDEO BRONCHOSCOPY WITH FLUORO;  Surgeon: Rigoberto Noel, MD;  Location: [place];  Service: Cardiopulmonary;  Laterality: Bilateral;   Family History:  Family History  Problem Relation Age of Onset  . Breast cancer Mother   . Alcohol abuse Father        unclear cause of death. 62  . Colon cancer Father   . Alcohol abuse Sister        substance as well  . Alcohol abuse Brother        substance abuse  . Alcohol abuse Sister        substance abuse   Family Psychiatric  History: As stated above. Social History:  Social History   Substance and Sexual Activity  Alcohol Use No   Comment: none at all     Social History   Substance and Sexual Activity  Drug Use No    Social History   Socioeconomic History  . Marital status: Married    Spouse name: Not on file  . Number of children: Not on file  . Years of education: Not on file  . Highest education level: Not on file  Occupational History  . Not on file  Social Needs  . Financial resource strain: Not on file  . Food insecurity:    Worry: Not on file    Inability: Not on file  . Transportation needs:    Medical: Not on file    Non-medical: Not on file  Tobacco Use  . Smoking status: Former Smoker    Packs/day: 1.00    Years: 20.00    Pack years: 20.00    Types: Cigarettes    Last attempt to quit: 02/16/1993    Years since quitting: 24.5  . Smokeless tobacco: Never Used  Substance and Sexual Activity  . Alcohol use: No    Comment: none at all  . Drug use: No  . Sexual activity: Yes  Lifestyle  . Physical activity:    Days per week: Not on  file    Minutes per session: Not on file  . Stress: Not on file  Relationships  . Social connections:    Talks on phone: Not on file    Gets together: Not on file    Attends religious service: Not on file    Active member of club or organization: Not on file    Attends meetings of clubs or organizations: Not on file    Relationship status: Not on file  Other Topics Concern  . Not on file  Social History Narrative   Married. Lives with wife. 2 children. Soon to be grandfather  07/2016.       Freelance work/semi retired. Editing/writing.       Hobbies: enjoys Estate agent poetry   Additional Social History: N/A    Allergies:   Allergies  Allergen Reactions  . Omeprazole Diarrhea and Nausea And Vomiting    Labs:  Results for orders placed or performed during the hospital encounter of 09/02/17 (from the past 48 hour(s))  CBC     Status: Abnormal   Collection Time: 09/02/17  8:36 PM  Result Value Ref Range   WBC 8.0 4.0 - 10.5 K/uL   RBC 4.38 4.22 - 5.81 MIL/uL   Hemoglobin 12.2 (L) 13.0 - 17.0 g/dL   HCT 36.4 (L) 39.0 - 52.0 %   MCV 83.1 78.0 - 100.0 fL   MCH 27.9 26.0 - 34.0 pg   MCHC 33.5 30.0 - 36.0 g/dL   RDW 13.7 11.5 - 15.5 %   Platelets 255 150 - 400 K/uL    Comment: Performed at [facility], [place] [address], [place], [place] [address]  Comprehensive metabolic panel     Status: Abnormal   Collection Time: 09/02/17  8:36 PM  Result Value Ref Range   Sodium 138 135 - 145 mmol/L   Potassium 4.3 3.5 - 5.1 mmol/L   Chloride 102 98 - 111 mmol/L    Comment: Please note change in reference range.   CO2 26 22 - 32 mmol/L   Glucose, Bld 102 (H) 70 - 99 mg/dL    Comment: Please note change in reference range.   BUN 15 8 - 23 mg/dL    Comment: Please note change in reference range.   Creatinine, Ser 0.93 0.61 - 1.24 mg/dL   Calcium 8.8 (L) 8.9 - 10.3 mg/dL   Total Protein 6.7 6.5 - 8.1 g/dL   Albumin 4.1 3.5 - 5.0 g/dL   AST 23 15 - 41 U/L   ALT 26 0 -  44 U/L    Comment: Please note change in reference range.   Alkaline Phosphatase 77 38 - 126 U/L   Total Bilirubin 0.9 0.3 - 1.2 mg/dL   GFR calc non Af Amer >60 >60 mL/min   GFR calc Af Amer >60 >60 mL/min    Comment: (NOTE) The eGFR has been calculated using the CKD EPI equation. This calculation has not been validated in all clinical situations. eGFR's persistently <60 mL/min signify possible Chronic Kidney Disease.    Anion gap 10 5 - 15    Comment: Performed at [facility], [place] [address] [place], [place] [address]  Acetaminophen level     Status: Abnormal   Collection Time: 09/02/17  8:36 PM  Result Value Ref Range   Acetaminophen (Tylenol), Serum <10 (L) 10 - 30 ug/mL    Comment: (NOTE) Therapeutic concentrations vary significantly. A range of 10-30 ug/mL  may be an effective concentration for many patients. However, some  are best treated at concentrations outside of this range. Acetaminophen concentrations >150 ug/mL at 4 hours after ingestion  and >50 ug/mL at 12 hours after ingestion are often associated with  toxic reactions. Performed at [facility], [place] [address] [place], [place] [address]   Ethanol     Status: None   Collection Time: 09/02/17  8:36 PM  Result Value Ref Range   Alcohol, Ethyl (B) <10 <10 mg/dL    Comment: (NOTE) Lowest detectable limit for serum alcohol is 10 mg/dL. For medical purposes only. Performed at [facility], [place] [address] [place], [place] [address]   Salicylate level     Status: None   Collection Time: 09/02/17  8:36 PM  Result Value Ref Range   Salicylate Lvl <3.2 2.8 - 30.0 mg/dL    Comment: Performed at [facility], [place] [address], [place], [place] [address]  I-Stat Troponin, ED (not at [facility])     Status: None   Collection Time: 09/02/17  8:39 PM  Result Value Ref Range   Troponin i, poc 0.00 0.00 - 0.08 ng/mL   Comment 3            Comment:  Due to the release kinetics of cTnI, a negative result within the first hours of the onset of symptoms does not rule out myocardial infarction with certainty. If myocardial infarction is still suspected, repeat the test at appropriate intervals.     Current Facility-Administered Medications  Medication Dose Route Frequency Provider Last Rate Last Dose  . acetaminophen (TYLENOL) tablet 650 mg  650 mg Oral Q4H PRN Murray, Alyssa B, PA-C      . albuterol (PROVENTIL HFA;VENTOLIN HFA) 108 (90 Base) MCG/ACT inhaler 2 puff  2 puff Inhalation Q6H PRN Valere Dross, Alyssa B, PA-C      . amLODipine (NORVASC) tablet 5 mg  5 mg Oral Daily Murray, Alyssa B, PA-C   5 mg at 09/03/17 0946  . ferrous sulfate tablet 325 mg  325 mg Oral QHS Langston Masker B, PA-C   325 mg at 09/02/17 2133  . fluticasone furoate-vilanterol (BREO ELLIPTA) 100-25 MCG/INH 1 puff  1 puff Inhalation Daily Isla Pence, MD       And  . umeclidinium bromide (INCRUSE ELLIPTA) 62.5 MCG/INH 1 puff  1 puff Inhalation Daily Isla Pence, MD      . gabapentin (NEURONTIN) capsule 600 mg  600 mg Oral TID Langston Masker B, PA-C   600 mg at 09/03/17 0944  . hydrOXYzine (ATARAX/VISTARIL) tablet 100 mg  100 mg Oral TID Isla Pence, MD   100 mg at 09/03/17 0947  . losartan (COZAAR) tablet 100 mg  100 mg Oral Daily Murray, Alyssa B, PA-C   100 mg at 09/03/17 0946  . Melatonin TABS 5 mg  5 mg Oral QHS Isla Pence, MD   5 mg at 09/02/17 2152  . traZODone (DESYREL) tablet 50-100 mg  50-100 mg Oral QHS Murray, Alyssa B, PA-C   100 mg at 09/02/17 2132  . vortioxetine HBr (TRINTELLIX) tablet 5 mg  5 mg Oral Daily Murray, Alyssa B, PA-C   5 mg at 09/03/17 2500   Current Outpatient Medications  Medication Sig Dispense Refill  . albuterol (PROAIR HFA) 108 (90 Base) MCG/ACT inhaler Inhale 2 puffs into the lungs every 6 (six) hours as needed for wheezing or shortness of breath.    Marland Kitchen albuterol (PROVENTIL) (2.5 MG/3ML) 0.083% nebulizer solution Take  3 mLs (2.5 mg total) by nebulization every 6 (six) hours as needed for wheezing or shortness of breath. (Patient taking differently: Take 2.5 mg by nebulization See admin instructions. Use one vial (2.5 mg) via nebulization two - three times daily for wheezing and shortness of breath) 75 mL 12  . amLODipine (NORVASC) 5 MG tablet Take 1 tablet (5 mg total) by mouth daily. 30 tablet 5  . ferrous sulfate 325 (65 FE) MG tablet Take 325 mg by mouth at bedtime.     . Fluticasone-Umeclidin-Vilant (TRELEGY ELLIPTA) 100-62.5-25 MCG/INH AEPB Inhale 1 puff into the lungs daily. (Patient taking  differently: Inhale 1 puff into the lungs every evening. ) 2 each 0  . gabapentin (NEURONTIN) 600 MG tablet Take 600 mg by mouth 3 (three) times daily.   2  . hydrOXYzine (VISTARIL) 50 MG capsule Take 100 mg by mouth 3 (three) times daily.   2  . losartan (COZAAR) 100 MG tablet Take 1 tablet (100 mg total) by mouth daily. 90 tablet 3  . Melatonin 5 MG LOZG Place 5 mg under the tongue at bedtime.    . naproxen sodium (ALEVE) 220 MG tablet Take 440 mg by mouth daily as needed (pain).    Marland Kitchen PRESCRIPTION MEDICATION Inhale into the lungs at bedtime. CPAP    . traZODone (DESYREL) 100 MG tablet Take 50-100 mg by mouth at bedtime.     . TRINTELLIX 5 MG TABS tablet Take 5 mg by mouth daily.  0    Musculoskeletal: Strength & Muscle Tone: within normal limits Gait & Station: normal Patient leans: N/A  Psychiatric Specialty Exam: Physical Exam  Nursing note and vitals reviewed. Constitutional: He is oriented to person, place, and time. He appears well-developed and well-nourished.  HENT:  Head: Normocephalic and atraumatic.  Neck: Normal range of motion.  Respiratory: Effort normal.  Musculoskeletal: Normal range of motion.  Neurological: He is alert and oriented to person, place, and time.  Psychiatric: His speech is normal and behavior is normal. Thought content normal. His mood appears anxious. Cognition and memory are  normal. He expresses impulsivity. He exhibits a depressed mood.    Review of Systems  Psychiatric/Behavioral: Positive for depression and suicidal ideas. Negative for hallucinations, memory loss and substance abuse. The patient is nervous/anxious. The patient does not have insomnia.   All other systems reviewed and are negative.   Blood pressure (!) 150/99, pulse 63, temperature (!) 97.3 F (36.3 C), temperature source Oral, resp. rate 20, height _0  (1.778 m), weight 257 lb (116.6 kg), SpO2 95 %.Body mass index is 36.88 kg/m.  General Appearance: Casual  Eye Contact:  Good  Speech:  Clear and Coherent and Normal Rate  Volume:  Normal  Mood:  Anxious, Depressed and Dysphoric  Affect:  Congruent, Depressed and Flat  Thought Process:  Coherent, Goal Directed, Linear and Descriptions of Associations: Intact  Orientation:  Full (Time, Place, and Person)  Thought Content:  Logical  Suicidal Thoughts:  Yes.  with intent/plan  Homicidal Thoughts:  No  Memory:  Immediate;   Good Recent;   Good Remote;   Fair  Judgement:  Fair  Insight:  Fair  Psychomotor Activity:  Normal  Concentration:  Concentration: Good and Attention Span: Good  Recall:  Good  Fund of Knowledge:  Good  Language:  Good  Akathisia:  No  Handed:  Right  AIMS (if indicated):     Assets:  Communication Skills Desire for Improvement Financial Resources/Insurance Housing Social Support  ADL's:  Intact  Cognition:  WNL  Sleep:   N/A     Treatment Plan Summary: Daily contact with patient to assess and evaluate symptoms and progress in treatment and Medication management  -Crisis stabilization Medications: -Trintellix 5 mg daily for mood control -Gabapentin 600 mg three times daily for anxiety -Melatonin 5 mg at bedtime for sleep -Trazodone 50-100 mg at bedtime for sleep  Disposition: Recommend psychiatric Inpatient admission when medically cleared. TTS to seek placement  Ethelene Hal,  NP 09/03/2017 10:51 AM   Patient seen face-to-face for psychiatric evaluation, chart reviewed and case discussed with the physician  extender and developed treatment plan. Reviewed the information documented and agree with the treatment plan.  Buford Dresser, DO 09/03/17 12:43 PM
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
## 2017-09-03 NOTE — ED Notes (Signed)
Transported to [facility] by [facility] [employer]. All belongings returned to pt. Pt was calm and cooperative.  

## 2017-09-03 NOTE — Tx Team (Signed)
Initial Treatment Plan 09/03/2017 2:44 PM Michaelyn BarterJohn B Currin ZOX:[id number]    PATIENT STRESSORS: Financial difficulties Health problems Marital or family conflict   PATIENT STRENGTHS: Ability for insight Average or above average intelligence Capable of independent living Communication skills General fund of knowledge   PATIENT IDENTIFIED PROBLEMS: Depression Suicidal thoughts "I've had so much treatment for depression and it just keeps coming back"                     DISCHARGE CRITERIA:  Ability to meet basic life and health needs Improved stabilization in mood, thinking, and/or behavior Reduction of life-threatening or endangering symptoms to within safe limits Verbal commitment to aftercare and medication compliance  PRELIMINARY DISCHARGE PLAN: Attend aftercare/continuing care group Return to previous living arrangement  PATIENT/FAMILY INVOLVEMENT: This treatment plan has been presented to and reviewed with the patient, Michaelyn BarterJohn B Mooers, and/or family member, .  The patient and family have been given the opportunity to ask questions and make suggestions.  Gwyndolyn Guil[place], Marvin[place]ok Wayne, [place]RN 09/03/2017, 2:44 PM

## 2017-09-04 MED ORDER — OXCARBAZEPINE 150 MG PO TABS
75.0000 mg | ORAL_TABLET | Freq: Two times a day (BID) | ORAL | Status: DC
  Administered 2017-09-04 – 2017-09-05 (×2): 75 mg via ORAL
  Filled 2017-09-04 (×6): qty 0.5

## 2017-09-04 NOTE — Progress Notes (Signed)
[facility] MD Progress Note  09/04/2017 10:58 AM [name]  MRN:  [id number] Subjective: Patient is seen and examined.  Patient is a 62 year old male with a past psychiatric history significant for major depression as well as posttraumatic stress disorder.  He is seen in follow-up.  He is essentially unchanged from yesterday.  He stated he feels like the groups are a waste, and that life is "shit", and that nothing will improve.  He is very negative, and does not feel as though any of the groups are beneficial.  His Trintellix was increased to 10 mg p.o. daily yesterday.  He stated he feels like his anxiety is overwhelming, and he understands that "I know you cannot give me anything because of my substance history".  He admitted to suicidal ideation. Principal Problem: <principal problem not specified> Diagnosis:   Patient Active Problem List   Diagnosis Date Noted  . Severe recurrent major depression without psychotic features ([place]) [F33.2]   . Posttraumatic stress disorder [F43.10]   . Pneumonia [J18.9] 03/08/2017  . COPD with acute exacerbation ([place]) [J44.1] 01/19/2017  . OSA (obstructive sleep apnea) [G47.33] 01/11/2017  . Rash [R21] 01/11/2017  . Hyponatremia [E87.1] 10/13/2016  . Hypertension [I10] 07/09/2016  . History of adenomatous polyp of colon [Z86.010] 07/09/2016  . 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 Obesity, Class II, BMI 35-39.9 [E66.9] 07/09/2016  . Edema [R60.9] 07/09/2016  . COPD (chronic obstructive pulmonary disease) ([place]) [J44.9] 02/27/2015  . Dyspnea on exertion [R06.09] 01/18/2015  . Lung mass [R91.8] 10/10/2013  . Chronic maxillary sinusitis [J32.0] 09/27/2013  . GERD (gastroesophageal reflux disease) [K21.9] 09/27/2013  . Chronic cough [R05] 06/21/2013  . Alcohol dependence ([place]) [F10.20] 07/29/2011  . Bipolar affective disorder, depressed, moderate ([place]) [F31.32] 07/29/2011    Class: Chronic   Total Time spent with patient: 20 minutes  Past Psychiatric  History: See admission H&P  Past Medical History:  Past Medical History:  Diagnosis Date  . ADD (attention deficit disorder)   . Anxiety    Panic  . Bipolar disorder ([place])   . GERD (gastroesophageal reflux disease)    ? they thought GERD caused cough.   . Mental disorder    ADD, bipolar  . Pneumonia 04/12/2013  . Shortness of breath   . Substance abuse [facility])     Past Surgical History:  Procedure Laterality Date  . APPENDECTOMY  1988  . COLONOSCOPY    . TONSILLECTOMY    . VIDEO BRONCHOSCOPY Bilateral 10/10/2013   Procedure: VIDEO BRONCHOSCOPY WITH FLUORO;  Surgeon: Rigoberto Noel, MD;  Location: [place];  Service: Cardiopulmonary;  Laterality: Bilateral;  . VIDEO BRONCHOSCOPY N/A 10/13/2013   Procedure: VIDEO BRONCHOSCOPY, REMOVAL OF ENDOBRONCHIAL LESION, PROBABLE FOREIGN BODY;  Surgeon: Grace Isaac, MD;  Location: [place];  Service: Thoracic;  Laterality: N/A;  . VIDEO BRONCHOSCOPY N/A 11/28/2013   Procedure: VIDEO BRONCHOSCOPY;  Surgeon: Grace Isaac, MD;  Location: [place];  Service: Thoracic;  Laterality: N/A;  bronchoscopy for removal of foreign body  . VIDEO BRONCHOSCOPY Bilateral 03/10/2017   Procedure: VIDEO BRONCHOSCOPY WITH FLUORO;  Surgeon: Rigoberto Noel, MD;  Location: [place];  Service: Cardiopulmonary;  Laterality: Bilateral;   Family History:  Family History  Problem Relation Age of Onset  . Breast cancer Mother   . Alcohol abuse Father        unclear cause of death. 33  . Colon cancer Father   . Alcohol abuse Sister  substance as well  . Alcohol abuse Brother        substance abuse  . Alcohol abuse Sister        substance abuse   Family Psychiatric  History: See admission H&P Social History:  Social History   Substance and Sexual Activity  Alcohol Use No   Comment: none at all     Social History   Substance and Sexual Activity  Drug Use No    Social History   Socioeconomic History  . Marital status: Married    Spouse  name: Not on file  . Number of children: Not on file  . Years of education: Not on file  . Highest education level: Not on file  Occupational History  . Not on file  Social Needs  . Financial resource strain: Not on file  . Food insecurity:    Worry: Not on file    Inability: Not on file  . Transportation needs:    Medical: Not on file    Non-medical: Not on file  Tobacco Use  . Smoking status: Former Smoker    Packs/day: 1.00    Years: 20.00    Pack years: 20.00    Types: Cigarettes    Last attempt to quit: 02/16/1993    Years since quitting: 24.5  . Smokeless tobacco: Never Used  Substance and Sexual Activity  . Alcohol use: No    Comment: none at all  . Drug use: No  . Sexual activity: Yes  Lifestyle  . Physical activity:    Days per week: Not on file    Minutes per session: Not on file  . Stress: Not on file  Relationships  . Social connections:    Talks on phone: Not on file    Gets together: Not on file    Attends religious service: Not on file    Active member of club or organization: Not on file    Attends meetings of clubs or organizations: Not on file    Relationship status: Not on file  Other Topics Concern  . Not on file  Social History Narrative   Married. Lives with wife. 2 children. Soon to be grandfather 07/2016.       Freelance work/semi retired. Editing/writing.       Hobbies: enjoys Estate agent poetry   Additional Social History:                         Sleep: Fair  Appetite:  Fair  Current Medications: Current Facility-Administered Medications  Medication Dose Route Frequency Provider Last Rate Last Dose  . acetaminophen (TYLENOL) tablet 650 mg  650 mg Oral Q6H PRN Ethelene Hal, NP      . albuterol (PROVENTIL HFA;VENTOLIN HFA) 108 (90 Base) MCG/ACT inhaler 2 puff  2 puff Inhalation Q6H PRN Ethelene Hal, NP      . alum & mag hydroxide-simeth (MAALOX/MYLANTA) 200-200-20 MG/5ML suspension 30 mL  30 mL Oral Q4H PRN  Ethelene Hal, NP      . amLODipine (NORVASC) tablet 5 mg  5 mg Oral Daily Ethelene Hal, NP   5 mg at 09/04/17 0813  . ferrous sulfate tablet 325 mg  325 mg Oral QHS Ethelene Hal, NP   325 mg at 09/03/17 2133  . fluticasone furoate-vilanterol (BREO ELLIPTA) 100-25 MCG/INH 1 puff  1 puff Inhalation Daily Ethelene Hal, NP   1 puff at 09/04/17 4098   And  .  umeclidinium bromide (INCRUSE ELLIPTA) 62.5 MCG/INH 1 puff  1 puff Inhalation Daily Ethelene Hal, NP      . gabapentin (NEURONTIN) capsule 600 mg  600 mg Oral TID Sharma Covert, MD   600 mg at 09/04/17 0813  . hydrOXYzine (ATARAX/VISTARIL) tablet 100 mg  100 mg Oral TID Ethelene Hal, NP   100 mg at 09/04/17 0813  . hydrOXYzine (ATARAX/VISTARIL) tablet 25 mg  25 mg Oral TID PRN Ethelene Hal, NP      . losartan (COZAAR) tablet 100 mg  100 mg Oral Daily Ethelene Hal, NP   100 mg at 09/04/17 0813  . magnesium hydroxide (MILK OF MAGNESIA) suspension 30 mL  30 mL Oral Daily PRN Ethelene Hal, NP      . Melatonin TABS 5 mg  5 mg Oral QHS Ethelene Hal, NP   5 mg at 09/03/17 2133  . traZODone (DESYREL) tablet 50 mg  50 mg Oral QHS PRN Ethelene Hal, NP      . traZODone (DESYREL) tablet 50-100 mg  50-100 mg Oral QHS Ethelene Hal, NP   100 mg at 09/03/17 2133  . vortioxetine HBr (TRINTELLIX) tablet 10 mg  10 mg Oral QHS Sharma Covert, MD   10 mg at 09/03/17 2133    Lab Results:  Results for orders placed or performed during the hospital encounter of 09/02/17 (from the past 48 hour(s))  CBC     Status: Abnormal   Collection Time: 09/02/17  8:36 PM  Result Value Ref Range   WBC 8.0 4.0 - 10.5 K/uL   RBC 4.38 4.22 - 5.81 MIL/uL   Hemoglobin 12.2 (L) 13.0 - 17.0 g/dL   HCT 36.4 (L) 39.0 - 52.0 %   MCV 83.1 78.0 - 100.0 fL   MCH 27.9 26.0 - 34.0 pg   MCHC 33.5 30.0 - 36.0 g/dL   RDW 13.7 11.5 - 15.5 %   Platelets 255 150 - 400 K/uL     Comment: Performed at [facility], [place] [address], [place], [place] [address]  Comprehensive metabolic panel     Status: Abnormal   Collection Time: 09/02/17  8:36 PM  Result Value Ref Range   Sodium 138 135 - 145 mmol/L   Potassium 4.3 3.5 - 5.1 mmol/L   Chloride 102 98 - 111 mmol/L    Comment: Please note change in reference range.   CO2 26 22 - 32 mmol/L   Glucose, Bld 102 (H) 70 - 99 mg/dL    Comment: Please note change in reference range.   BUN 15 8 - 23 mg/dL    Comment: Please note change in reference range.   Creatinine, Ser 0.93 0.61 - 1.24 mg/dL   Calcium 8.8 (L) 8.9 - 10.3 mg/dL   Total Protein 6.7 6.5 - 8.1 g/dL   Albumin 4.1 3.5 - 5.0 g/dL   AST 23 15 - 41 U/L   ALT 26 0 - 44 U/L    Comment: Please note change in reference range.   Alkaline Phosphatase 77 38 - 126 U/L   Total Bilirubin 0.9 0.3 - 1.2 mg/dL   GFR calc non Af Amer >60 >60 mL/min   GFR calc Af Amer >60 >60 mL/min    Comment: (NOTE) The eGFR has been calculated using the CKD EPI equation. This calculation has not been validated in all clinical situations. eGFR's persistently <60 mL/min signify possible Chronic Kidney Disease.    Anion  gap 10 5 - 15    Comment: Performed at [facility], [place] [address] [place], [place] [address]  Acetaminophen level     Status: Abnormal   Collection Time: 09/02/17  8:36 PM  Result Value Ref Range   Acetaminophen (Tylenol), Serum <10 (L) 10 - 30 ug/mL    Comment: (NOTE) Therapeutic concentrations vary significantly. A range of 10-30 ug/mL  may be an effective concentration for many patients. However, some  are best treated at concentrations outside of this range. Acetaminophen concentrations >150 ug/mL at 4 hours after ingestion  and >50 ug/mL at 12 hours after ingestion are often associated with  toxic reactions. Performed at [facility], [place] [address] [place], [place] [address]   Ethanol     Status:  None   Collection Time: 09/02/17  8:36 PM  Result Value Ref Range   Alcohol, Ethyl (B) <10 <10 mg/dL    Comment: (NOTE) Lowest detectable limit for serum alcohol is 10 mg/dL. For medical purposes only. Performed at [facility], [place] [address] [place], [place] [address]   Salicylate level     Status: None   Collection Time: 09/02/17  8:36 PM  Result Value Ref Range   Salicylate Lvl <4.0 2.8 - 30.0 mg/dL    Comment: Performed at [facility], [place] [address] [place],  [address]  I-Stat Troponin, ED (not at [facility])     Status: None   Collection Time: 09/02/17  8:39 PM  Result Value Ref Range   Troponin i, poc 0.00 0.00 - 0.08 ng/mL   Comment 3            Comment: Due to the release kinetics of cTnI, a negative result within the first hours of the onset of symptoms does not rule out myocardial infarction with certainty. If myocardial infarction is still suspected, repeat the test at appropriate intervals.     Blood Alcohol level:  Lab Results  Component Value Date   ETH <10 09/02/2017   ETH <11 19/50/9326    Metabolic Disorder Labs: Lab Results  Component Value Date   HGBA1C 5.3 07/09/2016   No results found for: PROLACTIN Lab Results  Component Value Date   CHOL 137 08/09/2015   TRIG 46 08/09/2015   HDL 44 08/09/2015   LDLCALC 84 08/09/2015    Physical Findings: AIMS: Facial and Oral Movements Muscles of Facial Expression: None, normal Lips and Perioral Area: None, normal Jaw: None, normal Tongue: None, normal,Extremity Movements Upper (arms, wrists, hands, fingers): None, normal Lower (legs, knees, ankles, toes): None, normal, Trunk Movements Neck, shoulders, hips: None, normal, Overall Severity Severity of abnormal movements (highest score from questions above): None, normal Incapacitation due to abnormal movements: None, normal Patient's awareness of abnormal movements (rate only patient's report): No Awareness,  Dental Status Current problems with teeth and/or dentures?: No Does patient usually wear dentures?: No  CIWA:    COWS:     Musculoskeletal: Strength & Muscle Tone: within normal limits Gait & Station: normal Patient leans: N/A  Psychiatric Specialty Exam: Physical Exam  Nursing note and vitals reviewed. Constitutional: He is oriented to person, place, and time. He appears well-developed and well-nourished.  HENT:  Head: Normocephalic and atraumatic.  Respiratory: Effort normal.  GI: Bowel sounds are normal.  Neurological: He is alert and oriented to person, place, and time.    ROS  Blood pressure (!) 132/99, pulse 77, temperature (!) 97.3 F (36.3 C), temperature source  Oral, resp. rate 18, height 5' 10"  (1.778 m), weight 117.9 kg (260 lb).Body mass index is 37.31 kg/m.  General Appearance: Disheveled  Eye Contact:  Fair  Speech:  Normal Rate  Volume:  Normal  Mood:  Anxious, Depressed, Hopeless and Irritable  Affect:  Congruent  Thought Process:  Coherent  Orientation:  Full (Time, Place, and Person)  Thought Content:  Logical  Suicidal Thoughts:  Yes.  without intent/plan  Homicidal Thoughts:  No  Memory:  Immediate;   Fair Recent;   Fair Remote;   Fair  Judgement:  Impaired  Insight:  Lacking  Psychomotor Activity:  Increased  Concentration:  Concentration: Fair and Attention Span: Fair  Recall:  [employer] of Knowledge:  Fair  Language:  Fair  Akathisia:  Negative  Handed:  Right  AIMS (if indicated):     Assets:  Desire for Improvement Resilience  ADL's:  Intact  Cognition:  WNL  Sleep:  Number of Hours: 6.75     Treatment Plan Summary: Daily contact with patient to assess and evaluate symptoms and progress in treatment, Medication management and Plan Patient is seen and examined.  Patient is a 62 year old male with the above-stated past psychiatric history who is seen in follow-up.  He is essentially unchanged from yesterday.  His Trintellix was just  increased yesterday.  He complains of anxiety.  He is already on 600 mg of Neurontin 3 times daily.  He stated that he was unable to tolerate BuSpar.  I will try to give him some low-dose Trileptal today.  We will start at 75 mg p.o. twice daily and see if that decreases any of his agitation.  He is very negative but this morning, and we will just have to work through this with his depression.  His blood pressure is mildly elevated this morning, I will wait and see when he is a little bit calm to see what his pressure does.  If necessary we may increase his amlodipine.  His hydroxyzine is already at 100 mg 3 times a day.  Sharma Covert, MD 09/04/2017, 10:58 AM

## 2017-09-04 NOTE — Progress Notes (Signed)
D: Patient observed up and social with peers in the dayroom. Patient states "I have a lot on my mind. My depression has been bad, worse since I've gotten older. Maybe I'm just wiser. Sometimes knowing more leads to more depression. I've tried everything." Patient's affect flat, mood depressed, ambivalent. Denies pain, physical complaints.   A: Medicated per orders, no prns required or requested. Medication education provided. Level III obs in place for safety. Emotional support offered. Patient encouraged to complete Suicide Safety Plan before discharge. Encouraged to attend and participate in unit programming.  Fall prevention plan in place and reviewed with patient as pt is a moderate fall risk.   R: Patient verbalizes understanding of POC, falls prevention education. Patient denies HI/AVH. Endorses passive SI however states, "I can be safe on the unit. I will come talk to staff." He remains safe on level III obs. Will continue to monitor throughout the night.

## 2017-09-04 NOTE — Plan of Care (Signed)
Patient verbalizes understanding of information, education provided. 
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
## 2017-09-04 NOTE — Plan of Care (Signed)
Pt progressing in the following metrics  D: pt found in his room meditating. Pt denies any si/hi/ah/vh and verbally agrees to approach staff if these become apparent. Pt attended group this afternoon. Pt is reclusive to this room and reads often. Pt skipped gym time to take a nap. Pt denies any physical pain at this time. Pt compliant with medication administration. Pt found in his room meditating and practices often stating that it helps to reduce his stress.  A: pt provided support and encouragement. Pt provided medications per protocol and standing orders. Q682m safety checks implemented and continued.  R: pt safe on the unit. Will continue to monitor.   Problem: Education: Goal: Emotional status will improve Outcome: Progressing Goal: Mental status will improve Outcome: Progressing   Problem: Activity: Goal: Interest or engagement in activities will improve Outcome: Progressing Goal: Sleeping patterns will improve Outcome: Progressing   Problem: Coping: Goal: Ability to demonstrate self-control will improve Outcome: Progressing   Problem: Health Behavior/Discharge Planning: Goal: Compliance with treatment plan for underlying cause of condition will improve Outcome: Progressing   Problem: Safety: Goal: Periods of time without injury will increase Outcome: Progressing   Problem: Education: Goal: Knowledge of the prescribed therapeutic regimen will improve Outcome: Progressing   Problem: Activity: Goal: Imbalance in normal sleep/wake cycle will improve Outcome: Progressing   Problem: Health Behavior/Discharge Planning: Goal: Compliance with therapeutic regimen will improve Outcome: Progressing   Problem: Safety: Goal: Ability to disclose and discuss suicidal ideas will improve Outcome: Progressing   Problem: Self-Concept: Goal: Will verbalize positive feelings about self Outcome: Progressing   Problem: Medication: Goal: Compliance with prescribed medication regimen  will improve Outcome: Progressing   Problem: Self-Concept: Goal: Ability to disclose and discuss suicidal ideas will improve Outcome: Progressing   Problem: Health Behavior/Discharge Planning: Goal: Ability to manage health-related needs will improve Outcome: Progressing   Problem: Coping: Goal: Level of anxiety will decrease Outcome: Progressing   Problem: Safety: Goal: Ability to remain free from injury will improve Outcome: Progressing

## 2017-09-04 NOTE — [facility] Group Notes (Signed)
LCSW Group Therapy Note  09/04/2017    10:30-11:30am   Type of Therapy and Topic:  Group Therapy: Anger and Coping Skills  Participation Level:  Active   Description of Group:   In this group, patients learned how to recognize the physical, cognitive, emotional, and behavioral responses they have to anger-provoking situations.  They identified how they usually or often react when angered, and learned how healthy and unhealthy coping skills work initially, but the unhealthy ones stop working.   They analyzed how their frequently-chosen coping skill is possibly beneficial and how it is possibly unhelpful.  The group discussed a variety of healthier coping skills that could help in resolving the actual issues, as well as how to go about planning for the the possibility of future similar situations.  Therapeutic Goals: 1. Patients will identify one thing that makes them angry and how they feel emotionally and physically, what their thoughts are or tend to be in those situations, and what healthy or unhealthy coping mechanism they typically use 2. Patients will identify how their coping technique works for them, as well as how it works against them. 3. Patients will explore possible new behaviors to use in future anger situations. 4. Patients will learn that anger itself is normal and cannot be eliminated, and that healthier coping skills can assist with resolving conflict rather than worsening situations.  Summary of Patient Progress:  The patient shared that he was angry on Thursday, "hit all the keys on the rage meter" and often becomes very intellectual and logical when angry.  This time, he stated when his family upset him, he submitted himself to the hospital for admission.    He described meditation for the group and talked about how it has helped him for the last 1-1/2 years.  Therapeutic Modalities:   Cognitive Behavioral Therapy Motivation Interviewing  Lynnell ChadMareida J Grossman-Orr  .

## 2017-09-04 NOTE — Progress Notes (Signed)
Adult Psychoeducational Group Note  Date:  09/04/2017 Time:  9:49 PM  Group Topic/Focus:  Wrap-Up Group:   The focus of this group is to help patients review their daily goal of treatment and discuss progress on daily workbooks.  Participation Level:  Active  Participation Quality:  Appropriate  Affect:  Appropriate  Cognitive:  Appropriate  Insight: Appropriate  Engagement in Group:  Engaged  Modes of Intervention:  Discussion  Additional Comments:  Patient attended group and participated.   [name] W Devine Dant 09/04/2017, 9:49 PM

## 2017-09-05 ENCOUNTER — Other Ambulatory Visit: Payer: Self-pay

## 2017-09-05 MED ORDER — QUETIAPINE FUMARATE 25 MG PO TABS
25.0000 mg | ORAL_TABLET | Freq: Once | ORAL | Status: AC
  Administered 2017-09-05: 25 mg via ORAL
  Filled 2017-09-05 (×2): qty 1

## 2017-09-05 MED ORDER — OXCARBAZEPINE 150 MG PO TABS
75.0000 mg | ORAL_TABLET | Freq: Every day | ORAL | Status: DC
  Filled 2017-09-05: qty 0.5

## 2017-09-05 MED ORDER — OXCARBAZEPINE 150 MG PO TABS
150.0000 mg | ORAL_TABLET | Freq: Two times a day (BID) | ORAL | Status: DC
  Administered 2017-09-05 – 2017-09-06 (×2): 150 mg via ORAL
  Filled 2017-09-05 (×4): qty 1

## 2017-09-05 MED ORDER — TRAZODONE HCL 100 MG PO TABS
100.0000 mg | ORAL_TABLET | Freq: Every evening | ORAL | Status: DC | PRN
  Administered 2017-09-06 – 2017-09-07 (×2): 100 mg via ORAL
  Filled 2017-09-05 (×2): qty 1

## 2017-09-05 NOTE — Consult Note (Signed)
  ECT: I wanted to drop a quick note in this patient's chart.  Last Friday I was asked to consider this patient for admission to our facility for ECT while he was still in an emergency room.  Having reviewed the chart he looked like he would be a very good candidate and I had verbally authorized admission but evidently we had no beds.  As a result he was [facility] behavioral [facility].  I just wanted to mention to the treatment team that if ECT was a consideration I had already looked at his chart and he did look like a good candidate.  Thank you.

## 2017-09-05 NOTE — Plan of Care (Signed)
Pt progressing in the following metrics  D: pt found in the hallway interacting with another patient. Pt states he slept "ok" last night. Pt rates his depression/hopelessness/anxiety a 8/8/7 out of 10 respectively. Pt denies any physical pain at this time. Pt stated to this writer that he wasn't having any si/hi/ah/vh and verbally agreed to approach staff if these become apparent. The pt did say he was having si on his self inventory though but again agreed he would not act on this while here. Pt could not promise once he left. The pts goal for today is to work on his hopelessness and will achieve this by meditating.  A: pt provided support and encouragement. Pt provided medications per protocol and standing orders. Q7180m safety checks implemented and continued.  R: pt safe on the unit. Will continue to monitor.   Problem: Activity: Goal: Interest or engagement in activities will improve Outcome: Progressing   Problem: Health Behavior/Discharge Planning: Goal: Compliance with treatment plan for underlying cause of condition will improve Outcome: Progressing   Problem: Physical Regulation: Goal: Ability to maintain clinical measurements within normal limits will improve Outcome: Progressing   Problem: Safety: Goal: Periods of time without injury will increase Outcome: Progressing   Problem: Activity: Goal: Interest or engagement in leisure activities will improve Outcome: Progressing Goal: Imbalance in normal sleep/wake cycle will improve Outcome: Progressing   Problem: Coping: Goal: Will verbalize feelings Outcome: Progressing   Problem: Health Behavior/Discharge Planning: Goal: Ability to make decisions will improve Outcome: Progressing Goal: Compliance with therapeutic regimen will improve Outcome: Progressing   Problem: Safety: Goal: Ability to disclose and discuss suicidal ideas will improve Outcome: Progressing   Problem: Self-Concept: Goal: Will verbalize positive  feelings about self Outcome: Progressing   Problem: Medication: Goal: Compliance with prescribed medication regimen will improve Outcome: Progressing   Problem: Self-Concept: Goal: Ability to disclose and discuss suicidal ideas will improve Outcome: Progressing   Problem: Safety: Goal: Ability to remain free from injury will improve Outcome: Progressing

## 2017-09-05 NOTE — [facility] Group Notes (Signed)
[facility] LCSW Group Therapy Note  Date/Time:  09/05/2017 9:00-10:00 or 10:00-11:00AM  Type of Therapy and Topic:  Group Therapy:  Healthy and Unhealthy Supports  Participation Level:  Active   Description of Group:  Patients in this group were introduced to the idea of adding a variety of healthy supports to address the various needs in their lives.Patients discussed what additional healthy supports could be helpful in their recovery and wellness after discharge in order to prevent future hospitalizations.   An emphasis was placed on using counselor, doctor, therapy groups, 12-step groups, and problem-specific support groups to expand supports.  They also worked as a group on developing a specific plan for several patients to deal with unhealthy supports through boundary-setting, psychoeducation with loved ones, and even termination of relationships.   Therapeutic Goals:   1)  discuss importance of adding supports to stay well once out of the hospital  2)  compare healthy versus unhealthy supports and identify some examples of each  3)  generate ideas and descriptions of healthy supports that can be added  4)  offer mutual support about how to address unhealthy supports  5)  encourage active participation in and adherence to discharge plan    Summary of Patient Progress:  The patient stated that current healthy supports in his life are good friends in his recovery community and Buddhist sangha, while current unhealthy supports include his wife and family members.  The patient expressed a willingness to add professional support(s) to help in his recovery journey.   Therapeutic Modalities:   Motivational Interviewing Brief Solution-Focused Therapy  Ambrose MantleMareida Grossman-Orr, LCSW

## 2017-09-05 NOTE — Progress Notes (Signed)
[facility]  09/05/2017 2:59 PM Zachary BarterJohn B [name]  MRN:  [id number] Subjective: I am okay about how I felt yesterday.  Talking about ECT.  I really need to have a family session on time since my family is a reason why I am here.  I need to do something besides hang out here.  I been through a lot of treatment and there is nothing that I having her look for.   Objective: 62 year old male who presented to behavioral health with suicidal ideation.  He reports increasing stressors and depression for the past 3 weeks as a result of watching his grandchildren.  Patient was seen assessed and evaluated by nurse practitioner today patient continues to endorse a significant amount of depression, anxiety, and ongoing suicidal thoughts.  Patient reports a history of worsening depression for about the past 6 years, and is unable to offer any insight about his suicidal thoughts.  Suicide is not an option"to cure my depression.  It is an alternative and there is nothing wrong with having suicidal thoughts.  I do not see was the big deal."Patient states it was he presented to the hospital as a result of his family's request, as he advised him that he was feeling suicidal as well.  He is sleeping and eating with no disturbances, however is waking up quite early in the morning and has to return to his room to lie back in the bed.  When assessing for suicidality patient reports. "I do not know how I could do it in here anyway. "Patient continues to present very negative, anhedonia, recurrent thoughts of death, chronic suicidality, flat affect and ongoing neurovegetative symptoms.  He is very negative, and does not feel as though any of the groups are beneficial and would not benefit from inpatient admission due to previous admissions and multiple facilities.  In his Trintellix was increased to 10 mg p.o. daily yesterday.  Patient was started on Trileptal 75 mg p.o. twice daily yesterday, which she is tolerating well at this  time.  He admitted to suicidal ideation..  Principal Problem: <principal problem not specified> Diagnosis:   Patient Active Problem List   Diagnosis Date Noted  . Severe recurrent major depression without psychotic features (HCC) [F33.2]   . Posttraumatic stress disorder [F43.10]   . Pneumonia [J18.9] 03/08/2017  . COPD with acute exacerbation (HCC) [J44.1] 01/19/2017  . OSA (obstructive sleep apnea) [G47.33] 01/11/2017  . Rash [R21] 01/11/2017  . Hyponatremia [E87.1] 10/13/2016  . Hypertension [I10] 07/09/2016  . History of adenomatous polyp of colon [Z86.010] 07/09/2016  . 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 Obesity, Class II, BMI 35-39.9 [E66.9] 07/09/2016  . Edema [R60.9] 07/09/2016  . COPD (chronic obstructive pulmonary disease) (HCC) [J44.9] 02/27/2015  . Dyspnea on exertion [R06.09] 01/18/2015  . Lung mass [R91.8] 10/10/2013  . Chronic maxillary sinusitis [J32.0] 09/27/2013  . GERD (gastroesophageal reflux disease) [K21.9] 09/27/2013  . Chronic cough [R05] 06/21/2013  . Alcohol dependence (HCC) [F10.20] 07/29/2011  . Bipolar affective disorder, depressed, moderate (HCC) [F31.32] 07/29/2011    Class: Chronic   Total Time spent with patient: 20 minutes  Past Psychiatric History: See admission H&P  Past Medical History:  Past Medical History:  Diagnosis Date  . ADD (attention deficit disorder)   . Anxiety    Panic  . Bipolar disorder (HCC)   . GERD (gastroesophageal reflux disease)    ? they thought GERD caused cough.   . Mental disorder    ADD, bipolar  .  Pneumonia 04/12/2013  . Shortness of breath   . Substance abuse [facility])     Past Surgical History:  Procedure Laterality Date  . APPENDECTOMY  1988  . COLONOSCOPY    . TONSILLECTOMY    . VIDEO BRONCHOSCOPY Bilateral 10/10/2013   Procedure: VIDEO BRONCHOSCOPY WITH FLUORO;  Surgeon: Oretha Milch, MD;  Location: [facility] ENDOSCOPY;  Service: Cardiopulmonary;  Laterality: Bilateral;  . VIDEO BRONCHOSCOPY N/A  10/13/2013   Procedure: VIDEO BRONCHOSCOPY, REMOVAL OF ENDOBRONCHIAL LESION, PROBABLE FOREIGN BODY;  Surgeon: Delight Ovens, MD;  Location: [facility] OR;  Service: Thoracic;  Laterality: N/A;  . VIDEO BRONCHOSCOPY N/A 11/28/2013   Procedure: VIDEO BRONCHOSCOPY;  Surgeon: Delight Ovens, MD;  Location: [facility] OR;  Service: Thoracic;  Laterality: N/A;  bronchoscopy for removal of foreign body  . VIDEO BRONCHOSCOPY Bilateral 03/10/2017   Procedure: VIDEO BRONCHOSCOPY WITH FLUORO;  Surgeon: Oretha Milch, MD;  Location: [facility] ENDOSCOPY;  Service: Cardiopulmonary;  Laterality: Bilateral;   Family History:  Family History  Problem Relation Age of Onset  . Breast cancer Mother   . Alcohol abuse Father        unclear cause of death. 18  . Colon cancer Father   . Alcohol abuse Sister        substance as well  . Alcohol abuse Brother        substance abuse  . Alcohol abuse Sister        substance abuse   Family Psychiatric  History: See admission H&P Social History:  Social History   Substance and Sexual Activity  Alcohol Use No   Comment: none at all     Social History   Substance and Sexual Activity  Drug Use No    Social History   Socioeconomic History  . Marital status: Married    Spouse name: Not on file  . Number of children: Not on file  . Years of education: Not on file  . Highest education level: Not on file  Occupational History  . Not on file  Social Needs  . Financial resource strain: Not on file  . Food insecurity:    Worry: Not on file    Inability: Not on file  . Transportation needs:    Medical: Not on file    Non-medical: Not on file  Tobacco Use  . Smoking status: Former Smoker    Packs/day: 1.00    Years: 20.00    Pack years: 20.00    Types: Cigarettes    Last attempt to quit: 02/16/1993    Years since quitting: 24.5  . Smokeless tobacco: Never Used  Substance and Sexual Activity  . Alcohol use: No    Comment: none at all  . Drug use: No  . Sexual  activity: Yes  Lifestyle  . Physical activity:    Days per week: Not on file    Minutes per session: Not on file  . Stress: Not on file  Relationships  . Social connections:    Talks on phone: Not on file    Gets together: Not on file    Attends religious service: Not on file    Active member of club or organization: Not on file    Attends meetings of clubs or organizations: Not on file    Relationship status: Not on file  Other Topics Concern  . Not on file  Social History Narrative   Married. Lives with wife. 2 children. Soon to be grandfather 07/2016.  Freelance work/semi retired. Editing/writing.       Hobbies: enjoys Diplomatic Services operational officer poetry   Additional Social History:      Sleep: Fair  Appetite:  Fair  Current Medications: Current Facility-Administered Medications  Medication Dose Route Frequency Provider Last Rate Last Dose  . acetaminophen (TYLENOL) tablet 650 mg  650 mg Oral Q6H PRN Laveda Abbe, NP      . albuterol (PROVENTIL HFA;VENTOLIN HFA) 108 (90 Base) MCG/ACT inhaler 2 puff  2 puff Inhalation Q6H PRN Laveda Abbe, NP      . alum & mag hydroxide-simeth (MAALOX/MYLANTA) 200-200-20 MG/5ML suspension 30 mL  30 mL Oral Q4H PRN Laveda Abbe, NP      . amLODipine (NORVASC) tablet 5 mg  5 mg Oral Daily Laveda Abbe, NP   5 mg at 09/05/17 0813  . ferrous sulfate tablet 325 mg  325 mg Oral QHS Laveda Abbe, NP   325 mg at 09/04/17 2138  . fluticasone furoate-vilanterol (BREO ELLIPTA) 100-25 MCG/INH 1 puff  1 puff Inhalation Daily Laveda Abbe, NP   1 puff at 09/05/17 1610   And  . umeclidinium bromide (INCRUSE ELLIPTA) 62.5 MCG/INH 1 puff  1 puff Inhalation Daily Laveda Abbe, NP      . gabapentin (NEURONTIN) capsule 600 mg  600 mg Oral TID Antonieta Pert, MD   600 mg at 09/05/17 1401  . hydrOXYzine (ATARAX/VISTARIL) tablet 100 mg  100 mg Oral TID Laveda Abbe, NP   100 mg at 09/05/17 1401  .  hydrOXYzine (ATARAX/VISTARIL) tablet 25 mg  25 mg Oral TID PRN Laveda Abbe, NP      . losartan (COZAAR) tablet 100 mg  100 mg Oral Daily Laveda Abbe, NP   100 mg at 09/05/17 0813  . magnesium hydroxide (MILK OF MAGNESIA) suspension 30 mL  30 mL Oral Daily PRN Laveda Abbe, NP      . Melatonin TABS 5 mg  5 mg Oral QHS Laveda Abbe, NP   5 mg at 09/04/17 2139  . OXcarbazepine (TRILEPTAL) tablet 75 mg  75 mg Oral BID Antonieta Pert, MD   75 mg at 09/05/17 0814  . traZODone (DESYREL) tablet 50 mg  50 mg Oral QHS PRN Laveda Abbe, NP   50 mg at 09/05/17 0124  . traZODone (DESYREL) tablet 50-100 mg  50-100 mg Oral QHS Laveda Abbe, NP   100 mg at 09/04/17 2140  . vortioxetine HBr (TRINTELLIX) tablet 10 mg  10 mg Oral QHS Antonieta Pert, MD   10 mg at 09/04/17 2138    Lab Results:  No results found for this or any previous visit (from the past 48 hour(s)).  Blood Alcohol level:  Lab Results  Component Value Date   ETH <10 09/02/2017   ETH <11 07/28/2011    Metabolic Disorder Labs: Lab Results  Component Value Date   HGBA1C 5.3 07/09/2016   No results found for: PROLACTIN Lab Results  Component Value Date   CHOL 137 08/09/2015   TRIG 46 08/09/2015   HDL 44 08/09/2015   LDLCALC 84 08/09/2015    Physical Findings: AIMS: Facial and Oral Movements Muscles of Facial Expression: None, normal Lips and Perioral Area: None, normal Jaw: None, normal Tongue: None, normal,Extremity Movements Upper (arms, wrists, hands, fingers): None, normal Lower (legs, knees, ankles, toes): None, normal, Trunk Movements Neck, shoulders, hips: None, normal, Overall Severity Severity of abnormal movements (highest score from questions  above): None, normal Incapacitation due to abnormal movements: None, normal Patient's awareness of abnormal movements (rate only patient's report): No Awareness, Dental Status Current problems with teeth and/or  dentures?: No Does patient usually wear dentures?: No  CIWA:    COWS:     Musculoskeletal: Strength & Muscle Tone: within normal limits Gait & Station: normal Patient leans: N/A  Psychiatric Specialty Exam: Physical Exam  Nursing note and vitals reviewed. Constitutional: He is oriented to person, place, and time. He appears well-developed and well-nourished.  HENT:  Head: Normocephalic and atraumatic.  Respiratory: Effort normal.  GI: Bowel sounds are normal.  Neurological: He is alert and oriented to person, place, and time.    ROS   Blood pressure (!) 136/93, pulse 77, temperature (!) 97.3 F (36.3 C), temperature source Oral, resp. rate 18, height 5\' 10"  (1.778 m), weight 117.9 kg (260 lb).Body mass index is 37.31 kg/m.  General Appearance: Disheveled  Eye Contact:  Fair  Speech:  Normal Rate  Volume:  Normal  Mood:  Anxious, Depressed, Hopeless and Irritable  Affect:  Congruent  Thought Process:  Coherent  Orientation:  Full (Time, Place, and Person)  Thought Content:  Logical  Suicidal Thoughts:  Yes.  without intent/plan  Homicidal Thoughts:  No  Memory:  Immediate;   Fair Recent;   Fair Remote;   Fair  Judgement:  Impaired  Insight:  Lacking  Psychomotor Activity:  Increased  Concentration:  Concentration: Fair and Attention Span: Fair  Recall:  [employer] of Knowledge:  Fair  Language:  Fair  Akathisia:  Negative  Handed:  Right  AIMS (if indicated):     Assets:  Desire for Improvement Resilience  ADL's:  Intact  Cognition:  WNL  Sleep:  Number of Hours: 5.75     Treatment Plan Summary: Daily contact with patient to assess and evaluate symptoms and progress in treatment, Medication management and Plan Patient is seen and examined.  Patient is a 62 year old male with the above-stated past psychiatric history who is seen in follow-up.  He is essentially unchanged from yesterday.  His Trintellix was just increased yesterday.  He complains of anxiety.  He  is already on 600 mg of Neurontin 3 times daily.  He stated that he was unable to tolerate BuSpar.  I will try to give him some low-dose Trileptal today.  We will start at 75 mg p.o. twice daily and see if that decreases any of his agitation.  He is very negative but this morning, and we will just have to work through this with his depression.  His blood pressure is mildly elevated this morning, I will wait and see when he is a little bit calm to see what his pressure does.  If necessary we may increase his amlodipine.  His hydroxyzine is already at 100 mg 3 times a day. Increase Trileptal 150mg  po BID.   Truman Hayward, FNP 09/05/2017, 2:59 PM

## 2017-09-06 ENCOUNTER — Encounter: Payer: Self-pay | Admitting: Certified Registered"

## 2017-09-06 MED ORDER — OXCARBAZEPINE 150 MG PO TABS
150.0000 mg | ORAL_TABLET | Freq: Two times a day (BID) | ORAL | Status: DC
  Administered 2017-09-06 – 2017-09-09 (×8): 150 mg via ORAL
  Filled 2017-09-06 (×12): qty 1

## 2017-09-06 MED ORDER — AMLODIPINE BESYLATE 5 MG PO TABS
5.0000 mg | ORAL_TABLET | Freq: Once | ORAL | Status: AC
  Administered 2017-09-06: 5 mg via ORAL
  Filled 2017-09-06 (×2): qty 1

## 2017-09-06 MED ORDER — AMLODIPINE BESYLATE 10 MG PO TABS
10.0000 mg | ORAL_TABLET | Freq: Every day | ORAL | Status: DC
  Administered 2017-09-07 – 2017-09-09 (×3): 10 mg via ORAL
  Filled 2017-09-06 (×5): qty 1

## 2017-09-06 NOTE — Tx Team (Signed)
Interdisciplinary Treatment and Diagnostic Plan Update  09/06/2017 Time of Session: 0930 [name] MRN: [id number]  Principal Diagnosis: <principal problem not specified>  Secondary Diagnoses: Active Problems:   Severe recurrent major depression without psychotic features (HCC)   Posttraumatic stress disorder   Current Medications:  Current Facility-Administered Medications  Medication Dose Route Frequency Provider Last Rate Last Dose  . acetaminophen (TYLENOL) tablet 650 mg  650 mg Oral Q6H PRN Laveda Abbe, NP      . albuterol (PROVENTIL HFA;VENTOLIN HFA) 108 (90 Base) MCG/ACT inhaler 2 puff  2 puff Inhalation Q6H PRN Laveda Abbe, NP      . alum & mag hydroxide-simeth (MAALOX/MYLANTA) 200-200-20 MG/5ML suspension 30 mL  30 mL Oral Q4H PRN Laveda Abbe, NP      . Melene Muller ON 09/07/2017] amLODipine (NORVASC) tablet 10 mg  10 mg Oral Daily Antonieta Pert, MD      . ferrous sulfate tablet 325 mg  325 mg Oral QHS Laveda Abbe, NP   325 mg at 09/05/17 2149  . fluticasone furoate-vilanterol (BREO ELLIPTA) 100-25 MCG/INH 1 puff  1 puff Inhalation Daily Laveda Abbe, NP   1 puff at 09/06/17 1191   And  . umeclidinium bromide (INCRUSE ELLIPTA) 62.5 MCG/INH 1 puff  1 puff Inhalation Daily Laveda Abbe, NP      . gabapentin (NEURONTIN) capsule 600 mg  600 mg Oral TID Antonieta Pert, MD   600 mg at 09/06/17 1402  . hydrOXYzine (ATARAX/VISTARIL) tablet 100 mg  100 mg Oral TID Laveda Abbe, NP   100 mg at 09/06/17 1402  . hydrOXYzine (ATARAX/VISTARIL) tablet 25 mg  25 mg Oral TID PRN Laveda Abbe, NP   25 mg at 09/06/17 0311  . losartan (COZAAR) tablet 100 mg  100 mg Oral Daily Laveda Abbe, NP   100 mg at 09/06/17 0815  . magnesium hydroxide (MILK OF MAGNESIA) suspension 30 mL  30 mL Oral Daily PRN Laveda Abbe, NP      . Melatonin TABS 5 mg  5 mg Oral QHS Laveda Abbe, NP   5 mg at 09/05/17  2149  . OXcarbazepine (TRILEPTAL) tablet 150 mg  150 mg Oral BID Antonieta Pert, MD   150 mg at 09/06/17 1216  . traZODone (DESYREL) tablet 100 mg  100 mg Oral QHS PRN,MR X 1 Money, Gerlene Burdock, FNP      . vortioxetine HBr (TRINTELLIX) tablet 10 mg  10 mg Oral QHS Antonieta Pert, MD   10 mg at 09/05/17 2149   PTA Medications: Medications Prior to Admission  Medication Sig Dispense Refill Last Dose  . albuterol (PROAIR HFA) 108 (90 Base) MCG/ACT inhaler Inhale 2 puffs into the lungs every 6 (six) hours as needed for wheezing or shortness of breath.   Past Month at Unknown time  . albuterol (PROVENTIL) (2.5 MG/3ML) 0.083% nebulizer solution Take 3 mLs (2.5 mg total) by nebulization every 6 (six) hours as needed for wheezing or shortness of breath. (Patient taking differently: Take 2.5 mg by nebulization See admin instructions. Use one vial (2.5 mg) via nebulization two - three times daily for wheezing and shortness of breath) 75 mL 12 09/01/2017 at Unknown time  . amLODipine (NORVASC) 5 MG tablet Take 1 tablet (5 mg total) by mouth daily. 30 tablet 5 09/01/2017 at Unknown time  . ferrous sulfate 325 (65 FE) MG tablet Take 325 mg by mouth at bedtime.  09/01/2017 at Unknown time  . Fluticasone-Umeclidin-Vilant (TRELEGY ELLIPTA) 100-62.5-25 MCG/INH AEPB Inhale 1 puff into the lungs daily. (Patient taking differently: Inhale 1 puff into the lungs every evening. ) 2 each 0 09/02/2017 at Unknown time  . gabapentin (NEURONTIN) 600 MG tablet Take 600 mg by mouth 3 (three) times daily.   2 09/02/2017 at Unknown time  . hydrOXYzine (VISTARIL) 50 MG capsule Take 100 mg by mouth 3 (three) times daily.   2 09/02/2017 at Unknown time  . losartan (COZAAR) 100 MG tablet Take 1 tablet (100 mg total) by mouth daily. 90 tablet 3 09/01/2017 at Unknown time  . Melatonin 5 MG LOZG Place 5 mg under the tongue at bedtime.   09/01/2017 at Unknown time  . naproxen sodium (ALEVE) 220 MG tablet Take 440 mg by mouth daily as  needed (pain).   Past Week at Unknown time  . PRESCRIPTION MEDICATION Inhale into the lungs at bedtime. CPAP   Taking  . traZODone (DESYREL) 100 MG tablet Take 50-100 mg by mouth at bedtime.    09/01/2017 at Unknown time  . TRINTELLIX 5 MG TABS tablet Take 5 mg by mouth daily.  0 09/01/2017 at Unknown time    Patient Stressors: Financial difficulties Health problems Marital or family conflict  Patient Strengths: Ability for insight Average or above average intelligence Capable of independent living Wellsite geologist fund of knowledge  Treatment Modalities: Medication Management, Group therapy, Case management,  1 to 1 session with clinician, Psychoeducation, Recreational therapy.   Physician Treatment Plan for Primary Diagnosis: <principal problem not specified> Long Term Goal(s): Improvement in symptoms so as ready for discharge Improvement in symptoms so as ready for discharge   Short Term Goals: Ability to identify changes in lifestyle to reduce recurrence of condition will improve Ability to verbalize feelings will improve Ability to disclose and discuss suicidal ideas Ability to demonstrate self-control will improve Ability to identify and develop effective coping behaviors will improve Ability to maintain clinical measurements within normal limits will improve Ability to identify changes in lifestyle to reduce recurrence of condition will improve Ability to verbalize feelings will improve Ability to disclose and discuss suicidal ideas Ability to demonstrate self-control will improve Ability to identify and develop effective coping behaviors will improve Ability to maintain clinical measurements within normal limits will improve  Medication Management: Evaluate patient's response, side effects, and tolerance of medication regimen.  Therapeutic Interventions: 1 to 1 sessions, Unit Group sessions and Medication administration.  Evaluation of Outcomes:  Progressing  Physician Treatment Plan for Secondary Diagnosis: Active Problems:   Severe recurrent major depression without psychotic features (HCC)   Posttraumatic stress disorder  Long Term Goal(s): Improvement in symptoms so as ready for discharge Improvement in symptoms so as ready for discharge   Short Term Goals: Ability to identify changes in lifestyle to reduce recurrence of condition will improve Ability to verbalize feelings will improve Ability to disclose and discuss suicidal ideas Ability to demonstrate self-control will improve Ability to identify and develop effective coping behaviors will improve Ability to maintain clinical measurements within normal limits will improve Ability to identify changes in lifestyle to reduce recurrence of condition will improve Ability to verbalize feelings will improve Ability to disclose and discuss suicidal ideas Ability to demonstrate self-control will improve Ability to identify and develop effective coping behaviors will improve Ability to maintain clinical measurements within normal limits will improve     Medication Management: Evaluate patient's response, side effects, and tolerance of medication regimen.  Therapeutic Interventions: 1 to 1 sessions, Unit Group sessions and Medication administration.  Evaluation of Outcomes: Progressing   RN Treatment Plan for Primary Diagnosis: <principal problem not specified> Long Term Goal(s): Knowledge of disease and therapeutic regimen to maintain health will improve  Short Term Goals: Ability to identify and develop effective coping behaviors will improve and Compliance with prescribed medications will improve  Medication Management: RN will administer medications as ordered by provider, will assess and evaluate patient's response and provide education to patient for prescribed medication. RN will report any adverse and/or side effects to prescribing provider.  Therapeutic Interventions:  1 on 1 counseling sessions, Psychoeducation, Medication administration, Evaluate responses to treatment, Monitor vital signs and CBGs as ordered, Perform/monitor CIWA, COWS, AIMS and Fall Risk screenings as ordered, Perform wound care treatments as ordered.  Evaluation of Outcomes: Progressing   LCSW Treatment Plan for Primary Diagnosis: <principal problem not specified> Long Term Goal(s): Safe transition to appropriate next level of care at discharge, Engage patient in therapeutic group addressing interpersonal concerns.  Short Term Goals: Engage patient in aftercare planning with referrals and resources, Increase social support and Increase skills for wellness and recovery  Therapeutic Interventions: Assess for all discharge needs, 1 to 1 time with Social worker, Explore available resources and support systems, Assess for adequacy in community support network, Educate family and significant other(s) on suicide prevention, Complete Psychosocial Assessment, Interpersonal group therapy.  Evaluation of Outcomes: Progressing   Progress in Treatment: Attending groups: Yes. Participating in groups: Yes. Taking medication as prescribed: Yes. Toleration medication: Yes. Family/Significant other contact made: No, will contact:  wife Patient understands diagnosis: Yes. Discussing patient identified problems/goals with staff: Yes. Medical problems stabilized or resolved: Yes. Denies suicidal/homicidal ideation: Yes. Issues/concerns per patient self-inventory: No. Other: none  New problem(s) identified: No, Describe:  none  New Short Term/Long Term Goal(s):  Patient Goals:  "get a handle on my depression"  Discharge Plan or Barriers:   Reason for Continuation of Hospitalization: Depression Medication stabilization  Estimated Length of Stay: 2-4 days  Attendees: Patient: [name] 09/06/2017   Physician: Dr. Jola Babinskilary, MD 09/06/2017   Nursing: Norma FredricksonMichael Scearce, RN 09/06/2017   RN Care  Manager: 09/06/2017   Social Worker: Daleen SquibbGreg Mariaeduarda Defranco, LCSW 09/06/2017   Recreational Therapist:  09/06/2017   Other:  09/06/2017   Other:  09/06/2017   Other: 09/06/2017     Scribe for Treatment Team: Lorri FrederickWierda, Reon Hunley [name], LCSW 09/06/2017 2:39 PM

## 2017-09-06 NOTE — Plan of Care (Signed)
  Problem: Health Behavior/Discharge Planning: Goal: Compliance with treatment plan for underlying cause of condition will improve Outcome: Progressing   Problem: Activity: Goal: Interest or engagement in leisure activities will improve Outcome: Progressing   

## 2017-09-06 NOTE — [facility] Group Notes (Signed)
[facility] LCSW Group Therapy Note  Date/Time: 09/06/17, 1315  Type of Therapy and Topic:  Group Therapy:  Overcoming Obstacles  Participation Level:  active  Description of Group:    In this group patients will be encouraged to explore what they see as obstacles to their own wellness and recovery. They will be guided to discuss their thoughts, feelings, and behaviors related to these obstacles. The group will process together ways to cope with barriers, with attention given to specific choices patients can make. Each patient will be challenged to identify changes they are motivated to make in order to overcome their obstacles. This group will be process-oriented, with patients participating in exploration of their own experiences as well as giving and receiving support and challenge from other group members.  Therapeutic Goals: 1. Patient will identify personal and current obstacles as they relate to admission. 2. Patient will identify barriers that currently interfere with their wellness or overcoming obstacles.  3. Patient will identify feelings, thought process and behaviors related to these barriers. 4. Patient will identify two changes they are willing to make to overcome these obstacles:    Summary of Patient Progress: Pt reports that  Fear and mental health are obstacles in his life currently.  Pt was active during group discussion regarding steps he could take to overcome obstacles.      Therapeutic Modalities:   Cognitive Behavioral Therapy Solution Focused Therapy Motivational Interviewing Relapse Prevention Therapy  Daleen SquibbGreg Aseret Hoffman, LCSW

## 2017-09-06 NOTE — Progress Notes (Signed)
Adult Psychoeducational Group Note  Date:  09/06/2017 Time:  2:04 AM  Group Topic/Focus:  Wrap-Up Group:   The focus of this group is to help patients review their daily goal of treatment and discuss progress on daily workbooks.  Participation Level:  Active  Participation Quality:  Appropriate  Affect:  Appropriate  Cognitive:  Appropriate  Insight: Appropriate  Engagement in Group:  Engaged  Modes of Intervention:  Discussion  Additional Comments:  Pt expressed frustration because he did not accomplish his goals.  Pt rated the day at 6/10.  Laurann Mcmorris 09/06/2017, 2:04 AM

## 2017-09-06 NOTE — Progress Notes (Signed)
Recreation Therapy Notes  Date: 7.22.19 Time: 0930 Location: 300 Hall Dayroom  Group Topic: Stress Management  Goal Area(s) Addresses:  Patient will verbalize importance of using healthy stress management.  Patient will identify positive emotions associated with healthy stress management.   Intervention: Stress Management  Activity :  Guided Imagery.  LRT introduced the stress management technique of guided imagery.  LRT read a script that allowed patients to envision floating on a cloud.  Patients were to listen and follow along as LRT read script to engage in the activity.  Education:  Stress Management, Discharge Planning.   Education Outcome: Acknowledges edcuation/In group clarification offered/Needs additional education  Clinical Observations/Feedback: Pt did not attend group.     Caroll RancherMarjette Natilee Gauer, LRT/CTRS         Caroll RancherLindsay, Malyk Girouard A 09/06/2017 1:44 PM

## 2017-09-06 NOTE — Progress Notes (Signed)
D:  [name] was in his room upon initial approach.  He continues to report feeling depressed.  He talked about wanting some more ECT because "that helped in 2015."  He denied SI/HI or A/V hallucinations.  He denied any pain or discomfort and appeared to be in no physical distress.  He talked about the poor relationship with his wife and "maybe I need to see a lawyer than being here."  He is upset that he cannot take that step in his life.  "I just was to be alone, in a small house that I can clean quickly so "I can write, read and have peace."   A:  1:1 with RN for support and encouragement.  Medications as ordered.  Q 15 minute checks maintained for safety.  Encouraged participation in group and unit activities.   R:  Keatin remains safe on the unit.  We will continue to monitor the progress towards his goals.

## 2017-09-06 NOTE — Plan of Care (Signed)
Pt progressing in the following metrics  D: pt found in the dayroom interacting with his peers. Pt states he slept poorly last night. Pt denies verbally any si/hi/ah/vh and verbally agrees to approach staff if these become apparent. The pt did put on his self inventory that he was having thoughts of hurting himself but the pt again verbally contracts for safety.  Pt rates his depression/hopelessness/anxiety all 8 out of 10. Pt states his goal for today is to change his attitude towards life and to at least stop thinking suicidal thoughts so much. Pt will achieve this by staying positive. Pt denies any physical pain at this time. The pt spoke with his wife for the first time since coming to the hospital. She was concerned that he had relapsed and that he wanted to [facility] a [facility]. She spoke to how the last time he had this done she didn't think it had helped, but TMS did. The wife spoke to how she hopes he will have E[facility] if he does go this route. She also said that the patient "goes blindly with whatever Doctors tell him". She stated that the pt wants to "discuss things" tonight if she comes to visit.  A: pt provided support and encouragement. Pt provided medications per protocol and standing orders. [id number] safety checks implemented and continued. R: pt safe on the unit. Will continue to monitor.    Problem: Activity: Goal: Interest or engagement in activities will improve Outcome: Progressing   Problem: Coping: Goal: Ability to verbalize frustrations and anger appropriately will improve Outcome: Progressing Goal: Ability to demonstrate self-control will improve Outcome: Progressing   Problem: Health Behavior/Discharge Planning: Goal: Identification of resources available to assist in meeting health care needs will improve Outcome: Progressing   Problem: Physical Regulation: Goal: Ability to maintain clinical measurements within normal limits will improve Outcome: Progressing    Problem: Education: Goal: Utilization of techniques to improve thought processes will improve Outcome: Progressing   Problem: Coping: Goal: Coping ability will improve Outcome: Progressing   Problem: Health Behavior/Discharge Planning: Goal: Compliance with therapeutic regimen will improve Outcome: Progressing   Problem: Safety: Goal: Ability to identify and utilize support systems that promote safety will improve Outcome: Progressing   Problem: Education: Goal: Ability to make informed decisions regarding treatment will improve Outcome: Progressing   Problem: Health Behavior/Discharge Planning: Goal: Identification of resources available to assist in meeting health care needs will improve Outcome: Progressing   Problem: Self-Concept: Goal: Will verbalize positive feelings about self Outcome: Progressing   Problem: Education: Goal: Knowledge of General Education information will improve Description Including pain rating scale, medication(s)/side effects and non-pharmacologic comfort measures Outcome: Progressing   Problem: Safety: Goal: Ability to remain free from injury will improve Outcome: Progressing

## 2017-09-06 NOTE — Anesthesia Preprocedure Evaluation (Deleted)
Anesthesia Evaluation  Patient identified by MRN, date of birth, ID band Patient awake    Reviewed: Allergy & Precautions, H&P , NPO status , reviewed documented beta blocker date and time   Airway        Dental   Pulmonary shortness of breath, sleep apnea , pneumonia, COPD, former smoker,           Cardiovascular hypertension,   03/2013 ECHO Study Conclusions  - Left ventricle: The cavity size was normal. Wall thickness was increased in a pattern of mild LVH. Systolic function was normal. The estimated ejection fraction was in the range of 60% to 65%. Wall motion was normal; there were no regional wall motion abnormalities. Doppler parameters are consistent with abnormal left ventricular relaxation (grade 1 diastolic dysfunction). - Left atrium: The atrium was mildly dilated.   Neuro/Psych PSYCHIATRIC DISORDERS Anxiety Depression Bipolar Disorder    GI/Hepatic GERD  ,  Endo/Other    Renal/GU      Musculoskeletal  (+) Arthritis ,   Abdominal   Peds  Hematology   Anesthesia Other Findings Past Medical History: No date: ADD (attention deficit disorder) No date: Anxiety     Comment:  Panic No date: Bipolar disorder (HCC) No date: GERD (gastroesophageal reflux disease)     Comment:  ? they thought GERD caused cough.  No date: Mental disorder     Comment:  ADD, bipolar 04/12/2013: Pneumonia No date: Shortness of breath No date: Substance abuse [facility](HCC)  Past Surgical History: 1988: APPENDECTOMY No date: COLONOSCOPY No date: TONSILLECTOMY 10/10/2013: VIDEO BRONCHOSCOPY; Bilateral     Comment:  Procedure: VIDEO BRONCHOSCOPY WITH FLUORO;  Surgeon:               Oretha Milchakesh Alva V, MD;  [facility];  Service:               Cardiopulmonary;  Laterality: Bilateral; 10/13/2013: VIDEO BRONCHOSCOPY; N/A     Comment:  Procedure: VIDEO BRONCHOSCOPY, REMOVAL OF ENDOBRONCHIAL               LESION, PROBABLE  FOREIGN BODY;  Surgeon: Delight OvensEdward B               Gerhardt, MD;  Location: [facility] OR;  Service: Thoracic;                Laterality: N/A; 11/28/2013: VIDEO BRONCHOSCOPY; N/A     Comment:  Procedure: VIDEO BRONCHOSCOPY;  Surgeon: Delight OvensEdward B               Gerhardt, MD;  Location: [facility] OR;  Service: Thoracic;                Laterality: N/A;  bronchoscopy for removal of foreign               body 03/10/2017: VIDEO BRONCHOSCOPY; Bilateral     Comment:  Procedure: VIDEO BRONCHOSCOPY WITH FLUORO;  Surgeon:               Oretha MilchAlva, Rakesh V, MD;  [facility];  Service:               Cardiopulmonary;  Laterality: Bilateral;  BMI    Body Mass Index:  37.31 kg/m      Reproductive/Obstetrics                             Anesthesia Physical Anesthesia Plan  ASA:   Anesthesia Plan: General   Post-op  Pain Management:    Induction:   PONV Risk Score and Plan: Treatment may vary due to age or medical condition and TIVA  Airway Management Planned:   Additional Equipment:   Intra-op Plan:   Post-operative Plan:   Informed Consent: I have reviewed the patients History and Physical, chart, labs and discussed the procedure including the risks, benefits and alternatives for the proposed anesthesia with the patient or authorized representative who has indicated his/her understanding and acceptance.   Dental Advisory Given  Plan Discussed with: CRNA  Anesthesia Plan Comments:         Anesthesia Quick Evaluation

## 2017-09-06 NOTE — Progress Notes (Signed)
[facility] MD Progress Note  09/06/2017 10:56 AM [name]  MRN:  [id number] Subjective: Patient is seen and examined.  Patient is a 62 year old male with a past psychiatric history significant for major depression, posttraumatic stress disorder, generalized anxiety seen in follow-up.  He stated he is essentially unchanged.  He is frustrated by being on the psychiatric unit.  He stated he just wants to go someplace and be in a quiet area.  He stated that his family situation is making everything worse for him.  He stated last night he felt like he was going to "blow up", and was given a as needed of Seroquel.  That did help him.  He is convinced that being here is not helping him all that much.  We discussed the possibility of ECT today.  There was good discussion of this while he was in the emergency room.  Unfortunately at [facility] the list for ECT was quite crowded, and would not be able to be transferred at that time.  We discussed this a.m. at least listening to Dr. Toni Amend in consultation to see if it would be of benefit for him.  He did state he felt as though the addition of the Trileptal did take some of the edge off.  We discussed possibly increasing that.  He has only been on the Trintellix 10 mg p.o. daily for a few days. Principal Problem: <principal problem not specified> Diagnosis:   Patient Active Problem List   Diagnosis Date Noted  . Severe recurrent major depression without psychotic features (HCC) [F33.2]   . Posttraumatic stress disorder [F43.10]   . Pneumonia [J18.9] 03/08/2017  . COPD with acute exacerbation (HCC) [J44.1] 01/19/2017  . OSA (obstructive sleep apnea) [G47.33] 01/11/2017  . Rash [R21] 01/11/2017  . Hyponatremia [E87.1] 10/13/2016  . Hypertension [I10] 07/09/2016  . History of adenomatous polyp of colon [Z86.010] 07/09/2016  . 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 Obesity, Class II, BMI 35-39.9 [E66.9] 07/09/2016  . Edema [R60.9] 07/09/2016  . COPD (chronic  obstructive pulmonary disease) (HCC) [J44.9] 02/27/2015  . Dyspnea on exertion [R06.09] 01/18/2015  . Lung mass [R91.8] 10/10/2013  . Chronic maxillary sinusitis [J32.0] 09/27/2013  . GERD (gastroesophageal reflux disease) [K21.9] 09/27/2013  . Chronic cough [R05] 06/21/2013  . Alcohol dependence (HCC) [F10.20] 07/29/2011  . Bipolar affective disorder, depressed, moderate (HCC) [F31.32] 07/29/2011    Class: Chronic   Total Time spent with patient: 20 minutes  Past Psychiatric History: See admission H&P  Past Medical History:  Past Medical History:  Diagnosis Date  . ADD (attention deficit disorder)   . Anxiety    Panic  . Bipolar disorder (HCC)   . GERD (gastroesophageal reflux disease)    ? they thought GERD caused cough.   . Mental disorder    ADD, bipolar  . Pneumonia 04/12/2013  . Shortness of breath   . Substance abuse [facility]     Past Surgical History:  Procedure Laterality Date  . APPENDECTOMY  1988  . COLONOSCOPY    . TONSILLECTOMY    . VIDEO BRONCHOSCOPY Bilateral 10/10/2013   Procedure: VIDEO BRONCHOSCOPY WITH FLUORO;  Surgeon: Oretha Milch, MD;  Location: [facility] ENDOSCOPY;  Service: Cardiopulmonary;  Laterality: Bilateral;  . VIDEO BRONCHOSCOPY N/A 10/13/2013   Procedure: VIDEO BRONCHOSCOPY, REMOVAL OF ENDOBRONCHIAL LESION, PROBABLE FOREIGN BODY;  Surgeon: Delight Ovens, MD;  Location: [facility] OR;  Service: Thoracic;  Laterality: N/A;  . VIDEO BRONCHOSCOPY N/A 11/28/2013   Procedure: VIDEO BRONCHOSCOPY;  Surgeon: Ramon Dredge  Bari Edward, MD;  Location: [facility] OR;  Service: Thoracic;  Laterality: N/A;  bronchoscopy for removal of foreign body  . VIDEO BRONCHOSCOPY Bilateral 03/10/2017   Procedure: VIDEO BRONCHOSCOPY WITH FLUORO;  Surgeon: Oretha Milch, MD;  Location: [facility] ENDOSCOPY;  Service: Cardiopulmonary;  Laterality: Bilateral;   Family History:  Family History  Problem Relation Age of Onset  . Breast cancer Mother   . Alcohol abuse Father        unclear cause of death. 88   . Colon cancer Father   . Alcohol abuse Sister        substance as well  . Alcohol abuse Brother        substance abuse  . Alcohol abuse Sister        substance abuse   Family Psychiatric  History: See admission H&P Social History:  Social History   Substance and Sexual Activity  Alcohol Use No   Comment: none at all     Social History   Substance and Sexual Activity  Drug Use No    Social History   Socioeconomic History  . Marital status: Married    Spouse name: Not on file  . Number of children: Not on file  . Years of education: Not on file  . Highest education level: Not on file  Occupational History  . Not on file  Social Needs  . Financial resource strain: Not on file  . Food insecurity:    Worry: Not on file    Inability: Not on file  . Transportation needs:    Medical: Not on file    Non-medical: Not on file  Tobacco Use  . Smoking status: Former Smoker    Packs/day: 1.00    Years: 20.00    Pack years: 20.00    Types: Cigarettes    Last attempt to quit: 02/16/1993    Years since quitting: 24.5  . Smokeless tobacco: Never Used  Substance and Sexual Activity  . Alcohol use: No    Comment: none at all  . Drug use: No  . Sexual activity: Yes  Lifestyle  . Physical activity:    Days per week: Not on file    Minutes per session: Not on file  . Stress: Not on file  Relationships  . Social connections:    Talks on phone: Not on file    Gets together: Not on file    Attends religious service: Not on file    Active member of club or organization: Not on file    Attends meetings of clubs or organizations: Not on file    Relationship status: Not on file  Other Topics Concern  . Not on file  Social History Narrative   Married. Lives with wife. 2 children. Soon to be grandfather 07/2016.       Freelance work/semi retired. Editing/writing.       Hobbies: enjoys Diplomatic Services operational officer poetry   Additional Social History:                         Sleep:  Fair  Appetite:  Fair  Current Medications: Current Facility-Administered Medications  Medication Dose Route Frequency Provider Last Rate Last Dose  . acetaminophen (TYLENOL) tablet 650 mg  650 mg Oral Q6H PRN Laveda Abbe, NP      . albuterol (PROVENTIL HFA;VENTOLIN HFA) 108 (90 Base) MCG/ACT inhaler 2 puff  2 puff Inhalation Q6H PRN Laveda Abbe, NP      .  alum & mag hydroxide-simeth (MAALOX/MYLANTA) 200-200-20 MG/5ML suspension 30 mL  30 mL Oral Q4H PRN Laveda Abbe, NP      . amLODipine (NORVASC) tablet 5 mg  5 mg Oral Daily Laveda Abbe, NP   5 mg at 09/06/17 0815  . ferrous sulfate tablet 325 mg  325 mg Oral QHS Laveda Abbe, NP   325 mg at 09/05/17 2149  . fluticasone furoate-vilanterol (BREO ELLIPTA) 100-25 MCG/INH 1 puff  1 puff Inhalation Daily Laveda Abbe, NP   1 puff at 09/06/17 1610   And  . umeclidinium bromide (INCRUSE ELLIPTA) 62.5 MCG/INH 1 puff  1 puff Inhalation Daily Laveda Abbe, NP      . gabapentin (NEURONTIN) capsule 600 mg  600 mg Oral TID Antonieta Pert, MD   600 mg at 09/06/17 9604  . hydrOXYzine (ATARAX/VISTARIL) tablet 100 mg  100 mg Oral TID Laveda Abbe, NP   100 mg at 09/06/17 0815  . hydrOXYzine (ATARAX/VISTARIL) tablet 25 mg  25 mg Oral TID PRN Laveda Abbe, NP   25 mg at 09/06/17 0311  . losartan (COZAAR) tablet 100 mg  100 mg Oral Daily Laveda Abbe, NP   100 mg at 09/06/17 0815  . magnesium hydroxide (MILK OF MAGNESIA) suspension 30 mL  30 mL Oral Daily PRN Laveda Abbe, NP      . Melatonin TABS 5 mg  5 mg Oral QHS Laveda Abbe, NP   5 mg at 09/05/17 2149  . OXcarbazepine (TRILEPTAL) tablet 150 mg  150 mg Oral BID Antonieta Pert, MD      . traZODone (DESYREL) tablet 100 mg  100 mg Oral QHS PRN,MR X 1 Money, Gerlene Burdock, FNP      . vortioxetine HBr (TRINTELLIX) tablet 10 mg  10 mg Oral QHS Antonieta Pert, MD   10 mg at 09/05/17 2149    Lab  Results: No results found for this or any previous visit (from the past 48 hour(s)).  Blood Alcohol level:  Lab Results  Component Value Date   ETH <10 09/02/2017   ETH <11 07/28/2011    Metabolic Disorder Labs: Lab Results  Component Value Date   HGBA1C 5.3 07/09/2016   No results found for: PROLACTIN Lab Results  Component Value Date   CHOL 137 08/09/2015   TRIG 46 08/09/2015   HDL 44 08/09/2015   LDLCALC 84 08/09/2015    Physical Findings: AIMS: Facial and Oral Movements Muscles of Facial Expression: None, normal Lips and Perioral Area: None, normal Jaw: None, normal Tongue: None, normal,Extremity Movements Upper (arms, wrists, hands, fingers): None, normal Lower (legs, knees, ankles, toes): None, normal, Trunk Movements Neck, shoulders, hips: None, normal, Overall Severity Severity of abnormal movements (highest score from questions above): None, normal Incapacitation due to abnormal movements: None, normal Patient's awareness of abnormal movements (rate only patient's report): No Awareness, Dental Status Current problems with teeth and/or dentures?: No Does patient usually wear dentures?: No  CIWA:    COWS:     Musculoskeletal: Strength & Muscle Tone: within normal limits Gait & Station: normal Patient leans: N/A  Psychiatric Specialty Exam: Physical Exam  Nursing note and vitals reviewed. Constitutional: He is oriented to person, place, and time. He appears well-developed and well-nourished.  HENT:  Head: Normocephalic and atraumatic.  Respiratory: Effort normal.  Neurological: He is alert and oriented to person, place, and time.    ROS  Blood pressure (!) 153/94, pulse 77,  temperature (!) 97.3 F (36.3 C), temperature source Oral, resp. rate 18, height 5\' 10"  (1.778 m), weight 117.9 kg (260 lb).Body mass index is 37.31 kg/m.  General Appearance: Casual  Eye Contact:  Fair  Speech:  Normal Rate  Volume:  Normal  Mood:  Anxious, Depressed and  Irritable  Affect:  Congruent  Thought Process:  Coherent  Orientation:  Full (Time, Place, and Person)  Thought Content:  Logical  Suicidal Thoughts:  No  Homicidal Thoughts:  No  Memory:  Immediate;   Fair Recent;   Fair Remote;   Fair  Judgement:  Intact  Insight:  Lacking  Psychomotor Activity:  Increased  Concentration:  Concentration: Fair and Attention Span: Fair  Recall:  [employer]Fair  Fund of Knowledge:  Good  Language:  Good  Akathisia:  Negative  Handed:  Right  AIMS (if indicated):     Assets:  Communication Skills Desire for Improvement Financial Resources/Insurance Housing Resilience Talents/Skills  ADL's:  Intact  Cognition:  WNL  Sleep:  Number of Hours: 6     Treatment Plan Summary: Daily contact with patient to assess and evaluate symptoms and progress in treatment, Medication management and Plan Patient is seen and examined.  Patient is a 62 year old male with the above-stated past psychiatric history seen in follow-up.  He is essentially unchanged.  He at least today denied suicidal ideation.  He attributes much of what is going on to his psychosocial situation.  He did state that last night he felt like he was going to "blow up".  He stated that was because of the noise of the television and on the unit.  He just wants to go someplace that is "quiet".  We discussed the possibility of at least talking to Dr. Toni Amendlapacs about the ECT.  He is willing to do that.  He would prefer to do that as an outpatient, but is willing to consider inpatient ECT if necessary.  We discussed increasing his Trileptal dosage to take off the irritability, and he is willing to do that as well.  His blood pressure remains mildly elevated, and I will increase his amlodipine to 10 mg p.o. daily..  We will also get an EKG tomorrow to check on his QTC prolongation which was noted on admission.  Antonieta PertGreg Lawson Merrell Borsuk, MD 09/06/2017, 10:56 AM

## 2017-09-07 DIAGNOSIS — Z87891 Personal history of nicotine dependence: Secondary | ICD-10-CM

## 2017-09-07 DIAGNOSIS — F411 Generalized anxiety disorder: Secondary | ICD-10-CM

## 2017-09-07 NOTE — [facility] Group Notes (Signed)
LCSW Group Therapy Note   09/07/2017   1:30PM  Type of Therapy/Topic: Group Therapy: Feelings about Diagnosis  Participation Level: Active   Description of Group:  This group will allow patients to explore their thoughts and feelings about diagnoses they have received. Patients will be guided to explore their level of understanding and acceptance of these diagnoses. Facilitator will encourage patients to process their thoughts and feelings about the reactions of others to their diagnosis and will guide patients in identifying ways to discuss their diagnosis with significant others in their lives. This group will be process-oriented, with patients participating in exploration of their own experiences, giving and receiving support, and processing challenge from other group members.  Therapeutic Goals: 1. Patient will demonstrate understanding of diagnosis as evidenced by identifying two or more symptoms of the disorder 2. Patient will be able to express two feelings regarding the diagnosis 3. Patient will demonstrate their ability to communicate their needs through discussion and/or role play  Summary of Patient Progress: Patient actively participated in group discussion today. He provided thoughtful input for another group member who is struggling with an addiction. Patient identified his own recovery efforts and acknowledged that he is not his issue. He struggled with identifying two positive traits that make him a unique individual, but he was able to name two traits.   Therapeutic Modalities:  Cognitive Behavioral Therapy Brief Therapy Feelings Identification    Roselyn Beringegina Valdemar Mcclenahan, MSW, LCSW Clinical Social Worker Phone: [phone]

## 2017-09-07 NOTE — Progress Notes (Signed)
Adult Psychoeducational Group Note  Date:  09/07/2017 Time:  3:46 AM  Group Topic/Focus:  Wrap-Up Group:   The focus of this group is to help patients review their daily goal of treatment and discuss progress on daily workbooks.  Participation Level:  Active  Participation Quality:  Appropriate  Affect:  Appropriate  Cognitive:  Alert  Insight: Appropriate  Engagement in Group:  Engaged  Modes of Intervention:  Discussion  Additional Comments:  Pt stated he had a great day but his visitor made it worse.  Pt rated the day at a 5/10.  Kyrsten Deleeuw 09/07/2017, 3:46 AM

## 2017-09-07 NOTE — Plan of Care (Signed)
Pt progressing in the following metrics  D: pt found in the dayroom interacting with his peers. Pt compliant with medication administration. Pt denies any si/hi/ah/vh and verbally agrees to approach staff if these become apparent. Pt rates his depression/hopelessness/anxiety a 6/7/6 out of 10 respectively. Pt states his goal for today is to work on his discharge plan. "I don't want to sound arrogant, but I think you all have helped me as much as you can here." pt still addiment about ECT even though his [facility]. "She always tries to tell me what to do". Pt states his visit with his wife didn't go that well. "they don't believe me". Pt had an episode during lunch where he had a sharp shooting pain through his chest. "it's happened before". BP assessed: 148/96, Pulse: 80. A: pt provided medications per protocol and standing orders. Pt provided support and encouragement. [id number] safety checks implemented and continued. R: pt safe on the unit. Will continue to monitor.   Problem: Education: Goal: Emotional status will improve Outcome: Progressing   Problem: Activity: Goal: Sleeping patterns will improve Outcome: Progressing   Problem: Coping: Goal: Ability to demonstrate self-control will improve Outcome: Progressing   Problem: Health Behavior/Discharge Planning: Goal: Identification of resources available to assist in meeting health care needs will improve Outcome: Progressing Goal: Compliance with treatment plan for underlying cause of condition will improve Outcome: Progressing   Problem: Safety: Goal: Periods of time without injury will increase Outcome: Progressing   Problem: Coping: Goal: Coping ability will improve Outcome: Progressing   Problem: Safety: Goal: Ability to disclose and discuss suicidal ideas will improve Outcome: Progressing   Problem: Medication: Goal: Compliance with prescribed medication regimen will improve Outcome: Progressing    Problem: Safety: Goal: Ability to remain free from injury will improve Outcome: Progressing

## 2017-09-07 NOTE — Progress Notes (Signed)
Pt had difficulty staying sleep around midnight.  He came to the nurse's station requesting ginger ale and asked if he could sit in the area in front of the NS for a few minutes.  Pt was allowed to sit, but after a few minutes, staff noticed pt with his head down as if he was asleep.  Writer went to him and encouraged him to go to bed as he was sitting up dozing.  Pt was hesitant, but did go back to his room.  Pt is in his room at this time asleep.

## 2017-09-07 NOTE — Progress Notes (Signed)
Nursing Progress Note: 7p-7a D: Pt currently presents with a depressed/anxious/pleasant on approach affect and behavior. Pt states "I feel like no one that has depression has ever been without suicidal thoughts. ECT really is the only thing that helps my depression." Interacting appropriately with the milieu. Pt reports good sleep during the previous night with current medication regimen. Pt did attend wrap-up group.  A: Pt provided with medications per providers orders. Pt's labs and vitals were monitored throughout the night. Pt supported emotionally and encouraged to express concerns and questions. Pt educated on medications.  R: Pt's safety ensured with 15 minute and environmental checks. Pt currently denies HI and AVH and endorses some intermittent passive SI. Pt verbally contracts to seek staff if SI,HI, or AVH occurs and to consult with staff before acting on any harmful thoughts. Will continue to monitor.

## 2017-09-07 NOTE — Progress Notes (Signed)
Pt has been frustrated and upset this evening after his wife came to visit this evening.  He said she came in and went straight to the nurse's station to tell staff that she felt ECT at [place] was not a good idea.  He said she then came over to where he was sitting and they talked, but he said it was mostly her telling him what he needs to do.  He says he would just like to go somewhere quiet by himself where he can think and process.  He does not feel she is supportive.  He said after he went to group, she call and told him she loved him and wanted to be supportive, but he feels the call was just to put him "on a guilt trip".  Writer spent time with pt letting him vent and talk about his visit.  He denies SI/HI/AVH.  He makes his needs known to staff.  He received some of his 1700 meds at bedtime with the hs meds per his requests.  Providers was made aware prior to shift change.  Pt also received Trazodone at that time.  Support and encouragement offered.  Discharge plans are in process.  Safety maintained with q15 minute checks.

## 2017-09-07 NOTE — Progress Notes (Signed)
Recreation Therapy Notes  Animal-Assisted Activity (AAA) Program Checklist/Progress Notes Patient Eligibility Criteria Checklist & Daily Group note for Rec Tx Intervention  Date: 7.23.19 Time: 1430 Location: 300 Hall Group Room   AAA/T Program Assumption of Risk Form signed by Patient/ or Parent Legal Guardian YES   Patient is free of allergies or sever asthma YES   Patient reports no fear of animals YES  Patient reports no history of cruelty to animals YES   Patient understands his/her participation is voluntary YES   Patient washes hands before animal contact YES   Patient washes hands after animal contact YES   Behavioral Response: Engaged  Education: Hand Washing, Appropriate Animal Interaction   Education Outcome: Acknowledges understanding/In group clarification offered/Needs additional education.   Clinical Observations/Feedback:  Pt attended and participated in activity.    [name], LRT/CTRS         [name] A 09/07/2017 3:41 PM 

## 2017-09-07 NOTE — Progress Notes (Signed)
[facility] MD Progress Note  09/07/2017 4:46 PM PERICLES CARMICHEAL  MRN:  [id number] Subjective:  Reports persistent depression, and states " I actually feel a little worse today". Denies suicidal ideations. States he is interested in ECT which he states helped in the past .  Denies medication side effects. Objective : Patient seen and case discussed with treatment team. 62 year old male, history of  major depression, posttraumatic stress disorder, generalized anxiety.  States depression and anxiety have been chronic and persistent , and states he had best results from ECT in the past. He is interested in being treated with ECT again, and Dr. Toni Amend from [facility] has reviewed chart and reported he is considered a good candidate for this treatment modality. Currently on Neurontin, Trileptal, Trintellix- denies medication side effects, but states " I don't think they are working for me ". He has been visible on unit, going to groups.  Denies current suicidal ideations, and contracts for safety on unit.  Denies psychotic symptoms. Presents fully alert and oriented x 3.   Principal Problem:  MDD, Severe Diagnosis:   Patient Active Problem List   Diagnosis Date Noted  . Severe recurrent major depression without psychotic features (HCC) [F33.2]   . Posttraumatic stress disorder [F43.10]   . Pneumonia [J18.9] 03/08/2017  . COPD with acute exacerbation (HCC) [J44.1] 01/19/2017  . OSA (obstructive sleep apnea) [G47.33] 01/11/2017  . Rash [R21] 01/11/2017  . Hyponatremia [E87.1] 10/13/2016  . Hypertension [I10] 07/09/2016  . History of adenomatous polyp of colon [Z86.010] 07/09/2016  . 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 Obesity, Class II, BMI 35-39.9 [E66.9] 07/09/2016  . Edema [R60.9] 07/09/2016  . COPD (chronic obstructive pulmonary disease) (HCC) [J44.9] 02/27/2015  . Dyspnea on exertion [R06.09] 01/18/2015  . Lung mass [R91.8] 10/10/2013  . Chronic maxillary sinusitis [J32.0] 09/27/2013  .  GERD (gastroesophageal reflux disease) [K21.9] 09/27/2013  . Chronic cough [R05] 06/21/2013  . Alcohol dependence (HCC) [F10.20] 07/29/2011  . Bipolar affective disorder, depressed, moderate (HCC) [F31.32] 07/29/2011    Class: Chronic   Total Time spent with patient: 20 minutes  Past Psychiatric History: See admission H&P  Past Medical History:  Past Medical History:  Diagnosis Date  . ADD (attention deficit disorder)   . Anxiety    Panic  . Bipolar disorder (HCC)   . GERD (gastroesophageal reflux disease)    ? they thought GERD caused cough.   . Mental disorder    ADD, bipolar  . Pneumonia 04/12/2013  . Shortness of breath   . Substance abuse [facility])     Past Surgical History:  Procedure Laterality Date  . APPENDECTOMY  1988  . COLONOSCOPY    . TONSILLECTOMY    . VIDEO BRONCHOSCOPY Bilateral 10/10/2013   Procedure: VIDEO BRONCHOSCOPY WITH FLUORO;  Surgeon: Oretha Milch, MD;  Location: [facility] ENDOSCOPY;  Service: Cardiopulmonary;  Laterality: Bilateral;  . VIDEO BRONCHOSCOPY N/A 10/13/2013   Procedure: VIDEO BRONCHOSCOPY, REMOVAL OF ENDOBRONCHIAL LESION, PROBABLE FOREIGN BODY;  Surgeon: Delight Ovens, MD;  Location: [facility] OR;  Service: Thoracic;  Laterality: N/A;  . VIDEO BRONCHOSCOPY N/A 11/28/2013   Procedure: VIDEO BRONCHOSCOPY;  Surgeon: Delight Ovens, MD;  Location[facility] OR;  Service: Thoracic;  Laterality: N/A;  bronchoscopy for removal of foreign body  . VIDEO BRONCHOSCOPY Bilateral 03/10/2017   Procedure: VIDEO BRONCHOSCOPY WITH FLUORO;  Surgeon: Oretha Milch, MD;  Location: [facility] ENDOSCOPY;  Service: Cardiopulmonary;  Laterality: Bilateral;   Family History:  Family History  Problem  Relation Age of Onset  . Breast cancer Mother   . Alcohol abuse Father        unclear cause of death. 24  . Colon cancer Father   . Alcohol abuse Sister        substance as well  . Alcohol abuse Brother        substance abuse  . Alcohol abuse Sister        substance abuse   Family  Psychiatric  History: See admission H&P Social History:  Social History   Substance and Sexual Activity  Alcohol Use No   Comment: none at all     Social History   Substance and Sexual Activity  Drug Use No    Social History   Socioeconomic History  . Marital status: Married    Spouse name: Not on file  . Number of children: Not on file  . Years of education: Not on file  . Highest education level: Not on file  Occupational History  . Not on file  Social Needs  . Financial resource strain: Not on file  . Food insecurity:    Worry: Not on file    Inability: Not on file  . Transportation needs:    Medical: Not on file    Non-medical: Not on file  Tobacco Use  . Smoking status: Former Smoker    Packs/day: 1.00    Years: 20.00    Pack years: 20.00    Types: Cigarettes    Last attempt to quit: 02/16/1993    Years since quitting: 24.5  . Smokeless tobacco: Never Used  Substance and Sexual Activity  . Alcohol use: No    Comment: none at all  . Drug use: No  . Sexual activity: Yes  Lifestyle  . Physical activity:    Days per week: Not on file    Minutes per session: Not on file  . Stress: Not on file  Relationships  . Social connections:    Talks on phone: Not on file    Gets together: Not on file    Attends religious service: Not on file    Active member of club or organization: Not on file    Attends meetings of clubs or organizations: Not on file    Relationship status: Not on file  Other Topics Concern  . Not on file  Social History Narrative   Married. Lives with wife. 2 children. Soon to be grandfather 07/2016.       Freelance work/semi retired. Editing/writing.       Hobbies: enjoys Diplomatic Services operational officer poetry   Additional Social History:   Sleep: Fair  Appetite:  Fair  Current Medications: Current Facility-Administered Medications  Medication Dose Route Frequency Provider Last Rate Last Dose  . acetaminophen (TYLENOL) tablet 650 mg  650 mg Oral Q6H PRN  Laveda Abbe, NP      . albuterol (PROVENTIL HFA;VENTOLIN HFA) 108 (90 Base) MCG/ACT inhaler 2 puff  2 puff Inhalation Q6H PRN Laveda Abbe, NP      . alum & mag hydroxide-simeth (MAALOX/MYLANTA) 200-200-20 MG/5ML suspension 30 mL  30 mL Oral Q4H PRN Laveda Abbe, NP      . amLODipine (NORVASC) tablet 10 mg  10 mg Oral Daily Antonieta Pert, MD   10 mg at 09/07/17 1610  . ferrous sulfate tablet 325 mg  325 mg Oral QHS Laveda Abbe, NP   325 mg at 09/06/17 2119  . fluticasone furoate-vilanterol (BREO ELLIPTA) 100-25 MCG/INH  1 puff  1 puff Inhalation Daily Laveda AbbeParks, Laurie Britton, NP   1 puff at 09/07/17 [id number]   And  . umeclidinium bromide (INCRUSE ELLIPTA) 62.5 MCG/INH 1 puff  1 puff Inhalation Daily Laveda AbbeParks, Laurie Britton, NP   1 puff at 09/07/17 [phone]  . gabapentin (NEURONTIN) capsule 600 mg  600 mg Oral TID Antonieta Pertlary, Greg Lawson, MD   600 mg at 09/07/17 1259  . hydrOXYzine (ATARAX/VISTARIL) tablet 100 mg  100 mg Oral TID Laveda AbbeParks, Laurie Britton, NP   100 mg at 09/07/17 1259  . hydrOXYzine (ATARAX/VISTARIL) tablet 25 mg  25 mg Oral TID PRN Laveda AbbeParks, Laurie Britton, NP   25 mg at 09/06/17 0311  . losartan (COZAAR) tablet 100 mg  100 mg Oral Daily Laveda AbbeParks, Laurie Britton, NP   100 mg at 09/07/17 [id number]  . magnesium hydroxide (MILK OF MAGNESIA) suspension 30 mL  30 mL Oral Daily PRN Laveda AbbeParks, Laurie Britton, NP      . Melatonin TABS 5 mg  5 mg Oral QHS Laveda AbbeParks, Laurie Britton, NP   5 mg at 09/06/17 2119  . OXcarbazepine (TRILEPTAL) tablet 150 mg  150 mg Oral BID Antonieta Pertlary, Greg Lawson, MD   150 mg at 09/07/17 0807  . traZODone (DESYREL) tablet 100 mg  100 mg Oral QHS PRN,MR X 1 Money, Travis B, FNP   100 mg at 09/06/17 2119  . vortioxetine HBr (TRINTELLIX) tablet 10 mg  10 mg Oral QHS Antonieta Pertlary, Greg Lawson, MD   10 mg at 09/06/17 2119    Lab Results: No results found for this or any previous visit (from the past 48 hour(s)).  Blood Alcohol level:  Lab Results  Component Value Date    ETH <10 09/02/2017   ETH <11 07/28/2011    Metabolic Disorder Labs: Lab Results  Component Value Date   HGBA1C 5.3 07/09/2016   No results found for: PROLACTIN Lab Results  Component Value Date   CHOL 137 08/09/2015   TRIG 46 08/09/2015   HDL 44 08/09/2015   LDLCALC 84 08/09/2015    Physical Findings: AIMS: Facial and Oral Movements Muscles of Facial Expression: None, normal Lips and Perioral Area: None, normal Jaw: None, normal Tongue: None, normal,Extremity Movements Upper (arms, wrists, hands, fingers): None, normal Lower (legs, knees, ankles, toes): None, normal, Trunk Movements Neck, shoulders, hips: None, normal, Overall Severity Severity of abnormal movements (highest score from questions above): None, normal Incapacitation due to abnormal movements: None, normal Patient's awareness of abnormal movements (rate only patient's report): No Awareness, Dental Status Current problems with teeth and/or dentures?: No Does patient usually wear dentures?: No  CIWA:    COWS:     Musculoskeletal: Strength & Muscle Tone: within normal limits Gait & Station: normal Patient leans: N/A  Psychiatric Specialty Exam: Physical Exam  Nursing note and vitals reviewed. Constitutional: He is oriented to person, place, and time. He appears well-developed and well-nourished.  HENT:  Head: Normocephalic and atraumatic.  Respiratory: Effort normal.  Neurological: He is alert and oriented to person, place, and time.    ROS reports headache, no chest pain, no shortness of breath, no vomiting , no rash  Blood pressure (!) 146/96, pulse 66, temperature 97.8 F (36.6 C), temperature source Oral, resp. rate 20, height 5\' 10"  (1.778 m), weight 117.9 kg (260 lb).Body mass index is 37.31 kg/m.  General Appearance: Fairly Groomed  Eye Contact:  Fair  Speech:  Normal Rate  Volume:  Normal  Mood:  remains depressed, vaguely dysphoric  Affect:  Congruent  Thought Process:  Linear and  Descriptions of Associations: Intact  Orientation:  Other:  fully alert, attentive, oriented x 3  Thought Content:  no hallucinations, no delusions, not internally preoccupied  Suicidal Thoughts:  No denies suicidal or self injurious ideations, denies homicidal or violent ideations  Homicidal Thoughts:  No  Memory:  recent and remote grossly intact   Judgement:  Fair  Insight:  Fair  Psychomotor Activity:  Normal  Concentration:  Concentration: Good and Attention Span: Good  Recall:  Good  Fund of Knowledge:  Good  Language:  Good  Akathisia:  Negative  Handed:  Right  AIMS (if indicated):     Assets:  Communication Skills Desire for Improvement Financial Resources/Insurance Housing Resilience Talents/Skills  ADL's:  Intact  Cognition:  WNL  Sleep:  Number of Hours: 5.75    Assessment-  62 year old married male, reports long history of depression, anxiety. States that depression has been persistent, chronic, and feels that he has not responded well to medications. States he does feel ECT has been helpful in the past, and is interested in retrying this treatment modality. His case has been evaluated by Dr. Toni Amend at [facility] and is considered to be a good candidate for ECT .   Treatment Plan Summary: Treatment Plan reviewed as below today 7/23  Encourage group and milieu participation to work on coping skills and symptom reduction Treatment team working on disposition planning options- patient interested in inpatient ECT . Team working on possible referral to inpatient ECT treatment at [facility] under the care of Dr. Toni Amend  Continue Trileptal 150 mgrs BID for mood disorder  Continue Neurontin 600 mgrs TID for pain, anxiety Continue TYrintelix 10 mgr QDAY for depression Continue Trazodone 100 mgrs QHS PRN for insomnia as needed  Continue Melatonin 5 mgr QHS for insomnia Continue Vistaril 25 mgrs Q 8 hours PRN for anxiety as needed  Continue Cozaar and Norvasc  for HTN     Craige Cotta, MD 09/07/2017, 4:46 PM   Patient ID: [name], male   DOB: 1955-03-15, 63 y.o.   MRN: [id number]

## 2017-09-08 MED ORDER — HYDROXYZINE HCL 50 MG PO TABS
100.0000 mg | ORAL_TABLET | Freq: Three times a day (TID) | ORAL | Status: DC
  Administered 2017-09-08 – 2017-09-09 (×3): 100 mg via ORAL
  Filled 2017-09-08 (×8): qty 2

## 2017-09-08 MED ORDER — HYDROCHLOROTHIAZIDE 12.5 MG PO CAPS
12.5000 mg | ORAL_CAPSULE | Freq: Every day | ORAL | Status: DC
  Administered 2017-09-08 – 2017-09-09 (×2): 12.5 mg via ORAL
  Filled 2017-09-08 (×4): qty 1

## 2017-09-08 MED ORDER — TRAZODONE HCL 50 MG PO TABS
50.0000 mg | ORAL_TABLET | Freq: Once | ORAL | Status: AC
  Administered 2017-09-08: 50 mg via ORAL
  Filled 2017-09-08 (×2): qty 1

## 2017-09-08 MED ORDER — HYDROXYZINE HCL 50 MG PO TABS
100.0000 mg | ORAL_TABLET | Freq: Two times a day (BID) | ORAL | Status: DC
  Filled 2017-09-08 (×4): qty 2

## 2017-09-08 MED ORDER — HYDROXYZINE HCL 50 MG PO TABS
100.0000 mg | ORAL_TABLET | Freq: Three times a day (TID) | ORAL | Status: DC
  Filled 2017-09-08 (×2): qty 2

## 2017-09-08 MED ORDER — TRAZODONE HCL 100 MG PO TABS: 100.0000 mg | ORAL_TABLET | Freq: Every evening | ORAL | Status: DC | PRN

## 2017-09-08 NOTE — Progress Notes (Signed)
CSW spoke to Dr Toni Amendlapacs regarding transfer for ECT.  He is able to take pt tomorrow, 7/25, with plan to start ECT on Friday, 7/26.  [place] in TTS contacted and updated. Garner NashGregory Dalene Robards, MSW, LCSW Clinical Social Worker 09/08/2017 9:49 AM

## 2017-09-08 NOTE — Progress Notes (Signed)
Recreation Therapy Notes  Date: 7.24.19 Time: 0930 Location: 300 Hall Dayroom  Group Topic: Stress Management  Goal Area(s) Addresses:  Patient will verbalize importance of using healthy stress management.  Patient will identify positive emotions associated with healthy stress management.   Intervention: Stress Management  Activity : Meditation.  LRT introduced the stress management technique of meditation.  LRT played a meditation that focused on choices.  Patients were to follow along as meditation was read to fully engage in the activity.  Education:  Stress Management, Discharge Planning.   Education Outcome: Acknowledges edcuation/In group clarification offered/Needs additional education  Clinical Observations/Feedback: Pt did not attend group.    Caroll RancherMarjette Loreen Bankson, LRT/CTRS         Lillia AbedLindsay, Keili Hasten A 09/08/2017 10:57 AM

## 2017-09-08 NOTE — [facility] Suicide Risk Assessment (Signed)
[facility] INPATIENT:  Family/Significant Other Suicide Prevention Education  Suicide Prevention Education:  Education Completed; [name], wife, [phone], has been identified by the patient as the family member/significant other with whom the patient will be residing, and identified as the person(s) who will aid the patient in the event of a mental health crisis (suicidal ideations/suicide attempt).  With written consent from the patient, the family member/significant other has been provided the following suicide prevention education, prior to the and/or following the discharge of the patient.  The suicide prevention education provided includes the following:  Suicide risk factors  Suicide prevention and interventions  National Suicide Hotline telephone number  [facility] assessment telephone number  [facility] Emergency Assistance 911  [facility] and/or [facility]idential [facility] Crisis [facility] telephone number  Request made of family/significant other to:  Remove weapons (e.g., guns, rifles, knives), all items previously/currently identified as safety concern.  No guns in the home.   Remove drugs/medications (over-the-counter, prescriptions, illicit drugs), all items previously/currently identified as a safety concern.  The family member/significant other verbalizes understanding of the suicide prevention education information provided.  The family member/significant other agrees to remove the items of safety concern listed above.  Pt had not been communicating a lot with family.  Seemed OK, then just checked himself into the hospital.    [name], Zachary Speciale [name], LCSW 09/08/2017, 10:16 AM

## 2017-09-08 NOTE — Progress Notes (Addendum)
[facility] MD Progress Note  09/08/2017 1:20 PM [name]  MRN:  [id number] Subjective: Reports persistent depression which she characterizes as chronic.  Denies suicidal ideation at this time.  Remains interested in getting ECT , and is wanting to transfer to [name] to initiate said treatment soon.  States "I feel like I am just wasting time here".  He denies medication side effects.   Objective : Patient seen and case discussed with treatment team. 62 year old male, history of  major depression, posttraumatic stress disorder, generalized anxiety.  Reports depression as chronic/persistent and describes history of prior positive results from ECT in the past. As above, currently wanting to restart an ECT course, and has been approved for this treatment modality by Dr. Toni Amend at [name] [place] Denies medication side effects. Denies suicidal ideations.  Contracts for safety on unit. No disruptive behaviors on unit, going to some groups.  Principal Problem:  MDD, Severe Diagnosis:   Patient Active Problem List   Diagnosis Date Noted  . Severe recurrent major depression without psychotic features (HCC) [F33.2]   . Posttraumatic stress disorder [F43.10]   . Pneumonia [J18.9] 03/08/2017  . COPD with acute exacerbation (HCC) [J44.1] 01/19/2017  . OSA (obstructive sleep apnea) [G47.33] 01/11/2017  . Rash [R21] 01/11/2017  . Hyponatremia [E87.1] 10/13/2016  . Hypertension [I10] 07/09/2016  . History of adenomatous polyp of colon [Z86.010] 07/09/2016  . 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 Obesity, Class II, BMI 35-39.9 [E66.9] 07/09/2016  . Edema [R60.9] 07/09/2016  . COPD (chronic obstructive pulmonary disease) (HCC) [J44.9] 02/27/2015  . Dyspnea on exertion [R06.09] 01/18/2015  . Lung mass [R91.8] 10/10/2013  . Chronic maxillary sinusitis [J32.0] 09/27/2013  . GERD (gastroesophageal reflux disease) [K21.9] 09/27/2013  . Chronic cough [R05] 06/21/2013  . Alcohol dependence (HCC)  [F10.20] 07/29/2011  . Bipolar affective disorder, depressed, moderate (HCC) [F31.32] 07/29/2011    Class: Chronic   Total Time spent with patient: 20 minutes  Past Psychiatric History: See admission H&P  Past Medical History:  Past Medical History:  Diagnosis Date  . ADD (attention deficit disorder)   . Anxiety    Panic  . Bipolar disorder (HCC)   . GERD (gastroesophageal reflux disease)    ? they thought GERD caused cough.   . Mental disorder    ADD, bipolar  . Pneumonia 04/12/2013  . Shortness of breath   . Substance abuse [facility])     Past Surgical History:  Procedure Laterality Date  . APPENDECTOMY  1988  . COLONOSCOPY    . TONSILLECTOMY    . VIDEO BRONCHOSCOPY Bilateral 10/10/2013   Procedure: VIDEO BRONCHOSCOPY WITH FLUORO;  Surgeon: Oretha Milch, MD;  Location: [facility] ENDOSCOPY;  Service: Cardiopulmonary;  Laterality: Bilateral;  . VIDEO BRONCHOSCOPY N/A 10/13/2013   Procedure: VIDEO BRONCHOSCOPY, REMOVAL OF ENDOBRONCHIAL LESION, PROBABLE FOREIGN BODY;  Surgeon: Delight Ovens, MD;  Location: [facility] OR;  Service: Thoracic;  Laterality: N/A;  . VIDEO BRONCHOSCOPY N/A 11/28/2013   Procedure: VIDEO BRONCHOSCOPY;  Surgeon: Delight Ovens, MD;  Location: [name] OR;  Service: Thoracic;  Laterality: N/A;  bronchoscopy for removal of foreign body  . VIDEO BRONCHOSCOPY Bilateral 03/10/2017   Procedure: VIDEO BRONCHOSCOPY WITH FLUORO;  Surgeon: Oretha Milch, MD;  Location: [facility] ENDOSCOPY;  Service: Cardiopulmonary;  Laterality: Bilateral;   Family History:  Family History  Problem Relation Age of Onset  . Breast cancer Mother   . Alcohol abuse Father        unclear cause of  death. 22  . Colon cancer Father   . Alcohol abuse Sister        substance as well  . Alcohol abuse Brother        substance abuse  . Alcohol abuse Sister        substance abuse   Family Psychiatric  History: See admission H&P Social History:  Social History   Substance and Sexual Activity  Alcohol Use No    Comment: none at all     Social History   Substance and Sexual Activity  Drug Use No    Social History   Socioeconomic History  . Marital status: Married    Spouse name: Not on file  . Number of children: Not on file  . Years of education: Not on file  . Highest education level: Not on file  Occupational History  . Not on file  Social Needs  . Financial resource strain: Not on file  . Food insecurity:    Worry: Not on file    Inability: Not on file  . Transportation needs:    Medical: Not on file    Non-medical: Not on file  Tobacco Use  . Smoking status: Former Smoker    Packs/day: 1.00    Years: 20.00    Pack years: 20.00    Types: Cigarettes    Last attempt to quit: 02/16/1993    Years since quitting: 24.5  . Smokeless tobacco: Never Used  Substance and Sexual Activity  . Alcohol use: No    Comment: none at all  . Drug use: No  . Sexual activity: Yes  Lifestyle  . Physical activity:    Days per week: Not on file    Minutes per session: Not on file  . Stress: Not on file  Relationships  . Social connections:    Talks on phone: Not on file    Gets together: Not on file    Attends religious service: Not on file    Active member of club or organization: Not on file    Attends meetings of clubs or organizations: Not on file    Relationship status: Not on file  Other Topics Concern  . Not on file  Social History Narrative   Married. Lives with wife. 2 children. Soon to be grandfather 07/2016.       Freelance work/semi retired. Editing/writing.       Hobbies: enjoys Diplomatic Services operational officer poetry   Additional Social History:   Sleep: Fair  Appetite:  Fair  Current Medications: Current Facility-Administered Medications  Medication Dose Route Frequency Provider Last Rate Last Dose  . acetaminophen (TYLENOL) tablet 650 mg  650 mg Oral Q6H PRN Laveda Abbe, NP      . albuterol (PROVENTIL HFA;VENTOLIN HFA) 108 (90 Base) MCG/ACT inhaler 2 puff  2 puff Inhalation  Q6H PRN Laveda Abbe, NP      . alum & mag hydroxide-simeth (MAALOX/MYLANTA) 200-200-20 MG/5ML suspension 30 mL  30 mL Oral Q4H PRN Laveda Abbe, NP      . amLODipine (NORVASC) tablet 10 mg  10 mg Oral Daily Antonieta Pert, MD   10 mg at 09/08/17 0735  . ferrous sulfate tablet 325 mg  325 mg Oral QHS Laveda Abbe, NP   325 mg at 09/07/17 2205  . fluticasone furoate-vilanterol (BREO ELLIPTA) 100-25 MCG/INH 1 puff  1 puff Inhalation Daily Laveda Abbe, NP   1 puff at 09/08/17 0734   And  . umeclidinium bromide (  INCRUSE ELLIPTA) 62.5 MCG/INH 1 puff  1 puff Inhalation Daily Laveda Abbe, NP   1 puff at 09/08/17 0734  . gabapentin (NEURONTIN) capsule 600 mg  600 mg Oral TID Antonieta Pert, MD   600 mg at 09/08/17 1147  . hydrOXYzine (ATARAX/VISTARIL) tablet 100 mg  100 mg Oral BID Cobos, Rockey Situ, MD      . hydrOXYzine (ATARAX/VISTARIL) tablet 25 mg  25 mg Oral TID PRN Laveda Abbe, NP   25 mg at 09/06/17 0311  . losartan (COZAAR) tablet 100 mg  100 mg Oral Daily Laveda Abbe, NP   100 mg at 09/08/17 0734  . magnesium hydroxide (MILK OF MAGNESIA) suspension 30 mL  30 mL Oral Daily PRN Laveda Abbe, NP      . Melatonin TABS 5 mg  5 mg Oral QHS Laveda Abbe, NP   5 mg at 09/07/17 2206  . OXcarbazepine (TRILEPTAL) tablet 150 mg  150 mg Oral BID Antonieta Pert, MD   150 mg at 09/08/17 0735  . traZODone (DESYREL) tablet 100 mg  100 mg Oral QHS PRN,MR X 1 Money, Travis B, FNP   100 mg at 09/07/17 2209  . vortioxetine HBr (TRINTELLIX) tablet 10 mg  10 mg Oral QHS Antonieta Pert, MD   10 mg at 09/07/17 2209    Lab Results: No results found for this or any previous visit (from the past 48 hour(s)).  Blood Alcohol level:  Lab Results  Component Value Date   ETH <10 09/02/2017   ETH <11 07/28/2011    Metabolic Disorder Labs: Lab Results  Component Value Date   HGBA1C 5.3 07/09/2016   No results found  for: PROLACTIN Lab Results  Component Value Date   CHOL 137 08/09/2015   TRIG 46 08/09/2015   HDL 44 08/09/2015   LDLCALC 84 08/09/2015    Physical Findings: AIMS: Facial and Oral Movements Muscles of Facial Expression: None, normal Lips and Perioral Area: None, normal Jaw: None, normal Tongue: None, normal,Extremity Movements Upper (arms, wrists, hands, fingers): None, normal Lower (legs, knees, ankles, toes): None, normal, Trunk Movements Neck, shoulders, hips: None, normal, Overall Severity Severity of abnormal movements (highest score from questions above): None, normal Incapacitation due to abnormal movements: None, normal Patient's awareness of abnormal movements (rate only patient's report): No Awareness, Dental Status Current problems with teeth and/or dentures?: No Does patient usually wear dentures?: No  CIWA:  CIWA-Ar Total: 1 COWS:  COWS Total Score: 2  Musculoskeletal: Strength & Muscle Tone: within normal limits Gait & Station: normal Patient leans: N/A  Psychiatric Specialty Exam: Physical Exam  Nursing note and vitals reviewed. Constitutional: He is oriented to person, place, and time. He appears well-developed and well-nourished.  HENT:  Head: Normocephalic and atraumatic.  Respiratory: Effort normal.  Neurological: He is alert and oriented to person, place, and time.    ROS describes mild headache, no chest pain, no shortness of breath, no vomiting  Blood pressure (!) 138/99, pulse 84, temperature 97.8 F (36.6 C), temperature source Oral, resp. rate 18, height 5\' 10"  (1.778 m), weight 117.9 kg (260 lb).Body mass index is 37.31 kg/m.  General Appearance: Fairly Groomed  Eye Contact:  Good  Speech:  Normal Rate  Volume:  Normal  Mood:  Depressed  Affect:  Congruent, less irritable today  Thought Process:  Linear and Descriptions of Associations: Intact  Orientation:  Other:  fully alert, attentive, oriented x 3  Thought Content:  no  hallucinations,  no delusions, not internally preoccupied  Suicidal Thoughts:  No denies suicidal or self injurious ideations, denies homicidal or violent ideations  Homicidal Thoughts:  No  Memory:  recent and remote grossly intact   Judgement:  Other:  Improving  Insight:  Present  Psychomotor Activity:  Normal  Concentration:  Concentration: Good and Attention Span: Good  Recall:  Good  Fund of Knowledge:  Good  Language:  Good  Akathisia:  Negative  Handed:  Right  AIMS (if indicated):     Assets:  Communication Skills Desire for Improvement Financial Resources/Insurance Housing Resilience Talents/Skills  ADL's:  Intact  Cognition:  WNL  Sleep:  Number of Hours: 6.25    Assessment-  62 year old married male, reports long history of depression, anxiety.  Depression is chronic, persistent, and he states he has achieved the best results from ECT in the past.  He is interested in restarting a course of ECT, and case has been reviewed by Dr. Toni Amendlapacs at [facility] for this purpose.  Tolerating medications well.  Denies suicidal ideations at this time.  Treatment Plan Summary: Treatment Plan reviewed as below today 7/24 Encourage group and milieu participation to work on coping skills and symptom reduction Treatment team working on disposition planning options-informed by CSW that patient will most likely have a bed available at [name] tomorrow .  Continue Trileptal 150 mgrs BID for mood disorder  Continue Neurontin 600 mgrs TID for pain, anxiety Continue Trintelix 10 mgr QDAY for depression  Continue Melatonin 5 mgr QHS for insomnia Continue Vistaril 25 mgrs Q 8 hours PRN for anxiety as needed  Continue Cozaar and Norvasc for HTN - have discussed with hospitalist consultant regarding antihypertensive dose adjustment due to persistently  elevated BP readings .Recommendation is to add HCTZ 12.5 mgrs QDAY . Will recheck BMP in AM.  *Patient reports he has been on Hydroxyzine 100 mgrs TID for  anxiety for years without side effects- we discussed initiating a gradual taper, but he reported feeling  anxious related to decreasing dose at this time, worried that he will be unable to sleep ff dose is lowered. He prefers to continue current dose and reconsider a gradual taper once further stabilized and receiving ECT treatment.    Craige CottaFernando A Cobos, MD 09/08/2017, 1:20 PM   Patient ID: [name], male   DOB: 10/30/1955, 62 y.o.   MRN: [id number]

## 2017-09-08 NOTE — [facility] Group Notes (Signed)
[facility] 09/08/2017 1:15pm  Type of Therapy: Mental Health Association Presentation  Participation Level: Active  Participation Quality: Attentive  Affect: Appropriate  Cognitive: Oriented  Insight: Developing/Improving  Engagement in Therapy: Engaged  Modes of Intervention: Discussion, Education and Socialization  Summary of Progress/Problems: Mental Health Association (MHA) Speaker came to talk about his personal journey with mental health. The pt processed ways by which to relate to the speaker. MHA speaker provided handouts and educational information pertaining to groups and services offered by the [facility]. Pt was engaged in speaker's presentation and was receptive to resources provided.    Lorri FrederickWierda, Ewen Varnell [name], LCSW 09/08/2017 12:18 PM

## 2017-09-08 NOTE — Progress Notes (Signed)
Nursing Progress Note: 7p-7a D: Pt currently presents with a anxious/sad/depressed affect and behavior. Pt states "I want to start going to NA and AA groups. I think that would be really helpful." Interacting appropriately with the milieu. Pt reports good sleep during the previous night with current medication regimen. Pt did attend wrap-up group.  A: Pt provided with medications per providers orders. Pt's labs and vitals were monitored throughout the night. Pt supported emotionally and encouraged to express concerns and questions. Pt educated on medications.  R: Pt's safety ensured with 15 minute and environmental checks. Pt currently denies SI, HI, and AVH. Pt verbally contracts to seek staff if SI,HI, or AVH occurs and to consult with staff before acting on any harmful thoughts. Will continue to monitor.

## 2017-09-08 NOTE — Progress Notes (Signed)
Patient attended N/A group meeting tonight. 
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
## 2017-09-08 NOTE — Plan of Care (Signed)
Nurse discussed depression, anxiety, coping skills with patient.  

## 2017-09-08 NOTE — [facility] Group Notes (Addendum)
[facility] Group Notes:  (Nursing/MHT/Case Management/Adjunct)  Date:  09/08/2017  Time:  1315  Type of Therapy:  Psychoeducational Skills  Participation Level:  Active  Participation Quality:  Appropriate  Affect:  Appropriate  Cognitive:  Appropriate  Insight:  Appropriate  Engagement in Group:  Engaged  Modes of Intervention:  Discussion and Education  Summary of Progress/Problems:Patient participated appropriately in group.  Earline MayotteKnight, Kada Friesen Shephard 09/08/2017, 2:15 PM

## 2017-09-09 ENCOUNTER — Other Ambulatory Visit: Payer: Self-pay | Admitting: Psychiatry

## 2017-09-09 ENCOUNTER — Inpatient Hospital Stay
Admission: AD | Admit: 2017-09-09 | Discharge: 2017-09-13 | DRG: 885 | Disposition: A | Discharge status: Home / Self Care | Payer: BLUE CROSS/BLUE SHIELD | Source: Intra-hospital | Attending: Psychiatry | Admitting: Psychiatry

## 2017-09-09 DIAGNOSIS — F332 Major depressive disorder, recurrent severe without psychotic features: Secondary | ICD-10-CM | POA: Diagnosis present

## 2017-09-09 DIAGNOSIS — F410 Panic disorder [episodic paroxysmal anxiety] without agoraphobia: Secondary | ICD-10-CM | POA: Diagnosis present

## 2017-09-09 DIAGNOSIS — F4310 Post-traumatic stress disorder, unspecified: Secondary | ICD-10-CM | POA: Diagnosis not present

## 2017-09-09 DIAGNOSIS — Z818 Family history of other mental and behavioral disorders: Secondary | ICD-10-CM

## 2017-09-09 DIAGNOSIS — Z7951 Long term (current) use of inhaled steroids: Secondary | ICD-10-CM | POA: Diagnosis not present

## 2017-09-09 DIAGNOSIS — Z79899 Other long term (current) drug therapy: Secondary | ICD-10-CM | POA: Diagnosis not present

## 2017-09-09 DIAGNOSIS — F411 Generalized anxiety disorder: Secondary | ICD-10-CM | POA: Diagnosis not present

## 2017-09-09 DIAGNOSIS — Z811 Family history of alcohol abuse and dependence: Secondary | ICD-10-CM

## 2017-09-09 DIAGNOSIS — Z5329 Procedure and treatment not carried out because of patient's decision for other reasons: Secondary | ICD-10-CM | POA: Diagnosis not present

## 2017-09-09 DIAGNOSIS — R200 Anesthesia of skin: Secondary | ICD-10-CM

## 2017-09-09 DIAGNOSIS — Z915 Personal history of self-harm: Secondary | ICD-10-CM

## 2017-09-09 DIAGNOSIS — Z8659 Personal history of other mental and behavioral disorders: Secondary | ICD-10-CM | POA: Diagnosis not present

## 2017-09-09 DIAGNOSIS — T1491XA Suicide attempt, initial encounter: Secondary | ICD-10-CM

## 2017-09-09 DIAGNOSIS — J449 Chronic obstructive pulmonary disease, unspecified: Secondary | ICD-10-CM | POA: Diagnosis present

## 2017-09-09 DIAGNOSIS — Z888 Allergy status to other drugs, medicaments and biological substances status: Secondary | ICD-10-CM | POA: Diagnosis not present

## 2017-09-09 DIAGNOSIS — R45851 Suicidal ideations: Secondary | ICD-10-CM | POA: Diagnosis present

## 2017-09-09 DIAGNOSIS — I10 Essential (primary) hypertension: Secondary | ICD-10-CM | POA: Diagnosis present

## 2017-09-09 DIAGNOSIS — Z87891 Personal history of nicotine dependence: Secondary | ICD-10-CM

## 2017-09-09 LAB — BASIC METABOLIC PANEL WITH GFR
Anion gap: 10 (ref 5–15)
BUN: 13 mg/dL (ref 8–23)
CO2: 25 mmol/L (ref 22–32)
Calcium: 9 mg/dL (ref 8.9–10.3)
Chloride: 96 mmol/L — ABNORMAL LOW (ref 98–111)
Creatinine, Ser: 0.83 mg/dL (ref 0.61–1.24)
GFR calc Af Amer: >60 mL/min
GFR calc non Af Amer: >60 mL/min
Glucose, Bld: 110 mg/dL — ABNORMAL HIGH (ref 70–99)
Potassium: 4 mmol/L (ref 3.5–5.1)
Sodium: 131 mmol/L — ABNORMAL LOW (ref 135–145)

## 2017-09-09 MED ORDER — GABAPENTIN 300 MG PO CAPS: 600.0000 mg | ORAL_CAPSULE | Freq: Three times a day (TID) | ORAL | Status: AC

## 2017-09-09 MED ORDER — ACETAMINOPHEN 325 MG PO TABS: 650.0000 mg | ORAL_TABLET | Freq: Four times a day (QID) | ORAL | Status: DC | PRN

## 2017-09-09 MED ORDER — UMECLIDINIUM BROMIDE 62.5 MCG/INH IN AEPB: 1.0000 | INHALATION_SPRAY | Freq: Every day | RESPIRATORY_TRACT | Status: DC

## 2017-09-09 MED ORDER — AMLODIPINE BESYLATE 5 MG PO TABS
10.0000 mg | ORAL_TABLET | Freq: Every day | ORAL | Status: DC
  Administered 2017-09-10 – 2017-09-13 (×4): 10 mg via ORAL
  Filled 2017-09-09 (×4): qty 2

## 2017-09-09 MED ORDER — FERROUS SULFATE 325 (65 FE) MG PO TABS
325.0000 mg | ORAL_TABLET | Freq: Every day | ORAL | Status: DC
  Administered 2017-09-09 – 2017-09-12 (×4): 325 mg via ORAL
  Filled 2017-09-09 (×4): qty 1

## 2017-09-09 MED ORDER — HYDROCHLOROTHIAZIDE 12.5 MG PO CAPS: 12.5000 mg | ORAL_CAPSULE | Freq: Every day | ORAL | Status: DC

## 2017-09-09 MED ORDER — FLUTICASONE FUROATE-VILANTEROL 100-25 MCG/INH IN AEPB: 1.0000 | INHALATION_SPRAY | Freq: Every day | RESPIRATORY_TRACT | Status: DC

## 2017-09-09 MED ORDER — ALUM & MAG HYDROXIDE-SIMETH 200-200-20 MG/5ML PO SUSP: 30.0000 mL | ORAL | Status: DC | PRN

## 2017-09-09 MED ORDER — VORTIOXETINE HBR 10 MG PO TABS: 10.0000 mg | ORAL_TABLET | Freq: Every day | ORAL | 0 refills | Status: DC

## 2017-09-09 MED ORDER — HYDROXYZINE HCL 25 MG PO TABS: 25.0000 mg | ORAL_TABLET | Freq: Three times a day (TID) | ORAL | 0 refills | Status: DC | PRN

## 2017-09-09 MED ORDER — MAGNESIUM HYDROXIDE 400 MG/5ML PO SUSP: 30.0000 mL | Freq: Every day | ORAL | 0 refills | Status: DC | PRN

## 2017-09-09 MED ORDER — LOSARTAN POTASSIUM 50 MG PO TABS
100.0000 mg | ORAL_TABLET | Freq: Every day | ORAL | Status: DC
  Administered 2017-09-10 – 2017-09-13 (×4): 100 mg via ORAL
  Filled 2017-09-09 (×5): qty 2

## 2017-09-09 MED ORDER — HYDROCHLOROTHIAZIDE 12.5 MG PO CAPS
12.5000 mg | ORAL_CAPSULE | Freq: Every day | ORAL | Status: DC
  Administered 2017-09-10 – 2017-09-12 (×3): 12.5 mg via ORAL
  Filled 2017-09-09 (×3): qty 1

## 2017-09-09 MED ORDER — AMLODIPINE BESYLATE 10 MG PO TABS: 10.0000 mg | ORAL_TABLET | Freq: Every day | ORAL | Status: DC

## 2017-09-09 MED ORDER — LOSARTAN POTASSIUM 100 MG PO TABS: 100.0000 mg | ORAL_TABLET | Freq: Every day | ORAL | 0 refills | Status: DC

## 2017-09-09 MED ORDER — ALBUTEROL SULFATE (2.5 MG/3ML) 0.083% IN NEBU: 2.5000 mg | INHALATION_SOLUTION | Freq: Four times a day (QID) | RESPIRATORY_TRACT | 12 refills | Status: AC | PRN

## 2017-09-09 MED ORDER — MAGNESIUM HYDROXIDE 400 MG/5ML PO SUSP: 30.0000 mL | Freq: Every day | ORAL | Status: DC | PRN

## 2017-09-09 MED ORDER — OXCARBAZEPINE 150 MG PO TABS: 150.0000 mg | ORAL_TABLET | Freq: Two times a day (BID) | ORAL | Status: DC

## 2017-09-09 MED ORDER — UMECLIDINIUM BROMIDE 62.5 MCG/INH IN AEPB
1.0000 | INHALATION_SPRAY | Freq: Every day | RESPIRATORY_TRACT | Status: DC
  Administered 2017-09-10 – 2017-09-13 (×5): 1 via RESPIRATORY_TRACT
  Filled 2017-09-09: qty 7

## 2017-09-09 MED ORDER — FLUTICASONE FUROATE-VILANTEROL 100-25 MCG/INH IN AEPB
1.0000 | INHALATION_SPRAY | Freq: Every day | RESPIRATORY_TRACT | Status: DC
  Administered 2017-09-10 – 2017-09-13 (×4): 1 via RESPIRATORY_TRACT
  Filled 2017-09-09: qty 28

## 2017-09-09 MED ORDER — MELATONIN 5 MG PO TABS: 5.0000 mg | ORAL_TABLET | Freq: Every day | ORAL | 0 refills | Status: AC

## 2017-09-09 MED ORDER — ALBUTEROL SULFATE HFA 108 (90 BASE) MCG/ACT IN AERS: 2.0000 | INHALATION_SPRAY | Freq: Four times a day (QID) | RESPIRATORY_TRACT | Status: AC | PRN

## 2017-09-09 MED ORDER — SODIUM CHLORIDE 0.9 % IV SOLN
500.0000 mL | Freq: Once | INTRAVENOUS | Status: AC
  Administered 2017-09-10: 500 mL via INTRAVENOUS

## 2017-09-09 MED ORDER — ALUM & MAG HYDROXIDE-SIMETH 200-200-20 MG/5ML PO SUSP: 30.0000 mL | ORAL | 0 refills | Status: DC | PRN

## 2017-09-09 MED ORDER — HYDROXYZINE HCL 25 MG PO TABS
25.0000 mg | ORAL_TABLET | Freq: Three times a day (TID) | ORAL | Status: DC | PRN
  Administered 2017-09-09 – 2017-09-13 (×7): 25 mg via ORAL
  Filled 2017-09-09 (×8): qty 1

## 2017-09-09 MED ORDER — ACETAMINOPHEN 325 MG PO TABS
650.0000 mg | ORAL_TABLET | Freq: Four times a day (QID) | ORAL | Status: DC | PRN
  Administered 2017-09-11: 650 mg via ORAL
  Filled 2017-09-09: qty 2

## 2017-09-09 MED ORDER — ALBUTEROL SULFATE HFA 108 (90 BASE) MCG/ACT IN AERS
2.0000 | INHALATION_SPRAY | Freq: Four times a day (QID) | RESPIRATORY_TRACT | Status: DC | PRN
  Filled 2017-09-09: qty 6.7

## 2017-09-09 MED ORDER — VORTIOXETINE HBR 5 MG PO TABS
10.0000 mg | ORAL_TABLET | Freq: Every day | ORAL | Status: DC
  Administered 2017-09-09 – 2017-09-12 (×4): 10 mg via ORAL
  Filled 2017-09-09: qty 2
  Filled 2017-09-09: qty 1
  Filled 2017-09-09 (×2): qty 2
  Filled 2017-09-09: qty 1
  Filled 2017-09-09: qty 2
  Filled 2017-09-09 (×2): qty 1

## 2017-09-09 MED ORDER — MELATONIN 5 MG PO TABS
5.0000 mg | ORAL_TABLET | Freq: Every day | ORAL | Status: DC
  Administered 2017-09-09 – 2017-09-12 (×4): 5 mg via ORAL
  Filled 2017-09-09 (×6): qty 1

## 2017-09-09 MED ORDER — TRAZODONE HCL 100 MG PO TABS
100.0000 mg | ORAL_TABLET | Freq: Every evening | ORAL | Status: DC | PRN
  Administered 2017-09-09 – 2017-09-10 (×2): 100 mg via ORAL
  Filled 2017-09-09 (×2): qty 1

## 2017-09-09 NOTE — BH Assessment (Signed)
This Clinical research associatewriter spoke with Tresa EndoKelly about [facility]. Tresa EndoKelly stated [facility]. In[facility] [phone]) patient can transport at 7:30pm and bed assignment is 305. Provided Tammy SoursGreg TTS fax number [phone] to fax signed voluntary consent for treatment form.

## 2017-09-09 NOTE — Progress Notes (Signed)
  [facility] :  Will you be returning to the same living situation after discharge:  No. At discharge, do you have transportation home?: Yes,  [place] [facility]. Do you have the ability to pay for your medications: Yes,  BCBS  Release of information consent forms completed and in the chart;  Patient's signature needed at discharge.  Patient to Follow up at: Follow-up Information    [facility] Follow up.   Specialty:  Behavioral Health Why:  You will be transferred to [facility], Dr Toni Amendlapacs, for your ECT treatments. Contact information: [address] [id number] [place] [id number] [phone]          Next level of care provider has access to [facility] Link:yes  Safety Planning and Suicide Prevention discussed: Yes,  with wife  Have you used any form of tobacco in the last 30 days? (Cigarettes, Smokeless Tobacco, Cigars, and/or Pipes): No  Has patient been referred to the Quitline?: N/A patient is not a smoker  Patient has been referred for addiction treatment: N/A  Lorri FrederickWierda, Lillyen Schow [name], LCSW 09/09/2017, 10:35 AM

## 2017-09-09 NOTE — Progress Notes (Signed)
Pt attended goals and orientation group this morning. Pt participated and was active.   

## 2017-09-09 NOTE — [facility] Group Notes (Signed)
[facility] LCSW Group Therapy Note  Date/Time: 09/09/17, 1315  Type of Therapy/Topic:  Group Therapy:  Balance in Life  Participation Level:  active  Description of Group:    This group will address the concept of balance and how it feels and looks when one is unbalanced. Patients will be encouraged to process areas in their lives that are out of balance, and identify reasons for remaining unbalanced. Facilitators will guide patients utilizing problem- solving interventions to address and correct the stressor making their life unbalanced. Understanding and applying boundaries will be explored and addressed for obtaining  and maintaining a balanced life. Patients will be encouraged to explore ways to assertively make their unbalanced needs known to significant others in their lives, using other group members and facilitator for support and feedback.  Therapeutic Goals: 1. Patient will identify two or more emotions or situations they have that consume much of in their lives. 2. Patient will identify signs/triggers that life has become out of balance:  3. Patient will identify two ways to set boundaries in order to achieve balance in their lives:  4. Patient will demonstrate ability to communicate their needs through discussion and/or role plays  Summary of Patient Progress: Pt shared that mental emotional, family, and health are out of balance in his life currently.  Pt active in group discussion regarding how to try to set boundaries and move these areas back into better balance.          Therapeutic Modalities:   Cognitive Behavioral Therapy Solution-Focused Therapy Assertiveness Training  Daleen SquibbGreg Mertie Haslem, [place]CS[place]

## 2017-09-09 NOTE — Discharge Summary (Addendum)
Physician Discharge Summary Note  Patient:  [name] is an 62 y.o., male  MRN:  [id number]  DOB:  [birth year]  Patient phone:  [phone] (home)   Patient address:   [address] [place] [address],   Total Time spent with patient: Greater than 30 minutes  Date of Admission:  09/03/2017 Date of Discharge: 09-09-17  Reason for Admission:   suicidal ideation    Principal Problem: <principal problem not specified> Discharge Diagnoses: Patient Active Problem List   Diagnosis Date Noted  . Severe recurrent major depression without psychotic features (HCC) [F33.2]   . Posttraumatic stress disorder [F43.10]   . Pneumonia [J18.9] 03/08/2017  . COPD with acute exacerbation (HCC) [J44.1] 01/19/2017  . OSA (obstructive sleep apnea) [G47.33] 01/11/2017  . Rash [R21] 01/11/2017  . Hyponatremia [E87.1] 10/13/2016  . Hypertension [I10] 07/09/2016  . History of adenomatous polyp of colon [Z86.010] 07/09/2016  . 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 Obesity, Class II, BMI 35-39.9 [E66.9] 07/09/2016  . Edema [R60.9] 07/09/2016  . COPD (chronic obstructive pulmonary disease) (HCC) [J44.9] 02/27/2015  . Dyspnea on exertion [R06.09] 01/18/2015  . Lung mass [R91.8] 10/10/2013  . Chronic maxillary sinusitis [J32.0] 09/27/2013  . GERD (gastroesophageal reflux disease) [K21.9] 09/27/2013  . Chronic cough [R05] 06/21/2013  . Alcohol dependence (HCC) [F10.20] 07/29/2011  . Bipolar affective disorder, depressed, moderate (HCC) [F31.32] 07/29/2011    Class: Chronic   Past Psychiatric History: Bipolar disorder. Patient has been receiving outpatient treatment by Dr. Gerda Diss book at [facility].  He received ECT as an outpatient at [facility] in 2015.  He received TMS locally last fall.  He had one previous psychiatric hospitalization in 2013 for detox.  That was at our facility as well as [facility].  He has been on multiple medications in the  past.    Past Medical History:  Past Medical History:  Diagnosis Date  . ADD (attention deficit disorder)   . Anxiety    Panic  . Bipolar disorder (HCC)   . GERD (gastroesophageal reflux disease)    ? they thought GERD caused cough.   . Mental disorder    ADD, bipolar  . Pneumonia 04/12/2013  . Shortness of breath   . Substance abuse [facility])     Past Surgical History:  Procedure Laterality Date  . APPENDECTOMY  1988  . COLONOSCOPY    . TONSILLECTOMY    . VIDEO BRONCHOSCOPY Bilateral 10/10/2013   Procedure: VIDEO BRONCHOSCOPY WITH FLUORO;  Surgeon: Oretha Milch, MD;  [facility] END[facility];  Service: Cardiopulmonary;  Laterality: Bilateral;  . VIDEO BRONCHOSCOPY N/A 10/13/2013   Procedure: VIDEO BRONCHOSCOPY, REMOVAL OF ENDOBRONCHIAL LESION, PROBABLE FOREIGN BODY;  Surgeon: Delight Ovens, MD;  Location: [facility] OR;  Service: Thoracic;  Laterality: N/A;  . VIDEO BRONCHOSCOPY N/A 11/28/2013   Procedure: VIDEO BRONCHOSCOPY;  Surgeon: Delight Ovens, MD;  [facility] OR;  Service: Thoracic;  Laterality: N/A;  bronchoscopy for removal of foreign body  . VIDEO BRONCHOSCOPY Bilateral 03/10/2017   Procedure: VIDEO BRONCHOSCOPY WITH FLUORO;  Surgeon: Oretha Milch, MD;  Location: [facility] ENDOSCOPY;  Service: Cardiopulmonary;  Laterality: Bilateral;   Family History:  Family History  Problem Relation Age of Onset  . Breast cancer Mother   . Alcohol abuse Father        unclear cause of death. 23  . Colon cancer Father   . Alcohol abuse Sister        substance as well  .  Alcohol abuse Brother        substance abuse  . Alcohol abuse Sister        substance abuse   Family Psychiatric  History: Father was an alcoholic, he suspects his mother had bipolar disorder.  "She was mean is could be".   Social History:  Social History   Substance and Sexual Activity  Alcohol Use No   Comment: none at all     Social History   Substance and Sexual Activity  Drug Use No    Social History    Socioeconomic History  . Marital status: Married    Spouse name: Not on file  . Number of children: Not on file  . Years of education: Not on file  . Highest education level: Not on file  Occupational History  . Not on file  Social Needs  . Financial resource strain: Not on file  . Food insecurity:    Worry: Not on file    Inability: Not on file  . Transportation needs:    Medical: Not on file    Non-medical: Not on file  Tobacco Use  . Smoking status: Former Smoker    Packs/day: 1.00    Years: 20.00    Pack years: 20.00    Types: Cigarettes    Last attempt to quit: 02/16/1993    Years since quitting: 24.5  . Smokeless tobacco: Never Used  Substance and Sexual Activity  . Alcohol use: No    Comment: none at all  . Drug use: No  . Sexual activity: Yes  Lifestyle  . Physical activity:    Days per week: Not on file    Minutes per session: Not on file  . Stress: Not on file  Relationships  . Social connections:    Talks on phone: Not on file    Gets together: Not on file    Attends religious service: Not on file    Active member of club or organization: Not on file    Attends meetings of clubs or organizations: Not on file    Relationship status: Not on file  Other Topics Concern  . Not on file  Social History Narrative   Married. Lives with wife. 2 children. Soon to be grandfather 07/2016.       Freelance work/semi retired. Editing/writing.       Hobbies: enjoys writing poetry   Hospital Course:  Patient is seen and examined.  Patient is a 62 year old male with a past psychiatric history significant for major depressive disorder as well as posttraumatic stress disorder who presented to the behavioral health assessment Center on 7/18 with suicidal ideation.  He stated that he had been on increasingly stressed and depressed state for the last 3 weeks.  He stated he was caring for his grandchild, and that the child's crying yesterday finally was too much for him.  He  admitted to worsening depression, anxiety, social withdrawal, loss of interest in usual pleasures.  He is followed at [facility] by Dr. Carver Fila.  He spoke to him on the telephone yesterday, and Dr. Carver Fila recommended that he come to the hospital.  The patient has been on multiple antidepressants in the past, and was switched to Trintellix approximately a month ago.  He was taking 5 mg a day and in recent follow-up and new prescription was called in for 10 mg a day.  He has not started that yet.  He has been treated with ECT as well as TMS in  the past.  The ECT was at [facility] in 2015, and the TMS was locally in the fall 2018.  He was admitted to the hospital for evaluation and stabilization.  After the above admission assessment patient was started on medication regimen for presenting symptoms. He was medicated on; Trileptal 150 mgrs BID for mood disorder,  Neurontin 600 mgrs TID for pain, anxiety, TYrintelix 10 mgr QDAY for depression, Trazodone 100 mgrs QHS PRN for insomnia as needed, Melatonin 5 mgr QHS for insomnia, Vistaril 25 mgrs Q 8 hours PRN for anxiety as needed.  Patient showed no improvement in symptoms. His mood remained depressed and vaguely dysphoric, affect constricted and subtly irritable. Patient had tried ECT int he past. Because he had  not responded well to medications he endorsed that he was interested in retrying this treatment modality. His case was  evaluated by Dr. Toni Amend at [facility] and is considered to be a good candidate for ECT  Patient was discharged to inpatient psychiatric unit for ECT treatment  Prior to discharge, he denied SI, HI or AVH. Patient left [facility] with all personal belongings in no apparent distress. Transportation provided by [employer].  Labs: Reviewed and noted as below   Physical Findings: AIMS: Facial and Oral Movements Muscles of Facial Expression: None, normal Lips and Perioral Area: None, normal Jaw: None, normal Tongue:  None, normal,Extremity Movements Upper (arms, wrists, hands, fingers): None, normal Lower (legs, knees, ankles, toes): None, normal, Trunk Movements Neck, shoulders, hips: None, normal, Overall Severity Severity of abnormal movements (highest score from questions above): None, normal Incapacitation due to abnormal movements: None, normal Patient's awareness of abnormal movements (rate only patient's report): No Awareness, Dental Status Current problems with teeth and/or dentures?: No Does patient usually wear dentures?: No  CIWA:  CIWA-Ar Total: 1 COWS:  COWS Total Score: 2  Musculoskeletal: Strength & Muscle Tone: within normal limits Gait & Station: normal Patient leans: N/A  Psychiatric Specialty Exam: Physical Exam  Nursing note and vitals reviewed. Constitutional: He is oriented to person, place, and time. He appears well-developed.  HENT:  Head: Normocephalic.  Eyes: Pupils are equal, round, and reactive to light.  Neck: Normal range of motion.  Cardiovascular:  Hx. HTN  Respiratory: Effort normal.  Hx. Asthma  GI: Soft.  Genitourinary:  Genitourinary Comments: Deferred  Musculoskeletal: Normal range of motion.  Neurological: He is alert and oriented to person, place, and time.  Skin: Skin is warm.    Review of Systems  Constitutional: Negative.   HENT: Negative.   Eyes: Negative.   Respiratory: Negative.   Cardiovascular: Negative.   Gastrointestinal: Negative.   Genitourinary: Negative.   Musculoskeletal: Negative.   Skin: Negative.   Neurological: Negative.   Endo/Heme/Allergies: Negative.   Psychiatric/Behavioral: Positive for depression (Worsening). Negative for hallucinations, memory loss, substance abuse and suicidal ideas. The patient is nervous/anxious and has insomnia.     Blood pressure (!) 139/98, pulse 81, temperature 97.9 F (36.6 C), temperature source Oral, resp. rate 18, height 5\' 10"  (1.778 m), weight 117.9 kg (260 lb).Body mass index is  37.31 kg/m.  See Md's SRA   Blood Alcohol level:  Lab Results  Component Value Date   [facility] <10 09/02/2017   ETH <11 07/28/2011   Metabolic Disorder Labs:  Lab Results  Component Value Date   HGBA1C 5.3 07/09/2016   No results found for: PROLACTIN Lab Results  Component Value Date   CHOL 137 08/09/2015   TRIG 46 08/09/2015  HDL 44 08/09/2015   LDLCALC 84 08/09/2015   See Psychiatric Specialty Exam and Suicide Risk Assessment completed by Attending Physician prior to discharge.  Discharge destination:  Other:  [facility] for ECT  Is patient on multiple antipsychotic therapies at discharge:  No   Has Patient had three or more failed trials of antipsychotic monotherapy by history:  No  Recommended Plan for Multiple Antipsychotic Therapies: NA  Allergies as of 09/09/2017      Reactions   Omeprazole Diarrhea, Nausea And Vomiting      Medication List    STOP taking these medications   ferrous sulfate 325 (65 FE) MG tablet   Fluticasone-Umeclidin-Vilant 100-62.5-25 MCG/INH Aepb Commonly known as:  TRELEGY ELLIPTA   gabapentin 600 MG tablet Commonly known as:  NEURONTIN Replaced by:  gabapentin 300 MG capsule   hydrOXYzine 50 MG capsule Commonly known as:  VISTARIL   Melatonin 5 MG Lozg Replaced by:  Melatonin 5 MG Tabs   naproxen sodium 220 MG tablet Commonly known as:  ALEVE   PRESCRIPTION MEDICATION   traZODone 100 MG tablet Commonly known as:  DESYREL     TAKE these medications     Indication  acetaminophen 325 MG tablet Commonly known as:  TYLENOL Take 2 tablets (650 mg total) by mouth every 6 (six) hours as needed for mild pain.  Indication:  Fever   albuterol 108 (90 Base) MCG/ACT inhaler Commonly known as:  PROAIR HFA Inhale 2 puffs into the lungs every 6 (six) hours as needed for wheezing or shortness of breath. What changed:  Another medication with the same name was changed. Make sure you understand how and when to take each.  Indication:   Asthma   albuterol (2.5 MG/3ML) 0.083% nebulizer solution Commonly known as:  PROVENTIL Take 3 mLs (2.5 mg total) by nebulization every 6 (six) hours as needed for wheezing or shortness of breath. What changed:    when to take this  reasons to take this  additional instructions  Indication:  Reversible Disease of Blockage in Breathing Passages   alum & mag hydroxide-simeth 200-200-20 MG/5ML suspension Commonly known as:  MAALOX/MYLANTA Take 30 mLs by mouth every 4 (four) hours as needed for indigestion.  Indication:  Acid Indigestion, Heartburn   amLODipine 10 MG tablet Commonly known as:  NORVASC Take 1 tablet (10 mg total) by mouth daily. For high blood pressure Start taking on:  09/10/2017 What changed:    medication strength  how much to take  additional instructions  Indication:  High Blood Pressure Disorder   fluticasone furoate-vilanterol 100-25 MCG/INH Aepb Commonly known as:  BREO ELLIPTA Inhale 1 puff into the lungs daily. For SOB Start taking on:  09/10/2017  Indication:  Chronic Obstructive Lung Disease   gabapentin 300 MG capsule Commonly known as:  NEURONTIN Take 2 capsules (600 mg total) by mouth 3 (three) times daily. For agitation Replaces:  gabapentin 600 MG tablet  Indication:  Agitation   hydrochlorothiazide 12.5 MG capsule Commonly known as:  MICROZIDE Take 1 capsule (12.5 mg total) by mouth daily. For high blood pressure Start taking on:  09/10/2017  Indication:  High Blood Pressure Disorder   hydrOXYzine 25 MG tablet Commonly known as:  ATARAX/VISTARIL Take 1 tablet (25 mg total) by mouth 3 (three) times daily as needed for anxiety.  Indication:  Feeling Anxious   losartan 100 MG tablet Commonly known as:  COZAAR Take 1 tablet (100 mg total) by mouth daily. For high blood What changed:  additional instructions  Indication:  High Blood Pressure Disorder   magnesium hydroxide 400 MG/5ML suspension Commonly known as:  MILK OF  MAGNESIA Take 30 mLs by mouth daily as needed for mild constipation.  Indication:  Constipation   Melatonin 5 MG Tabs Take 1 tablet (5 mg total) by mouth at bedtime. For sleep Replaces:  Melatonin 5 MG Lozg  Indication:  Trouble Sleeping   OXcarbazepine 150 MG tablet Commonly known as:  TRILEPTAL Take 1 tablet (150 mg total) by mouth 2 (two) times daily. For mood stabilization  Indication:  Mood stabilization   umeclidinium bromide 62.5 MCG/INH Aepb Commonly known as:  INCRUSE ELLIPTA Inhale 1 puff into the lungs daily. For COPD Start taking on:  09/10/2017  Indication:  Chronic Obstructive Lung Disease   vortioxetine HBr 10 MG Tabs tablet Commonly known as:  TRINTELLIX Take 1 tablet (10 mg total) by mouth at bedtime. For depression What changed:    medication strength  how much to take  when to take this  additional instructions  Indication:  Major Depressive Disorder      Follow-up Information    [facility] Follow up.   Specialty:  Behavioral Health Why:  You will be transferred to [facility], Dr Toni Amendlapacs, for your ECT treatments. Contact information: [address] [id number] [place] [id number] [phone]         Follow-up recommendations: Activity:  As tolerated Diet: As recommended by your primary care doctor. Keep all scheduled follow-up appointments as recommended.   Comments: [facility]   Signed: Denzil MagnusonLaShunda Thomas, NP, PMHNP, FNP-BC 09/09/2017, 1:08 PM   Patient seen, Suicide Assessment Completed.  Disposition Plan Reviewed

## 2017-09-09 NOTE — Plan of Care (Signed)
  Problem: Education: Goal: Emotional status will improve Outcome: Completed/Met Goal: Mental status will improve Outcome: Completed/Met   Problem: Activity: Goal: Interest or engagement in activities will improve Outcome: Completed/Met Goal: Sleeping patterns will improve Outcome: Completed/Met   Problem: Coping: Goal: Ability to verbalize frustrations and anger appropriately will improve Outcome: Completed/Met Goal: Ability to demonstrate self-control will improve Outcome: Completed/Met   Problem: Health Behavior/Discharge Planning: Goal: Identification of resources available to assist in meeting health care needs will improve Outcome: Completed/Met Goal: Compliance with treatment plan for underlying cause of condition will improve Outcome: Completed/Met   Problem: Physical Regulation: Goal: Ability to maintain clinical measurements within normal limits will improve Outcome: Completed/Met   Problem: Safety: Goal: Periods of time without injury will increase Outcome: Completed/Met   Problem: Education: Goal: Utilization of techniques to improve thought processes will improve Outcome: Completed/Met Goal: Knowledge of the prescribed therapeutic regimen will improve Outcome: Completed/Met   Problem: Activity: Goal: Interest or engagement in leisure activities will improve Outcome: Completed/Met Goal: Imbalance in normal sleep/wake cycle will improve Outcome: Completed/Met   Problem: Coping: Goal: Coping ability will improve Outcome: Completed/Met Goal: Will verbalize feelings Outcome: Completed/Met   Problem: Health Behavior/Discharge Planning: Goal: Ability to make decisions will improve Outcome: Completed/Met Goal: Compliance with therapeutic regimen will improve Outcome: Completed/Met   Problem: Role Relationship: Goal: Will demonstrate positive changes in social behaviors and relationships Outcome: Completed/Met   Problem: Safety: Goal: Ability to  disclose and discuss suicidal ideas will improve Outcome: Completed/Met Goal: Ability to identify and utilize support systems that promote safety will improve Outcome: Completed/Met   Problem: Self-Concept: Goal: Will verbalize positive feelings about self Outcome: Completed/Met Goal: Level of anxiety will decrease Outcome: Completed/Met   Problem: Education: Goal: Ability to make informed decisions regarding treatment will improve Outcome: Completed/Met   Problem: Coping: Goal: Coping ability will improve Outcome: Completed/Met   Problem: Health Behavior/Discharge Planning: Goal: Identification of resources available to assist in meeting health care needs will improve Outcome: Completed/Met   Problem: Medication: Goal: Compliance with prescribed medication regimen will improve Outcome: Completed/Met   Problem: Self-Concept: Goal: Ability to disclose and discuss suicidal ideas will improve Outcome: Completed/Met Goal: Will verbalize positive feelings about self Outcome: Completed/Met   Problem: Education: Goal: Knowledge of General Education information will improve Description Including pain rating scale, medication(s)/side effects and non-pharmacologic comfort measures Outcome: Completed/Met   Problem: Health Behavior/Discharge Planning: Goal: Ability to manage health-related needs will improve Outcome: Completed/Met   Problem: Coping: Goal: Level of anxiety will decrease Outcome: Completed/Met   Problem: Safety: Goal: Ability to remain free from injury will improve Outcome: Completed/Met

## 2017-09-09 NOTE — Progress Notes (Signed)
Pt discharged from unit after AVS, transition report, prescriptions, and suicide risk assessment were reviewed. Pt confirmed information with teach back. Pt denies SI/HI/AVH and contracts to seek support if thoughts as such occur. Patient was offered support and encouragement. All belongings returned to patient. Pts questions were answered and was escorted safely to transportation.   

## 2017-09-09 NOTE — Progress Notes (Signed)
[facility] with [facility].  They are not accepting any patients until they access their discharge situation.

## 2017-09-09 NOTE — [facility] Suicide Risk Assessment (Signed)
[facility] Discharge Suicide Risk Assessment   Principal Problem: depression Discharge Diagnoses:  Patient Active Problem List   Diagnosis Date Noted  . Severe recurrent major depression without psychotic features (HCC) [F33.2]   . Posttraumatic stress disorder [F43.10]   . Pneumonia [J18.9] 03/08/2017  . COPD with acute exacerbation (HCC) [J44.1] 01/19/2017  . OSA (obstructive sleep apnea) [G47.33] 01/11/2017  . Rash [R21] 01/11/2017  . Hyponatremia [E87.1] 10/13/2016  . Hypertension [I10] 07/09/2016  . History of adenomatous polyp of colon [Z86.010] 07/09/2016  . 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 Obesity, Class II, BMI 35-39.9 [E66.9] 07/09/2016  . Edema [R60.9] 07/09/2016  . COPD (chronic obstructive pulmonary disease) (HCC) [J44.9] 02/27/2015  . Dyspnea on exertion [R06.09] 01/18/2015  . Lung mass [R91.8] 10/10/2013  . Chronic maxillary sinusitis [J32.0] 09/27/2013  . GERD (gastroesophageal reflux disease) [K21.9] 09/27/2013  . Chronic cough [R05] 06/21/2013  . Alcohol dependence (HCC) [F10.20] 07/29/2011  . Bipolar affective disorder, depressed, moderate (HCC) [F31.32] 07/29/2011    Class: Chronic    Total Time spent with patient: 30 minutes  Musculoskeletal: Strength & Muscle Tone: within normal limits Gait & Station: normal Patient leans: N/A  Psychiatric Specialty Exam: ROS denies chest pain, no shortness of breath, no vomiting, no diarrhea, no fever  Blood pressure (!) 139/98, pulse 81, temperature 97.9 F (36.6 C), temperature source Oral, resp. rate 18, height 5\' 10"  (1.778 m), weight 117.9 kg (260 lb).Body mass index is 37.31 kg/m.  General Appearance: Fairly Groomed  Patent attorney::  Fair  Speech:  Normal Rate409  Volume:  Normal  Mood:  reports mood as " same", remains depressed, vaguely irritable  Affect:  constricted, vaguely irritable  Thought Process:  Linear and Descriptions of Associations: Intact  Orientation:  Other:  fully alert and attentive   Thought Content:  recent and remote grossly intact   Suicidal Thoughts:  No denies suicidal or self injurious ideations, denies homicidal or violent ideations   Homicidal Thoughts:  No  Memory:  recent and remote grossly intact   Judgement:  Fair  Insight:  Fair  Psychomotor Activity:  Decreased  Concentration:  Good  Recall:  Good  Fund of Knowledge:Good  Language: Good  Akathisia:  Negative  Handed:  Right  AIMS (if indicated):     Assets:  Desire for Improvement Resilience  Sleep:  Number of Hours: 5.75  Cognition: WNL  ADL's:  Intact   Mental Status Per Nursing Assessment::   On Admission:  Suicidal ideation indicated by patient, Self-harm thoughts, Belief that plan would result in death  Demographic Factors:  36, married , unemployed   Loss Factors: Reports family stressors, states he feels there has been marital distancing   Historical Factors: History of depression, history of a suicide attempt in the past, history of alcohol use disorder, now in remission  Risk Reduction Factors:   Positive coping skills or problem solving skills  Continued Clinical Symptoms:  At this time patient is alert, attentive, calm, no psychomotor agitation, mood remains depressed and vaguely dysphoric, affect constricted and subtly irritable , no thought disorder, no suicidal or self injurious ideations at this time, no homicidal or violent ideations, no psychotic symptoms. Denies medication side effects. No disruptive or agitated behaviors on unit  I offered patient to speak with his wife but he declined. States she is aware he is being discharged and current plan of going to [facility] inpatient psychiatric unit for ECT treatment .  Cognitive Features That Contribute To Risk:  No  gross cognitive deficits noted upon discharge. Is alert , attentive, and oriented x 3    Suicide Risk:  Moderate:  Frequent suicidal ideation with limited intensity, and duration, some specificity in terms of  plans, no associated intent, good self-control, limited dysphoria/symptomatology, some risk factors present, and identifiable protective factors, including available and accessible social support.  Follow-up Information    [facility] Follow up.   Why:  Your next appointment with Dr. Carmelia Rollerewayne Book is scheduled for  Contact information: [address] [facility] [place] [id number] Phone: [phone] Fax: [phone]          Plan Of Care/Follow-up recommendations:  Activity:  as tolerated  Diet:  regular Tests:  NA Other:  See below  Patient is going to [facility] to initiate inpatient based ECT management under the care of Dr. Nicola Policelapacs  Fernando A Cobos, MD 09/09/2017, 7:58 AM

## 2017-09-10 ENCOUNTER — Inpatient Hospital Stay: Payer: BLUE CROSS/BLUE SHIELD | Admitting: Anesthesiology

## 2017-09-10 ENCOUNTER — Inpatient Hospital Stay: Payer: BLUE CROSS/BLUE SHIELD

## 2017-09-10 ENCOUNTER — Other Ambulatory Visit: Payer: Self-pay

## 2017-09-10 DIAGNOSIS — T1491XA Suicide attempt, initial encounter: Secondary | ICD-10-CM

## 2017-09-10 DIAGNOSIS — F332 Major depressive disorder, recurrent severe without psychotic features: Principal | ICD-10-CM

## 2017-09-10 LAB — GLUCOSE, CAPILLARY: Glucose-Capillary: 88 mg/dL (ref 70–99)

## 2017-09-10 MED ORDER — KETOROLAC TROMETHAMINE 30 MG/ML IJ SOLN
30.0000 mg | Freq: Once | INTRAMUSCULAR | Status: AC
  Administered 2017-09-10: 30 mg via INTRAVENOUS
  Filled 2017-09-10: qty 1

## 2017-09-10 MED ORDER — SODIUM CHLORIDE 0.9 % IV SOLN
INTRAVENOUS | Status: DC | PRN
  Administered 2017-09-10: 11:00:00 via INTRAVENOUS

## 2017-09-10 MED ORDER — GABAPENTIN 600 MG PO TABS
600.0000 mg | ORAL_TABLET | Freq: Three times a day (TID) | ORAL | Status: DC
  Administered 2017-09-10 – 2017-09-13 (×10): 600 mg via ORAL
  Filled 2017-09-10 (×10): qty 1

## 2017-09-10 MED ORDER — ESMOLOL HCL 100 MG/10ML IV SOLN
INTRAVENOUS | Status: DC | PRN
  Administered 2017-09-10: 20 mg via INTRAVENOUS

## 2017-09-10 MED ORDER — SUCCINYLCHOLINE CHLORIDE 20 MG/ML IJ SOLN
INTRAMUSCULAR | Status: DC | PRN
  Administered 2017-09-10: 50 mg via INTRAVENOUS
  Administered 2017-09-10: 120 mg via INTRAVENOUS

## 2017-09-10 MED ORDER — KETOROLAC TROMETHAMINE 30 MG/ML IJ SOLN
INTRAMUSCULAR | Status: AC
  Filled 2017-09-10: qty 1

## 2017-09-10 MED ORDER — MIDAZOLAM HCL 2 MG/2ML IJ SOLN
INTRAMUSCULAR | Status: AC
  Filled 2017-09-10: qty 2

## 2017-09-10 MED ORDER — METHOHEXITAL SODIUM 100 MG/10ML IV SOSY
PREFILLED_SYRINGE | INTRAVENOUS | Status: DC | PRN
  Administered 2017-09-10: 50 mg via INTRAVENOUS
  Administered 2017-09-10: 120 mg via INTRAVENOUS

## 2017-09-10 MED ORDER — GABAPENTIN 300 MG PO CAPS
600.0000 mg | ORAL_CAPSULE | Freq: Once | ORAL | Status: AC
  Administered 2017-09-10: 600 mg via ORAL
  Filled 2017-09-10: qty 2

## 2017-09-10 MED ORDER — MIDAZOLAM HCL 5 MG/5ML IJ SOLN
INTRAMUSCULAR | Status: DC | PRN
  Administered 2017-09-10: 2 mg via INTRAVENOUS

## 2017-09-10 MED ORDER — LABETALOL HCL 5 MG/ML IV SOLN
INTRAVENOUS | Status: DC | PRN
  Administered 2017-09-10: 10 mg via INTRAVENOUS

## 2017-09-10 NOTE — Tx Team (Addendum)
Interdisciplinary Treatment and Diagnostic Plan Update  09/10/2017 Time of Session: 8:30 AM Michaelyn BarterJohn B Helget MRN: [id number]  Principal Diagnosis: Severe recurrent major depression without psychotic features [facility](HCC)  Secondary Diagnoses: Principal Problem:   Severe recurrent major depression without psychotic features (HCC) Active Problems:   COPD (chronic obstructive pulmonary disease) (HCC)   Hypertension   Suicide attempt (HCC)   Current Medications:  Current Facility-Administered Medications  Medication Dose Route Frequency Provider Last Rate Last Dose  . acetaminophen (TYLENOL) tablet 650 mg  650 mg Oral Q6H PRN Clapacs, Joe T, MD      . albuterol (PROVENTIL HFA;VENTOLIN HFA) 108 (90 Base) MCG/ACT inhaler 2 puff  2 puff Inhalation Q6H PRN Clapacs, Henrique T, MD      . alum & mag hydroxide-simeth (MAALOX/MYLANTA) 200-200-20 MG/5ML suspension 30 mL  30 mL Oral Q4H PRN Clapacs, Dillard T, MD      . amLODipine (NORVASC) tablet 10 mg  10 mg Oral Daily Clapacs, Jackquline DenmarkJohn T, MD   10 mg at 09/10/17 0835  . ferrous sulfate tablet 325 mg  325 mg Oral QHS Clapacs, Jackquline DenmarkJohn T, MD   325 mg at 09/09/17 2142  . fluticasone furoate-vilanterol (BREO ELLIPTA) 100-25 MCG/INH 1 puff  1 puff Inhalation Daily Clapacs, Jackquline DenmarkJohn T, MD   1 puff at 09/10/17 0834   And  . umeclidinium bromide (INCRUSE ELLIPTA) 62.5 MCG/INH 1 puff  1 puff Inhalation Daily Clapacs, Jackquline DenmarkJohn T, MD   1 puff at 09/10/17 1245  . hydrochlorothiazide (MICROZIDE) capsule 12.5 mg  12.5 mg Oral Daily Clapacs, Odes T, MD   12.5 mg at 09/10/17 0836  . hydrOXYzine (ATARAX/VISTARIL) tablet 25 mg  25 mg Oral TID PRN Clapacs, Jackquline DenmarkJohn T, MD   25 mg at 09/10/17 1443  . ketorolac (TORADOL) 30 MG/ML injection           . losartan (COZAAR) tablet 100 mg  100 mg Oral Daily Clapacs, Jackquline DenmarkJohn T, MD   100 mg at 09/10/17 0837  . magnesium hydroxide (MILK OF MAGNESIA) suspension 30 mL  30 mL Oral Daily PRN Clapacs, Aleksandar T, MD      . Melatonin TABS 5 mg  5 mg Oral QHS Clapacs, Jackquline DenmarkJohn T,  MD   5 mg at 09/09/17 2155  . traZODone (DESYREL) tablet 100 mg  100 mg Oral QHS PRN Pucilowska, Jolanta B, MD   100 mg at 09/09/17 2142  . vortioxetine HBr (TRINTELLIX) tablet 10 mg  10 mg Oral QHS Clapacs, Jackquline DenmarkJohn T, MD   10 mg at 09/09/17 2155   PTA Medications: Medications Prior to Admission  Medication Sig Dispense Refill Last Dose  . acetaminophen (TYLENOL) 325 MG tablet Take 2 tablets (650 mg total) by mouth every 6 (six) hours as needed for mild pain.   09/09/2017  . albuterol (PROAIR HFA) 108 (90 Base) MCG/ACT inhaler Inhale 2 puffs into the lungs every 6 (six) hours as needed for wheezing or shortness of breath.   09/09/2017  . albuterol (PROVENTIL) (2.5 MG/3ML) 0.083% nebulizer solution Take 3 mLs (2.5 mg total) by nebulization every 6 (six) hours as needed for wheezing or shortness of breath. 75 mL 12 09/09/2017  . alum & mag hydroxide-simeth (MAALOX/MYLANTA) 200-200-20 MG/5ML suspension Take 30 mLs by mouth every 4 (four) hours as needed for indigestion. 355 mL 0 09/09/2017  . amLODipine (NORVASC) 10 MG tablet Take 1 tablet (10 mg total) by mouth daily. For high blood pressure   09/10/2017 at Unknown time  . fluticasone furoate-vilanterol (BREO  ELLIPTA) 100-25 MCG/INH AEPB Inhale 1 puff into the lungs daily. For SOB   09/09/2017  . gabapentin (NEURONTIN) 300 MG capsule Take 2 capsules (600 mg total) by mouth 3 (three) times daily. For agitation   09/09/2017  . hydrochlorothiazide (MICROZIDE) 12.5 MG capsule Take 1 capsule (12.5 mg total) by mouth daily. For high blood pressure   09/10/2017 at Unknown time  . hydrOXYzine (ATARAX/VISTARIL) 25 MG tablet Take 1 tablet (25 mg total) by mouth 3 (three) times daily as needed for anxiety. 1 tablet 0 09/09/2017  . losartan (COZAAR) 100 MG tablet Take 1 tablet (100 mg total) by mouth daily. For high blood 1 tablet 0 09/10/2017 at Unknown time  . magnesium hydroxide (MILK OF MAGNESIA) 400 MG/5ML suspension Take 30 mLs by mouth daily as needed for mild  constipation. 360 mL 0 09/09/2017  . Melatonin 5 MG TABS Take 1 tablet (5 mg total) by mouth at bedtime. For sleep  0 09/09/2017  . OXcarbazepine (TRILEPTAL) 150 MG tablet Take 1 tablet (150 mg total) by mouth 2 (two) times daily. For mood stabilization   09/09/2017  . umeclidinium bromide (INCRUSE ELLIPTA) 62.5 MCG/INH AEPB Inhale 1 puff into the lungs daily. For COPD   09/09/2017  . vortioxetine HBr (TRINTELLIX) 10 MG TABS tablet Take 1 tablet (10 mg total) by mouth at bedtime. For depression 1 tablet 0 09/09/2017    Patient Stressors: Health problems Marital or family conflict Traumatic event  Patient Strengths: Ability for insight Active sense of humor Average or above average intelligence Capable of independent living Motivation for treatment/growth  Treatment Modalities: Medication Management, Group therapy, Case management,  1 to 1 session with clinician, Psychoeducation, Recreational therapy.   Physician Treatment Plan for Primary Diagnosis: Severe recurrent major depression without psychotic features (HCC) Long Term Goal(s): Improvement in symptoms so as ready for discharge Improvement in symptoms so as ready for discharge   Short Term Goals: Ability to verbalize feelings will improve Ability to disclose and discuss suicidal ideas Ability to demonstrate self-control will improve Ability to identify and develop effective coping behaviors will improve  Medication Management: Evaluate patient's response, side effects, and tolerance of medication regimen.  Therapeutic Interventions: 1 to 1 sessions, Unit Group sessions and Medication administration.  Evaluation of Outcomes: Progressing  Physician Treatment Plan for Secondary Diagnosis: Principal Problem:   Severe recurrent major depression without psychotic features (HCC) Active Problems:   COPD (chronic obstructive pulmonary disease) (HCC)   Hypertension   Suicide attempt (HCC)  Long Term Goal(s): Improvement in symptoms  so as ready for discharge Improvement in symptoms so as ready for discharge   Short Term Goals: Ability to verbalize feelings will improve Ability to disclose and discuss suicidal ideas Ability to demonstrate self-control will improve Ability to identify and develop effective coping behaviors will improve     Medication Management: Evaluate patient's response, side effects, and tolerance of medication regimen.  Therapeutic Interventions: 1 to 1 sessions, Unit Group sessions and Medication administration.  Evaluation of Outcomes: Progressing   RN Treatment Plan for Primary Diagnosis: Severe recurrent major depression without psychotic features (HCC) Long Term Goal(s): Knowledge of disease and therapeutic regimen to maintain health will improve  Short Term Goals: Ability to verbalize frustration and anger appropriately will improve, Ability to disclose and discuss suicidal ideas, Ability to identify and develop effective coping behaviors will improve and Compliance with prescribed medications will improve  Medication Management: RN will administer medications as ordered by provider, will assess and evaluate patient's  response and provide education to patient for prescribed medication. RN will report any adverse and/or side effects to prescribing provider.  Therapeutic Interventions: 1 on 1 counseling sessions, Psychoeducation, Medication administration, Evaluate responses to treatment, Monitor vital signs and CBGs as ordered, Perform/monitor CIWA, COWS, AIMS and Fall Risk screenings as ordered, Perform wound care treatments as ordered.  Evaluation of Outcomes: Progressing   LCSW Treatment Plan for Primary Diagnosis: Severe recurrent major depression without psychotic features (HCC) Long Term Goal(s): Safe transition to appropriate next level of care at discharge, Engage patient in therapeutic group addressing interpersonal concerns.  Short Term Goals: Engage patient in aftercare planning  with referrals and resources and Increase skills for wellness and recovery  Therapeutic Interventions: Assess for all discharge needs, 1 to 1 time with Social worker, Explore available resources and support systems, Assess for adequacy in community support network, Educate family and significant other(s) on suicide prevention, Complete Psychosocial Assessment, Interpersonal group therapy.  Evaluation of Outcomes: Progressing   Progress in Treatment: Attending groups: No. Participating in groups: No. Taking medication as prescribed: Yes. Toleration medication: Yes. Family/Significant other contact made: No, will contact:  Pt has refused family/support involvement at this time. Patient understands diagnosis: Yes. Discussing patient identified problems/goals with staff: Yes. Medical problems stabilized or resolved: Yes. Denies suicidal/homicidal ideation: Yes. Issues/concerns per patient self-inventory: No. Other: n/a  New problem(s) identified: No, Describe:  No new problems identified  New Short Term/Long Term Goal(s):  Patient Goals:  Pt did not present for initial tx team meeting.  Discharge Plan or Barriers: Tentative discharge plan is for pt to return to his home and resume medication management with Dr. Carver Fila with [facility].  Pt may also continue ECT on an outpatient basis as well.  Reason for Continuation of Hospitalization: Anxiety Depression Medication stabilization  Estimated Length of Stay: 5-7 days    Attendees: Patient: 09/10/2017 3:22 PM  Physician: Dr. Mordecai Rasmussen, MD 09/10/2017 3:22 PM  Nursing: Desma Mcgregor, RN 09/10/2017 3:22 PM  RN Care Manager: 09/10/2017 3:22 PM  Social Worker: Huey Romans, LCSW 09/10/2017 3:22 PM  Recreational Therapist: Garret Reddish, LRT 09/10/2017 3:22 PM  Other: Johny Shears, LCSWA 09/10/2017 3:22 PM  Other:  09/10/2017 3:22 PM  Other: 09/10/2017 3:22 PM    Scribe for Treatment Team: Alease Frame,  LCSW 09/10/2017 3:22 PM

## 2017-09-10 NOTE — H&P (Signed)
[name] is an 62 y.o. male.   Chief Complaint: Patient with severe major depression passive suicidal ideation no psychotic symptoms.  Currently having a headache. HPI: History of recurrent severe depression with positive past response to ECT  Past Medical History:  Diagnosis Date  . ADD (attention deficit disorder)   . Anxiety    Panic  . Bipolar disorder (HCC)   . GERD (gastroesophageal reflux disease)    ? they thought GERD caused cough.   . Mental disorder    ADD, bipolar  . Pneumonia 04/12/2013  . Shortness of breath   . Substance abuse [facility])     Past Surgical History:  Procedure Laterality Date  . APPENDECTOMY  1988  . COLONOSCOPY    . TONSILLECTOMY    . VIDEO BRONCHOSCOPY Bilateral 10/10/2013   Procedure: VIDEO BRONCHOSCOPY WITH FLUORO;  Surgeon: Oretha Milch, MD;  Location: [facility] ENDOSCOPY;  Service: Cardiopulmonary;  Laterality: Bilateral;  . VIDEO BRONCHOSCOPY N/A 10/13/2013   Procedure: VIDEO BRONCHOSCOPY, REMOVAL OF ENDOBRONCHIAL LESION, PROBABLE FOREIGN BODY;  Surgeon: Delight Ovens, MD;  Location: [facility] OR;  Service: Thoracic;  Laterality: N/A;  . VIDEO BRONCHOSCOPY N/A 11/28/2013   Procedure: VIDEO BRONCHOSCOPY;  Surgeon: Delight Ovens, MD;  Location: [facility] OR;  Service: Thoracic;  Laterality: N/A;  bronchoscopy for removal of foreign body  . VIDEO BRONCHOSCOPY Bilateral 03/10/2017   Procedure: VIDEO BRONCHOSCOPY WITH FLUORO;  Surgeon: Oretha Milch, MD;  Location: [facility] ENDOSCOPY;  Service: Cardiopulmonary;  Laterality: Bilateral;    Family History  Problem Relation Age of Onset  . Breast cancer Mother   . Alcohol abuse Father        unclear cause of death. 89  . Colon cancer Father   . Alcohol abuse Sister        substance as well  . Alcohol abuse Brother        substance abuse  . Alcohol abuse Sister        substance abuse   Social History:  reports that he quit smoking about 24 years ago. His smoking use included cigarettes. He has a 20.00 pack-year  smoking history. He has never used smokeless tobacco. He reports that he does not drink alcohol or use drugs.  Allergies:  Allergies  Allergen Reactions  . Omeprazole Diarrhea and Nausea And Vomiting    Medications Prior to Admission  Medication Sig Dispense Refill  . acetaminophen (TYLENOL) 325 MG tablet Take 2 tablets (650 mg total) by mouth every 6 (six) hours as needed for mild pain.    Marland Kitchen albuterol (PROAIR HFA) 108 (90 Base) MCG/ACT inhaler Inhale 2 puffs into the lungs every 6 (six) hours as needed for wheezing or shortness of breath.    Marland Kitchen albuterol (PROVENTIL) (2.5 MG/3ML) 0.083% nebulizer solution Take 3 mLs (2.5 mg total) by nebulization every 6 (six) hours as needed for wheezing or shortness of breath. 75 mL 12  . alum & mag hydroxide-simeth (MAALOX/MYLANTA) 200-200-20 MG/5ML suspension Take 30 mLs by mouth every 4 (four) hours as needed for indigestion. 355 mL 0  . amLODipine (NORVASC) 10 MG tablet Take 1 tablet (10 mg total) by mouth daily. For high blood pressure    . fluticasone furoate-vilanterol (BREO ELLIPTA) 100-25 MCG/INH AEPB Inhale 1 puff into the lungs daily. For SOB    . gabapentin (NEURONTIN) 300 MG capsule Take 2 capsules (600 mg total) by mouth 3 (three) times daily. For agitation    . hydrochlorothiazide (MICROZIDE) 12.5 MG capsule Take  1 capsule (12.5 mg total) by mouth daily. For high blood pressure    . hydrOXYzine (ATARAX/VISTARIL) 25 MG tablet Take 1 tablet (25 mg total) by mouth 3 (three) times daily as needed for anxiety. 1 tablet 0  . losartan (COZAAR) 100 MG tablet Take 1 tablet (100 mg total) by mouth daily. For high blood 1 tablet 0  . magnesium hydroxide (MILK OF MAGNESIA) 400 MG/5ML suspension Take 30 mLs by mouth daily as needed for mild constipation. 360 mL 0  . Melatonin 5 MG TABS Take 1 tablet (5 mg total) by mouth at bedtime. For sleep  0  . OXcarbazepine (TRILEPTAL) 150 MG tablet Take 1 tablet (150 mg total) by mouth 2 (two) times daily. For mood  stabilization    . umeclidinium bromide (INCRUSE ELLIPTA) 62.5 MCG/INH AEPB Inhale 1 puff into the lungs daily. For COPD    . vortioxetine HBr (TRINTELLIX) 10 MG TABS tablet Take 1 tablet (10 mg total) by mouth at bedtime. For depression 1 tablet 0    Results for orders placed or performed during the hospital encounter of 09/09/17 (from the past 48 hour(s))  Glucose, capillary     Status: None   Collection Time: 09/10/17  6:20 AM  Result Value Ref Range   Glucose-Capillary 88 70 - 99 mg/dL   No results found.  Review of Systems  Constitutional: Negative.   HENT: Negative.   Eyes: Negative.   Respiratory: Negative.   Cardiovascular: Negative.   Gastrointestinal: Negative.   Musculoskeletal: Negative.   Skin: Negative.   Neurological: Negative.   Psychiatric/Behavioral: Positive for depression and suicidal ideas. Negative for hallucinations, memory loss and substance abuse. The patient is nervous/anxious. The patient does not have insomnia.     Blood pressure (!) 131/91, pulse 80, temperature 97.8 F (36.6 C), temperature source Oral, resp. rate 18, height 5\' 10"  (1.778 m), weight 117.9 kg (260 lb), SpO2 96 %. Physical Exam  Nursing note and vitals reviewed. Constitutional: He appears well-developed and well-nourished.  HENT:  Head: Normocephalic and atraumatic.  Eyes: Pupils are equal, round, and reactive to light. Conjunctivae are normal.  Neck: Normal range of motion.  Cardiovascular: Regular rhythm and normal heart sounds.  Respiratory: Effort normal. No respiratory distress.  GI: Soft.  Musculoskeletal: Normal range of motion.  Neurological: He is alert.  Skin: Skin is warm and dry.  Psychiatric: Judgment and thought content normal. His affect is blunt. His speech is delayed. He is agitated. Cognition and memory are normal. He exhibits a depressed mood. He exhibits normal recent memory.     Assessment/Plan Patient has been admitted to the psychiatric ward here with  plan to begin ECT.  Patient has been evaluated.  He has been given information about the treatment the risks and benefits the potential positive outcome and has been given opportunity to ask questions.  He is willing to give consent for treatment.  Patient will begin with right unilateral treatment first treatment today with a plan to continue 3 times a week index course.  Mordecai RasmussenJohn Bradyn Soward, MD 09/10/2017, 9:22 AM

## 2017-09-10 NOTE — [facility] Suicide Risk Assessment (Signed)
[facility] INPATIENT:  Family/Significant Other Suicide Prevention Education  Suicide Prevention Education:  Patient Refusal for Family/Significant Other Suicide Prevention Education: The patient [name] has refused to provide written consent for family/significant other to be provided Family/Significant Other Suicide Prevention Education during admission and/or prior to discharge.  Physician notified.  Alease FrameSonya S Ayub Kirsh, LCSW 09/10/2017, 2:12 PM

## 2017-09-10 NOTE — Progress Notes (Signed)
[facility]  09/10/2017 8:39 PM [name]  MRN:  [id number] Subjective: Follow-up note for this patient with a history of severe depression.  He was transferred to our [name] with the intention of starting ECT.  Patient had his first ECT treatment this morning.  Treatment had some complication.  Initial doses of Brevital and succinylcholine seemed to be on the low side as the patient continued to have movement past the time.  Where it would normally be expected.  Anesthesia was present and we increased the medications to where he seemed to be safely sedated and the treatment itself proceeded without complication however the patient reports that during the waking up process he felt like he could not breathe any felt panicked and agitated.  He now states that he does not want to have any further ECT treatments.  Still reports depression denies suicidal ideation currently. Principal Problem: Severe recurrent major depression without psychotic features (HCC) Diagnosis:   Patient Active Problem List   Diagnosis Date Noted  . Suicide attempt (HCC) [T14.91XA] 09/10/2017  . Severe recurrent major depression without psychotic features (HCC) [F33.2]   . Posttraumatic stress disorder [F43.10]   . Pneumonia [J18.9] 03/08/2017  . COPD with acute exacerbation (HCC) [J44.1] 01/19/2017  . OSA (obstructive sleep apnea) [G47.33] 01/11/2017  . Rash [R21] 01/11/2017  . Hyponatremia [E87.1] 10/13/2016  . Hypertension [I10] 07/09/2016  . History of adenomatous polyp of colon [Z86.010] 07/09/2016  . 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 Obesity, Class II, BMI 35-39.9 [E66.9] 07/09/2016  . Edema [R60.9] 07/09/2016  . COPD (chronic obstructive pulmonary disease) (HCC) [J44.9] 02/27/2015  . Dyspnea on exertion [R06.09] 01/18/2015  . Lung mass [R91.8] 10/10/2013  . Chronic maxillary sinusitis [J32.0] 09/27/2013  . GERD (gastroesophageal reflux disease) [K21.9] 09/27/2013  . Chronic cough [R05]  06/21/2013  . Alcohol dependence (HCC) [F10.20] 07/29/2011  . Bipolar affective disorder, depressed, moderate (HCC) [F31.32] 07/29/2011    Class: Chronic   Total Time spent with patient: 30 minutes  Past Psychiatric History: Patient has a past history of suicidal behavior of severe depression of PTSD and multiple medical problems  Past Medical History:  Past Medical History:  Diagnosis Date  . ADD (attention deficit disorder)   . Anxiety    Panic  . Bipolar disorder (HCC)   . GERD (gastroesophageal reflux disease)    ? they thought GERD caused cough.   . Mental disorder    ADD, bipolar  . Pneumonia 04/12/2013  . Shortness of breath   . Substance abuse [facility](HCC)     Past Surgical History:  Procedure Laterality Date  . APPENDECTOMY  1988  . COLONOSCOPY    . TONSILLECTOMY    . VIDEO BRONCHOSCOPY Bilateral 10/10/2013   Procedure: VIDEO BRONCHOSCOPY WITH FLUORO;  Surgeon: Oretha Milchakesh Alva V, MD;  [facility];  Service: Cardiopulmonary;  Laterality: Bilateral;  . VIDEO BRONCHOSCOPY N/A 10/13/2013   Procedure: VIDEO BRONCHOSCOPY, REMOVAL OF ENDOBRONCHIAL LESION, PROBABLE FOREIGN BODY;  Surgeon: Delight OvensEdward B Gerhardt, MD;  Location: [facility] OR;  Service: Thoracic;  Laterality: N/A;  . VIDEO BRONCHOSCOPY N/A 11/28/2013   Procedure: VIDEO BRONCHOSCOPY;  Surgeon: Delight OvensEdward B Gerhardt, MD;  [facility];  Service: Thoracic;  Laterality: N/A;  bronchoscopy for removal of foreign body  . VIDEO BRONCHOSCOPY Bilateral 03/10/2017   Procedure: VIDEO BRONCHOSCOPY WITH FLUORO;  Surgeon: Oretha MilchAlva, Rakesh V, MD;  Location[name];  Service: Cardiopulmonary;  Laterality: Bilateral;   Family History:  Family History  Problem Relation Age  of Onset  . Breast cancer Mother   . Alcohol abuse Father        unclear cause of death. 58  . Colon cancer Father   . Alcohol abuse Sister        substance as well  . Alcohol abuse Brother        substance abuse  . Alcohol abuse Sister        substance abuse    Family Psychiatric  History: See previous note Social History:  Social History   Substance and Sexual Activity  Alcohol Use No   Comment: none at all     Social History   Substance and Sexual Activity  Drug Use No    Social History   Socioeconomic History  . Marital status: Married    Spouse name: Not on file  . Number of children: Not on file  . Years of education: Not on file  . Highest education level: Not on file  Occupational History  . Not on file  Social Needs  . Financial resource strain: Not on file  . Food insecurity:    Worry: Not on file    Inability: Not on file  . Transportation needs:    Medical: Not on file    Non-medical: Not on file  Tobacco Use  . Smoking status: Former Smoker    Packs/day: 1.00    Years: 20.00    Pack years: 20.00    Types: Cigarettes    Last attempt to quit: 02/16/1993    Years since quitting: 24.5  . Smokeless tobacco: Never Used  Substance and Sexual Activity  . Alcohol use: No    Comment: none at all  . Drug use: No  . Sexual activity: Yes  Lifestyle  . Physical activity:    Days per week: Not on file    Minutes per session: Not on file  . Stress: Not on file  Relationships  . Social connections:    Talks on phone: Not on file    Gets together: Not on file    Attends religious service: Not on file    Active member of club or organization: Not on file    Attends meetings of clubs or organizations: Not on file    Relationship status: Not on file  Other Topics Concern  . Not on file  Social History Narrative   Married. Lives with wife. 2 children. Soon to be grandfather 07/2016.       Freelance work/semi retired. Editing/writing.       Hobbies: enjoys Diplomatic Services operational officer poetry   Additional Social History:                         Sleep: Fair  Appetite:  Fair  Current Medications: Current Facility-Administered Medications  Medication Dose Route Frequency Provider Last Rate Last Dose  . acetaminophen  (TYLENOL) tablet 650 mg  650 mg Oral Q6H PRN Caraline Deutschman, Rhylee T, MD      . albuterol (PROVENTIL HFA;VENTOLIN HFA) 108 (90 Base) MCG/ACT inhaler 2 puff  2 puff Inhalation Q6H PRN Lakeasha Petion, Keante T, MD      . alum & mag hydroxide-simeth (MAALOX/MYLANTA) 200-200-20 MG/5ML suspension 30 mL  30 mL Oral Q4H PRN Luverne Zerkle, Seon T, MD      . amLODipine (NORVASC) tablet 10 mg  10 mg Oral Daily Laela Deviney, Jackquline Denmark, MD   10 mg at 09/10/17 0835  . ferrous sulfate tablet 325 mg  325 mg Oral  QHS Loyed Wilmes, Jackquline Denmark, MD   325 mg at 09/09/17 2142  . fluticasone furoate-vilanterol (BREO ELLIPTA) 100-25 MCG/INH 1 puff  1 puff Inhalation Daily Arjun Hard, Jackquline Denmark, MD   1 puff at 09/10/17 0834   And  . umeclidinium bromide (INCRUSE ELLIPTA) 62.5 MCG/INH 1 puff  1 puff Inhalation Daily Josehua Hammar, Jackquline Denmark, MD   1 puff at 09/10/17 1245  . gabapentin (NEURONTIN) tablet 600 mg  600 mg Oral TID Chalsey Leeth, Kwamaine T, MD   600 mg at 09/10/17 1722  . hydrochlorothiazide (MICROZIDE) capsule 12.5 mg  12.5 mg Oral Daily Karey Suthers, Jerico T, MD   12.5 mg at 09/10/17 0836  . hydrOXYzine (ATARAX/VISTARIL) tablet 25 mg  25 mg Oral TID PRN Arlis Everly, Jackquline Denmark, MD   25 mg at 09/10/17 1443  . ketorolac (TORADOL) 30 MG/ML injection           . losartan (COZAAR) tablet 100 mg  100 mg Oral Daily Jahaan Vanwagner, Jackquline Denmark, MD   100 mg at 09/10/17 0837  . magnesium hydroxide (MILK OF MAGNESIA) suspension 30 mL  30 mL Oral Daily PRN Altamese Deguire, Keyron T, MD      . Melatonin TABS 5 mg  5 mg Oral QHS Alessandro Griep, Jackquline Denmark, MD   5 mg at 09/09/17 2155  . traZODone (DESYREL) tablet 100 mg  100 mg Oral QHS PRN Pucilowska, Jolanta B, MD   100 mg at 09/09/17 2142  . vortioxetine HBr (TRINTELLIX) tablet 10 mg  10 mg Oral QHS Harlyn Rathmann, Jackquline Denmark, MD   10 mg at 09/09/17 2155    Lab Results:  Results for orders placed or performed during the [name] encounter of 09/09/17 (from the past 48 hour(s))  Glucose, capillary     Status: None   Collection Time: 09/10/17  6:20 AM  Result Value Ref Range    Glucose-Capillary 88 70 - 99 mg/dL    Blood Alcohol level:  Lab Results  Component Value Date   ETH <10 09/02/2017   ETH <11 07/28/2011    Metabolic Disorder Labs: Lab Results  Component Value Date   HGBA1C 5.3 07/09/2016   No results found for: PROLACTIN Lab Results  Component Value Date   CHOL 137 08/09/2015   TRIG 46 08/09/2015   HDL 44 08/09/2015   LDLCALC 84 08/09/2015    Physical Findings: AIMS: Facial and Oral Movements Muscles of Facial Expression: None, normal Lips and Perioral Area: None, normal Jaw: None, normal Tongue: None, normal,Extremity Movements Upper (arms, wrists, hands, fingers): None, normal Lower (legs, knees, ankles, toes): None, normal, Trunk Movements Neck, shoulders, hips: None, normal, Overall Severity Severity of abnormal movements (highest score from questions above): None, normal Incapacitation due to abnormal movements: None, normal Patient's awareness of abnormal movements (rate only patient's report): No Awareness, Dental Status Current problems with teeth and/or dentures?: No Does patient usually wear dentures?: No  CIWA:    COWS:     Musculoskeletal: Strength & Muscle Tone: within normal limits Gait & Station: normal Patient leans: N/A  Psychiatric Specialty Exam: Physical Exam  Nursing note and vitals reviewed. Constitutional: He appears well-developed and well-nourished.  HENT:  Head: Normocephalic and atraumatic.  Eyes: Pupils are equal, round, and reactive to light. Conjunctivae are normal.  Neck: Normal range of motion.  Cardiovascular: Regular rhythm and normal heart sounds.  Respiratory: Effort normal. No respiratory distress.  GI: Soft.  Musculoskeletal: Normal range of motion.  Neurological: He is alert.  Skin: Skin is warm and dry.  Psychiatric:  His mood appears anxious. His speech is delayed. He is slowed. He expresses impulsivity. He exhibits a depressed mood. He expresses no homicidal and no suicidal  ideation. He exhibits abnormal recent memory.    Review of Systems  Constitutional: Negative.   HENT: Negative.   Eyes: Negative.   Respiratory: Negative.   Cardiovascular: Negative.   Gastrointestinal: Negative.   Musculoskeletal: Negative.   Skin: Negative.   Neurological: Negative.   Psychiatric/Behavioral: Positive for depression. Negative for hallucinations, memory loss, substance abuse and suicidal ideas. The patient is nervous/anxious. The patient does not have insomnia.     Blood pressure 124/90, pulse 84, temperature 97.7 F (36.5 C), temperature source Oral, resp. rate 18, height 5\' 10"  (1.778 m), weight 118.4 kg (261 lb), SpO2 97 %.Body mass index is 37.45 kg/m.  General Appearance: Casual  Eye Contact:  Fair  Speech:  Clear and Coherent  Volume:  Increased  Mood:  Anxious and Depressed  Affect:  Congruent  Thought Process:  Coherent  Orientation:  Full (Time, Place, and Person)  Thought Content:  Rumination and Tangential  Suicidal Thoughts:  No  Homicidal Thoughts:  No  Memory:  Immediate;   Fair Recent;   Fair Remote;   Fair  Judgement:  Impaired  Insight:  Shallow  Psychomotor Activity:  Normal  Concentration:  Concentration: Fair  Recall:  [employer] of Knowledge:  Fair  Language:  Fair  Akathisia:  No  Handed:  Right  AIMS (if indicated):     Assets:  Desire for Improvement Housing  ADL's:  Intact  Cognition:  WNL  Sleep:  Number of Hours: 6.25     Treatment Plan Summary: Daily contact with patient to assess and evaluate symptoms and progress in treatment, Medication management and Plan Seen this evening the patient is now asking to be discharged immediately.  This is in contrast to his reports of being very depressed with suicidal thoughts this morning particularly given that what he is claiming of this is that he feels even worse than he did today.  Patient seems to be impulsive about this.  I spoke with him about how occasionally initial estimates  of anesthesia doses during ECT need to be adjusted for subsequent treatments and that based on that I could reassure him that he would almost certainly have a much better experience with subsequent treatments.  Nevertheless he is saying he does not want to have further ECT.  I reassured him that we will not be forcing ECT but I do not plan to discharge him today either.  Continue current medicine reevaluate over the weekend and into the beginning of next week.  Mordecai Rasmussen, MD 09/10/2017, 8:39 PM

## 2017-09-10 NOTE — Transfer of Care (Signed)
Immediate Anesthesia Transfer of Care Note  Patient: Zachary BarterJohn B Carll  Procedure(s) Performed: ECT TX  Patient Location: PACU  Anesthesia Type:General  Level of Consciousness: drowsy  Airway & Oxygen Therapy: Patient Spontanous Breathing and Patient connected to face mask oxygen  Post-op Assessment: Report given to RN and Post -op Vital signs reviewed and stable  Post vital signs: Reviewed and stable  Last Vitals:  Vitals Value Taken Time  BP 130/102 09/10/2017 11:18 AM  Temp 36.4 C 09/10/2017 11:18 AM  Pulse 76 09/10/2017 11:22 AM  Resp 11 09/10/2017 11:22 AM  SpO2 99 % 09/10/2017 11:22 AM  Vitals shown include unvalidated device data.  Last Pain:  Vitals:   09/10/17 1118  TempSrc:   PainSc: Asleep         Complications: No apparent anesthesia complications

## 2017-09-10 NOTE — Progress Notes (Signed)
Recreation Therapy Notes  Date: 09/10/2017  Time: 9:30 am   Location: Craft Room   Behavioral response: N/A   Intervention Topic: Coping Skills  Discussion/Intervention: Patient did not attend group.   Clinical Observations/Feedback:  Patient did not attend group.   Eleanor Gatliff LRT/CTRS         Milani Lowenstein 09/10/2017 11:00 AM

## 2017-09-10 NOTE — Anesthesia Procedure Notes (Addendum)
Procedure Name: LMA Insertion Date/Time: 09/10/2017 11:06 AM Performed by: Alver FisherPenwarden, Amy, MD Pre-anesthesia Checklist: Patient identified, Emergency Drugs available, Suction available, Patient being monitored and Timeout performed Patient Re-evaluated:Patient Re-evaluated prior to induction Oxygen Delivery Method: Circle system utilized Preoxygenation: Pre-oxygenation with 100% oxygen Induction Type: IV induction Ventilation: Two handed mask ventilation required, Mask ventilation with difficulty and Oral airway inserted - appropriate to patient size LMA: LMA inserted LMA Size: 4.5 Number of attempts: 1 Tube secured with: Tape Dental Injury: Teeth and Oropharynx as per pre-operative assessment

## 2017-09-10 NOTE — Progress Notes (Signed)
Patient returned to unit after having ECT procedure done. Patient alert and oriented x 4. Ambulatory with a slightly unsteady gait. C/O feeling " a little freaked out"  could not explain why or what made him feel this way. Patient asked to stay in dining room with staff so that he could be monitored. Denies pain at this time. Will continue to monitor. Ate meal without difficulty, after meal pt observed resting in bed with eyes closed. Will continue to monitor.

## 2017-09-10 NOTE — Anesthesia Postprocedure Evaluation (Signed)
Anesthesia Post Note  Patient: [name]  Procedure(s) Performed: ECT TX  Patient location during evaluation: PACU Anesthesia Type: General Level of consciousness: awake and alert Pain management: pain level controlled Vital Signs Assessment: post-procedure vital signs reviewed and stable Respiratory status: spontaneous breathing, nonlabored ventilation and respiratory function stable Cardiovascular status: blood pressure returned to baseline and stable Postop Assessment: no signs of nausea or vomiting Anesthetic complications: no     Last Vitals:  Vitals:   09/10/17 1440 09/10/17 1616  BP: 124/90 124/90  Pulse: 84 84  Resp:  18  Temp: 36.5 C 36.5 C  SpO2: 97%     Last Pain:  Vitals:   09/10/17 1616  TempSrc: Oral  PainSc:                  [name]

## 2017-09-10 NOTE — [facility] Suicide Risk Assessment (Signed)
[facility] Admission Suicide Risk Assessment   Nursing information obtained from:  Patient Demographic factors:  Male, Caucasian, Unemployed Current Mental Status:  Suicidal ideation indicated by patient, Suicide plan, Plan includes specific time, place, or method, Belief that plan would result in death Loss Factors:  Decline in physical health, Financial problems / change in socioeconomic status Historical Factors:  Prior suicide attempts Risk Reduction Factors:  Living with another person, especially a relative  Total Time spent with patient: 1 hour Principal Problem: Severe recurrent major depression without psychotic features (HCC) Diagnosis:   Patient Active Problem List   Diagnosis Date Noted  . Suicide attempt (HCC) [T14.91XA] 09/10/2017  . Severe recurrent major depression without psychotic features (HCC) [F33.2]   . Posttraumatic stress disorder [F43.10]   . Pneumonia [J18.9] 03/08/2017  . COPD with acute exacerbation (HCC) [J44.1] 01/19/2017  . OSA (obstructive sleep apnea) [G47.33] 01/11/2017  . Rash [R21] 01/11/2017  . Hyponatremia [E87.1] 10/13/2016  . Hypertension [I10] 07/09/2016  . History of adenomatous polyp of colon [Z86.010] 07/09/2016  . 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 Obesity, Class II, BMI 35-39.9 [E66.9] 07/09/2016  . Edema [R60.9] 07/09/2016  . COPD (chronic obstructive pulmonary disease) (HCC) [J44.9] 02/27/2015  . Dyspnea on exertion [R06.09] 01/18/2015  . Lung mass [R91.8] 10/10/2013  . Chronic maxillary sinusitis [J32.0] 09/27/2013  . GERD (gastroesophageal reflux disease) [K21.9] 09/27/2013  . Chronic cough [R05] 06/21/2013  . Alcohol dependence (HCC) [F10.20] 07/29/2011  . Bipolar affective disorder, depressed, moderate (HCC) [F31.32] 07/29/2011    Class: Chronic   Subjective Data: 62 year old male with a history of recurrent depression admitted to behavioral health [facility] after having serious suicidal ideation and almost jumping off a bridge.   Patient continues to endorse multiple symptoms of severe major depression.  Depressed mood.  Increased appetite.  Lethargy lack of interest lack of any enjoyment suicidal ideation.  Not having any psychotic symptoms.  Patient had been compliant with medication and had not been actively abusing substances.  Patient has a history of good response to ECT.  He has been transferred to our facility for ECT treatment.  Patient has passive suicidal thoughts but no intent or plan of acting on it in the hospital and is not expressing any impulsivity or anger.  Continued Clinical Symptoms:  Alcohol Use Disorder Identification Test Final Score (AUDIT): 0 The "Alcohol Use Disorders Identification Test", Guidelines for Use in Primary Care, Second Edition.  World Science writer [facility]). Score between 0-7:  no or low risk or alcohol related problems. Score between 8-15:  moderate risk of alcohol related problems. Score between 16-19:  high risk of alcohol related problems. Score 20 or above:  warrants further diagnostic evaluation for alcohol dependence and treatment.   CLINICAL FACTORS:   Depression:   Anhedonia   Musculoskeletal: Strength & Muscle Tone: within normal limits Gait & Station: normal Patient leans: N/A  Psychiatric Specialty Exam: Physical Exam  ROS  Blood pressure (!) 131/91, pulse 80, temperature 97.8 F (36.6 C), temperature source Oral, resp. rate 18, height 5\' 10"  (1.778 m), weight 117.9 kg (260 lb), SpO2 96 %.Body mass index is 37.31 kg/m.  General Appearance: Casual  Eye Contact:  Fair  Speech:  Slow  Volume:  Decreased  Mood:  Depressed  Affect:  Constricted  Thought Process:  Goal Directed  Orientation:  Full (Time, Place, and Person)  Thought Content:  Logical  Suicidal Thoughts:  Yes.  without intent/plan  Homicidal Thoughts:  No  Memory:  Immediate;  Fair Recent;   Fair Remote;   Fair  Judgement:  Fair  Insight:  Good  Psychomotor Activity:  Decreased   Concentration:  Concentration: Fair  Recall:  [employer]  Fund of Knowledge:  Fair  Language:  Fair  Akathisia:  No  Handed:  Right  AIMS (if indicated):     Assets:  Desire for Improvement Housing Resilience  ADL's:  Intact  Cognition:  WNL  Sleep:  Number of Hours: 6.25      COGNITIVE FEATURES THAT CONTRIBUTE TO RISK:  Thought constriction (tunnel vision)    SUICIDE RISK:   Moderate:  Frequent suicidal ideation with limited intensity, and duration, some specificity in terms of plans, no associated intent, good self-control, limited dysphoria/symptomatology, some risk factors present, and identifiable protective factors, including available and accessible social support.  PLAN OF CARE: Patient is on 15-minute checks and is cooperative and lucid and motivated for improvement.  ECT treatment along with medication management, individual and group therapy and reassessment.  Constant reassessment of suicidal ideation documented prior to discharge planning.  I certify that inpatient services furnished can reasonably be expected to improve the patient's condition.   Mordecai RasmussenJohn Clapacs, MD 09/10/2017, 9:12 AM

## 2017-09-10 NOTE — Progress Notes (Signed)
Patient complains of not felling right, "I'm just freaking out." Vistaril administered for anxiety. Patient States, "I don't want to have ECT done again, I'm ready to be discharged." MD notified of patient's request. Milieu remains safe with q 15 minute safety checks.

## 2017-09-10 NOTE — Plan of Care (Signed)
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 My son is in the Special Service, my son and my grand daughter is the reason why I have not kill myself; I wanted to jump off the bridge; my medications seem not to be helping, I am here for the ECT....." NPO after MN for ECT, Pre-ECT CBG=88  Gross Skin Intact on assessment; no contrabands found on patients and in his belongings; assessment completed by Ms Guido SanderBukola, RN.  Unit guidelines, treatment agreements, expected behaviors discussed, questions encouraged, pass code established, courtesy call placed to patient's wife, Ms Carney Cornersnita Richardson [phone], unit and room orientation completed; medications given as prescribed.  Patient slept for Estimated Hours of 6.25; Precautionary checks every 15 minutes for safety maintained, room free of safety hazards, patient sustains no injury or falls during this shift.  Problem: Education: Goal: Emotional status will improve Outcome: Progressing Goal: Mental status will improve Outcome: Progressing Goal: Verbalization of understanding the information provided will improve Outcome: Progressing   Problem: Activity: Goal: Sleeping patterns will improve Outcome: Progressing   Problem: Safety: Goal: Periods of time without injury will increase Outcome: Progressing   Problem: Education: Goal: Ability to make informed decisions regarding treatment will improve Outcome: Progressing   Problem: Self-Concept: Goal: Ability to disclose and discuss suicidal ideas will improve Outcome: Progressing   Problem: Activity: Goal: Will identify at least one activity in  which they can participate Outcome: Progressing   Problem: Coping: Goal: Ability to interact with others will improve Outcome: Progressing   Problem: Coping: Goal: Will verbalize feelings Outcome: Progressing

## 2017-09-10 NOTE — [facility] Group Notes (Signed)
09/10/2017 1PM  Type of Therapy and Topic:  Group Therapy:  Feelings around Relapse and Recovery  Participation Level:  Did Not Attend   Description of Group:    Patients in this group will discuss emotions they experience before and after a relapse. They will process how experiencing these feelings, or avoidance of experiencing them, relates to having a relapse. Facilitator will guide patients to explore emotions they have related to recovery. Patients will be encouraged to process which emotions are more powerful. They will be guided to discuss the emotional reaction significant others in their lives may have to patients' relapse or recovery. Patients will be assisted in exploring ways to respond to the emotions of others without this contributing to a relapse.  Therapeutic Goals: 1. Patient will identify two or more emotions that lead to a relapse for them 2. Patient will identify two emotions that result when they relapse 3. Patient will identify two emotions related to recovery 4. Patient will demonstrate ability to communicate their needs through discussion and/or role plays   Summary of Patient Progress: Patient was encouraged and invited to attend group. Patient did not attend group. Social worker will continue to encourage group participation in the future.      Therapeutic Modalities:   Cognitive Behavioral Therapy Solution-Focused Therapy Assertiveness Training Relapse Prevention Therapy   Jovoni Borkenhagen, LCSW 09/10/2017 2:47 PM    
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
## 2017-09-10 NOTE — Anesthesia Post-op Follow-up Note (Signed)
Anesthesia QCDR form completed.        

## 2017-09-10 NOTE — Procedures (Signed)
ECT SERVICES Physician's Interval Evaluation & Treatment Note  Patient Identification: Zachary BarterJohn B [name] MRN:  [id number] Date of Evaluation:  09/10/2017 TX #: 1  MADRS: 32  MMSE: 30  P.E. Findings:  Heart and lungs clear.  Vitals stable.  No significant physical findings.  Neurologically intact.  Psychiatric Interval Note:  Mood is depressed down negative not actively intending suicide no psychotic symptoms.  Subjective:  Patient is a 62 y.o. male seen for evaluation for Electroconvulsive Therapy. Depressed mood  Treatment Summary:   [x]   Right Unilateral             []  Bilateral   % Energy : 0.3 ms 65%   Impedance: 1090 ohms  Seizure Energy Index: 628 V squared  Postictal Suppression Index: 42%  Seizure Concordance Index: 54%  Medications  Pre Shock: Toradol 30 mg Brevital 170 mg succinylcholine 170 mg  Post Shock: Versed 2 mg  Seizure Duration: EMG 18 seconds EEG 29-second   Comments: Next time we need to add Robinul 0.2 mg also continue Brevital at 170 but increase succinylcholine to 200 mg continue with plan for Versed of 2 mg afterwards  Lungs:  [x]   Clear to auscultation               []  Other:   Heart:    [x]   Regular rhythm             []  irregular rhythm    [x]   Previous H&P reviewed, patient examined and there are NO CHANGES                 []   Previous H&P reviewed, patient examined and there are changes noted.   Mordecai RasmussenJohn Clapacs, MD 7/26/201910:53 AM

## 2017-09-10 NOTE — Anesthesia Preprocedure Evaluation (Signed)
Anesthesia Evaluation  Patient identified by MRN, date of birth, ID band Patient awake    Reviewed: Allergy & Precautions, NPO status , Patient's Chart, lab work & pertinent test results  History of Anesthesia Complications Negative for: history of anesthetic complications  Airway Mallampati: III  TM Distance: >3 FB Neck ROM: Full    Dental no notable dental hx.    Pulmonary sleep apnea , COPD,  COPD inhaler, former smoker,    breath sounds clear to auscultation- rhonchi (-) wheezing      Cardiovascular hypertension, (-) CAD, (-) Past MI, (-) Cardiac Stents and (-) CABG  Rhythm:Regular Rate:Normal - Systolic murmurs and - Diastolic murmurs    Neuro/Psych PSYCHIATRIC DISORDERS Anxiety Depression Bipolar Disorder negative neurological ROS     GI/Hepatic Neg liver ROS, GERD  ,  Endo/Other  negative endocrine ROSneg diabetes  Renal/GU negative Renal ROS     Musculoskeletal  (+) Arthritis ,   Abdominal (+) + obese,   Peds  Hematology negative hematology ROS (+)   Anesthesia Other Findings Past Medical History: No date: ADD (attention deficit disorder) No date: Anxiety     Comment:  Panic No date: Bipolar disorder (HCC) No date: GERD (gastroesophageal reflux disease)     Comment:  ? they thought GERD caused cough.  No date: Mental disorder     Comment:  ADD, bipolar 04/12/2013: Pneumonia No date: Shortness of breath No date: Substance abuse (HCC)   Reproductive/Obstetrics                             Anesthesia Physical Anesthesia Plan  ASA: II  Anesthesia Plan: General   Post-op Pain Management:    Induction: Intravenous  PONV Risk Score and Plan: 1 and Ondansetron  Airway Management Planned: Mask  Additional Equipment:   Intra-op Plan:   Post-operative Plan:   Informed Consent: I have reviewed the patients History and Physical, chart, labs and discussed the procedure  including the risks, benefits and alternatives for the proposed anesthesia with the patient or authorized representative who has indicated his/her understanding and acceptance.   Dental advisory given  Plan Discussed with: CRNA and Anesthesiologist  Anesthesia Plan Comments:         Anesthesia Quick Evaluation

## 2017-09-10 NOTE — H&P (Signed)
Psychiatric Admission Assessment Adult  Patient Identification: [name] MRN:  [id number] Date of Evaluation:  09/10/2017 Chief Complaint:  major depressive disorder Principal Diagnosis: Severe recurrent major depression without psychotic features (HCC) Diagnosis:   Patient Active Problem List   Diagnosis Date Noted  . Suicide attempt (HCC) [T14.91XA] 09/10/2017  . Severe recurrent major depression without psychotic features (HCC) [F33.2]   . Posttraumatic stress disorder [F43.10]   . Pneumonia [J18.9] 03/08/2017  . COPD with acute exacerbation (HCC) [J44.1] 01/19/2017  . OSA (obstructive sleep apnea) [G47.33] 01/11/2017  . Rash [R21] 01/11/2017  . Hyponatremia [E87.1] 10/13/2016  . Hypertension [I10] 07/09/2016  . History of adenomatous polyp of colon [Z86.010] 07/09/2016  . 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 Obesity, Class II, BMI 35-39.9 [E66.9] 07/09/2016  . Edema [R60.9] 07/09/2016  . COPD (chronic obstructive pulmonary disease) (HCC) [J44.9] 02/27/2015  . Dyspnea on exertion [R06.09] 01/18/2015  . Lung mass [R91.8] 10/10/2013  . Chronic maxillary sinusitis [J32.0] 09/27/2013  . GERD (gastroesophageal reflux disease) [K21.9] 09/27/2013  . Chronic cough [R05] 06/21/2013  . Alcohol dependence (HCC) [F10.20] 07/29/2011  . Bipolar affective disorder, depressed, moderate (HCC) [F31.32] 07/29/2011    Class: Chronic   History of Present Illness: This is a 62 year old man with history of recurrent severe depression.  He was admitted to behavioral health [facility] after presenting with active suicidal intention and plan.  Patient has been transferred to our facility voluntarily with a plan to begin ECT treatment.  Patient reports severe depression for at least the last 2 months.  Energy level and activity very low.  Feeling hopeless.  Feeling like he cannot do anything.  No enjoyment in life.  Sleeps adequately with his current medicine.  Feels like he overeats when he is  depressed.  Denies any hallucinations.  Denies homicidal ideation.  States that he had not been drinking or abusing any substances recently and that he had been compliant with outpatient medication.  Major life stresses include financial problems and interpersonal difficulties in his family.  Patient is agreeable to beginning a trial of ECT treatment  Patient lives with his wife and daughter and daughter's extended family.  Has not been able to work in most of a year.  Has stressful relationships with his family especially his wife  Medical history: History of chronic hypertension gastric reflux COPD.  No history of MI no history of stroke no history of any surgery or procedures on his head.  Substance abuse history: Patient reports that there is a distant history of alcohol abuse but he stopped drinking years ago.  Denies any other substance abuse  Family history: States that he had one uncle who committed suicide Associated Signs/Symptoms: Depression Symptoms:  depressed mood, anhedonia, psychomotor retardation, fatigue, feelings of worthlessness/guilt, difficulty concentrating, hopelessness, suicidal thoughts with specific plan, anxiety, loss of energy/fatigue, (Hypo) Manic Symptoms:  None Anxiety Symptoms:  Excessive Worry, Social Anxiety, Psychotic Symptoms:  Denies any PTSD Symptoms: Negative Total Time spent with patient: 1 hour  Past Psychiatric History: Patient has had multiple episodes of major depression and has had inpatient hospitalization in the past.  Multiple antidepressants over the years with only partial response.  [facility] found very [facility].  He has also had transcranial magnetic stimulation which she found beneficial although short-lived within the past year.  Does have a past history of active suicidal ideation.  No clear history of any psychotic symptoms or mania.  Is the  patient at risk to self? Yes.    Has  the patient been a risk to self in the past 6 months? Yes.    Has the patient been a risk to self within the distant past? Yes.    Is the patient a risk to others? No.  Has the patient been a risk to others in the past 6 months? No.  Has the patient been a risk to others within the distant past? No.   Prior Inpatient Therapy:   Prior Outpatient Therapy:    Alcohol Screening: 1. How often do you have a drink containing alcohol?: Never 2. How many drinks containing alcohol do you have on a typical day when you are drinking?: 1 or 2 3. How often do you have six or more drinks on one occasion?: Never AUDIT-C Score: 0 4. How often during the last year have you found that you were not able to stop drinking once you had started?: Never 5. How often during the last year have you failed to do what was normally expected from you becasue of drinking?: Never 6. How often during the last year have you needed a first drink in the morning to get yourself going after a heavy drinking session?: Never 7. How often during the last year have you had a feeling of guilt of remorse after drinking?: Never 8. How often during the last year have you been unable to remember what happened the night before because you had been drinking?: Never 9. Have you or someone else been injured as a result of your drinking?: No 10. Has a relative or friend or a doctor or another health worker been concerned about your drinking or suggested you cut down?: No Alcohol Use Disorder Identification Test Final Score (AUDIT): 0 Intervention/Follow-up: AUDIT Score <7 follow-up not indicated Substance Abuse History in the last 12 months:  No. Consequences of Substance Abuse: Negative Previous Psychotropic Medications: Yes  Psychological Evaluations: Yes  Past Medical History:  Past Medical History:  Diagnosis Date  . ADD (attention deficit disorder)   . Anxiety    Panic  . Bipolar disorder (HCC)   . GERD (gastroesophageal reflux  disease)    ? they thought GERD caused cough.   . Mental disorder    ADD, bipolar  . Pneumonia 04/12/2013  . Shortness of breath   . Substance abuse [facility])     Past Surgical History:  Procedure Laterality Date  . APPENDECTOMY  1988  . COLONOSCOPY    . TONSILLECTOMY    . VIDEO BRONCHOSCOPY Bilateral 10/10/2013   Procedure: VIDEO BRONCHOSCOPY WITH FLUORO;  Surgeon: Oretha Milch, MD;  Location: [facility] ENDOSCOPY;  Service: Cardiopulmonary;  Laterality: Bilateral;  . VIDEO BRONCHOSCOPY N/A 10/13/2013   Procedure: VIDEO BRONCHOSCOPY, REMOVAL OF ENDOBRONCHIAL LESION, PROBABLE FOREIGN BODY;  Surgeon: Delight Ovens, MD;  Location: [facility] OR;  Service: Thoracic;  Laterality: N/A;  . VIDEO BRONCHOSCOPY N/A 11/28/2013   Procedure: VIDEO BRONCHOSCOPY;  Surgeon: Delight Ovens, MD;  Location: [facility] OR;  Service: Thoracic;  Laterality: N/A;  bronchoscopy for removal of foreign body  . VIDEO BRONCHOSCOPY Bilateral 03/10/2017   Procedure: VIDEO BRONCHOSCOPY WITH FLUORO;  Surgeon: Oretha Milch, MD;  Location: [facility] ENDOSCOPY;  Service: Cardiopulmonary;  Laterality: Bilateral;   Family History:  Family History  Problem Relation Age of Onset  . Breast cancer Mother   . Alcohol abuse Father        unclear cause of death. 54  . Colon cancer Father   .  Alcohol abuse Sister        substance as well  . Alcohol abuse Brother        substance abuse  . Alcohol abuse Sister        substance abuse   Family Psychiatric  History: Patient says he thinks that his mother had psychiatric problems but they were never diagnosed or treated.  Had one uncle who killed himself. Tobacco Screening: Have you used any form of tobacco in the last 30 days? (Cigarettes, Smokeless Tobacco, Cigars, and/or Pipes): Yes Tobacco use, Select all that apply: 5 or more cigarettes per day Are you interested in Tobacco Cessation Medications?: No, patient refused Counseled patient on smoking cessation including recognizing danger situations,  developing coping skills and basic information about quitting provided: Yes Social History:  Social History   Substance and Sexual Activity  Alcohol Use No   Comment: none at all     Social History   Substance and Sexual Activity  Drug Use No    Additional Social History:                           Allergies:   Allergies  Allergen Reactions  . Omeprazole Diarrhea and Nausea And Vomiting   Lab Results:  Results for orders placed or performed during the hospital encounter of 09/09/17 (from the past 48 hour(s))  Glucose, capillary     Status: None   Collection Time: 09/10/17  6:20 AM  Result Value Ref Range   Glucose-Capillary 88 70 - 99 mg/dL    Blood Alcohol level:  Lab Results  Component Value Date   ETH <10 09/02/2017   ETH <11 07/28/2011    Metabolic Disorder Labs:  Lab Results  Component Value Date   HGBA1C 5.3 07/09/2016   No results found for: PROLACTIN Lab Results  Component Value Date   CHOL 137 08/09/2015   TRIG 46 08/09/2015   HDL 44 08/09/2015   LDLCALC 84 08/09/2015    Current Medications: Current Facility-Administered Medications  Medication Dose Route Frequency Provider Last Rate Last Dose  . 0.9 %  sodium chloride infusion  500 mL Intravenous Once Jissel Slavens, Jermario T, MD      . acetaminophen (TYLENOL) tablet 650 mg  650 mg Oral Q6H PRN Darrious Youman, Braxten T, MD      . albuterol (PROVENTIL HFA;VENTOLIN HFA) 108 (90 Base) MCG/ACT inhaler 2 puff  2 puff Inhalation Q6H PRN Ashunti Schofield, Narek T, MD      . alum & mag hydroxide-simeth (MAALOX/MYLANTA) 200-200-20 MG/5ML suspension 30 mL  30 mL Oral Q4H PRN Aithana Kushner, Akshar T, MD      . amLODipine (NORVASC) tablet 10 mg  10 mg Oral Daily Nezzie Manera, Jackquline Denmark, MD   10 mg at 09/10/17 0835  . ferrous sulfate tablet 325 mg  325 mg Oral QHS Kelli Egolf, Jackquline Denmark, MD   325 mg at 09/09/17 2142  . fluticasone furoate-vilanterol (BREO ELLIPTA) 100-25 MCG/INH 1 puff  1 puff Inhalation Daily Joslyne Marshburn, Jackquline Denmark, MD   1 puff at 09/10/17  0834   And  . umeclidinium bromide (INCRUSE ELLIPTA) 62.5 MCG/INH 1 puff  1 puff Inhalation Daily Kippy Gohman, Jackquline Denmark, MD   1 puff at 09/10/17 0834  . hydrochlorothiazide (MICROZIDE) capsule 12.5 mg  12.5 mg Oral Daily Karma Hiney, Nazim T, MD   12.5 mg at 09/10/17 0836  . hydrOXYzine (ATARAX/VISTARIL) tablet 25 mg  25 mg Oral TID PRN Nero Sawatzky, Jackquline Denmark, MD  25 mg at 09/09/17 2159  . losartan (COZAAR) tablet 100 mg  100 mg Oral Daily Chaniah Cisse, Jackquline Denmark, MD   100 mg at 09/10/17 0837  . magnesium hydroxide (MILK OF MAGNESIA) suspension 30 mL  30 mL Oral Daily PRN Real Cona, Jobie T, MD      . Melatonin TABS 5 mg  5 mg Oral QHS Theodore Virgin, Jackquline Denmark, MD   5 mg at 09/09/17 2155  . traZODone (DESYREL) tablet 100 mg  100 mg Oral QHS PRN Pucilowska, Jolanta B, MD   100 mg at 09/09/17 2142  . vortioxetine HBr (TRINTELLIX) tablet 10 mg  10 mg Oral QHS Yamir Carignan, Jackquline Denmark, MD   10 mg at 09/09/17 2155   PTA Medications: Medications Prior to Admission  Medication Sig Dispense Refill Last Dose  . acetaminophen (TYLENOL) 325 MG tablet Take 2 tablets (650 mg total) by mouth every 6 (six) hours as needed for mild pain.     Marland Kitchen albuterol (PROAIR HFA) 108 (90 Base) MCG/ACT inhaler Inhale 2 puffs into the lungs every 6 (six) hours as needed for wheezing or shortness of breath.     Marland Kitchen albuterol (PROVENTIL) (2.5 MG/3ML) 0.083% nebulizer solution Take 3 mLs (2.5 mg total) by nebulization every 6 (six) hours as needed for wheezing or shortness of breath. 75 mL 12   . alum & mag hydroxide-simeth (MAALOX/MYLANTA) 200-200-20 MG/5ML suspension Take 30 mLs by mouth every 4 (four) hours as needed for indigestion. 355 mL 0   . amLODipine (NORVASC) 10 MG tablet Take 1 tablet (10 mg total) by mouth daily. For high blood pressure     . fluticasone furoate-vilanterol (BREO ELLIPTA) 100-25 MCG/INH AEPB Inhale 1 puff into the lungs daily. For SOB     . gabapentin (NEURONTIN) 300 MG capsule Take 2 capsules (600 mg total) by mouth 3 (three) times daily. For  agitation     . hydrochlorothiazide (MICROZIDE) 12.5 MG capsule Take 1 capsule (12.5 mg total) by mouth daily. For high blood pressure     . hydrOXYzine (ATARAX/VISTARIL) 25 MG tablet Take 1 tablet (25 mg total) by mouth 3 (three) times daily as needed for anxiety. 1 tablet 0   . losartan (COZAAR) 100 MG tablet Take 1 tablet (100 mg total) by mouth daily. For high blood 1 tablet 0   . magnesium hydroxide (MILK OF MAGNESIA) 400 MG/5ML suspension Take 30 mLs by mouth daily as needed for mild constipation. 360 mL 0   . Melatonin 5 MG TABS Take 1 tablet (5 mg total) by mouth at bedtime. For sleep  0   . OXcarbazepine (TRILEPTAL) 150 MG tablet Take 1 tablet (150 mg total) by mouth 2 (two) times daily. For mood stabilization     . umeclidinium bromide (INCRUSE ELLIPTA) 62.5 MCG/INH AEPB Inhale 1 puff into the lungs daily. For COPD     . vortioxetine HBr (TRINTELLIX) 10 MG TABS tablet Take 1 tablet (10 mg total) by mouth at bedtime. For depression 1 tablet 0     Musculoskeletal: Strength & Muscle Tone: within normal limits Gait & Station: normal Patient leans: N/A  Psychiatric Specialty Exam: Physical Exam  Nursing note and vitals reviewed. Constitutional: He appears well-developed and well-nourished.  HENT:  Head: Normocephalic and atraumatic.  Eyes: Pupils are equal, round, and reactive to light. Conjunctivae are normal.  Neck: Normal range of motion.  Cardiovascular: Regular rhythm and normal heart sounds.  Respiratory: Effort normal. No respiratory distress.  GI: Soft.  Musculoskeletal: Normal range of motion.  Neurological: He is alert.  Skin: Skin is warm and dry.  Psychiatric: Judgment normal. His affect is blunt. His speech is delayed. He is slowed. Cognition and memory are normal. He exhibits a depressed mood. He expresses suicidal ideation. He expresses no suicidal plans.    Review of Systems  Constitutional: Negative.   HENT: Negative.   Eyes: Negative.   Respiratory:  Negative.   Cardiovascular: Negative.   Gastrointestinal: Negative.   Musculoskeletal: Negative.   Skin: Negative.   Neurological: Positive for headaches.  Psychiatric/Behavioral: Positive for depression and suicidal ideas. Negative for hallucinations, memory loss and substance abuse. The patient is nervous/anxious. The patient does not have insomnia.     Blood pressure (!) 131/91, pulse 80, temperature 97.8 F (36.6 C), temperature source Oral, resp. rate 18, height 5\' 10"  (1.778 m), weight 117.9 kg (260 lb), SpO2 96 %.Body mass index is 37.31 kg/m.  General Appearance: Casual  Eye Contact:  Fair  Speech:  Slow  Volume:  Decreased  Mood:  Depressed  Affect:  Constricted  Thought Process:  Disorganized  Orientation:  Full (Time, Place, and Person)  Thought Content:  Logical  Suicidal Thoughts:  Yes.  without intent/plan  Homicidal Thoughts:  No  Memory:  Immediate;   Fair Recent;   Fair Remote;   Fair  Judgement:  Good  Insight:  Good  Psychomotor Activity:  Decreased  Concentration:  Concentration: Fair  Recall:  [employer] of Knowledge:  Fair  Language:  Fair  Akathisia:  No  Handed:  Right  AIMS (if indicated):     Assets:  Desire for Improvement Housing Resilience Social Support  ADL's:  Intact  Cognition:  WNL  Sleep:  Number of Hours: 6.25    Treatment Plan Summary: Daily contact with patient to assess and evaluate symptoms and progress in treatment, Medication management and Plan Patient has been admitted to my service with a specific plan to begin ECT treatment.  We will continue antidepressant medication as previously started.  I have discontinued the Trileptal because of its potential to interfere with ECT as well as its lack of any history of benefit for this particular patient.  He will continue on standard 15-minute checks.  Engage in individual and group therapy on the unit.  Daily reassessment including assessment of suicidal ideation.  Management of  hypertension and COPD as previously.  Observation Level/Precautions:  15 minute checks  Laboratory:  Chemistry Profile  Psychotherapy:    Medications:    Consultations:    Discharge Concerns:    Estimated LOS:  Other:     Physician Treatment Plan for Primary Diagnosis: Severe recurrent major depression without psychotic features (HCC) Long Term Goal(s): Improvement in symptoms so as ready for discharge  Short Term Goals: Ability to verbalize feelings will improve and Ability to disclose and discuss suicidal ideas  Physician Treatment Plan for Secondary Diagnosis: Principal Problem:   Severe recurrent major depression without psychotic features (HCC) Active Problems:   COPD (chronic obstructive pulmonary disease) (HCC)   Hypertension   Suicide attempt (HCC)  Long Term Goal(s): Improvement in symptoms so as ready for discharge  Short Term Goals: Ability to demonstrate self-control will improve and Ability to identify and develop effective coping behaviors will improve  I certify that inpatient services furnished can reasonably be expected to improve the patient's condition.    Mordecai Rasmussen, MD 7/26/20199:14 AM

## 2017-09-10 NOTE — Progress Notes (Signed)
Recreation Therapy Notes  INPATIENT RECREATION THERAPY ASSESSMENT  Patient Details Name: [name] MRN: [id number] DOB: [birth year] Today's Date: 09/10/2017       Information Obtained From: (Patient refused assessment and stated he is to freaked out from ECT right now. He explained that he would like to stop ECT and  be discharged.)  Able to Participate in Assessment/Interview:    Patient Presentation:    Reason for Admission (Per Patient):    Patient Stressors:    Coping Skills:      Leisure Interests (2+):     Frequency of Recreation/Participation:    Awareness of Community Resources:     [employer] Resources:     Current Use:    If no, Barriers?:    Expressed Interest in [employer]Community Resource Information:    [place]     Patient Main Form of Transportation:    Patient Strengths:     Patient Identified Areas of Improvement:     Patient Goal for Hospitalization:     Current SI (including self-harm):     Current HI:     Current AVH:    Staff Intervention Plan:    Consent to Intern Participation:    [name] 09/10/2017, 2:54 PM

## 2017-09-10 NOTE — Plan of Care (Signed)
Patient alert and oriented x 4. Present on the unit ambulating with a steady gait. Went for ECT treatment today, remained NPO until he departed the unit. Patient complained of anxiety and worrying about the cost of the ECT procedure. Denies SI/HI/AVH at this time. Does not interact well with his peers. Does not verbalize positive feelings about himself, speaks negatively about his wife at times. Medication compliant and milieu remains safe with q 15 minute safety checks.

## 2017-09-10 NOTE — Tx Team (Signed)
Initial Treatment Plan 09/10/2017 12:31 AM [name] Udall ZOX:[id number]    PATIENT STRESSORS: Health problems Marital or family conflict Traumatic event   PATIENT STRENGTHS: Ability for insight Active sense of humor Average or above average intelligence Capable of independent living Motivation for treatment/growth   PATIENT IDENTIFIED PROBLEMS: Mood Instability  Suicidal Ideation "I wanted to jump off the bridge, my depression is worsened by my wife..."  Ineffective Coping Skills  "My antidepressants are no longer working, I am here for the ECT..."               DISCHARGE CRITERIA:  Improved stabilization in mood, thinking, and/or behavior Motivation to continue treatment in a less acute level of care Safe-care adequate arrangements made Verbal commitment to aftercare and medication compliance  PRELIMINARY DISCHARGE PLAN: Outpatient therapy Return to previous living arrangement  PATIENT/FAMILY INVOLVEMENT: This treatment plan has been presented to and reviewed with the patient, [name] Molder.  The patient and family have been given the opportunity to ask questions and make suggestions.  Cleotis NipperAbiodun T Jakira Mcfadden, RN 09/10/2017, 12:31 AM

## 2017-09-11 MED ORDER — TRAZODONE HCL 100 MG PO TABS
100.0000 mg | ORAL_TABLET | Freq: Every day | ORAL | Status: DC
  Administered 2017-09-11 – 2017-09-12 (×2): 100 mg via ORAL
  Filled 2017-09-11 (×2): qty 1

## 2017-09-11 NOTE — [facility] Group Notes (Signed)
LCSW Group Therapy Note  09/11/2017 1:15pm  Type of Therapy and Topic:  Group Therapy:  Cognitive Distortions  Participation Level:  Active   Description of Group:    Patients in this group will be introduced to the topic of cognitive distortions.  Patients will identify and describe cognitive distortions, describe the feelings these distortions create for them.  Patients will identify one or more situations in their personal life where they have cognitively distorted thinking and will verbalize challenging this cognitive distortion through positive thinking skills.  Patients will practice the skill of using positive affirmations to challenge cognitive distortions using affirmation cards.    Therapeutic Goals:  1. Patient will identify two or more cognitive distortions they have used 2. Patient will identify one or more emotions that stem from use of a cognitive distortion 3. Patient will demonstrate use of a positive affirmation to counter a cognitive distortion through discussion and/or role play. 4. Patient will describe one way cognitive distortions can be detrimental to wellness   Summary of Patient Progress: The patient reported he feels "good." Patients were introduced to the topic of cognitive distortions. The patient was able to identify and describe cognitive distortions, described the feelings these distortions create for him. Patient identified a situation in his personal life where he has cognitively distorted thinking and was able to verbalize and challenged this cognitive distortion through positive thinking skills. Patient was able to provide support and validation to other group members.     Therapeutic Modalities:   Cognitive Behavioral Therapy Motivational Interviewing   Kahmari Herard  CUEBAS-COLON, LCSW 09/11/2017 3:54 PM

## 2017-09-11 NOTE — Progress Notes (Signed)
[facility]  09/11/2017 6:22 PM [name]  MRN:  [id number] Subjective:  Pt seen and chart reviewed. Pt reports feeling better with increased antidepressant,Vortioxetine.  However, stated that [name] doesn't want to have ECT yesterday due to the panic attack yesterday while [name] was waking up from it.   Denied SI, and slept well stating "as long as I have my Trazodone".   Principal Problem: Severe recurrent major depression without psychotic features (HCC) Diagnosis:   Patient Active Problem List   Diagnosis Date Noted  . Suicide attempt (HCC) [T14.91XA] 09/10/2017  . Severe recurrent major depression without psychotic features (HCC) [F33.2]   . Posttraumatic stress disorder [F43.10]   . Pneumonia [J18.9] 03/08/2017  . COPD with acute exacerbation (HCC) [J44.1] 01/19/2017  . OSA (obstructive sleep apnea) [G47.33] 01/11/2017  . Rash [R21] 01/11/2017  . Hyponatremia [E87.1] 10/13/2016  . Hypertension [I10] 07/09/2016  . History of adenomatous polyp of colon [Z86.010] 07/09/2016  . 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 Obesity, Class II, BMI 35-39.9 [E66.9] 07/09/2016  . Edema [R60.9] 07/09/2016  . COPD (chronic obstructive pulmonary disease) (HCC) [J44.9] 02/27/2015  . Dyspnea on exertion [R06.09] 01/18/2015  . Lung mass [R91.8] 10/10/2013  . Chronic maxillary sinusitis [J32.0] 09/27/2013  . GERD (gastroesophageal reflux disease) [K21.9] 09/27/2013  . Chronic cough [R05] 06/21/2013  . Alcohol dependence (HCC) [F10.20] 07/29/2011  . Bipolar affective disorder, depressed, moderate (HCC) [F31.32] 07/29/2011    Class: Chronic   Total Time spent with patient: 20 minutes  Past Psychiatric History: Patient has a past history of suicidal behavior of severe depression of PTSD and multiple medical problems  Past Medical History:  Past Medical History:  Diagnosis Date  . ADD (attention deficit disorder)   . Anxiety    Panic  . Bipolar disorder (HCC)   . GERD (gastroesophageal  reflux disease)    ? they thought GERD caused cough.   . Mental disorder    ADD, bipolar  . Pneumonia 04/12/2013  . Shortness of breath   . Substance abuse [facility](HCC)     Past Surgical History:  Procedure Laterality Date  . APPENDECTOMY  1988  . COLONOSCOPY    . TONSILLECTOMY    . VIDEO BRONCHOSCOPY Bilateral 10/10/2013   Procedure: VIDEO BRONCHOSCOPY WITH FLUORO;  Surgeon: Oretha Milchakesh Alva V, MD;  [facility];  Service: Cardiopulmonary;  Laterality: Bilateral;  . VIDEO BRONCHOSCOPY N/A 10/13/2013   Procedure: VIDEO BRONCHOSCOPY, REMOVAL OF ENDOBRONCHIAL LESION, PROBABLE FOREIGN BODY;  Surgeon: Delight OvensEdward B Gerhardt, MD;  Location: [facility] OR;  Service: Thoracic;  Laterality: N/A;  . VIDEO BRONCHOSCOPY N/A 11/28/2013   Procedure: VIDEO BRONCHOSCOPY;  Surgeon: Delight OvensEdward B Gerhardt, MD;  [facility];  Service: Thoracic;  Laterality: N/A;  bronchoscopy for removal of foreign body  . VIDEO BRONCHOSCOPY Bilateral 03/10/2017   Procedure: VIDEO BRONCHOSCOPY WITH FLUORO;  Surgeon: Oretha MilchAlva, Rakesh V, MD;  [facility];  Service: Cardiopulmonary;  Laterality: Bilateral;   Family History:  Family History  Problem Relation Age of Onset  . Breast cancer Mother   . Alcohol abuse Father        unclear cause of death. [id number]  . Colon cancer Father   . Alcohol abuse Sister        substance as well  . Alcohol abuse Brother        substance abuse  . Alcohol abuse Sister        substance abuse   Family Psychiatric  History: See previous note Social  History:  Social History   Substance and Sexual Activity  Alcohol Use No   Comment: none at all     Social History   Substance and Sexual Activity  Drug Use No    Social History   Socioeconomic History  . Marital status: Married    Spouse name: Not on file  . Number of children: Not on file  . Years of education: Not on file  . Highest education level: Not on file  Occupational History  . Not on file  Social Needs  . Financial resource  strain: Not on file  . Food insecurity:    Worry: Not on file    Inability: Not on file  . Transportation needs:    Medical: Not on file    Non-medical: Not on file  Tobacco Use  . Smoking status: Former Smoker    Packs/day: 1.00    Years: 20.00    Pack years: 20.00    Types: Cigarettes    Last attempt to quit: 02/16/1993    Years since quitting: 24.5  . Smokeless tobacco: Never Used  Substance and Sexual Activity  . Alcohol use: No    Comment: none at all  . Drug use: No  . Sexual activity: Yes  Lifestyle  . Physical activity:    Days per week: Not on file    Minutes per session: Not on file  . Stress: Not on file  Relationships  . Social connections:    Talks on phone: Not on file    Gets together: Not on file    Attends religious service: Not on file    Active member of club or organization: Not on file    Attends meetings of clubs or organizations: Not on file    Relationship status: Not on file  Other Topics Concern  . Not on file  Social History Narrative   Married. Lives with wife. 2 children. Soon to be grandfather 07/2016.       Freelance work/semi retired. Editing/writing.       Hobbies: enjoys Diplomatic Services operational officer poetry   Additional Social History:   Sleep: Fair  Appetite:  Fair  Current Medications: Current Facility-Administered Medications  Medication Dose Route Frequency Provider Last Rate Last Dose  . acetaminophen (TYLENOL) tablet 650 mg  650 mg Oral Q6H PRN Clapacs, Caydin T, MD      . albuterol (PROVENTIL HFA;VENTOLIN HFA) 108 (90 Base) MCG/ACT inhaler 2 puff  2 puff Inhalation Q6H PRN Clapacs, Abou T, MD      . alum & mag hydroxide-simeth (MAALOX/MYLANTA) 200-200-20 MG/5ML suspension 30 mL  30 mL Oral Q4H PRN Clapacs, Monroe T, MD      . amLODipine (NORVASC) tablet 10 mg  10 mg Oral Daily Clapacs, Jackquline Denmark, MD   10 mg at 09/11/17 0837  . ferrous sulfate tablet 325 mg  325 mg Oral QHS Clapacs, Jackquline Denmark, MD   325 mg at 09/10/17 2156  . fluticasone furoate-vilanterol  (BREO ELLIPTA) 100-25 MCG/INH 1 puff  1 puff Inhalation Daily Clapacs, Jackquline Denmark, MD   1 puff at 09/11/17 0836   And  . umeclidinium bromide (INCRUSE ELLIPTA) 62.5 MCG/INH 1 puff  1 puff Inhalation Daily Clapacs, Jackquline Denmark, MD   1 puff at 09/11/17 0836  . gabapentin (NEURONTIN) tablet 600 mg  600 mg Oral TID Clapacs, Jackquline Denmark, MD   600 mg at 09/11/17 1618  . hydrochlorothiazide (MICROZIDE) capsule 12.5 mg  12.5 mg Oral Daily Clapacs, Jackquline Denmark, MD  12.5 mg at 09/11/17 0837  . hydrOXYzine (ATARAX/VISTARIL) tablet 25 mg  25 mg Oral TID PRN Clapacs, Jackquline Denmark, MD   25 mg at 09/11/17 0839  . losartan (COZAAR) tablet 100 mg  100 mg Oral Daily Clapacs, Jackquline Denmark, MD   100 mg at 09/11/17 0837  . magnesium hydroxide (MILK OF MAGNESIA) suspension 30 mL  30 mL Oral Daily PRN Clapacs, Teal T, MD      . Melatonin TABS 5 mg  5 mg Oral QHS Clapacs, Jackquline Denmark, MD   5 mg at 09/10/17 2158  . traZODone (DESYREL) tablet 100 mg  100 mg Oral QHS PRN Pucilowska, Jolanta B, MD   100 mg at 09/10/17 2156  . vortioxetine HBr (TRINTELLIX) tablet 10 mg  10 mg Oral QHS Clapacs, Jackquline Denmark, MD   10 mg at 09/10/17 2156    Lab Results:  Results for orders placed or performed during the hospital encounter of 09/09/17 (from the past 48 hour(s))  Glucose, capillary     Status: None   Collection Time: 09/10/17  6:20 AM  Result Value Ref Range   Glucose-Capillary 88 70 - 99 mg/dL    Blood Alcohol level:  Lab Results  Component Value Date   ETH <10 09/02/2017   ETH <11 07/28/2011    Metabolic Disorder Labs: Lab Results  Component Value Date   HGBA1C 5.3 07/09/2016   No results found for: PROLACTIN Lab Results  Component Value Date   CHOL 137 08/09/2015   TRIG 46 08/09/2015   HDL 44 08/09/2015   LDLCALC 84 08/09/2015    Physical Findings: AIMS: Facial and Oral Movements Muscles of Facial Expression: None, normal Lips and Perioral Area: None, normal Jaw: None, normal Tongue: None, normal,Extremity Movements Upper (arms, wrists,  hands, fingers): None, normal Lower (legs, knees, ankles, toes): None, normal, Trunk Movements Neck, shoulders, hips: None, normal, Overall Severity Severity of abnormal movements (highest score from questions above): None, normal Incapacitation due to abnormal movements: None, normal Patient's awareness of abnormal movements (rate only patient's report): No Awareness, Dental Status Current problems with teeth and/or dentures?: No Does patient usually wear dentures?: No  CIWA:    COWS:     Musculoskeletal: Strength & Muscle Tone: within normal limits Gait & Station: normal Patient leans: N/A  Psychiatric Specialty Exam: Physical Exam  Nursing note and vitals reviewed. Constitutional: [name] appears well-developed and well-nourished.  HENT:  Head: Normocephalic and atraumatic.  Eyes: Pupils are equal, round, and reactive to light. Conjunctivae are normal.  Neck: Normal range of motion.  Cardiovascular: Regular rhythm and normal heart sounds.  Respiratory: Effort normal. No respiratory distress.  GI: Soft.  Musculoskeletal: Normal range of motion.  Neurological: [name] is alert.  Skin: Skin is warm and dry.  Psychiatric: His mood appears anxious. His speech is delayed. [name] is slowed. [name] expresses impulsivity. [name] exhibits a depressed mood. [name] expresses no homicidal and no suicidal ideation. [name] exhibits abnormal recent memory.    Review of Systems  Constitutional: Negative.   HENT: Negative.   Eyes: Negative.   Respiratory: Negative.   Cardiovascular: Negative.   Gastrointestinal: Negative.   Musculoskeletal: Negative.   Skin: Negative.   Neurological: Negative.   Psychiatric/Behavioral: Positive for depression. Negative for hallucinations, memory loss, substance abuse and suicidal ideas. The patient is nervous/anxious. The patient does not have insomnia.     Blood pressure 102/71, pulse 88, temperature 97.7 F (36.5 C), temperature source Oral, resp. rate 16, height 5\' 10"  (1.778 m),  weight 118.4 kg (261 lb), SpO2 96 %.Body mass index is 37.45 kg/m.  General Appearance: Casual and Fairly Groomed  Eye Contact:  Good  Speech:  Clear and Coherent and Normal Rate  Volume:  Normal  Mood:  Anxious and Depressed  Affect:  Congruent, Constricted and Depressed  Thought Process:  Coherent and Goal Directed  Orientation:  Full (Time, Place, and Person)  Thought Content:  Logical  Suicidal Thoughts:  No  Homicidal Thoughts:  No  Memory:  Immediate;   Fair Recent;   Fair Remote;   Fair  Judgement:  Impaired  Insight:  Fair  Psychomotor Activity:  Normal  Concentration:  Concentration: Fair and Attention Span: Fair  Recall:  [employer] of Knowledge:  Fair  Language:  Fair  Akathisia:  No  Handed:  Right  AIMS (if indicated):     Assets:  Communication Skills Desire for Improvement Housing  ADL's:  Intact  Cognition:  WNL  Sleep:  Number of Hours: 6.15     Treatment Plan Summary: Daily contact with patient to assess and evaluate symptoms and progress in treatment and Medication management   Major Depressive disorder, severe -- could not tolerate ECT, thus, will likely stop on Monday.  -- continue Vortixotine 10mg  daily.  -- continue trazodone 100mg  qhs.  -- continue Gabapentin 300mg  TID.   Disposition -- defer to primary team.    Amunique Neyra, MD 09/11/2017, 6:22 PM

## 2017-09-11 NOTE — Plan of Care (Signed)
Patient is alert and oriented x 4.Continues to endorse anxiety to this Clinical research associatewriter. PRN dose of Vistaril administered this morning with effective results. Remains isolative to his room, only comes out for meals and medication. Not observed interacting with his peers. Denies pain and SI/HI/AVH. Milieu remains safe with q 15 minute safety checks.

## 2017-09-12 ENCOUNTER — Other Ambulatory Visit: Payer: Self-pay | Admitting: Psychiatry

## 2017-09-12 MED ORDER — HYDROCHLOROTHIAZIDE 25 MG PO TABS
25.0000 mg | ORAL_TABLET | Freq: Every day | ORAL | Status: DC
  Administered 2017-09-13: 25 mg via ORAL
  Filled 2017-09-12: qty 1

## 2017-09-12 NOTE — Progress Notes (Addendum)
Patient stayed in the milieu and was cooperative. Complained of anxiety and restlessness and upper back pain and received medications. Patient attended group, had a snack and had no other concerns. Currently in bed sleeping. No sign of distress. Safety precautions maintained.  0600: Patient slept all night and had no distress. Currently out of bed, performed hygiene and sitting in room sitting. Pleasant and cooperative. Encouragements and support provided. Safety precautions maintained.

## 2017-09-12 NOTE — Progress Notes (Signed)
Patient has been in the milieu, active and cooperative. Alert and oriented and denying thoughts of self harm. Denying hallucinations. Attended group and had  HS snack. Complained of anxiety and received Vistaril along with his HS medications. Pleasant upon assessment and has no other concerns so far. Staff continue to provide support and encouragements. Safety precautions maintained.

## 2017-09-12 NOTE — Plan of Care (Signed)
Active in the milieu, cooperative and compliant with treatment.  

## 2017-09-12 NOTE — Progress Notes (Signed)
Patient just stated to this writer "I don't think I'm going to take that ECT tomorrow, I did it Friday and it was horrible".

## 2017-09-12 NOTE — Plan of Care (Signed)
Patient verbalizes understanding of the general information that's been provided to him and has not voiced any further questions/concerns at this time. Patient denies SI/HI/AVH as well as any depression at this time. Patient does endorse anxiety, rating it a "2/10", stating to this writer "I always feel slightly anxious, but not so much if I take my medicine". Patient stated that he slept "pretty good" last night and has been present in the milieu throughout the day without any issues. Patient has remained free from injury thus far and has the ability to make informed decisions regarding his health-care. Patient has interacted well with staff and other members on the unit without any issues. Patient has been observed attending today's social work group as well as going outside to the courtyard. Patient remains safe on the unit at this time.  Problem: Education: Goal: Knowledge of [place] [facility] Education information/materials will improve Outcome: Progressing Goal: Emotional status will improve Outcome: Progressing Goal: Mental status will improve Outcome: Progressing Goal: Verbalization of understanding the information provided will improve Outcome: Progressing   Problem: Activity: Goal: Sleeping patterns will improve Outcome: Progressing   Problem: Safety: Goal: Periods of time without injury will increase Outcome: Progressing   Problem: Education: Goal: Ability to make informed decisions regarding treatment will improve Outcome: Progressing   Problem: Self-Concept: Goal: Ability to disclose and discuss suicidal ideas will improve Outcome: Progressing   Problem: Activity: Goal: Will identify at least one activity in which they can participate Outcome: Progressing   Problem: Coping: Goal: Ability to interact with others will improve Outcome: Progressing   Problem: Education: Goal: Utilization of techniques to improve thought processes will improve Outcome: Progressing    Problem: Coping: Goal: Will verbalize feelings Outcome: Progressing   Problem: Self-Concept: Goal: Will verbalize positive feelings about self Outcome: Progressing Goal: Level of anxiety will decrease Outcome: Progressing

## 2017-09-12 NOTE — Plan of Care (Signed)
Visible in the milieu, compliant with treatment plan

## 2017-09-12 NOTE — [facility] Group Notes (Signed)
LCSW Group Therapy Note 09/12/2017 1:15pm  Type of Therapy and Topic: Group Therapy: Feelings Around Returning Home & Establishing a Supportive Framework and Supporting Oneself When Supports Not Available  Participation Level: Active  Description of Group:  Patients first processed thoughts and feelings about upcoming discharge. These included fears of upcoming changes, lack of change, new living environments, judgements and expectations from others and overall stigma of mental health issues. The group then discussed the definition of a supportive framework, what that looks and feels like, and how do to discern it from an unhealthy non-supportive network. The group identified different types of supports as well as what to do when your family/friends are less than helpful or unavailable  Therapeutic Goals  1. Patient will identify one healthy supportive network that they can use at discharge. 2. Patient will identify one factor of a supportive framework and how to tell it from an unhealthy network. 3. Patient able to identify one coping skill to use when they do not have positive supports from others. 4. Patient will demonstrate ability to communicate their needs through discussion and/or role plays.  Summary of Patient Progress:  Patient reported he feels "good." Pt engaged during group session. As patients processed their anxiety about discharge and described healthy supports patient shared he is ready to be discharge he stated that he can feel the medication is working and while being in the hospital he was able to make living arrangements. Patient mentioned some of his friends as his main support system.  Patients identified at least one self-care tool they were willing to use after discharge; meditation and exercise.   Therapeutic Modalities Cognitive Behavioral Therapy Motivational Interviewing   [name]  CUEBAS-COLON, LCSW 09/12/2017 9:50 AM

## 2017-09-12 NOTE — Progress Notes (Signed)
[facility] MD Progress Note  09/12/2017 11:36 AM [name]  MRN:  [id number] Subjective:  Pt seen and chart reviewed. Pt continue report feeling well, tolerates Vortioxetine and no SI. [name] doesn't want anymore ECT.  [name] slept well with Trazodone.  [name] complained about swollen ankle, and is encouraged to walk more on the unit. [name] is already on HCTZ, and his BP is 148/88 today. Will increase HCTZ slightly.    Principal Problem: Severe recurrent major depression without psychotic features (HCC) Diagnosis:   Patient Active Problem List   Diagnosis Date Noted  . Suicide attempt (HCC) [T14.91XA] 09/10/2017  . Severe recurrent major depression without psychotic features (HCC) [F33.2]   . Posttraumatic stress disorder [F43.10]   . Pneumonia [J18.9] 03/08/2017  . COPD with acute exacerbation (HCC) [J44.1] 01/19/2017  . OSA (obstructive sleep apnea) [G47.33] 01/11/2017  . Rash [R21] 01/11/2017  . Hyponatremia [E87.1] 10/13/2016  . Hypertension [I10] 07/09/2016  . History of adenomatous polyp of colon [Z86.010] 07/09/2016  . 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 Obesity, Class II, BMI 35-39.9 [E66.9] 07/09/2016  . Edema [R60.9] 07/09/2016  . COPD (chronic obstructive pulmonary disease) (HCC) [J44.9] 02/27/2015  . Dyspnea on exertion [R06.09] 01/18/2015  . Lung mass [R91.8] 10/10/2013  . Chronic maxillary sinusitis [J32.0] 09/27/2013  . GERD (gastroesophageal reflux disease) [K21.9] 09/27/2013  . Chronic cough [R05] 06/21/2013  . Alcohol dependence (HCC) [F10.20] 07/29/2011  . Bipolar affective disorder, depressed, moderate (HCC) [F31.32] 07/29/2011    Class: Chronic   Total Time spent with patient: 20 minutes  Past Psychiatric History: Patient has a past history of suicidal behavior of severe depression of PTSD and multiple medical problems  Past Medical History:  Past Medical History:  Diagnosis Date  . ADD (attention deficit disorder)   . Anxiety    Panic  . Bipolar disorder (HCC)    . GERD (gastroesophageal reflux disease)    ? they thought GERD caused cough.   . Mental disorder    ADD, bipolar  . Pneumonia 04/12/2013  . Shortness of breath   . Substance abuse [facility])     Past Surgical History:  Procedure Laterality Date  . APPENDECTOMY  1988  . COLONOSCOPY    . TONSILLECTOMY    . VIDEO BRONCHOSCOPY Bilateral 10/10/2013   Procedure: VIDEO BRONCHOSCOPY WITH FLUORO;  Surgeon: Oretha Milch, MD;  Location: [facility] ENDOSCOPY;  Service: Cardiopulmonary;  Laterality: Bilateral;  . VIDEO BRONCHOSCOPY N/A 10/13/2013   Procedure: VIDEO BRONCHOSCOPY, REMOVAL OF ENDOBRONCHIAL LESION, PROBABLE FOREIGN BODY;  Surgeon: Delight Ovens, MD;  Location: [facility] OR;  Service: Thoracic;  Laterality: N/A;  . VIDEO BRONCHOSCOPY N/A 11/28/2013   Procedure: VIDEO BRONCHOSCOPY;  Surgeon: Delight Ovens, MD;  Location: [facility] OR;  Service: Thoracic;  Laterality: N/A;  bronchoscopy for removal of foreign body  . VIDEO BRONCHOSCOPY Bilateral 03/10/2017   Procedure: VIDEO BRONCHOSCOPY WITH FLUORO;  Surgeon: Oretha Milch, MD;  Location: [facility] ENDOSCOPY;  Service: Cardiopulmonary;  Laterality: Bilateral;   Family History:  Family History  Problem Relation Age of Onset  . Breast cancer Mother   . Alcohol abuse Father        unclear cause of death. 2  . Colon cancer Father   . Alcohol abuse Sister        substance as well  . Alcohol abuse Brother        substance abuse  . Alcohol abuse Sister        substance abuse   Family Psychiatric  History: See previous note Social History:  Social History   Substance and Sexual Activity  Alcohol Use No   Comment: none at all     Social History   Substance and Sexual Activity  Drug Use No    Social History   Socioeconomic History  . Marital status: Married    Spouse name: Not on file  . Number of children: Not on file  . Years of education: Not on file  . Highest education level: Not on file  Occupational History  . Not on file  Social Needs   . Financial resource strain: Not on file  . Food insecurity:    Worry: Not on file    Inability: Not on file  . Transportation needs:    Medical: Not on file    Non-medical: Not on file  Tobacco Use  . Smoking status: Former Smoker    Packs/day: 1.00    Years: 20.00    Pack years: 20.00    Types: Cigarettes    Last attempt to quit: 02/16/1993    Years since quitting: 24.5  . Smokeless tobacco: Never Used  Substance and Sexual Activity  . Alcohol use: No    Comment: none at all  . Drug use: No  . Sexual activity: Yes  Lifestyle  . Physical activity:    Days per week: Not on file    Minutes per session: Not on file  . Stress: Not on file  Relationships  . Social connections:    Talks on phone: Not on file    Gets together: Not on file    Attends religious service: Not on file    Active member of club or organization: Not on file    Attends meetings of clubs or organizations: Not on file    Relationship status: Not on file  Other Topics Concern  . Not on file  Social History Narrative   Married. Lives with wife. 2 children. Soon to be grandfather 07/2016.       Freelance work/semi retired. Editing/writing.       Hobbies: enjoys Diplomatic Services operational officer poetry   Additional Social History:   Sleep: Fair  Appetite:  Fair  Current Medications: Current Facility-Administered Medications  Medication Dose Route Frequency Provider Last Rate Last Dose  . acetaminophen (TYLENOL) tablet 650 mg  650 mg Oral Q6H PRN Clapacs, Jackquline Denmark, MD   650 mg at 09/11/17 2159  . albuterol (PROVENTIL HFA;VENTOLIN HFA) 108 (90 Base) MCG/ACT inhaler 2 puff  2 puff Inhalation Q6H PRN Clapacs, Joal T, MD      . alum & mag hydroxide-simeth (MAALOX/MYLANTA) 200-200-20 MG/5ML suspension 30 mL  30 mL Oral Q4H PRN Clapacs, Giles T, MD      . amLODipine (NORVASC) tablet 10 mg  10 mg Oral Daily Clapacs, Jackquline Denmark, MD   10 mg at 09/12/17 0820  . ferrous sulfate tablet 325 mg  325 mg Oral QHS Clapacs, Jackquline Denmark, MD   325 mg at  09/11/17 2159  . fluticasone furoate-vilanterol (BREO ELLIPTA) 100-25 MCG/INH 1 puff  1 puff Inhalation Daily Clapacs, Erminio T, MD   1 puff at 09/12/17 0820   And  . umeclidinium bromide (INCRUSE ELLIPTA) 62.5 MCG/INH 1 puff  1 puff Inhalation Daily Clapacs, Jackquline Denmark, MD   1 puff at 09/12/17 0820  . gabapentin (NEURONTIN) tablet 600 mg  600 mg Oral TID Clapacs, Jackquline Denmark, MD   600 mg at 09/12/17 0820  . hydrochlorothiazide (MICROZIDE) capsule 12.5 mg  12.5 mg Oral Daily Clapacs, Nas T, MD   12.5 mg at 09/12/17 0820  . hydrOXYzine (ATARAX/VISTARIL) tablet 25 mg  25 mg Oral TID PRN Clapacs, Jackquline Denmark, MD   25 mg at 09/12/17 1610  . losartan (COZAAR) tablet 100 mg  100 mg Oral Daily Clapacs, Jackquline Denmark, MD   100 mg at 09/12/17 0819  . magnesium hydroxide (MILK OF MAGNESIA) suspension 30 mL  30 mL Oral Daily PRN Clapacs, Jace T, MD      . Melatonin TABS 5 mg  5 mg Oral QHS Clapacs, Robbert T, MD   5 mg at 09/11/17 2200  . traZODone (DESYREL) tablet 100 mg  100 mg Oral QHS Elad Macphail, MD   100 mg at 09/11/17 2159  . vortioxetine HBr (TRINTELLIX) tablet 10 mg  10 mg Oral QHS Clapacs, Jackquline Denmark, MD   10 mg at 09/11/17 2159    Lab Results:  No results found for this or any previous visit (from the past 48 hour(s)).  Blood Alcohol level:  Lab Results  Component Value Date   ETH <10 09/02/2017   ETH <11 07/28/2011    Metabolic Disorder Labs: Lab Results  Component Value Date   HGBA1C 5.3 07/09/2016   No results found for: PROLACTIN Lab Results  Component Value Date   CHOL 137 08/09/2015   TRIG 46 08/09/2015   HDL 44 08/09/2015   LDLCALC 84 08/09/2015    Physical Findings: AIMS: Facial and Oral Movements Muscles of Facial Expression: None, normal Lips and Perioral Area: None, normal Jaw: None, normal Tongue: None, normal,Extremity Movements Upper (arms, wrists, hands, fingers): None, normal Lower (legs, knees, ankles, toes): None, normal, Trunk Movements Neck, shoulders, hips: None, normal, Overall  Severity Severity of abnormal movements (highest score from questions above): None, normal Incapacitation due to abnormal movements: None, normal Patient's awareness of abnormal movements (rate only patient's report): No Awareness, Dental Status Current problems with teeth and/or dentures?: No Does patient usually wear dentures?: No  CIWA:    COWS:     Musculoskeletal: Strength & Muscle Tone: within normal limits Gait & Station: normal Patient leans: N/A  Psychiatric Specialty Exam: Physical Exam  Nursing note and vitals reviewed. Constitutional: Solae Norling appears well-developed and well-nourished.  HENT:  Head: Normocephalic and atraumatic.  Eyes: Pupils are equal, round, and reactive to light. Conjunctivae are normal.  Neck: Normal range of motion.  Cardiovascular: Regular rhythm and normal heart sounds.  Respiratory: Effort normal. No respiratory distress.  GI: Soft.  Musculoskeletal: Normal range of motion.  Neurological: [name] is alert.  Skin: Skin is warm and dry.  Psychiatric: His mood appears anxious. His speech is delayed. Javed Cotto is slowed. Kaitlin Ardito expresses impulsivity. Wagner Tanzi exhibits a depressed mood. Rykin Route expresses no homicidal and no suicidal ideation. Christasia Angeletti exhibits abnormal recent memory.    Review of Systems  Constitutional: Negative.   HENT: Negative.   Eyes: Negative.   Respiratory: Negative.   Cardiovascular: Negative.   Gastrointestinal: Negative.   Musculoskeletal: Negative.   Skin: Negative.   Neurological: Negative.   Psychiatric/Behavioral: Positive for depression. Negative for hallucinations, memory loss, substance abuse and suicidal ideas. The patient is nervous/anxious. The patient does not have insomnia.     Blood pressure (!) 149/88, pulse 81, temperature 97.6 F (36.4 C), temperature source Oral, resp. rate 18, height 5\' 10"  (1.778 m), weight 118.4 kg (261 lb), SpO2 99 %.Body mass index is 37.45 kg/m.  General Appearance: Casual and Fairly Groomed  Eye Contact:  Good  Speech:  Clear and Coherent and Normal Rate  Volume:  Normal  Mood:  Euthymic  Affect:  Appropriate, Congruent and Full Range  Thought Process:  Coherent, Goal Directed and Linear  Orientation:  Full (Time, Place, and Person)  Thought Content:  Logical  Suicidal Thoughts:  No  Homicidal Thoughts:  No  Memory:  Immediate;   Fair Recent;   Fair Remote;   Fair  Judgement:  Fair  Insight:  Fair  Psychomotor Activity:  Normal  Concentration:  Concentration: Fair and Attention Span: Fair  Recall:  [employer]Fair  Fund of Knowledge:  Fair  Language:  Fair  Akathisia:  No  Handed:  Right  AIMS (if indicated):     Assets:  Communication Skills Desire for Improvement Housing  ADL's:  Intact  Cognition:  WNL  Sleep:  Number of Hours: 6.45     Treatment Plan Summary: Daily contact with patient to assess and evaluate symptoms and progress in treatment and Medication management   Major Depressive disorder, severe -- could not tolerate ECT, thus, will likely stop on Monday.  -- continue Vortixotine 10mg  daily.  -- continue trazodone 100mg  qhs.  -- continue Gabapentin 300mg  TID.   Hypertension -- continue amlodipine 10mg  daily. (this can cause some peripheral edema).  -- increase HCTZ to 25mg  daily from 12.5mg  due to peripheral edema.  Her BP today is 149/88   Disposition -- defer to primary team.    Trysten Bernard, MD 09/12/2017, 11:36 AM

## 2017-09-12 NOTE — Progress Notes (Signed)
D- Patient alert and oriented. Patient presents in a pleasant mood on assessment stating that he slept "pretty good" last night. Patient had some complaints of bilateral ankle swelling, in which he stated to this writer that his left is more swollen than the right. Patient rates his anxiety level a "2/10" stating "I always feel anxious, but not so much if I take my medicine". Patient denies SI, HI, AVH, and pain at this time. Patient also denies any signs/symptoms of depression. Patient's goal for today is to "meditate for an hour".   A- Scheduled medications administered to patient, per MD orders. Support and encouragement provided.  Routine safety checks conducted every 15 minutes.  Patient informed to notify staff with problems or concerns.  R- No adverse drug reactions noted. Patient contracts for safety at this time. Patient compliant with medications and treatment plan. Patient receptive, calm, and cooperative. Patient interacts well with others on the unit.  Patient remains safe at this time.

## 2017-09-13 MED ORDER — AMLODIPINE BESYLATE 5 MG PO TABS: 5.0000 mg | ORAL_TABLET | Freq: Every day | ORAL | 1 refills | Status: DC

## 2017-09-13 NOTE — Progress Notes (Signed)
Patient ID: Zachary FryJohn Boyd [name], male   DOB: 07/03/1955, 62 y.o.   MRN: [id number] CSW called multiple times and left voicemails to confirm Pt appointment at [facility] without any response.  Will follow up tomorrow.  Pt is currently seen there and reassures that he can schedule with provider easily as he has his cell number at home.  Jake SharkSara Dvid Pendry, LCSW

## 2017-09-13 NOTE — [facility] Suicide Risk Assessment (Signed)
[facility] Discharge Suicide Risk Assessment   Principal Problem: Severe recurrent major depression without psychotic features [facility]) Discharge Diagnoses:  Patient Active Problem List   Diagnosis Date Noted  . Suicide attempt (HCC) [T14.91XA] 09/10/2017  . Severe recurrent major depression without psychotic features (HCC) [F33.2]   . Posttraumatic stress disorder [F43.10]   . Pneumonia [J18.9] 03/08/2017  . COPD with acute exacerbation (HCC) [J44.1] 01/19/2017  . OSA (obstructive sleep apnea) [G47.33] 01/11/2017  . Rash [R21] 01/11/2017  . Hyponatremia [E87.1] 10/13/2016  . Hypertension [I10] 07/09/2016  . History of adenomatous polyp of colon [Z86.010] 07/09/2016  . 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 Obesity, Class II, BMI 35-39.9 [E66.9] 07/09/2016  . Edema [R60.9] 07/09/2016  . COPD (chronic obstructive pulmonary disease) (HCC) [J44.9] 02/27/2015  . Dyspnea on exertion [R06.09] 01/18/2015  . Lung mass [R91.8] 10/10/2013  . Chronic maxillary sinusitis [J32.0] 09/27/2013  . GERD (gastroesophageal reflux disease) [K21.9] 09/27/2013  . Chronic cough [R05] 06/21/2013  . Alcohol dependence (HCC) [F10.20] 07/29/2011  . Bipolar affective disorder, depressed, moderate (HCC) [F31.32] 07/29/2011    Class: Chronic    Total Time spent with patient: 30 minutes  Musculoskeletal: Strength & Muscle Tone: within normal limits Gait & Station: normal Patient leans: N/A  Psychiatric Specialty Exam: Review of Systems  Constitutional: Negative.   HENT: Negative.   Eyes: Negative.   Respiratory: Negative.   Cardiovascular: Negative.   Gastrointestinal: Negative.   Musculoskeletal: Negative.   Skin: Negative.   Neurological: Negative.   Psychiatric/Behavioral: Negative for depression, hallucinations, memory loss, substance abuse and suicidal ideas. The patient is not nervous/anxious and does not have insomnia.     Blood pressure (!) 116/93, pulse 92, temperature 98.1 F (36.7 C),  temperature source Oral, resp. rate 18, height 5\' 10"  (1.778 m), weight 118.4 kg (261 lb), SpO2 97 %.Body mass index is 37.45 kg/m.  General Appearance: Casual  Eye Contact::  Good  Speech:  Normal Rate409  Volume:  Normal  Mood:  Euthymic  Affect:  Constricted  Thought Process:  Goal Directed  Orientation:  Full (Time, Place, and Person)  Thought Content:  Logical  Suicidal Thoughts:  No  Homicidal Thoughts:  No  Memory:  Immediate;   Fair Recent;   Fair Remote;   Fair  Judgement:  Fair  Insight:  Fair  Psychomotor Activity:  Normal  Concentration:  Fair  Recall:  [employer] of Knowledge:Fair  Language: Fair  Akathisia:  Negative  Handed:  Right  AIMS (if indicated):     Assets:  Desire for Improvement Housing Resilience Social Support  Sleep:  Number of Hours: 7  Cognition: WNL  ADL's:  Intact   Mental Status Per Nursing Assessment::   On Admission:  Suicidal ideation indicated by patient, Suicide plan, Plan includes specific time, place, or method, Belief that plan would result in death  Demographic Factors:  Age 64 or older  Loss Factors: Loss of significant relationship  Historical Factors: NA  Risk Reduction Factors:   Sense of responsibility to family, Living with another person, especially a relative, Positive social support and Positive therapeutic relationship  Continued Clinical Symptoms:  Depression:   Impulsivity  Cognitive Features That Contribute To Risk:  Polarized thinking    Suicide Risk:  Mild:  Suicidal ideation of limited frequency, intensity, duration, and specificity.  There are no identifiable plans, no associated intent, mild dysphoria and related symptoms, good self-control (both objective and subjective assessment), few other risk factors, and identifiable protective factors, including  available and accessible social support.  Follow-up Information    [facility] Go on 09/13/2017.   Why:  Pt is a current patient.  Unable to  confirm appointment time, but Mr. Victory DakinRiley says he has one coming up and will be able to schedule easily. Contact information: [address] [place], [place] [address] [phone]          Plan Of Care/Follow-up recommendations:  Activity:  Activity as tolerated Diet:  Heart healthy diet Other:  Follow-up with Dr. Latrelle Dodrillbook  Braedon Chima Astorino, MD 09/13/2017, 5:17 PM

## 2017-09-13 NOTE — Discharge Summary (Signed)
Physician Discharge Summary Note  Patient:  [name] is an 62 y.o., male MRN:  [id number] DOB:  06/03/1955 Patient phone:  [phone] (home)  Patient address:   [place] [address],  Total Time spent with patient: 30 minutes  Date of Admission:  09/09/2017 Date of Discharge: September 13, 2017  Reason for Admission: Patient was admitted in transfer from behavioral health Hospital because of plan for ECT.  He had been admitted to behavioral health Hospital with severe symptoms of depression.  Principal Problem: Severe recurrent major depression without psychotic features [facility]) Discharge Diagnoses: Patient Active Problem List   Diagnosis Date Noted  . Suicide attempt ([place]) [T14.91XA] 09/10/2017  . Severe recurrent major depression without psychotic features ([place]) [F33.2]   . Posttraumatic stress disorder [F43.10]   . Pneumonia [J18.9] 03/08/2017  . COPD with acute exacerbation ([place]) [J44.1] 01/19/2017  . OSA (obstructive sleep apnea) [G47.33] 01/11/2017  . Rash [R21] 01/11/2017  . Hyponatremia [E87.1] 10/13/2016  . Hypertension [I10] 07/09/2016  . History of adenomatous polyp of colon [Z86.010] 07/09/2016  . 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 Obesity, Class II, BMI 35-39.9 [E66.9] 07/09/2016  . Edema [R60.9] 07/09/2016  . COPD (chronic obstructive pulmonary disease) ([place]) [J44.9] 02/27/2015  . Dyspnea on exertion [R06.09] 01/18/2015  . Lung mass [R91.8] 10/10/2013  . Chronic maxillary sinusitis [J32.0] 09/27/2013  . GERD (gastroesophageal reflux disease) [K21.9] 09/27/2013  . Chronic cough [R05] 06/21/2013  . Alcohol dependence ([place]) [F10.20] 07/29/2011  . Bipolar affective disorder, depressed, moderate ([place]) [F31.32] 07/29/2011    Class: Chronic    Past Psychiatric History: Patient has a chronic recurrent history of depression and anxiety.  Had a past history of positive response to ECT.  Has an outpatient provider he has a very good  relationship with.  Past Medical History:  Past Medical History:  Diagnosis Date  . ADD (attention deficit disorder)   . Anxiety    Panic  . Bipolar disorder ([place])   . GERD (gastroesophageal reflux disease)    ? they thought GERD caused cough.   . Mental disorder    ADD, bipolar  . Pneumonia 04/12/2013  . Shortness of breath   . Substance abuse [facility]     Past Surgical History:  Procedure Laterality Date  . APPENDECTOMY  1988  . COLONOSCOPY    . TONSILLECTOMY    . VIDEO BRONCHOSCOPY Bilateral 10/10/2013   Procedure: VIDEO BRONCHOSCOPY WITH FLUORO;  Surgeon: Rigoberto Noel, MD;  Location: [place];  Service: Cardiopulmonary;  Laterality: Bilateral;  . VIDEO BRONCHOSCOPY N/A 10/13/2013   Procedure: VIDEO BRONCHOSCOPY, REMOVAL OF ENDOBRONCHIAL LESION, PROBABLE FOREIGN BODY;  Surgeon: Grace Isaac, MD;  Location: [place];  Service: Thoracic;  Laterality: N/A;  . VIDEO BRONCHOSCOPY N/A 11/28/2013   Procedure: VIDEO BRONCHOSCOPY;  Surgeon: Grace Isaac, MD;  Location: [place];  Service: Thoracic;  Laterality: N/A;  bronchoscopy for removal of foreign body  . VIDEO BRONCHOSCOPY Bilateral 03/10/2017   Procedure: VIDEO BRONCHOSCOPY WITH FLUORO;  Surgeon: Rigoberto Noel, MD;  Location: [place];  Service: Cardiopulmonary;  Laterality: Bilateral;   Family History:  Family History  Problem Relation Age of Onset  . Breast cancer Mother   . Alcohol abuse Father        unclear cause of death. 20  . Colon cancer Father   . Alcohol abuse Sister        substance as well  . Alcohol abuse Brother  substance abuse  . Alcohol abuse Sister        substance abuse   Family Psychiatric  History: Alcohol abuse and several relatives Social History:  Social History   Substance and Sexual Activity  Alcohol Use No   Comment: none at all     Social History   Substance and Sexual Activity  Drug Use No    Social History   Socioeconomic History  . Marital status: Married     Spouse name: Not on file  . Number of children: Not on file  . Years of education: Not on file  . Highest education level: Not on file  Occupational History  . Not on file  Social Needs  . Financial resource strain: Not on file  . Food insecurity:    Worry: Not on file    Inability: Not on file  . Transportation needs:    Medical: Not on file    Non-medical: Not on file  Tobacco Use  . Smoking status: Former Smoker    Packs/day: 1.00    Years: 20.00    Pack years: 20.00    Types: Cigarettes    Last attempt to quit: 02/16/1993    Years since quitting: 24.5  . Smokeless tobacco: Never Used  Substance and Sexual Activity  . Alcohol use: No    Comment: none at all  . Drug use: No  . Sexual activity: Yes  Lifestyle  . Physical activity:    Days per week: Not on file    Minutes per session: Not on file  . Stress: Not on file  Relationships  . Social connections:    Talks on phone: Not on file    Gets together: Not on file    Attends religious service: Not on file    Active member of club or organization: Not on file    Attends meetings of clubs or organizations: Not on file    Relationship status: Not on file  Other Topics Concern  . Not on file  Social History Narrative   Married. Lives with wife. 2 children. Soon to be grandfather 07/2016.       Freelance work/semi retired. Editing/writing.       Hobbies: enjoys writing poetry    Hospital Course: Patient was admitted to our hospital voluntarily with a diagnosis of recurrent severe major depression without psychotic features.  Patient met with me and we agreed that ECT was an appropriate treatment given his symptom complex and previous good response.  Patient had ECT treatment on Friday the 26th.  We were unable to find specific records listing prior doses of anesthetics.  Along with anesthesia we tried to make our best estimate to appropriate amounts of anesthesia but the patient still had some excessive movement during  the seizure.  Afterwards he complained that he had felt panicky and frightened during the seizure or the treatment at some point and that he did not want to continue.  I spoke with him and tried to reassure him that anesthetic doses would be adjusted so that we would minimize or eliminate side effects but the patient continued to refuse ECT.  He refused treatment Monday morning.  Meanwhile he has consistently stated he has no suicidal ideation for several days now.  Affect is upbeat.  Patient is neatly dressed and groomed.  Interacts appropriately.  Appears to be lucid and denies any suicidal thoughts.  He states that he prefers discharge now and will be staying with a friend to  avoid the stress of his domestic situation and will follow up with his outpatient psychiatrist.  Physical Findings: AIMS: Facial and Oral Movements Muscles of Facial Expression: None, normal Lips and Perioral Area: None, normal Jaw: None, normal Tongue: None, normal,Extremity Movements Upper (arms, wrists, hands, fingers): None, normal Lower (legs, knees, ankles, toes): None, normal, Trunk Movements Neck, shoulders, hips: None, normal, Overall Severity Severity of abnormal movements (highest score from questions above): None, normal Incapacitation due to abnormal movements: None, normal Patient's awareness of abnormal movements (rate only patient's report): No Awareness, Dental Status Current problems with teeth and/or dentures?: No Does patient usually wear dentures?: No  CIWA:    COWS:     Musculoskeletal: Strength & Muscle Tone: within normal limits Gait & Station: normal Patient leans: N/A  Psychiatric Specialty Exam: Physical Exam  Nursing note and vitals reviewed. Constitutional: He appears well-developed and well-nourished.  HENT:  Head: Normocephalic and atraumatic.  Eyes: Pupils are equal, round, and reactive to light. Conjunctivae are normal.  Neck: Normal range of motion.  Cardiovascular: Normal  heart sounds.  Respiratory: Effort normal.  GI: Soft.  Musculoskeletal: Normal range of motion.  Neurological: He is alert.  Skin: Skin is warm and dry.  Psychiatric: He has a normal mood and affect. His speech is normal and behavior is normal. Judgment and thought content normal. His affect is not blunt. Cognition and memory are normal.    Review of Systems  Constitutional: Negative.   HENT: Negative.   Eyes: Negative.   Respiratory: Negative.   Cardiovascular: Negative.   Gastrointestinal: Negative.   Musculoskeletal: Negative.   Skin: Negative.   Neurological: Negative.   Psychiatric/Behavioral: Negative.     Blood pressure (!) 116/93, pulse 92, temperature 98.1 F (36.7 C), temperature source Oral, resp. rate 18, height 5' 10"  (1.778 m), weight 118.4 kg (261 lb), SpO2 97 %.Body mass index is 37.45 kg/m.  General Appearance: Fairly Groomed  Eye Contact:  Good  Speech:  Clear and Coherent  Volume:  Normal  Mood:  Euthymic  Affect:  Congruent  Thought Process:  Goal Directed  Orientation:  Full (Time, Place, and Person)  Thought Content:  Logical  Suicidal Thoughts:  No  Homicidal Thoughts:  No  Memory:  Immediate;   Fair Recent;   Fair Remote;   Fair  Judgement:  Fair  Insight:  Fair  Psychomotor Activity:  Normal  Concentration:  Concentration: Fair  Recall:  [place] of Knowledge:  Fair  Language:  Fair  Akathisia:  No  Handed:  Right  AIMS (if indicated):     Assets:  Communication Skills Desire for Improvement Financial Resources/Insurance Housing Physical Health Resilience Social Support  ADL's:  Intact  Cognition:  WNL  Sleep:  Number of Hours: 7     Have you used any form of tobacco in the last 30 days? (Cigarettes, Smokeless Tobacco, Cigars, and/or Pipes): Yes  Has this patient used any form of tobacco in the last 30 days? (Cigarettes, Smokeless Tobacco, Cigars, and/or Pipes) Yes, No  Blood Alcohol level:  Lab Results  Component Value Date    [facility] <10 09/02/2017   ETH <11 97/98/9211    Metabolic Disorder Labs:  Lab Results  Component Value Date   HGBA1C 5.3 07/09/2016   No results found for: PROLACTIN Lab Results  Component Value Date   CHOL 137 08/09/2015   TRIG 46 08/09/2015   HDL 44 08/09/2015   LDLCALC 84 08/09/2015    See Psychiatric Specialty Exam  and Suicide Risk Assessment completed by Attending Physician prior to discharge.  Discharge destination:  Home  Is patient on multiple antipsychotic therapies at discharge:  No   Has Patient had three or more failed trials of antipsychotic monotherapy by history:  No  Recommended Plan for Multiple Antipsychotic Therapies: NA  Discharge Instructions    Diet - low sodium heart healthy   Complete by:  As directed    Increase activity slowly   Complete by:  As directed      Allergies as of 09/13/2017      Reactions   Omeprazole Diarrhea, Nausea And Vomiting      Medication List    STOP taking these medications   acetaminophen 325 MG tablet Commonly known as:  TYLENOL   alum & mag hydroxide-simeth 425-956-38 MG/5ML suspension Commonly known as:  MAALOX/MYLANTA   magnesium hydroxide 400 MG/5ML suspension Commonly known as:  MILK OF MAGNESIA   OXcarbazepine 150 MG tablet Commonly known as:  TRILEPTAL     TAKE these medications     Indication  albuterol 108 (90 Base) MCG/ACT inhaler Commonly known as:  PROAIR HFA Inhale 2 puffs into the lungs every 6 (six) hours as needed for wheezing or shortness of breath.  Indication:  Asthma   albuterol (2.5 MG/3ML) 0.083% nebulizer solution Commonly known as:  PROVENTIL Take 3 mLs (2.5 mg total) by nebulization every 6 (six) hours as needed for wheezing or shortness of breath.  Indication:  Reversible Disease of Blockage in Breathing Passages   amLODipine 10 MG tablet Commonly known as:  NORVASC Take 1 tablet (10 mg total) by mouth daily. For high blood pressure What changed:  Another medication with the  same name was added. Make sure you understand how and when to take each.  Indication:  High Blood Pressure Disorder   amLODipine 5 MG tablet Commonly known as:  NORVASC Take 1 tablet (5 mg total) by mouth daily. What changed:  You were already taking a medication with the same name, and this prescription was added. Make sure you understand how and when to take each.  Indication:  High Blood Pressure Disorder   fluticasone furoate-vilanterol 100-25 MCG/INH Aepb Commonly known as:  BREO ELLIPTA Inhale 1 puff into the lungs daily. For SOB  Indication:  Chronic Obstructive Lung Disease   gabapentin 300 MG capsule Commonly known as:  NEURONTIN Take 2 capsules (600 mg total) by mouth 3 (three) times daily. For agitation  Indication:  Agitation   hydrochlorothiazide 12.5 MG capsule Commonly known as:  MICROZIDE Take 1 capsule (12.5 mg total) by mouth daily. For high blood pressure  Indication:  High Blood Pressure Disorder   hydrOXYzine 25 MG tablet Commonly known as:  ATARAX/VISTARIL Take 1 tablet (25 mg total) by mouth 3 (three) times daily as needed for anxiety.  Indication:  Feeling Anxious   losartan 100 MG tablet Commonly known as:  COZAAR Take 1 tablet (100 mg total) by mouth daily. For high blood  Indication:  High Blood Pressure Disorder   Melatonin 5 MG Tabs Take 1 tablet (5 mg total) by mouth at bedtime. For sleep  Indication:  Trouble Sleeping   umeclidinium bromide 62.5 MCG/INH Aepb Commonly known as:  INCRUSE ELLIPTA Inhale 1 puff into the lungs daily. For COPD  Indication:  Chronic Obstructive Lung Disease   vortioxetine HBr 10 MG Tabs tablet Commonly known as:  TRINTELLIX Take 1 tablet (10 mg total) by mouth at bedtime. For depression  Indication:  Major  Depressive Disorder      Follow-up Information    [employer]. Go on 09/13/2017.   Why:  Pt is a current patient.  Unable to confirm appointment time, but Mr. Macmillan says he has one coming up and will  be able to schedule easily. Contact information: [address] [place],  [address] [phone]          Follow-up recommendations:  Activity:  Activity as tolerated Diet:  Heart healthy diet Other:  Patient states he does not need any prescriptions.  He will continue his outpatient medicines as he was previously and follow-up with his outpatient psychiatrist.  He did mention the Trileptal which had been started at [facility] health but we have not been continuing that at our hospital because the intention had been for ECT.  Patient says in that case he would prefer to just stay off it and follow-up with his outpatient doctor.  Comments: See note above.  Discharged today.  Does not meet commitment criteria and has consistently denied suicidal ideation for several days.  Signed: Alethia Berthold, MD 09/13/2017, 5:23 PM

## 2017-09-13 NOTE — Progress Notes (Signed)
Bright affect, denied SI/HI/AVH, "I will be rooming with a friend, my friend has extra room, that will take pressure off... My wife is picking me up.." Met with Ms Roe Coombs and patient discharged to the custody of wife.

## 2017-09-13 NOTE — Progress Notes (Signed)
D- Patient alert and oriented. Patient presents in a pleasant mood on assessment stating that he slept "fine" last night and did not voice any major complaints to this writer at this time. Patient denies SI, HI, AVH, and pain at this time. Patient also denies any signs/symptoms of depression/anxiety stating "not really".  Patient's goal for today is to "hopefully go home, I hope the doctor's not mad at me for not doing ECT".   A- Scheduled medications administered to patient, per MD orders. Support and encouragement provided.  Routine safety checks conducted every 15 minutes.  Patient informed to notify staff with problems or concerns.  R- No adverse drug reactions noted. Patient contracts for safety at this time. Patient compliant with medications and treatment plan. Patient receptive, calm, and cooperative. Patient interacts well with others on the unit.  Patient remains safe at this time.

## 2017-09-13 NOTE — [facility] Group Notes (Signed)
LCSW Group Therapy Note   09/13/2017 1:00pm   Type of Therapy and Topic:  Group Therapy:  Overcoming Obstacles   Participation Level:  Active   Description of Group:    In this group patients will be encouraged to explore what they see as obstacles to their own wellness and recovery. They will be guided to discuss their thoughts, feelings, and behaviors related to these obstacles. The group will process together ways to cope with barriers, with attention given to specific choices patients can make. Each patient will be challenged to identify changes they are motivated to make in order to overcome their obstacles. This group will be process-oriented, with patients participating in exploration of their own experiences as well as giving and receiving support and challenge from other group members.   Therapeutic Goals: 1. Patient will identify personal and current obstacles as they relate to admission. 2. Patient will identify barriers that currently interfere with their wellness or overcoming obstacles.  3. Patient will identify feelings, thought process and behaviors related to these barriers. 4. Patient will identify two changes they are willing to make to overcome these obstacles:      Summary of Patient Progress Pt attentive and participated in group discussion.  several appropriate uses of humor in group and able to meet therapeutic goals listed above.  Identifies depression as major barrier and that this is also an obstacle in his marriage.     Therapeutic Modalities:   Cognitive Behavioral Therapy Solution Focused Therapy Motivational Interviewing Relapse Prevention Therapy  Glennon MacSara P Alexio Sroka, LCSW 09/13/2017 4:38 PM

## 2017-09-13 NOTE — Progress Notes (Signed)
Recreation Therapy Notes   Date: 09/13/2017  Time: 9:30 am   Location: Craft Room   Behavioral response: N/A   Intervention Topic: Self-esteem  Discussion/Intervention: Patient did not attend group.   Clinical Observations/Feedback:  Patient did not attend group.   Markhi Kleckner LRT/CTRS        Arya Boxley 09/13/2017 11:20 AM

## 2017-09-21 ENCOUNTER — Encounter: Payer: Self-pay | Admitting: Family Medicine

## 2017-09-21 ENCOUNTER — Ambulatory Visit: Payer: Self-pay | Admitting: Family Medicine

## 2017-09-21 ENCOUNTER — Ambulatory Visit: Payer: BLUE CROSS/BLUE SHIELD | Admitting: Family Medicine

## 2017-09-21 VITALS — BP 138/88 | HR 83 | Temp 98.3°F | Ht 70.0 in | Wt 266.8 lb

## 2017-09-21 DIAGNOSIS — I10 Essential (primary) hypertension: Secondary | ICD-10-CM

## 2017-09-21 DIAGNOSIS — M7989 Other specified soft tissue disorders: Secondary | ICD-10-CM

## 2017-09-21 NOTE — Assessment & Plan Note (Signed)
S: on repeat-  controlled on amlodipine 5mg  in addition to  losartan 100mg . In hospital was on  hctz 12.5mg  but he didn't continue at discharge- we had previously stopped thsi med due to hypontremia. Diastolic is much improved from June visit though.  BP Readings from Last 3 Encounters:  09/21/17 138/88  09/13/17 (!) 116/93  09/09/17 137/90  A/P: We discussed blood pressure goal of <140/90. Continue current meds:  Has had hyponatremia in the past so hesitant to add hctz back in though if sodium above 135 today would consider perhaps 5 days of treatment as may help with edema as well

## 2017-09-21 NOTE — Progress Notes (Signed)
Subjective:  [name] is a 62 y.o. year old very pleasant male patient who presents for/with See problem oriented charting ROS- no increased SOB from baseline asthma, no chest pain. Does have bilateral leg swelling L >r   Past Medical History-  Patient Active Problem List   Diagnosis Date Noted  . Hyponatremia 10/13/2016    Priority: High  . COPD (chronic obstructive pulmonary disease) (H[facility]) 02/27/2015    Priority: High  . Alcohol dependence ([facility]) 07/29/2011    Priority: High  . OSA (obstructive sleep apnea) 01/11/2017    Priority: Medium  . Hypertension 07/09/2016    Priority: Medium  . History of adenomatous polyp of colon 07/09/2016    Priority: Medium  . Hand arthritis 07/09/2016    Priority: Medium  . Chronic maxillary sinusitis 09/27/2013    Priority: Medium  . Bipolar affective disorder, depressed, moderate ([facility]) 07/29/2011    Priority: Medium    Class: Chronic  . Obesity, Class II, BMI 35-39.9 07/09/2016    Priority: Low  . Edema 07/09/2016    Priority: Low  . Dyspnea on exertion 01/18/2015    Priority: Low  . Lung mass 10/10/2013    Priority: Low  . GERD (gastroesophageal reflux disease) 09/27/2013    Priority: Low  . Chronic cough 06/21/2013    Priority: Low  . Suicide attempt ([facility]) 09/10/2017  . Severe recurrent major depression without psychotic features ([facility])   . Posttraumatic stress disorder   . Pneumonia 03/08/2017  . COPD with acute exacerbation ([facility]) 01/19/2017  . Rash 01/11/2017    Medications- reviewed and updated Current Outpatient Medications  Medication Sig Dispense Refill  . albuterol (PROAIR HFA) 108 (90 Base) MCG/ACT inhaler Inhale 2 puffs into the lungs every 6 (six) hours as needed for wheezing or shortness of breath.    Marland Kitchen albuterol (PROVENTIL) (2.5 MG/3ML) 0.083% nebulizer solution Take 3 mLs (2.5 mg total) by nebulization every 6 (six) hours as needed for wheezing or shortness of breath. 75 mL 12  . amLODipine (NORVASC) 5 MG  tablet Take 1 tablet (5 mg total) by mouth daily. 30 tablet 1  . fluticasone furoate-vilanterol (BREO ELLIPTA) 100-25 MCG/INH AEPB Inhale 1 puff into the lungs daily. For SOB    . gabapentin (NEURONTIN) 300 MG capsule Take 2 capsules (600 mg total) by mouth 3 (three) times daily. For agitation    . hydrOXYzine (ATARAX/VISTARIL) 25 MG tablet Take 1 tablet (25 mg total) by mouth 3 (three) times daily as needed for anxiety. 1 tablet 0  . losartan (COZAAR) 100 MG tablet Take 1 tablet (100 mg total) by mouth daily. For high blood 1 tablet 0  . Melatonin 5 MG TABS Take 1 tablet (5 mg total) by mouth at bedtime. For sleep  0  . umeclidinium bromide (INCRUSE ELLIPTA) 62.5 MCG/INH AEPB Inhale 1 puff into the lungs daily. For COPD    . vortioxetine HBr (TRINTELLIX) 10 MG TABS tablet Take 1 tablet (10 mg total) by mouth at bedtime. For depression 1 tablet 0   No current facility-administered medications for this visit.     Objective: BP 138/88   Pulse 83   Temp 98.3 F (36.8 C) (Oral)   Ht 5\' 10"  (1.778 m)   Wt 266 lb 12.8 oz (121 kg)   SpO2 96%   BMI 38.28 kg/m  Gen: NAD, resting comfortably Some drainage In pharynx. Nares slightly edematous CV: RRR no murmurs rubs or gallops Lungs: CTAB no crackles, wheeze, rhonchi Abdomen: soft/nontender/nondistended/normal bowel  sounds.  Ext: 1+ edema on right leg, 2+ pitting edema on left with some calf pain as well as some varicose veins on left and none on right Skin: warm, dry  Assessment/Plan:  No SI today - he reports phq9 elevation for question 9 was while hospitalized and that has resolved  Left leg swelling - Plan: D-Dimer, Quantitative, Basic metabolic panel S:  noted extensive leg swelling in left lower leg while hospitalized for depression. He states it is slightly better with elevation but persists. Some mild calf pain. No history blood clots. He reports intermittent edema in both legs but this seems worse than normal. In my notes I have  noted 1+ edema or nonpitting edema in the past.  A/P: I suspect he has some mild worsening of baseline intermittent edema. With that being said will get d dimer to rule out DVT. If that is positive would get ultrasound. Was likely less mobile . We discussed conservative care at home for venous insufficiency if workup negative. Does have some cough but suspect allergies- to trial flonase. Lungs were clear. Doubt pneumonia or fluid overload systemically such as CHF.   Hypertension S: on repeat-  controlled on amlodipine 5mg  in addition to  losartan 100mg . In hospital was on  hctz 12.5mg  but he didn't continue at discharge- we had previously stopped thsi med due to hypontremia. Diastolic is much improved from June visit though.  BP Readings from Last 3 Encounters:  09/21/17 138/88  09/13/17 (!) 116/93  09/09/17 137/90  A/P: We discussed blood pressure goal of <140/90. Continue current meds:  Has had hyponatremia in the past so hesitant to add hctz back in though if sodium above 135 today would consider perhaps 5 days of treatment as may help with edema as well   Lab/Order associations: Left leg swelling - Plan: D-Dimer, Quantitative, Basic metabolic panel  Essential hypertension  Return precautions advised.  Tana ConchStephen Adelae Yodice, MD

## 2017-09-21 NOTE — Patient Instructions (Addendum)
Health Maintenance Due  Topic Date Due  . INFLUENZA VACCINE - Will await until October 09/16/2017   Please stop by lab before you go If your d dimer is elevated we will get an ultrasound before the weekend to rule out blood clot  Continue leg elevation  If you have worsening cough or shortness of breath see us back immediately (so we can evaluate for possible clot in lungs though doubt at this point)  Continue losartan 100mg  and amlodipine 5mg . If sodium is ok perhaps we could try 5 days of hydrochlorothiazide to help get some fluid off

## 2017-09-21 NOTE — Telephone Encounter (Signed)
Pt c/o left calf, ankle and foot edema. Pt states the swelling worsened 2 weeks ago. Edema is moderate in severity. Pt stated that there is no redness. Pt stated the swelling is not painful to touch but is achy. Pt does not know what is causing his leg edema. Pt stated he has had leg edema before but the left foot is more swollen than usual. Pt denies chest pain, difficulty breathing. Pt was stated that he is walking 2 miles per day to help with the swelling.  Care advice given per protocol. Pt verbalized understanding. Disposition is to see physician  within 24 hours. Pt accepted appointment for  2:45 pm today with PCP.  Reason for Disposition . [1] MODERATE leg swelling (e.g., swelling extends up to knees) AND [2] new onset or worsening  Answer Assessment - Initial Assessment Questions 1. ONSET: "When did the swelling start?" (e.g., minutes, hours, days)     2 weeks ago 2. LOCATION: "What part of the leg is swollen?"  "Are both legs swollen or just one leg?"     Left ankle calf and foot  3. SEVERITY: "How bad is the swelling?" (e.g., localized; mild, moderate, severe)  - Localized - small area of swelling localized to one leg  - MILD pedal edema - swelling limited to foot and ankle, pitting edema < 1/4 inch (6 mm) deep, rest and elevation eliminate most or all swelling  - MODERATE edema - swelling of lower leg to knee, pitting edema > 1/4 inch (6 mm) deep, rest and elevation only partially reduce swelling  - SEVERE edema - swelling extends above knee, facial or hand swelling present      Moderate  Can see ankle bone ridge but not very much 4. REDNESS: "Does the swelling look red or infected?"     no 5. PAIN: "Is the swelling painful to touch?" If so, ask: "How painful is it?"   (Scale 1-10; mild, moderate or severe)     No- has an ache to where the swelling is at  6. FEVER: "Do you have a fever?" If so, ask: "What is it, how was it measured, and when did it start?"      no 7. CAUSE: "What do  you think is causing the leg swelling?"     Pt does not know 8. MEDICAL HISTORY: "Do you have a history of heart failure, kidney disease, liver failure, or cancer?"     no 9. RECURRENT SYMPTOM: "Have you had leg swelling before?" If so, ask: "When was the last time?" "What happened that time?"     Yes-per pt they are always swollen but the left foot is more swollen than usual 10. OTHER SYMPTOMS: "Do you have any other symptoms?" (e.g., chest pain, difficulty breathing)       No- 11. PREGNANCY: "Is there any chance you are pregnant?" "When was your last menstrual period?"       n/a  Protocols used: LEG SWELLING AND EDEMA-A-AH

## 2017-09-22 LAB — BASIC METABOLIC PANEL
BUN: 11 mg/dL (ref 6–23)
CHLORIDE: 99 meq/L (ref 96–112)
CO2: 28 mEq/L (ref 19–32)
Calcium: 9.3 mg/dL (ref 8.4–10.5)
Creatinine, Ser: 0.9 mg/dL (ref 0.40–1.50)
GFR: 90.23 mL/min (ref >60.00)
Glucose, Bld: 98 mg/dL (ref 70–99)
POTASSIUM: 4.4 meq/L (ref 3.5–5.1)
SODIUM: 133 meq/L — AB (ref 135–145)

## 2017-09-22 LAB — D-DIMER, QUANTITATIVE: D-Dimer, Quant: 0.41 mcg/mL FEU (ref <0.50)

## 2017-09-27 ENCOUNTER — Other Ambulatory Visit: Payer: Self-pay | Admitting: Family Medicine

## 2017-11-09 ENCOUNTER — Encounter: Payer: Self-pay | Admitting: Family Medicine

## 2017-11-09 ENCOUNTER — Ambulatory Visit: Payer: BLUE CROSS/BLUE SHIELD | Admitting: Family Medicine

## 2017-11-09 VITALS — BP 134/72 | HR 87 | Temp 97.9°F | Ht 70.0 in | Wt 268.0 lb

## 2017-11-09 DIAGNOSIS — M722 Plantar fascial fibromatosis: Secondary | ICD-10-CM | POA: Diagnosis not present

## 2017-11-09 DIAGNOSIS — M7989 Other specified soft tissue disorders: Secondary | ICD-10-CM | POA: Diagnosis not present

## 2017-11-09 NOTE — Progress Notes (Signed)
   Subjective:  Zachary FryJohn Boyd [name] is a 62 y.o. male who presents today with a chief complaint of leg swelling.   HPI:  Leg Swelling, chronic problem Several month to year history.  Was evaluated for this about a month ago.  Had work-up at that time including d-dimer which was negative.  Symptoms have waxed and waned since then.  Worsened recently over the last 2 days.  Predominantly located to his left lower extremity.  Also has feelings of achiness and tiredness to his left leg after exertion.  States that his left leg feels like " it ran 1000 miles" after only doing a modest amount of exertion.  Heel pain, chronic problem, worsening Patient lives with pain in the left wrist is worsened recently as well.  No specific treatments tried.  No weakness or numbness.  No obvious rashes or other areas of skin breakdown.  Pain is much worse with the first few steps in the morning and then gradually improves.  ROS: Per HPI  PMH: He reports that he quit smoking about 24 years ago. His smoking use included cigarettes. He has a 20.00 pack-year smoking history. He has never used smokeless tobacco. He reports that he does not drink alcohol or use drugs.  Objective:  Physical Exam: BP 134/72 (BP Location: Left Arm, Patient Position: Sitting, Cuff Size: Normal)   Pulse 87   Temp 97.9 F (36.6 C) (Oral)   Ht 5\' 10"  (1.778 m)   Wt 268 lb (121.6 kg)   SpO2 97%   BMI 38.45 kg/m   Gen: NAD, resting comfortably CV: RRR with no murmurs appreciated Pulm: NWOB, CTAB with no crackles, wheezes, or rhonchi MSK: - Left leg: 2+ pitting edema to knee.  Homans negative.  Neurovascularly intact distally.  Cap refill intact.  DP and PT pulses not palpable -Left foot: No deformities.  Tender to palpation along plantar fascia insertion the calcaneus  Assessment/Plan:  Left leg swelling Likely secondary to venous insufficiency however he does have some subjective symptoms possibly consistent with arterial disease.  We  will check ABI to rule out PAD.  Also check lower extremity Doppler to rule out chronic DVT.  If above is negative, would recommend compression stockings and possibly diuretic therapy however would be hesitant given his history of hyponatremia.   Left heel pain Consistent with plantar fasciitis.  We discussed and handout was given discussing conservative measures including good arch support, use of frozen water bottle/frozen tennis balls, anti-inflammatories as needed, and stretching exercises.  Discussed reasons to return to care.  Katina Degreealeb M. Jimmey RalphParker, MD 11/09/2017 5:17 PM

## 2017-11-09 NOTE — Patient Instructions (Signed)
It was very nice to see you today!  I think you have plantar fasciitis in your foot. Please work on the exercises and make sure you have good arch support.  We will check an ultrasound of your leg to make sure you are getting good blood flow. We will contact you with the results once they are available.   Take care, Dr Jimmey RalphParker

## 2017-11-11 ENCOUNTER — Ambulatory Visit (HOSPITAL_BASED_OUTPATIENT_CLINIC_OR_DEPARTMENT_OTHER)
Admission: RE | Admit: 2017-11-11 | Discharge: 2017-11-11 | Disposition: A | Discharge status: Home / Self Care | Payer: BLUE CROSS/BLUE SHIELD | Source: Ambulatory Visit | Attending: Family Medicine | Admitting: Family Medicine

## 2017-11-11 ENCOUNTER — Ambulatory Visit (HOSPITAL_COMMUNITY)
Admission: RE | Admit: 2017-11-11 | Discharge: 2017-11-11 | Disposition: A | Discharge status: Home / Self Care | Payer: BLUE CROSS/BLUE SHIELD | Source: Ambulatory Visit | Attending: Family Medicine | Admitting: Family Medicine

## 2017-11-11 DIAGNOSIS — M7989 Other specified soft tissue disorders: Secondary | ICD-10-CM

## 2017-11-11 NOTE — Progress Notes (Signed)
Vascular lab preliminary  LLE venous duplex negative for DVT. ABI is WNL. Farrel Demark, RDMS, RVT

## 2017-11-12 NOTE — Progress Notes (Signed)
Please inform patient of the following:  His ultrasound was normal. He is getting good blood flow to his legs and he definitively does not have a blood clot. He should work on keeping his legs elevated and avoiding salt. It is also ok for him to use a compression stocking as needed. If no improvement with that, would recommend following up with PCP to discuss other treatment options.  Katina Degree. Jimmey Ralph, MD 11/12/2017 8:20 PM

## 2017-11-25 ENCOUNTER — Ambulatory Visit (INDEPENDENT_AMBULATORY_CARE_PROVIDER_SITE_OTHER): Payer: BLUE CROSS/BLUE SHIELD

## 2017-11-25 DIAGNOSIS — Z23 Encounter for immunization: Secondary | ICD-10-CM

## 2017-11-25 NOTE — Patient Instructions (Signed)
There are no preventive care reminders to display for this patient.  Depression screen [facility] 2/9 09/21/2017 11/12/2016 11/12/2016  Decreased Interest 2 1 2   Down, Depressed, Hopeless 2 2 1   PHQ - 2 Score 4 3 3   Altered sleeping 1 1 -  Tired, decreased energy 2 3 -  Change in appetite 0 2 -  Feeling bad or failure about yourself  2 2 -  Trouble concentrating 2 2 -  Moving slowly or fidgety/restless 1 2 -  Suicidal thoughts 2 2 -  PHQ-9 Score 14 17 -  Difficult doing work/chores Somewhat difficult Somewhat difficult -

## 2017-11-25 NOTE — Progress Notes (Signed)
Patient here today for Flu vaccine. VIS given. Administered in left arm. Tolerated well 

## 2017-11-25 NOTE — Progress Notes (Signed)
I have reviewed the patient's encounter and agree with the documentation.  Jeronda Don M. Elke Holtry, MD 11/25/2017 5:23 PM   

## 2018-03-28 ENCOUNTER — Other Ambulatory Visit: Payer: Self-pay | Admitting: Family Medicine

## 2018-04-04 ENCOUNTER — Other Ambulatory Visit: Payer: Self-pay | Admitting: Psychiatry

## 2018-04-06 ENCOUNTER — Other Ambulatory Visit: Payer: Self-pay | Admitting: Family Medicine

## 2018-05-04 ENCOUNTER — Other Ambulatory Visit: Payer: Self-pay | Admitting: Family Medicine

## 2018-05-17 ENCOUNTER — Telehealth: Payer: Self-pay | Admitting: Family Medicine

## 2018-05-17 NOTE — Telephone Encounter (Signed)
Pt left a vm stating that he has a pinched nerve in his neck and his back and is causing him pain. Per pt due to his health condition he is unable to get to urgent care. Pt states his psychiatrist called in a weekend worth of generic methocarbamol 750 mg which he states has helped. He wanted to know if another rx could be sent in for him.

## 2018-05-18 NOTE — Telephone Encounter (Signed)
F.Y.I   I spoke with pt and offered him a Webex to discuss further with Dr. Durene Cal.  Pt informed me that he goes on Medicaid in May 2020, as he has insurance with [facility] now.  Pt would have to pay out of pocket for a visit here, which he said he can not afford right now.  Pt said, he  will think about it and call the office back.  I informed pt that I could let Dr. Durene Cal know about this call.

## 2018-05-18 NOTE — Telephone Encounter (Signed)
See note

## 2018-05-18 NOTE — Telephone Encounter (Signed)
If his insurance is through [facility]- does [facility] have an option for televisit right now to help him? Who diagnosed him with pinched nerve- that person may be able to help as well. I think he needs an evaluation either with [place] or with them

## 2018-05-19 NOTE — Telephone Encounter (Signed)
Spoke with pt, he was originally evaluated at [facility] several years ago. I advised that he may want to check with [facility] or [facility] to see if they are offering virtual or telephone visits. We would also be happy to evaluate him. He will check with his insurance carrier and see who is in network.

## 2018-06-20 DIAGNOSIS — R7301 Impaired fasting glucose: Secondary | ICD-10-CM | POA: Diagnosis not present

## 2018-06-20 DIAGNOSIS — E7800 Pure hypercholesterolemia, unspecified: Secondary | ICD-10-CM | POA: Diagnosis not present

## 2018-06-20 DIAGNOSIS — I10 Essential (primary) hypertension: Secondary | ICD-10-CM | POA: Diagnosis not present

## 2018-06-27 ENCOUNTER — Emergency Department (HOSPITAL_COMMUNITY)
Admission: EM | Admit: 2018-06-27 | Discharge: 2018-06-27 | Disposition: A | Discharge status: Home / Self Care | Payer: Medicare Other | Attending: Emergency Medicine | Admitting: Emergency Medicine

## 2018-06-27 ENCOUNTER — Encounter (HOSPITAL_COMMUNITY): Payer: Self-pay

## 2018-06-27 ENCOUNTER — Other Ambulatory Visit: Payer: Self-pay

## 2018-06-27 DIAGNOSIS — J449 Chronic obstructive pulmonary disease, unspecified: Secondary | ICD-10-CM | POA: Insufficient documentation

## 2018-06-27 DIAGNOSIS — F1020 Alcohol dependence, uncomplicated: Secondary | ICD-10-CM | POA: Insufficient documentation

## 2018-06-27 DIAGNOSIS — Z87891 Personal history of nicotine dependence: Secondary | ICD-10-CM | POA: Insufficient documentation

## 2018-06-27 DIAGNOSIS — I10 Essential (primary) hypertension: Secondary | ICD-10-CM | POA: Insufficient documentation

## 2018-06-27 DIAGNOSIS — Z79899 Other long term (current) drug therapy: Secondary | ICD-10-CM | POA: Diagnosis not present

## 2018-06-27 NOTE — ED Notes (Signed)
Bed: [facility]3 Expected date:  Expected time:  Means of arrival:  Comments: 

## 2018-06-27 NOTE — ED Provider Notes (Signed)
[facility]-EMERGENCY DEPT Provider Note   CSN: [id number] Arrival date & time: 06/27/18  2120    History   Chief Complaint Chief Complaint  Patient presents with  . Medical Clearance    HPI Zachary FryJohn Boyd [name] is a 63 y.o. male.     63 year old male with prior medical history as detailed below presents for evaluation.  Patient reports a longstanding history of alcoholism.  Patient has been sober for at least the last several years.  Today he had a argument with his wife and family.  After becoming upset he decided to have 1 beer.  Subsequently, his family became more upset and suggested he come to the ER for evaluation.  Upon arrival to the ED he denies any sort of suicidal ideation or homicidal ideation.  He is clinically sober.  He is involved in a 12-step program and has a sponsor.  He attempted to contact his sponsor prior to arrival to the ED and was unable to get through to him.  Patient desires discharge.  He declines behavioral health evaluation.  The history is provided by the patient and medical records.  Illness  Location:  Argument with family, drank "one beer" Severity:  Mild Onset quality:  Sudden Duration:  1 day Timing:  Constant Progression:  Waxing and waning Chronicity:  New Associated symptoms: no chest pain and no fever     Past Medical History:  Diagnosis Date  . ADD (attention deficit disorder)   . Anxiety    Panic  . Bipolar disorder (HCC)   . GERD (gastroesophageal reflux disease)    ? they thought GERD caused cough.   . Mental disorder    ADD, bipolar  . Pneumonia 04/12/2013  . Shortness of breath   . Substance abuse [facility](HCC)     Patient Active Problem List   Diagnosis Date Noted  . Suicide attempt (HCC) 09/10/2017  . Severe recurrent major depression without psychotic features (HCC)   . Posttraumatic stress disorder   . Pneumonia 03/08/2017  . COPD with acute exacerbation (HCC) 01/19/2017  . OSA (obstructive sleep  apnea) 01/11/2017  . Rash 01/11/2017  . Hyponatremia 10/13/2016  . Hypertension 07/09/2016  . History of adenomatous polyp of colon 07/09/2016  . 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 Obesity, Class II, BMI 35-39.9 07/09/2016  . Edema 07/09/2016  . COPD (chronic obstructive pulmonary disease) (HCC) 02/27/2015  . Dyspnea on exertion 01/18/2015  . Lung mass 10/10/2013  . Chronic maxillary sinusitis 09/27/2013  . GERD (gastroesophageal reflux disease) 09/27/2013  . Chronic cough 06/21/2013  . Alcohol dependence (HCC) 07/29/2011  . Bipolar affective disorder, depressed, moderate (HCC) 07/29/2011    Class: Chronic    Past Surgical History:  Procedure Laterality Date  . APPENDECTOMY  1988  . COLONOSCOPY    . TONSILLECTOMY    . VIDEO BRONCHOSCOPY Bilateral 10/10/2013   Procedure: VIDEO BRONCHOSCOPY WITH FLUORO;  Surgeon: Oretha Milchakesh Alva V, MD;  [facility];  Service: Cardiopulmonary;  Laterality: Bilateral;  . VIDEO BRONCHOSCOPY N/A 10/13/2013   Procedure: VIDEO BRONCHOSCOPY, REMOVAL OF ENDOBRONCHIAL LESION, PROBABLE FOREIGN BODY;  Surgeon: Delight OvensEdward B Gerhardt, MD;  Location: [facility] OR;  Service: Thoracic;  Laterality: N/A;  . VIDEO BRONCHOSCOPY N/A 11/28/2013   Procedure: VIDEO BRONCHOSCOPY;  Surgeon: Delight OvensEdward B Gerhardt, MD;  [facility];  Service: Thoracic;  Laterality: N/A;  bronchoscopy for removal of foreign body  . VIDEO BRONCHOSCOPY Bilateral 03/10/2017   Procedure: VIDEO BRONCHOSCOPY WITH FLUORO;  Surgeon: Oretha MilchAlva, Rakesh V, MD;  Location: [facility] ENDOSCOPY;  Service: Cardiopulmonary;  Laterality: Bilateral;        Home Medications    Prior to Admission medications   Medication Sig Start Date End Date Taking? Authorizing Provider  albuterol (PROAIR HFA) 108 (90 Base) MCG/ACT inhaler Inhale 2 puffs into the lungs every 6 (six) hours as needed for wheezing or shortness of breath. 09/09/17   Armandina Stammer I, NP  albuterol (PROVENTIL) (2.5 MG/3ML) 0.083% nebulizer solution Take 3 mLs (2.5 mg  total) by nebulization every 6 (six) hours as needed for wheezing or shortness of breath. 09/09/17   Armandina Stammer I, NP  amLODipine (NORVASC) 5 MG tablet TAKE 1 TABLET ONCE DAILY. 05/04/18   Shelva Majestic, MD  fluticasone furoate-vilanterol (BREO ELLIPTA) 100-25 MCG/INH AEPB Inhale 1 puff into the lungs daily. For SOB 09/10/17   Nwoko, Nicole Kindred I, NP  gabapentin (NEURONTIN) 300 MG capsule Take 2 capsules (600 mg total) by mouth 3 (three) times daily. For agitation Patient taking differently: Take 800 mg by mouth 3 (three) times daily. For agitation 09/09/17   Armandina Stammer I, NP  losartan (COZAAR) 100 MG tablet TAKE 1 TABLET BY MOUTH DAILY. 03/29/18   Shelva Majestic, MD  Melatonin 5 MG TABS Take 1 tablet (5 mg total) by mouth at bedtime. For sleep 09/09/17   Armandina Stammer I, NP  umeclidinium bromide (INCRUSE ELLIPTA) 62.5 MCG/INH AEPB Inhale 1 puff into the lungs daily. For COPD 09/10/17   Sanjuana Kava, NP    Family History Family History  Problem Relation Age of Onset  . Breast cancer Mother   . Alcohol abuse Father        unclear cause of death. 34  . Colon cancer Father   . Alcohol abuse Sister        substance as well  . Alcohol abuse Brother        substance abuse  . Alcohol abuse Sister        substance abuse    Social History Social History   Tobacco Use  . Smoking status: Former Smoker    Packs/day: 1.00    Years: 20.00    Pack years: 20.00    Types: Cigarettes    Last attempt to quit: 02/16/1993    Years since quitting: 25.3  . Smokeless tobacco: Never Used  Substance Use Topics  . Alcohol use: No    Comment: none at all  . Drug use: No     Allergies   Omeprazole   Review of Systems Review of Systems  Constitutional: Negative for fever.  Cardiovascular: Negative for chest pain.  All other systems reviewed and are negative.    Physical Exam Updated Vital Signs BP (!) 205/108 (BP Location: Right Arm) Comment: Has not taken BP Rx today  Pulse 70   Temp  98.5 F (36.9 C) (Oral)   Resp 20   Ht 5\' 10"  (1.778 m)   Wt 113.4 kg   SpO2 95%   BMI 35.87 kg/m   Physical Exam Vitals signs and nursing note reviewed.  Constitutional:      General: He is not in acute distress.    Appearance: Normal appearance. He is well-developed.  HENT:     Head: Normocephalic and atraumatic.  Eyes:     Conjunctiva/sclera: Conjunctivae normal.     Pupils: Pupils are equal, round, and reactive to light.  Neck:     Musculoskeletal: Normal range of motion and neck supple.  Cardiovascular:  Rate and Rhythm: Normal rate and regular rhythm.     Heart sounds: Normal heart sounds.  Pulmonary:     Effort: Pulmonary effort is normal. No respiratory distress.     Breath sounds: Normal breath sounds.  Abdominal:     General: There is no distension.     Palpations: Abdomen is soft.     Tenderness: There is no abdominal tenderness.  Musculoskeletal: Normal range of motion.        General: No deformity.  Skin:    General: Skin is warm and dry.  Neurological:     Mental Status: He is alert and oriented to person, place, and time. Mental status is at baseline.  Psychiatric:        Mood and Affect: Mood normal.     Comments: Denies SI or HI      ED Treatments / Results  Labs (all labs ordered are listed, but only abnormal results are displayed) Labs Reviewed - No data to display  EKG None  Radiology No results found.  Procedures Procedures (including critical care time)  Medications Ordered in ED Medications - No data to display   Initial Impression / Assessment and Plan / ED Course  I have reviewed the triage vital signs and the nursing notes.  Pertinent labs & imaging results that were available during my care of the patient were reviewed by me and considered in my medical decision making (see chart for details).        MDM  Screen complete  Danie Diehl was evaluated in Emergency Department on 06/27/2018 for the symptoms  described in the history of present illness. He was evaluated in the context of the global COVID-19 pandemic, which necessitated consideration that the patient might be at risk for infection with the SARS-CoV-2 virus that causes COVID-19. Institutional protocols and algorithms that pertain to the evaluation of patients at risk for COVID-19 are in a state of rapid change based on information released by regulatory bodies including the CDC and federal and state organizations. These policies and algorithms were followed during the patient's care in the ED.  Patient is presenting after an argument with his wife.  He appears to have had 1 alcoholic drink prior to arrival.  This is concerning given his longstanding history of alcoholism.  However, he does not meet criteria for further inpatient medical or psychiatric treatment.  He is without suicidal ideation or homicidal ideation.  He furthermore, desires discharge.  He does understand the need for close follow-up both with his regular care provider and also with his 12-step sponsor.  Importance of close follow-up is stressed.  Strict precautions   Final Clinical Impressions(s) / ED Diagnoses   Final diagnoses:  Alcoholism [facility])    ED Discharge Orders    None       Wynetta Fines, MD 06/27/18 2209

## 2018-06-27 NOTE — Discharge Instructions (Addendum)
Please return for any problem.  Follow-up with your eye care provider as instructed.  As discussed, contact your sponsor tomorrow morning.

## 2018-06-27 NOTE — ED Triage Notes (Addendum)
States has been depressed for a couple of days drank a beer today and is a recovering alcoholic states no SI or HI. States family made him come to ER.  States has a lot of stress and anxiety.

## 2018-06-29 DIAGNOSIS — F1521 Other stimulant dependence, in remission: Secondary | ICD-10-CM | POA: Diagnosis not present

## 2018-06-29 DIAGNOSIS — F411 Generalized anxiety disorder: Secondary | ICD-10-CM | POA: Diagnosis not present

## 2018-06-29 DIAGNOSIS — F332 Major depressive disorder, recurrent severe without psychotic features: Secondary | ICD-10-CM | POA: Diagnosis not present

## 2018-06-29 DIAGNOSIS — F4312 Post-traumatic stress disorder, chronic: Secondary | ICD-10-CM | POA: Diagnosis not present

## 2018-07-04 DIAGNOSIS — F1021 Alcohol dependence, in remission: Secondary | ICD-10-CM | POA: Diagnosis not present

## 2018-07-04 DIAGNOSIS — I110 Hypertensive heart disease with heart failure: Secondary | ICD-10-CM | POA: Diagnosis not present

## 2018-07-04 DIAGNOSIS — J449 Chronic obstructive pulmonary disease, unspecified: Secondary | ICD-10-CM | POA: Diagnosis not present

## 2018-07-04 DIAGNOSIS — F1521 Other stimulant dependence, in remission: Secondary | ICD-10-CM | POA: Diagnosis not present

## 2018-07-04 DIAGNOSIS — I5033 Acute on chronic diastolic (congestive) heart failure: Secondary | ICD-10-CM | POA: Diagnosis not present

## 2018-07-04 DIAGNOSIS — F332 Major depressive disorder, recurrent severe without psychotic features: Secondary | ICD-10-CM | POA: Diagnosis not present

## 2018-07-04 DIAGNOSIS — R600 Localized edema: Secondary | ICD-10-CM | POA: Diagnosis not present

## 2018-07-07 ENCOUNTER — Other Ambulatory Visit: Payer: Self-pay

## 2018-07-07 ENCOUNTER — Encounter: Payer: Self-pay | Admitting: Cardiology

## 2018-07-07 ENCOUNTER — Ambulatory Visit: Payer: Medicare Other | Admitting: Cardiology

## 2018-07-07 VITALS — BP 151/92 | HR 62 | Temp 97.1°F | Ht 70.0 in | Wt 289.2 lb

## 2018-07-07 DIAGNOSIS — Z9989 Dependence on other enabling machines and devices: Secondary | ICD-10-CM

## 2018-07-07 DIAGNOSIS — R0609 Other forms of dyspnea: Secondary | ICD-10-CM | POA: Diagnosis not present

## 2018-07-07 DIAGNOSIS — R0600 Dyspnea, unspecified: Secondary | ICD-10-CM

## 2018-07-07 DIAGNOSIS — I5032 Chronic diastolic (congestive) heart failure: Secondary | ICD-10-CM | POA: Diagnosis not present

## 2018-07-07 DIAGNOSIS — I10 Essential (primary) hypertension: Secondary | ICD-10-CM

## 2018-07-07 DIAGNOSIS — R600 Localized edema: Secondary | ICD-10-CM

## 2018-07-07 DIAGNOSIS — F1021 Alcohol dependence, in remission: Secondary | ICD-10-CM

## 2018-07-07 DIAGNOSIS — F322 Major depressive disorder, single episode, severe without psychotic features: Secondary | ICD-10-CM

## 2018-07-07 DIAGNOSIS — G4733 Obstructive sleep apnea (adult) (pediatric): Secondary | ICD-10-CM

## 2018-07-07 MED ORDER — SPIRONOLACTONE 25 MG PO TABS: 25.0000 mg | ORAL_TABLET | Freq: Every morning | ORAL | 2 refills | Status: DC

## 2018-07-07 NOTE — Progress Notes (Signed)
Primary Physician/Referring:  Haywood Pao, MD  Patient ID: [name], male    DOB: October 28, 1955, 63 y.o.   MRN: [id number]  Chief Complaint  Patient presents with   Hypertension   New Patient (Initial Visit)   Leg Swelling    HPI: [name]  is a 63 y.o. male  with Major depression, long-term hypertension, history of alcohol abuse has been abstinent for many years now, recent admission to the hospital after he felt More depressed after quarrel with his wife and drank a bottle of beer and presented to the emergency room, was discharged home after routine exam.  Now referred to me for evaluation of worsening dyspnea on exertion, leg edema and weight gain.  Patient denies chest pain, states that he is unable to exercise and even doing activities of daily living is a major chore due to marked dyspnea.  He was started on furosemide for leg edema and has noticed some improvement in leg edema and also dyspnea.  He does have severe obstructive sleep apnea and has been compliant with CPAP.  He is referred by Dr. Osborne Casco for evaluation of the same.  He has COPD, states that he has been diagnosed with bronchiectasis and follows [facility], Has a 20-pack-year history of smoking and has been abstinent for many years.  Past Medical History:  Diagnosis Date   ADD (attention deficit disorder)    Anxiety    Panic   Bipolar disorder (HCC)    GERD (gastroesophageal reflux disease)    ? they thought GERD caused cough.    Mental disorder    ADD, bipolar   Pneumonia 04/12/2013   Shortness of breath    Substance abuse [facility])     Past Surgical History:  Procedure Laterality Date   APPENDECTOMY  1988   COLONOSCOPY     TONSILLECTOMY     VIDEO BRONCHOSCOPY Bilateral 10/10/2013   Procedure: VIDEO BRONCHOSCOPY WITH FLUORO;  Surgeon: Rigoberto Noel, MD;  Location: [place];  Service: Cardiopulmonary;  Laterality: Bilateral;   VIDEO BRONCHOSCOPY N/A  10/13/2013   Procedure: VIDEO BRONCHOSCOPY, REMOVAL OF ENDOBRONCHIAL LESION, PROBABLE FOREIGN BODY;  Surgeon: Grace Isaac, MD;  Location: [place];  Service: Thoracic;  Laterality: N/A;   VIDEO BRONCHOSCOPY N/A 11/28/2013   Procedure: VIDEO BRONCHOSCOPY;  Surgeon: Grace Isaac, MD;  Location: [place];  Service: Thoracic;  Laterality: N/A;  bronchoscopy for removal of foreign body   VIDEO BRONCHOSCOPY Bilateral 03/10/2017   Procedure: VIDEO BRONCHOSCOPY WITH FLUORO;  Surgeon: Rigoberto Noel, MD;  Location: [place];  Service: Cardiopulmonary;  Laterality: Bilateral;    Social History   Socioeconomic History   Marital status: Married    Spouse name: Not on file   Number of children: 2   Years of education: Not on file   Highest education level: Not on file  Occupational History   Not on file  Social Needs   Financial resource strain: Not on file   Food insecurity:    Worry: Not on file    Inability: Not on file   Transportation needs:    Medical: Not on file    Non-medical: Not on file  Tobacco Use   Smoking status: Former Smoker    Packs/day: 1.00    Years: 20.00    Pack years: 20.00    Types: Cigarettes    Last attempt to quit: 02/16/1993    Years since quitting: 25.4   Smokeless tobacco: Never Used  Substance and Sexual Activity   Alcohol use: No    Comment: none at all   Drug use: No   Sexual activity: Yes  Lifestyle   Physical activity:    Days per week: Not on file    Minutes per session: Not on file   Stress: Not on file  Relationships   Social connections:    Talks on phone: Not on file    Gets together: Not on file    Attends religious service: Not on file    Active member of club or organization: Not on file    Attends meetings of clubs or organizations: Not on file    Relationship status: Not on file   Intimate partner violence:    Fear of current or ex partner: Not on file    Emotionally abused: Not on file    Physically  abused: Not on file    Forced sexual activity: Not on file  Other Topics Concern   Not on file  Social History Narrative   Married. Lives with wife. 2 children. Soon to be grandfather 07/2016.       Freelance work/semi retired. Editing/writing.       Hobbies: enjoys writing poetry    Review of Systems  Constitution: Positive for malaise/fatigue and weight gain (30-40 lbs in 4-5 months). Negative for chills and decreased appetite.  Cardiovascular: Positive for dyspnea on exertion and leg swelling. Negative for syncope.  Respiratory: Positive for sleep disturbances due to breathing (on cpap) and snoring.   Endocrine: Negative for cold intolerance.  Hematologic/Lymphatic: Does not bruise/bleed easily.  Musculoskeletal: Positive for neck pain (spinal spur). Negative for back pain and joint swelling.  Gastrointestinal: Negative for abdominal pain, anorexia, change in bowel habit, hematochezia and melena.  Neurological: Positive for paresthesias (left hand due to C-spine spur). Negative for headaches and light-headedness.  Psychiatric/Behavioral: Negative for depression and substance abuse.  All other systems reviewed and are negative.     Objective  Blood pressure (!) 151/92, pulse 62, temperature (!) 97.1 F (36.2 C), height 5' 10"  (1.778 m), weight 289 lb 3.2 oz (131.2 kg), SpO2 97 %. Body mass index is 41.5 kg/m.    Physical Exam  Constitutional: He appears well-developed. No distress.  Morbidly obese  HENT:  Head: Atraumatic.  Eyes: Conjunctivae are normal.  Neck: Neck supple. No thyromegaly present.  Short neck and difficult to evaluate JVP  Cardiovascular: Normal rate, regular rhythm and normal heart sounds. Exam reveals no gallop.  No murmur heard. Pulses:      Carotid pulses are 2+ on the right side and 2+ on the left side.      Dorsalis pedis pulses are 2+ on the right side and 2+ on the left side.       Posterior tibial pulses are 2+ on the right side and 2+ on the  left side.  Femoral and popliteal pulse difficult to feel due to patient's body habitus.   Pulmonary/Chest: Effort normal and breath sounds normal.  Abdominal: Soft. Bowel sounds are normal.  Obese.   Musculoskeletal: Normal range of motion.        General: No edema.  Neurological: He is alert.  Skin: Skin is warm and dry.  Psychiatric: He has a normal mood and affect.   Radiology: No results found.  Laboratory examination:    Labs 07/04/2018: A1c 5.0%.  Serum glucose 120 mg, BUN 14, creatinine 1.0, eGFR greater than 60 mL, K 4.2, CMP otherwise normal.  HB 13.8/HCT  41.3, platelets 199.  Total cholesterol 126, triglycerides 206, HDL 42, LDL 43.  Non-HDL cholesterol 84.  CMP Latest Ref Rng & Units 09/21/2017 09/09/2017 09/02/2017  Glucose 70 - 99 mg/dL 98 110(H) 102(H)  BUN 6 - 23 mg/dL 11 13 15   Creatinine 0.40 - 1.50 mg/dL 0.90 0.83 0.93  Sodium 135 - 145 mEq/L 133(L) 131(L) 138  Potassium 3.5 - 5.1 mEq/L 4.4 4.0 4.3  Chloride 96 - 112 mEq/L 99 96(L) 102  CO2 19 - 32 mEq/L 28 25 26   Calcium 8.4 - 10.5 mg/dL 9.3 9.0 8.8(L)  Total Protein 6.5 - 8.1 g/dL - - 6.7  Total Bilirubin 0.3 - 1.2 mg/dL - - 0.9  Alkaline Phos 38 - 126 U/L - - 77  AST 15 - 41 U/L - - 23  ALT 0 - 44 U/L - - 26   CBC Latest Ref Rng & Units 09/02/2017 07/20/2017 03/11/2017  WBC 4.0 - 10.5 K/uL 8.0 9.2 9.8  Hemoglobin 13.0 - 17.0 g/dL 12.2(L) 14.3 10.5(L)  Hematocrit 39.0 - 52.0 % 36.4(L) 42.7 32.6(L)  Platelets 150 - 400 K/uL 255 319.0 250   Lipid Panel     Component Value Date/Time   CHOL 137 08/09/2015   TRIG 46 08/09/2015   HDL 44 08/09/2015   LDLCALC 84 08/09/2015   HEMOGLOBIN A1C Lab Results  Component Value Date   HGBA1C 5.3 07/09/2016   TSH Recent Labs    07/20/17 1523  TSH 1.09    PRN Meds:. Medications Discontinued During This Encounter  Medication Reason   amLODipine (NORVASC) 5 MG tablet Error   fluticasone furoate-vilanterol (BREO ELLIPTA) 100-25 MCG/INH AEPB Error    umeclidinium bromide (INCRUSE ELLIPTA) 62.5 MCG/INH AEPB Error   potassium chloride SA (K-DUR) 20 MEQ tablet Change in therapy   Current Meds  Medication Sig   albuterol (PROAIR HFA) 108 (90 Base) MCG/ACT inhaler Inhale 2 puffs into the lungs every 6 (six) hours as needed for wheezing or shortness of breath.   albuterol (PROVENTIL) (2.5 MG/3ML) 0.083% nebulizer solution Take 3 mLs (2.5 mg total) by nebulization every 6 (six) hours as needed for wheezing or shortness of breath.   atorvastatin (LIPITOR) 10 MG tablet Take 10 mg by mouth daily.   escitalopram (LEXAPRO) 10 MG tablet Take 10 mg by mouth daily.   Fluticasone-Umeclidin-Vilant (TRELEGY ELLIPTA) 100-62.5-25 MCG/INH AEPB Inhale into the lungs daily.   furosemide (LASIX) 40 MG tablet Take 40 mg by mouth daily.   gabapentin (NEURONTIN) 300 MG capsule Take 2 capsules (600 mg total) by mouth 3 (three) times daily. For agitation (Patient taking differently: Take 800 mg by mouth 3 (three) times daily. For agitation)   Iron Combinations (IRON COMPLEX PO) Take 65 mg by mouth daily.   losartan (COZAAR) 100 MG tablet TAKE 1 TABLET BY MOUTH DAILY.   Melatonin 5 MG TABS Take 1 tablet (5 mg total) by mouth at bedtime. For sleep   naproxen sodium (ALEVE) 220 MG tablet Take 220 mg by mouth daily as needed.   [DISCONTINUED] potassium chloride SA (K-DUR) 20 MEQ tablet Take 20 mEq by mouth daily.    Cardiac Studies:   ABI 11/11/2017: Normal resting bilateral ABI, no evidence of significant lower extremity arterial disease.  Echocardiogram 03/09/2017: Normal LV size, severe basal septal hypertrophy, normal LV systolic function, EF 85-46%.  No wall motion abnormality.  Grade 1 diastolic dysfunction. Mild left atrial enlargement.  Lower except he venous duplex 08/25/2011: No evidence of DVT.  Assessment  Chronic diastolic heart failure (HCC) - Plan: TSH, PCV ECHOCARDIOGRAM COMPLETE, PCV MYOCARDIAL PERFUSION WITH LEXISCAN, B Nat Peptide,  Basic Metabolic Panel (BMET), CANCELED: Basic Metabolic Panel (BMET)  Essential hypertension - Plan: EKG 12-Lead, spironolactone (ALDACTONE) 25 MG tablet  Dyspnea on exertion  Bilateral leg edema - Plan: spironolactone (ALDACTONE) 25 MG tablet  OSA on CPAP  Current severe episode of major depressive disorder without psychotic features, unspecified whether recurrent ([place])  Chronic alcoholism in remission ([place])  EKG 07/07/2018: Normal sinus rhythm at the rate of 59 bpm, normal axis.  No evidence of ischemia, normal EKG  Recommendations:    Patient referred to me for evaluation of worsening leg edema, dyspnea on exertion, uncontrolled hypertension.  His findings are most consistent with chronic diastolic heart failure.  It is related to morbid obesity, hypertension heart disease, deconditioning.  I have discussed with him regarding low salt diet, weight loss was discussed in detail. He is also drinking excessive amounts of fluid, his diet has been poor with excess salt. He has gained about 30-40 pounds in weight with a past 5-6 months, I'll obtain a TSH today.  I'll obtain a BMP as well.  I'll start the patient on spironolactone 25 mg every morning, we'll repeat BMP in one week to 10 days. We should help with both hypertension and leg edema. I have reviewed his Karren Cobble performed echocardiogram, lower extremity ABI and venous duplex.  His labs were reviewed and updated.  Schedule for a Lexiscan Sestamibi stress test to evaluate for myocardial ischemia. Patient unable to do treadmill stress testing due to dyspnea. Will schedule for an echocardiogram. Office visit following the work-up/investigations in 4-6 weeks unless symptoms worsening, he will call to be seen sooner.   Adrian Prows, MD, [facility] 07/07/2018, 11:23 AM [facility]. [place] Pager: [phone] Office: [phone] If no answer Cell [phone]

## 2018-07-07 NOTE — Addendum Note (Signed)
Addended by: Delrae Rend on: 07/07/2018 11:25 AM   Modules accepted: Orders

## 2018-07-08 ENCOUNTER — Other Ambulatory Visit: Payer: Self-pay

## 2018-07-08 LAB — BASIC METABOLIC PANEL
BUN/Creatinine Ratio: 14 (ref 10–24)
BUN: 15 mg/dL (ref 8–27)
CO2: 26 mmol/L (ref 20–29)
Calcium: 9.4 mg/dL (ref 8.6–10.2)
Chloride: 101 mmol/L (ref 96–106)
Creatinine, Ser: 1.09 mg/dL (ref 0.76–1.27)
GFR calc Af Amer: 82 mL/min/{1.73_m2} (ref >59)
GFR calc non Af Amer: 71 mL/min/{1.73_m2} (ref >59)
Glucose: 91 mg/dL (ref 65–99)
Potassium: 5 mmol/L (ref 3.5–5.2)
Sodium: 141 mmol/L (ref 134–144)

## 2018-07-08 LAB — BRAIN NATRIURETIC PEPTIDE: BNP: 5.5 pg/mL (ref 0.0–100.0)

## 2018-07-12 ENCOUNTER — Telehealth: Payer: Self-pay

## 2018-07-12 NOTE — Telephone Encounter (Signed)
Pt called regarding weight increased 7lb then decreased next day 5lbs, pt stated that he is taking medication prescribed.

## 2018-07-13 ENCOUNTER — Telehealth: Payer: Self-pay

## 2018-07-13 DIAGNOSIS — F1521 Other stimulant dependence, in remission: Secondary | ICD-10-CM | POA: Diagnosis not present

## 2018-07-13 DIAGNOSIS — F1021 Alcohol dependence, in remission: Secondary | ICD-10-CM | POA: Diagnosis not present

## 2018-07-13 DIAGNOSIS — F332 Major depressive disorder, recurrent severe without psychotic features: Secondary | ICD-10-CM | POA: Diagnosis not present

## 2018-07-13 NOTE — Telephone Encounter (Signed)
Could you please send for s/l NTG and advise how to use. To take it easy until stress and echo  done

## 2018-07-13 NOTE — Telephone Encounter (Signed)
Pt called and he said he is having chest pain when he walks up the hill by his house.

## 2018-07-15 MED ORDER — NITROGLYCERIN 0.4 MG SL SUBL: 0.4000 mg | SUBLINGUAL_TABLET | SUBLINGUAL | 3 refills | Status: DC | PRN

## 2018-07-18 ENCOUNTER — Other Ambulatory Visit: Payer: Self-pay

## 2018-07-18 DIAGNOSIS — I5033 Acute on chronic diastolic (congestive) heart failure: Secondary | ICD-10-CM | POA: Diagnosis not present

## 2018-07-18 DIAGNOSIS — R5383 Other fatigue: Secondary | ICD-10-CM | POA: Diagnosis not present

## 2018-07-18 DIAGNOSIS — I110 Hypertensive heart disease with heart failure: Secondary | ICD-10-CM | POA: Diagnosis not present

## 2018-07-18 DIAGNOSIS — I5032 Chronic diastolic (congestive) heart failure: Secondary | ICD-10-CM | POA: Diagnosis not present

## 2018-07-18 DIAGNOSIS — R600 Localized edema: Secondary | ICD-10-CM | POA: Diagnosis not present

## 2018-07-19 LAB — BASIC METABOLIC PANEL
BUN/Creatinine Ratio: 19 (ref 10–24)
BUN: 21 mg/dL (ref 8–27)
CO2: 23 mmol/L (ref 20–29)
Calcium: 9.9 mg/dL (ref 8.6–10.2)
Chloride: 100 mmol/L (ref 96–106)
Creatinine, Ser: 1.11 mg/dL (ref 0.76–1.27)
GFR calc Af Amer: 80 mL/min/{1.73_m2} (ref >59)
GFR calc non Af Amer: 69 mL/min/{1.73_m2} (ref >59)
Glucose: 118 mg/dL — ABNORMAL HIGH (ref 65–99)
Potassium: 4.7 mmol/L (ref 3.5–5.2)
Sodium: 140 mmol/L (ref 134–144)

## 2018-07-19 LAB — TSH: TSH: 1.4 u[IU]/mL (ref 0.450–4.500)

## 2018-07-20 ENCOUNTER — Telehealth: Payer: Self-pay

## 2018-07-20 ENCOUNTER — Other Ambulatory Visit: Payer: Self-pay

## 2018-07-20 NOTE — Telephone Encounter (Signed)
If no symptoims, nothing to worry

## 2018-07-20 NOTE — Telephone Encounter (Signed)
Pt called and said his BP is 95/54

## 2018-07-21 NOTE — Telephone Encounter (Signed)
He said he is going to monitor and see how he does

## 2018-07-26 ENCOUNTER — Other Ambulatory Visit: Payer: Self-pay

## 2018-07-26 ENCOUNTER — Telehealth: Payer: Self-pay

## 2018-07-26 MED ORDER — NITROGLYCERIN 0.4 MG SL SUBL: 0.4000 mg | SUBLINGUAL_TABLET | SUBLINGUAL | 3 refills | Status: AC | PRN

## 2018-07-26 NOTE — Telephone Encounter (Signed)
Is his edema better, if so, can take as needed. Otherwise continue as directed. If not doing well, want to see him sooner

## 2018-07-26 NOTE — Telephone Encounter (Signed)
Pt called regarding a medication his PC prescribed for him ( furosemide 40mg ) wanted to know if you wanted him to continue taking this medication? Please advise

## 2018-07-28 NOTE — Telephone Encounter (Signed)
Spoke to patient will continue take to medication daily until edema has subsided.

## 2018-08-04 ENCOUNTER — Ambulatory Visit: Payer: Medicare Other | Admitting: Cardiology

## 2018-08-05 DIAGNOSIS — J410 Simple chronic bronchitis: Secondary | ICD-10-CM | POA: Diagnosis not present

## 2018-08-05 DIAGNOSIS — J479 Bronchiectasis, uncomplicated: Secondary | ICD-10-CM | POA: Diagnosis not present

## 2018-08-05 DIAGNOSIS — R1310 Dysphagia, unspecified: Secondary | ICD-10-CM | POA: Diagnosis not present

## 2018-08-05 DIAGNOSIS — R0600 Dyspnea, unspecified: Secondary | ICD-10-CM | POA: Diagnosis not present

## 2018-08-05 DIAGNOSIS — J470 Bronchiectasis with acute lower respiratory infection: Secondary | ICD-10-CM | POA: Diagnosis not present

## 2018-08-08 ENCOUNTER — Ambulatory Visit (INDEPENDENT_AMBULATORY_CARE_PROVIDER_SITE_OTHER): Payer: Medicare Other

## 2018-08-08 ENCOUNTER — Other Ambulatory Visit: Payer: Self-pay

## 2018-08-08 ENCOUNTER — Other Ambulatory Visit: Payer: Medicare Other

## 2018-08-08 DIAGNOSIS — I5032 Chronic diastolic (congestive) heart failure: Secondary | ICD-10-CM | POA: Diagnosis not present

## 2018-08-09 DIAGNOSIS — I5032 Chronic diastolic (congestive) heart failure: Secondary | ICD-10-CM | POA: Diagnosis not present

## 2018-08-09 DIAGNOSIS — M25552 Pain in left hip: Secondary | ICD-10-CM | POA: Diagnosis not present

## 2018-08-09 DIAGNOSIS — I110 Hypertensive heart disease with heart failure: Secondary | ICD-10-CM | POA: Diagnosis not present

## 2018-08-09 DIAGNOSIS — M5416 Radiculopathy, lumbar region: Secondary | ICD-10-CM | POA: Diagnosis not present

## 2018-08-12 ENCOUNTER — Telehealth: Payer: Self-pay

## 2018-08-12 DIAGNOSIS — F418 Other specified anxiety disorders: Secondary | ICD-10-CM | POA: Diagnosis not present

## 2018-08-12 DIAGNOSIS — M5416 Radiculopathy, lumbar region: Secondary | ICD-10-CM | POA: Diagnosis not present

## 2018-08-12 DIAGNOSIS — M5412 Radiculopathy, cervical region: Secondary | ICD-10-CM | POA: Diagnosis not present

## 2018-08-12 DIAGNOSIS — M542 Cervicalgia: Secondary | ICD-10-CM | POA: Diagnosis not present

## 2018-08-12 NOTE — Telephone Encounter (Signed)
Took care of it

## 2018-08-12 NOTE — Telephone Encounter (Signed)
Patient had left without nursing staff regarding acute dyspnea, I called the patient today and explained to him that his cardiac stress test was normal, I also reviewed his records from [facility], he recently saw his pulmonologist.  He does have underlying COPD and also bronchiectasis.  His BNP both at [facility] and which we had done here in the past also were within normal limits.  So I am beginning to suspect his dyspnea is related to pulmonary issues than cardiac issues.  Chest x-ray also reveals dilated pulmonary arteries suggestive of chronic pulmonary hypertension probably related to his underlying COPD and obesity.  He has an echo pending next week, will continue to follow with this and update him.  Patient felt reassured.

## 2018-08-12 NOTE — Telephone Encounter (Signed)
Pt called stating he took a walk this morning and he could hardly breathe, had to stop every 5 steps; He says you told him his stress test was normal; Please advise

## 2018-08-15 DIAGNOSIS — R0600 Dyspnea, unspecified: Secondary | ICD-10-CM | POA: Diagnosis not present

## 2018-08-15 DIAGNOSIS — J479 Bronchiectasis, uncomplicated: Secondary | ICD-10-CM | POA: Diagnosis not present

## 2018-08-15 DIAGNOSIS — R9389 Abnormal findings on diagnostic imaging of other specified body structures: Secondary | ICD-10-CM | POA: Diagnosis not present

## 2018-08-16 ENCOUNTER — Other Ambulatory Visit: Payer: Self-pay

## 2018-08-16 ENCOUNTER — Ambulatory Visit (INDEPENDENT_AMBULATORY_CARE_PROVIDER_SITE_OTHER): Payer: Medicare Other

## 2018-08-16 DIAGNOSIS — I5032 Chronic diastolic (congestive) heart failure: Secondary | ICD-10-CM | POA: Diagnosis not present

## 2018-08-22 ENCOUNTER — Ambulatory Visit: Payer: Medicare Other | Admitting: Cardiology

## 2018-08-22 ENCOUNTER — Other Ambulatory Visit: Payer: Self-pay

## 2018-08-22 ENCOUNTER — Encounter: Payer: Self-pay | Admitting: Cardiology

## 2018-08-22 VITALS — BP 116/87 | HR 76 | Ht 70.0 in | Wt 287.0 lb

## 2018-08-22 DIAGNOSIS — R0609 Other forms of dyspnea: Secondary | ICD-10-CM | POA: Diagnosis not present

## 2018-08-22 DIAGNOSIS — I5032 Chronic diastolic (congestive) heart failure: Secondary | ICD-10-CM | POA: Diagnosis not present

## 2018-08-22 DIAGNOSIS — I10 Essential (primary) hypertension: Secondary | ICD-10-CM

## 2018-08-22 DIAGNOSIS — R600 Localized edema: Secondary | ICD-10-CM | POA: Diagnosis not present

## 2018-08-22 DIAGNOSIS — R0600 Dyspnea, unspecified: Secondary | ICD-10-CM | POA: Diagnosis not present

## 2018-08-22 MED ORDER — SPIRONOLACTONE 25 MG PO TABS: 25.0000 mg | ORAL_TABLET | Freq: Every morning | ORAL | 1 refills | Status: AC

## 2018-08-22 NOTE — Progress Notes (Signed)
Virtual Visit via Video Note: This visit type was conducted due to national recommendations for restrictions regarding the COVID-19 Pandemic (e.g. social distancing).  This format is felt to be most appropriate for this patient at this time.  All issues noted in this document were discussed and addressed.  No physical exam was performed (except for noted visual exam findings with Telehealth visits).  The patient has consented to conduct a Telehealth visit and understands insurance will be billed.   I connected with@, on 08/22/18 at  by a video enabled telemedicine application and verified that I am speaking with the correct person using two identifiers.   I discussed the limitations of evaluation and management by telemedicine and the availability of in person appointments. The patient expressed understanding and agreed to proceed.   I have discussed with patient regarding the safety during COVID Pandemic and steps and precautions to be taken including social distancing, frequent hand wash and use of detergent soap, gels with the patient. I asked the patient to avoid touching mouth, nose, eyes, ears with the hands. I encouraged regular walking around the neighborhood and exercise and regular diet, as long as social distancing can be maintained.  Primary Physician/Referring:  Haywood Pao, MD  Patient ID: [name], male    DOB: 1955/07/01, 63 y.o.   MRN: [id number]  Chief Complaint  Patient presents with   Congestive Heart Failure   Results    echo, lexi, labs   Follow-up    4-6wk    HPI: [name]  is a 63 y.o. male  with Major depression, long-term hypertension, history of alcohol abuse has been abstinent for many years now, recent admission to the hospital after he felt More depressed after quarrel with his wife and drank a bottle of beer and presented to the emergency room, was discharged home after routine exam.    I had seen him for evaluation of worsening dyspnea  on exertion, leg edema and weight gain about 6 weeks ago.  He has COPD, states that he has been diagnosed with bronchiectasis and follows [facility], Has a 20-pack-year history of smoking and has been abstinent for many years. Continues to have leg edema in spite of lasix, states since being on lasix and aldactone, BP is now well controlled.   Past Medical History:  Diagnosis Date   ADD (attention deficit disorder)    Anxiety    Panic   Bipolar disorder (HCC)    GERD (gastroesophageal reflux disease)    ? they thought GERD caused cough.    Mental disorder    ADD, bipolar   Pneumonia 04/12/2013   Shortness of breath    Substance abuse [facility])     Past Surgical History:  Procedure Laterality Date   APPENDECTOMY  1988   COLONOSCOPY     TONSILLECTOMY     VIDEO BRONCHOSCOPY Bilateral 10/10/2013   Procedure: VIDEO BRONCHOSCOPY WITH FLUORO;  Surgeon: Rigoberto Noel, MD;  Location: [place];  Service: Cardiopulmonary;  Laterality: Bilateral;   VIDEO BRONCHOSCOPY N/A 10/13/2013   Procedure: VIDEO BRONCHOSCOPY, REMOVAL OF ENDOBRONCHIAL LESION, PROBABLE FOREIGN BODY;  Surgeon: Grace Isaac, MD;  Location: [place];  Service: Thoracic;  Laterality: N/A;   VIDEO BRONCHOSCOPY N/A 11/28/2013   Procedure: VIDEO BRONCHOSCOPY;  Surgeon: Grace Isaac, MD;  Location: [place];  Service: Thoracic;  Laterality: N/A;  bronchoscopy for removal of foreign body   VIDEO BRONCHOSCOPY Bilateral 03/10/2017   Procedure: VIDEO BRONCHOSCOPY WITH  FLUORO;  Surgeon: Rigoberto Noel, MD;  Location: [place];  Service: Cardiopulmonary;  Laterality: Bilateral;    Social History   Socioeconomic History   Marital status: Married    Spouse name: Not on file   Number of children: 2   Years of education: Not on file   Highest education level: Not on file  Occupational History   Not on file  Social Needs   Financial resource strain: Not on file   Food insecurity     Worry: Not on file    Inability: Not on file   Transportation needs    Medical: Not on file    Non-medical: Not on file  Tobacco Use   Smoking status: Former Smoker    Packs/day: 1.00    Years: 20.00    Pack years: 20.00    Types: Cigarettes    Quit date: 02/16/1993    Years since quitting: 25.5   Smokeless tobacco: Never Used  Substance and Sexual Activity   Alcohol use: No    Comment: none at all   Drug use: No   Sexual activity: Yes  Lifestyle   Physical activity    Days per week: Not on file    Minutes per session: Not on file   Stress: Not on file  Relationships   Social connections    Talks on phone: Not on file    Gets together: Not on file    Attends religious service: Not on file    Active member of club or organization: Not on file    Attends meetings of clubs or organizations: Not on file    Relationship status: Not on file   Intimate partner violence    Fear of current or ex partner: Not on file    Emotionally abused: Not on file    Physically abused: Not on file    Forced sexual activity: Not on file  Other Topics Concern   Not on file  Social History Narrative   Married. Lives with wife. 2 children. Soon to be grandfather 07/2016.       Freelance work/semi retired. Editing/writing.       Hobbies: enjoys writing poetry    Review of Systems  Constitution: Positive for malaise/fatigue and weight gain (30-40 lbs in 4-5 months). Negative for chills and decreased appetite.  Cardiovascular: Positive for dyspnea on exertion and leg swelling. Negative for syncope.  Respiratory: Positive for sleep disturbances due to breathing (on cpap) and snoring.   Endocrine: Negative for cold intolerance.  Hematologic/Lymphatic: Does not bruise/bleed easily.  Musculoskeletal: Positive for neck pain (spinal spur). Negative for back pain and joint swelling.  Gastrointestinal: Negative for abdominal pain, anorexia, change in bowel habit, hematochezia and melena.    Neurological: Positive for paresthesias (left hand due to C-spine spur). Negative for headaches and light-headedness.  Psychiatric/Behavioral: Negative for depression and substance abuse.  All other systems reviewed and are negative.     Objective  Blood pressure 116/87, pulse 76, height 5' 10"  (1.778 m), weight 287 lb (130.2 kg). Body mass index is 41.18 kg/m.   Physical exam not performed or limited due to virtual visit.  Patient appeared to be in no distress, respiration was not labored.  Please see exam details from prior visit is as below.  Physical Exam  Constitutional: He appears well-developed. No distress.  Morbidly obese  HENT:  Head: Atraumatic.  Eyes: Conjunctivae are normal.  Neck: Neck supple. No thyromegaly present.  Short neck and difficult to  evaluate JVP  Cardiovascular: Normal rate, regular rhythm and normal heart sounds. Exam reveals no gallop.  No murmur heard. Pulses:      Carotid pulses are 2+ on the right side and 2+ on the left side.      Dorsalis pedis pulses are 2+ on the right side and 2+ on the left side.       Posterior tibial pulses are 2+ on the right side and 2+ on the left side.  Femoral and popliteal pulse difficult to feel due to patient's body habitus.   Pulmonary/Chest: Effort normal and breath sounds normal.  Abdominal: Soft. Bowel sounds are normal.  Obese.   Musculoskeletal: Normal range of motion.        General: No edema.  Neurological: He is alert.  Skin: Skin is warm and dry.  Psychiatric: He has a normal mood and affect.   Radiology: No results found.  Laboratory examination:    Labs 07/04/2018: A1c 5.0%.  Serum glucose 120 mg, BUN 14, creatinine 1.0, eGFR greater than 60 mL, K 4.2, CMP otherwise normal.  HB 13.8/HCT 41.3, platelets 199.  Total cholesterol 126, triglycerides 206, HDL 42, LDL 43.  Non-HDL cholesterol 84.  CMP Latest Ref Rng & Units 07/18/2018 07/07/2018 09/21/2017  Glucose 65 - 99 mg/dL 118(H) 91 98  BUN 8 - 27  mg/dL 21 15 11   Creatinine 0.76 - 1.27 mg/dL 1.11 1.09 0.90  Sodium 134 - 144 mmol/L 140 141 133(L)  Potassium 3.5 - 5.2 mmol/L 4.7 5.0 4.4  Chloride 96 - 106 mmol/L 100 101 99  CO2 20 - 29 mmol/L 23 26 28   Calcium 8.6 - 10.2 mg/dL 9.9 9.4 9.3  Total Protein 6.5 - 8.1 g/dL - - -  Total Bilirubin 0.3 - 1.2 mg/dL - - -  Alkaline Phos 38 - 126 U/L - - -  AST 15 - 41 U/L - - -  ALT 0 - 44 U/L - - -   CBC Latest Ref Rng & Units 09/02/2017 07/20/2017 03/11/2017  WBC 4.0 - 10.5 K/uL 8.0 9.2 9.8  Hemoglobin 13.0 - 17.0 g/dL 12.2(L) 14.3 10.5(L)  Hematocrit 39.0 - 52.0 % 36.4(L) 42.7 32.6(L)  Platelets 150 - 400 K/uL 255 319.0 250   Lipid Panel     Component Value Date/Time   CHOL 137 08/09/2015   TRIG 46 08/09/2015   HDL 44 08/09/2015   LDLCALC 84 08/09/2015   HEMOGLOBIN A1C Lab Results  Component Value Date   HGBA1C 5.3 07/09/2016   TSH Recent Labs    07/18/18 1106  TSH 1.400    PRN Meds:. Medications Discontinued During This Encounter  Medication Reason   spironolactone (ALDACTONE) 25 MG tablet Reorder   Current Meds  Medication Sig   albuterol (PROAIR HFA) 108 (90 Base) MCG/ACT inhaler Inhale 2 puffs into the lungs every 6 (six) hours as needed for wheezing or shortness of breath.   albuterol (PROVENTIL) (2.5 MG/3ML) 0.083% nebulizer solution Take 3 mLs (2.5 mg total) by nebulization every 6 (six) hours as needed for wheezing or shortness of breath.   atorvastatin (LIPITOR) 10 MG tablet Take 10 mg by mouth daily.   escitalopram (LEXAPRO) 10 MG tablet Take 10 mg by mouth daily.   Fluticasone-Umeclidin-Vilant (TRELEGY ELLIPTA) 100-62.5-25 MCG/INH AEPB Inhale into the lungs daily.   furosemide (LASIX) 40 MG tablet Take 40 mg by mouth daily.   gabapentin (NEURONTIN) 300 MG capsule Take 2 capsules (600 mg total) by mouth 3 (three) times daily. For agitation (  Patient taking differently: Take 800 mg by mouth 3 (three) times daily. For agitation)   hydrOXYzine (VISTARIL)  25 MG capsule Take 1 capsule by mouth daily.   Iron Combinations (IRON COMPLEX PO) Take 65 mg by mouth daily.   losartan (COZAAR) 100 MG tablet TAKE 1 TABLET BY MOUTH DAILY.   Melatonin 5 MG TABS Take 1 tablet (5 mg total) by mouth at bedtime. For sleep   naproxen sodium (ALEVE) 220 MG tablet Take 220 mg by mouth daily as needed.   nitroGLYCERIN (NITROSTAT) 0.4 MG SL tablet Place 1 tablet (0.4 mg total) under the tongue every 5 (five) minutes as needed for chest pain.   spironolactone (ALDACTONE) 25 MG tablet Take 1 tablet (25 mg total) by mouth every morning.   traMADol (ULTRAM) 50 MG tablet 2 (two) times a day.   traZODone (DESYREL) 100 MG tablet Take 2 tablets by mouth daily.   [DISCONTINUED] spironolactone (ALDACTONE) 25 MG tablet Take 1 tablet (25 mg total) by mouth every morning.    Cardiac Studies:   ABI 11/11/2017: Normal resting bilateral ABI, no evidence of significant lower extremity arterial disease.  Echocardiogram 03/09/2017: Normal LV size, severe basal septal hypertrophy, normal LV systolic function, EF 41-28%.  No wall motion abnormality.  Grade 1 diastolic dysfunction. Mild left atrial enlargement.  Venous duplex lower extremity 08/25/2011: No evidence of DVT.  [facility] Stress Test 08/08/2018: Stress EKG is non-diagnostic, as this is pharmacological stress test. The perfusion imaging study demonstrates normal perfusion with no evidence of ischemia or scar.  LVEF was calculated at 62% with no regional wall motion abnormality. There is slight increased activity noted in the retrosternal area and may represent a hiatal hernia, however abnormal uptake may need further workup if clinically indicated and patient is at high risk for malignancy. Low risk cardiac stress study.  Echocardiogram 08/16/2018: Normal LV systolic function with EF 57%. Left ventricle cavity is normal in size. Mild concentric hypertrophy of the left ventricle. Normal global wall motion.  Doppler evidence of grade II (pseudonormal) diastolic dysfunction, elevated LAP. Calculated EF 57%. Left atrial cavity is severely dilated. LA vol index 54 cc/m2. Right atrial cavity is mildly dilated. Mild (Grade I) mitral regurgitation. Mild tricuspid regurgitation. Estimated pulmonary artery systolic pressure is 30 mmHg.  CTA chest 08/15/2018 at [facility]: No significant change in appearance of focal bronchiectasis, consolidation, and volume loss in the right lower lobe, compared to 2016. Findings may represent sequela of prior infection.  In the absence of clinical concern, no further radiographic follow up is needed.   Assessment   1. Dyspnea on exertion   2. Chronic diastolic heart failure ([place])   3. Essential hypertension   4. Bilateral leg edema   5. Morbid obesity ([place])    EKG 07/07/2018: Normal sinus rhythm at the rate of 59 bpm, normal axis.  No evidence of ischemia, normal EKG  Recommendations:    Patient referred to me for evaluation of worsening leg edema, dyspnea on exertion, uncontrolled hypertension.  Since being on spironolactone and furosemide, blood pressure is not very well controlled.  I suspect his dyspnea is related to multifactorial etiology including although BNP is normal, echocardiogram does reveal grade 2 diastolic dysfunction.  Obesity hypoventilation and obstructive sleep apnea is also contributing.  His BMI is greater than 40. His nuclear stress test that revealed increased mediastinal uptake of radioisotope, CT scan of the chest was performed at [facility] revealing no evidence of malignancy.  I have reviewed all the  results of the tests with the patient, we discussed extensively regarding weight loss, he can continue with present medications, advised him to use support stockings for his leg edema and I suspect the leg edema is related to obesity.  If edema persist in spite of use of support stockings which is advised to wear them within 30 minutes of waking  up, could consider venous insufficiency study for varicose veins.  Otherwise from cardiac standpoint no other specific recommendations, he will follow-up with Dr. Domenick Gong for further management.  His findings are most consistent with chronic diastolic heart failure.  It is related to morbid obesity, hypertension heart disease, deconditioning.  I have discussed with him regarding low salt diet, weight loss was discussed in detail. He is also drinking excessive amounts of fluid, his diet has been poor with excess salt. He has gained about 30-40 pounds in weight with a past 5-6 months, I'll obtain a TSH today.  I'll obtain a BMP as well.  I'll start the patient on spironolactone 25 mg every morning, we'll repeat BMP in one week to 10 days. We should help with both hypertension and leg edema. I have reviewed his Karren Cobble performed echocardiogram, lower extremity ABI and venous duplex.  His labs were reviewed and updated.  Schedule for a Lexiscan Sestamibi stress test to evaluate for myocardial ischemia. Patient unable to do treadmill stress testing due to dyspnea. Will schedule for an echocardiogram. Office visit following the work-up/investigations in 4-6 weeks unless symptoms worsening, he will call to be seen sooner.   Adrian Prows, MD, [facility] 08/22/2018, 12:16 PM [facility] [place] Pager: [phone] Office: [phone] If no answer Cell [phone]

## 2018-08-23 DIAGNOSIS — M545 Low back pain: Secondary | ICD-10-CM | POA: Diagnosis not present

## 2018-08-23 DIAGNOSIS — M542 Cervicalgia: Secondary | ICD-10-CM | POA: Diagnosis not present

## 2018-08-29 IMAGING — RF DG ESOPHAGUS
8 of 9 series · 18 of 24 positions shown · non-contrast
Comparison: CT chest dated March 07, 2017.

CLINICAL DATA: Coughing, recent aspiration pneumonia. Normal
swallow study yesterday.

EXAM:
ESOPHOGRAM / BARIUM SWALLOW / BARIUM TABLET STUDY
TECHNIQUE: Combined double contrast and single contrast examination performed
using effervescent crystals, thick barium liquid, and thin barium
liquid. The patient was observed with fluoroscopy swallowing a 13 mm
barium sulphate tablet.
FLUOROSCOPY TIME:  Fluoroscopy Time:  1 minute, 48 seconds.
Radiation Exposure Index (if provided by the fluoroscopic device):
32.0 mGy.
Number of Acquired Spot Images: 0

[Series 13: cp_standard · 0.51mm/px · 2 of 51 frames shown (1 of 8)]
[frame 3/51]
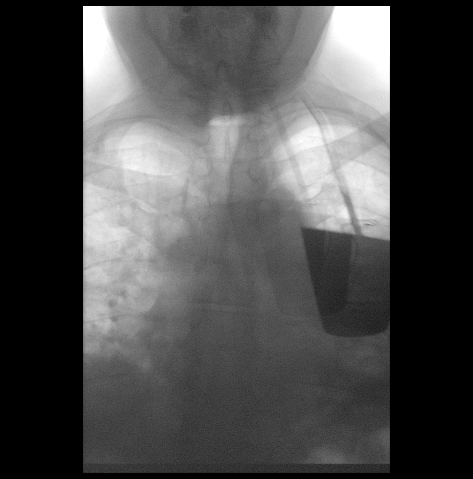
[frame 44/51]
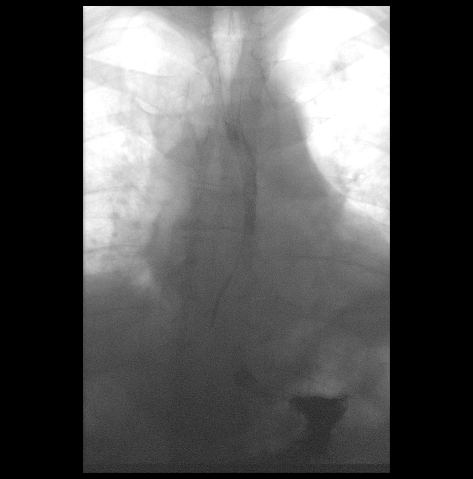

[Series 14: cp_standard · 0.51mm/px · 2 of 64 frames shown (2 of 8)]
[frame 7/64]
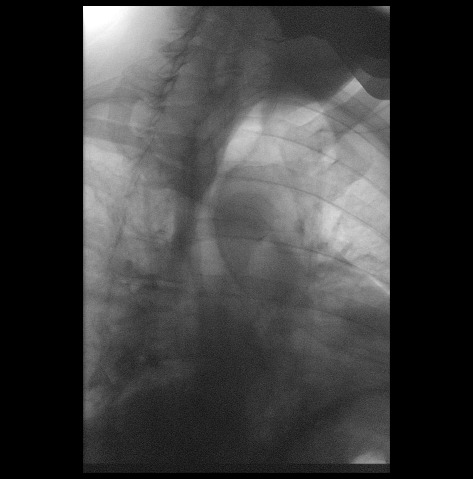
[frame 33/64]
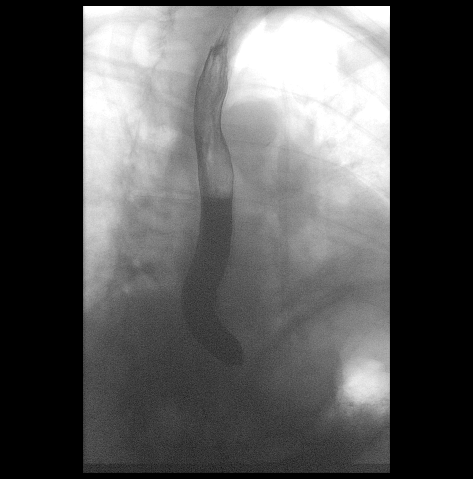

[Series 15: cp_standard · 0.34mm/px · 3 of 48 frames shown (3 of 8)]
[frame 8/48]
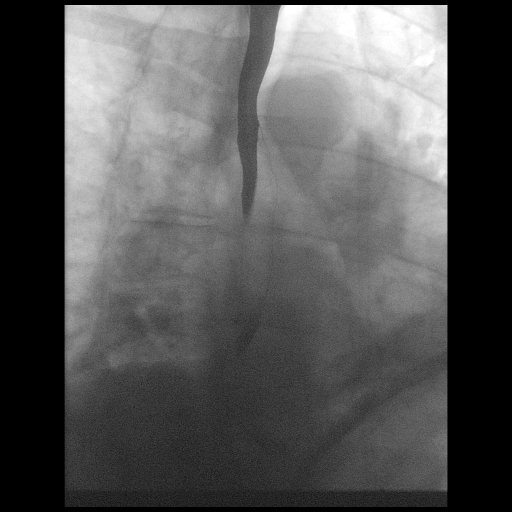
[frame 25/48]
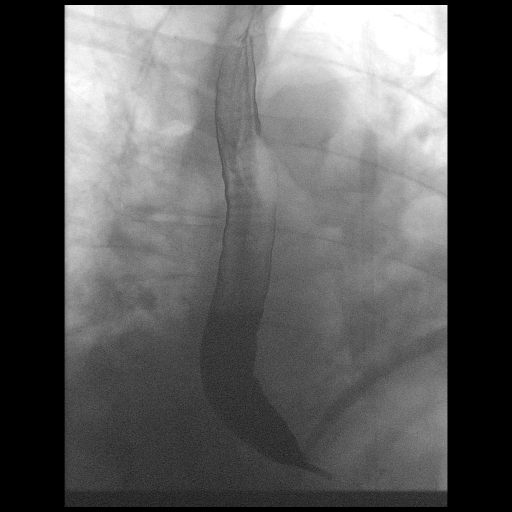
[frame 41/48]
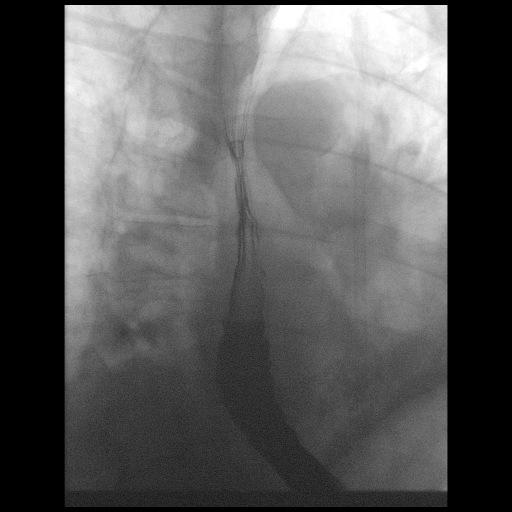

[Series 18: cp_standard · 0.34mm/px · 3 of 59 frames shown (4 of 8)]
[frame 2/59]
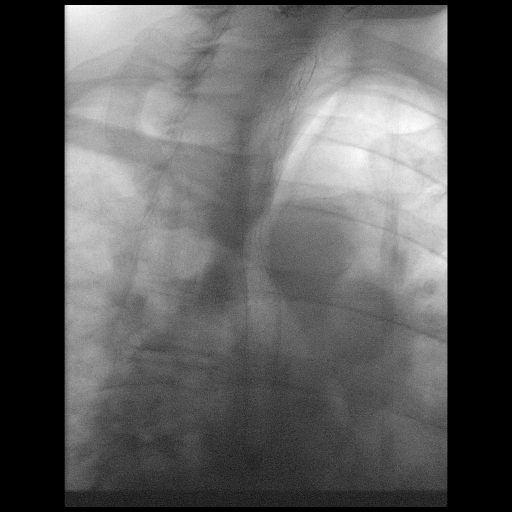
[frame 30/59]
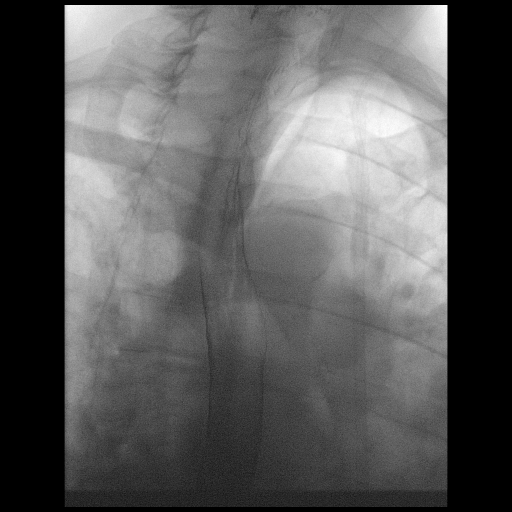
[frame 51/59]
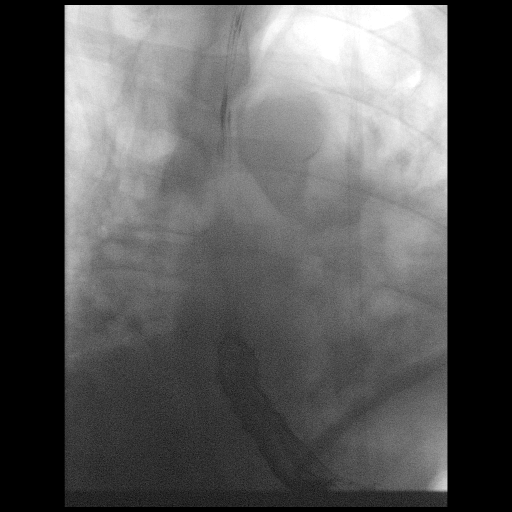

[Series 19: cp_standard · 0.34mm/px · 1 of 8 frames shown (5 of 8)]
[frame 7/8]
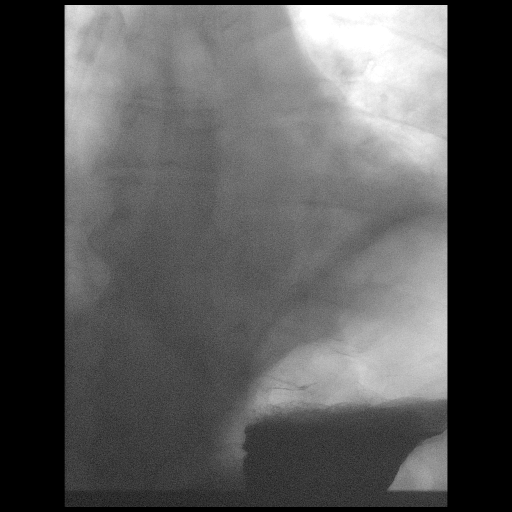

[Series 20: cp_standard · 0.51mm/px · 2 of 65 frames shown (6 of 8)]
[frame 10/65]
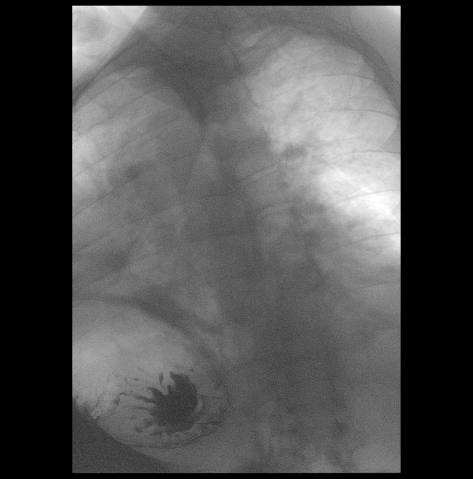
[frame 33/65]
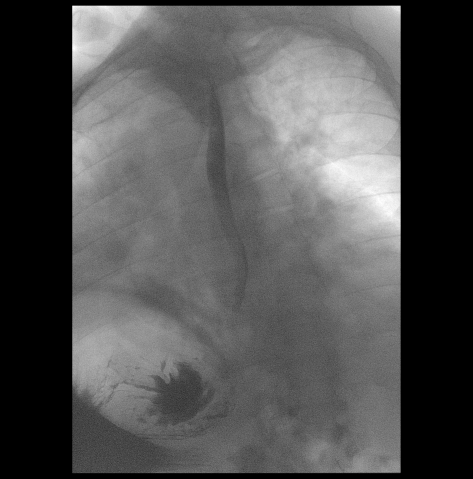

[Series 21: cp_standard · 0.51mm/px · 3 of 60 frames shown (7 of 8)]
[frame 10/60]
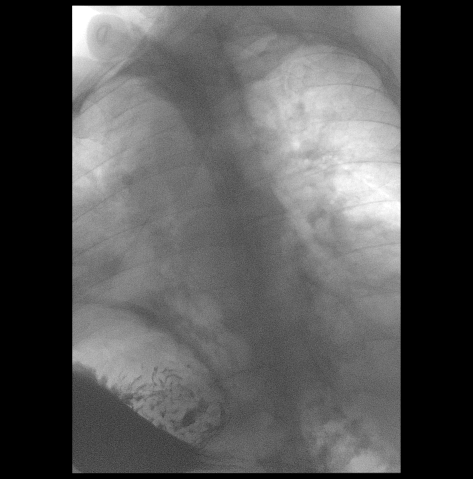
[frame 31/60]
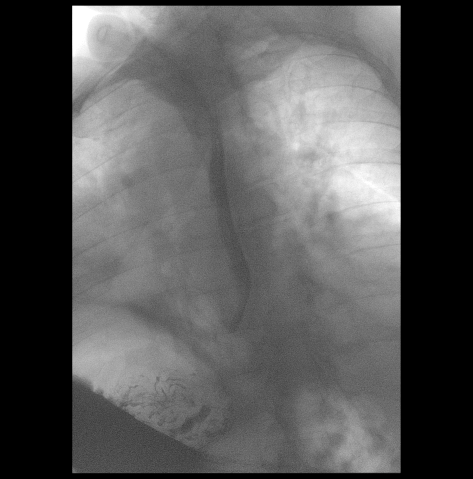
[frame 53/60]
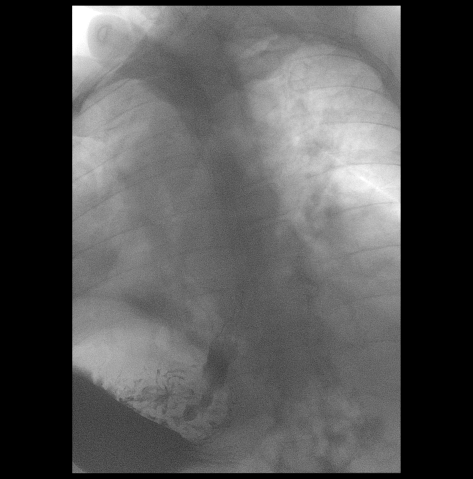

[Series 22: cp_standard · 0.51mm/px · 2 of 165 frames shown (8 of 8)]
[frame 114/165]
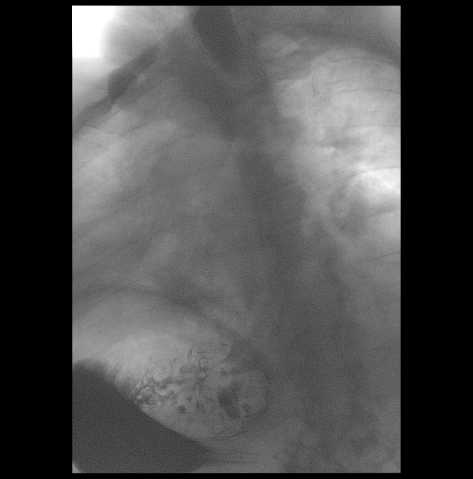
[frame 141/165]
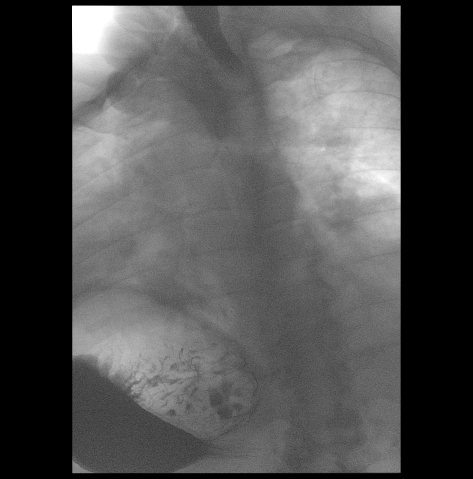

[18 of 24 positions shown; findings below may reference images not displayed]

FINDINGS: A double contrast study was undertaken and the patient tolerated
this well and without difficulty.

Primary peristaltic waves in the esophagus were normal. No
obstruction to the forward flow of contrast throughout the esophagus
and into the stomach.

Normal esophageal course and contour. Normal esophageal mucosal
pattern. No esophageal stricture, ulceration, or other significant
abnormality.

A 13 mm barium tablet passed without difficulty into the stomach.
IMPRESSION: Normal barium esophagram.

## 2018-08-30 IMAGING — DX DG CHEST 1V PORT
1 series · 1 of 1 positions shown · non-contrast
Comparison: CT 03/07/2017

CLINICAL DATA: Post RIGHT lower lobe bronchoscopy

EXAM:
PORTABLE CHEST 1 VIEW

[chest ap]
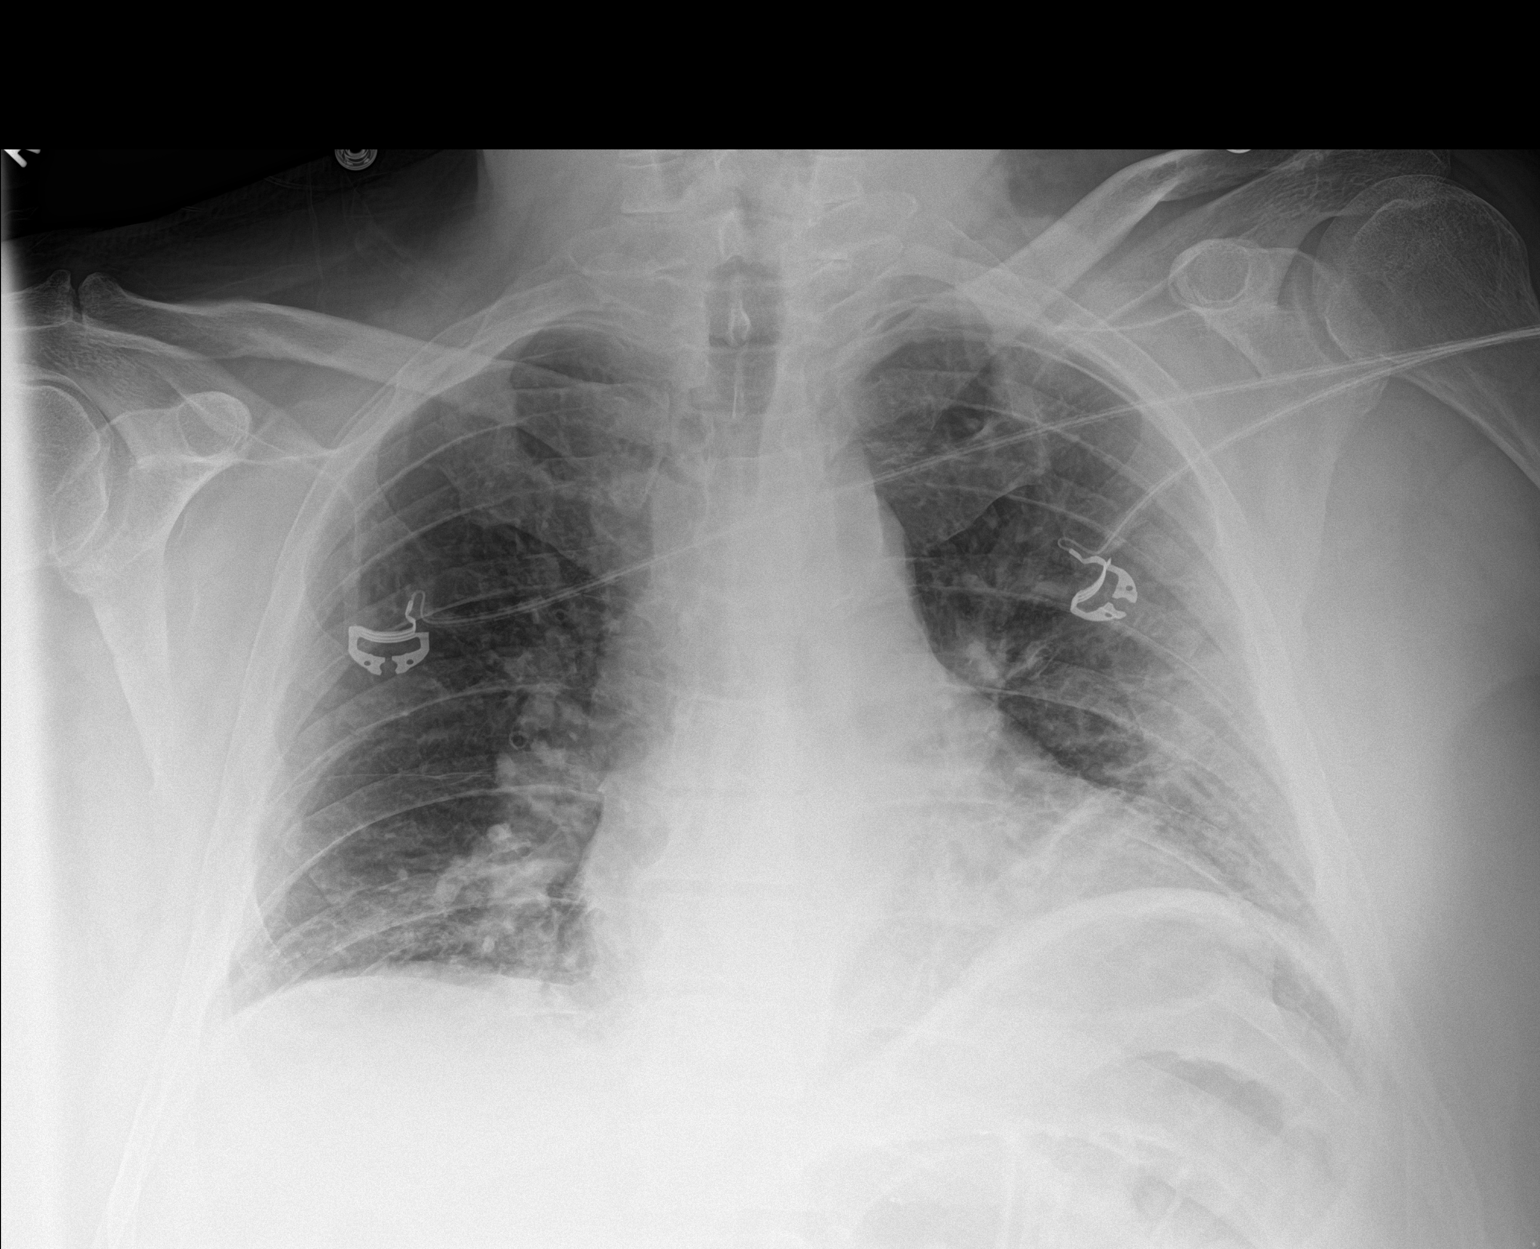

[1 of 1 positions shown; findings below may reference images not displayed]

FINDINGS: Normal cardiac silhouette. LEFT upper lobe and lower lobe airspace
disease again demonstrated RIGHT lung clear. No pneumothorax. No
osseous abnormality
IMPRESSION: Persistent mild LEFT upper lobe LEFT lower lobe pneumonia. No
pneumothorax

## 2018-09-02 ENCOUNTER — Other Ambulatory Visit: Payer: Self-pay | Admitting: Chiropractic Medicine

## 2018-09-02 DIAGNOSIS — M5416 Radiculopathy, lumbar region: Secondary | ICD-10-CM

## 2018-09-13 ENCOUNTER — Ambulatory Visit
Admission: RE | Admit: 2018-09-13 | Discharge: 2018-09-13 | Disposition: A | Discharge status: Home / Self Care | Payer: Medicare Other | Source: Ambulatory Visit | Attending: Chiropractic Medicine | Admitting: Chiropractic Medicine

## 2018-09-13 ENCOUNTER — Other Ambulatory Visit: Payer: Self-pay

## 2018-09-13 DIAGNOSIS — M5416 Radiculopathy, lumbar region: Secondary | ICD-10-CM

## 2018-09-13 DIAGNOSIS — M545 Low back pain: Secondary | ICD-10-CM | POA: Diagnosis not present

## 2018-09-13 MED ORDER — DIAZEPAM 5 MG PO TABS
5.0000 mg | ORAL_TABLET | Freq: Once | ORAL | Status: AC
  Administered 2018-09-13: 14:00:00 5 mg via ORAL

## 2018-09-13 MED ORDER — METHYLPREDNISOLONE ACETATE 40 MG/ML INJ SUSP (RADIOLOG
120.0000 mg | Freq: Once | INTRAMUSCULAR | Status: AC
  Administered 2018-09-13: 120 mg via EPIDURAL

## 2018-09-13 MED ORDER — IOPAMIDOL (ISOVUE-M 200) INJECTION 41%
1.0000 mL | Freq: Once | INTRAMUSCULAR | Status: AC
  Administered 2018-09-13: 15:00:00 1 mL via EPIDURAL

## 2018-09-13 NOTE — Discharge Instructions (Signed)

## 2018-09-23 DIAGNOSIS — G4733 Obstructive sleep apnea (adult) (pediatric): Secondary | ICD-10-CM | POA: Diagnosis not present

## 2018-10-29 DIAGNOSIS — Z23 Encounter for immunization: Secondary | ICD-10-CM | POA: Diagnosis not present

## 2018-11-11 DIAGNOSIS — J479 Bronchiectasis, uncomplicated: Secondary | ICD-10-CM | POA: Diagnosis not present

## 2018-11-11 DIAGNOSIS — R609 Edema, unspecified: Secondary | ICD-10-CM | POA: Diagnosis not present

## 2018-11-11 DIAGNOSIS — E66813 Obesity, class 3: Secondary | ICD-10-CM | POA: Insufficient documentation

## 2018-11-14 DIAGNOSIS — F1010 Alcohol abuse, uncomplicated: Secondary | ICD-10-CM | POA: Diagnosis not present

## 2018-11-14 DIAGNOSIS — I5032 Chronic diastolic (congestive) heart failure: Secondary | ICD-10-CM | POA: Diagnosis not present

## 2018-11-14 DIAGNOSIS — F064 Anxiety disorder due to known physiological condition: Secondary | ICD-10-CM | POA: Diagnosis not present

## 2018-11-14 DIAGNOSIS — I110 Hypertensive heart disease with heart failure: Secondary | ICD-10-CM | POA: Diagnosis not present

## 2018-11-14 DIAGNOSIS — J449 Chronic obstructive pulmonary disease, unspecified: Secondary | ICD-10-CM | POA: Diagnosis not present

## 2018-11-14 DIAGNOSIS — F331 Major depressive disorder, recurrent, moderate: Secondary | ICD-10-CM | POA: Diagnosis not present

## 2018-11-18 DIAGNOSIS — I517 Cardiomegaly: Secondary | ICD-10-CM | POA: Diagnosis not present

## 2018-11-18 DIAGNOSIS — R609 Edema, unspecified: Secondary | ICD-10-CM | POA: Diagnosis not present

## 2018-11-28 DIAGNOSIS — I5032 Chronic diastolic (congestive) heart failure: Secondary | ICD-10-CM | POA: Diagnosis not present

## 2018-11-28 DIAGNOSIS — J449 Chronic obstructive pulmonary disease, unspecified: Secondary | ICD-10-CM | POA: Diagnosis not present

## 2018-11-28 DIAGNOSIS — I110 Hypertensive heart disease with heart failure: Secondary | ICD-10-CM | POA: Diagnosis not present

## 2018-11-28 DIAGNOSIS — F1010 Alcohol abuse, uncomplicated: Secondary | ICD-10-CM | POA: Diagnosis not present

## 2018-11-28 DIAGNOSIS — F064 Anxiety disorder due to known physiological condition: Secondary | ICD-10-CM | POA: Diagnosis not present

## 2018-11-29 DIAGNOSIS — E7800 Pure hypercholesterolemia, unspecified: Secondary | ICD-10-CM | POA: Diagnosis not present

## 2018-11-29 DIAGNOSIS — Z125 Encounter for screening for malignant neoplasm of prostate: Secondary | ICD-10-CM | POA: Diagnosis not present

## 2018-11-29 DIAGNOSIS — R7301 Impaired fasting glucose: Secondary | ICD-10-CM | POA: Diagnosis not present

## 2018-12-05 DIAGNOSIS — I5032 Chronic diastolic (congestive) heart failure: Secondary | ICD-10-CM | POA: Diagnosis not present

## 2018-12-05 DIAGNOSIS — Z0000 Encounter for general adult medical examination without abnormal findings: Secondary | ICD-10-CM | POA: Diagnosis not present

## 2018-12-05 DIAGNOSIS — R82998 Other abnormal findings in urine: Secondary | ICD-10-CM | POA: Diagnosis not present

## 2018-12-05 DIAGNOSIS — M5416 Radiculopathy, lumbar region: Secondary | ICD-10-CM | POA: Diagnosis not present

## 2018-12-05 DIAGNOSIS — I110 Hypertensive heart disease with heart failure: Secondary | ICD-10-CM | POA: Diagnosis not present

## 2018-12-07 DIAGNOSIS — F064 Anxiety disorder due to known physiological condition: Secondary | ICD-10-CM | POA: Diagnosis not present

## 2018-12-07 DIAGNOSIS — F1010 Alcohol abuse, uncomplicated: Secondary | ICD-10-CM | POA: Diagnosis not present

## 2018-12-21 DIAGNOSIS — F064 Anxiety disorder due to known physiological condition: Secondary | ICD-10-CM | POA: Diagnosis not present

## 2018-12-21 DIAGNOSIS — F1010 Alcohol abuse, uncomplicated: Secondary | ICD-10-CM | POA: Diagnosis not present

## 2019-01-19 ENCOUNTER — Other Ambulatory Visit: Payer: Self-pay | Admitting: Chiropractic Medicine

## 2019-01-19 DIAGNOSIS — M5416 Radiculopathy, lumbar region: Secondary | ICD-10-CM

## 2019-01-24 ENCOUNTER — Other Ambulatory Visit: Payer: Self-pay

## 2019-01-24 ENCOUNTER — Ambulatory Visit
Admission: RE | Admit: 2019-01-24 | Discharge: 2019-01-24 | Disposition: A | Discharge status: Home / Self Care | Payer: Medicare Other | Source: Ambulatory Visit | Attending: Chiropractic Medicine | Admitting: Chiropractic Medicine

## 2019-01-24 DIAGNOSIS — M545 Low back pain: Secondary | ICD-10-CM | POA: Diagnosis not present

## 2019-01-24 DIAGNOSIS — M5416 Radiculopathy, lumbar region: Secondary | ICD-10-CM

## 2019-01-24 MED ORDER — METHYLPREDNISOLONE ACETATE 40 MG/ML INJ SUSP (RADIOLOG
120.0000 mg | Freq: Once | INTRAMUSCULAR | Status: AC
  Administered 2019-01-24: 120 mg via EPIDURAL

## 2019-01-24 MED ORDER — DIAZEPAM 5 MG PO TABS
5.0000 mg | ORAL_TABLET | Freq: Once | ORAL | Status: AC
  Administered 2019-01-24: 5 mg via ORAL

## 2019-01-24 MED ORDER — IOPAMIDOL (ISOVUE-M 200) INJECTION 41%
1.0000 mL | Freq: Once | INTRAMUSCULAR | Status: AC
  Administered 2019-01-24: 1 mL via EPIDURAL

## 2019-01-24 NOTE — Discharge Instructions (Signed)

## 2019-02-20 DIAGNOSIS — B349 Viral infection, unspecified: Secondary | ICD-10-CM | POA: Diagnosis not present

## 2019-02-20 DIAGNOSIS — Z20818 Contact with and (suspected) exposure to other bacterial communicable diseases: Secondary | ICD-10-CM | POA: Diagnosis not present

## 2019-02-20 DIAGNOSIS — I110 Hypertensive heart disease with heart failure: Secondary | ICD-10-CM | POA: Diagnosis not present

## 2019-02-20 DIAGNOSIS — R509 Fever, unspecified: Secondary | ICD-10-CM | POA: Diagnosis not present

## 2019-03-19 ENCOUNTER — Ambulatory Visit: Payer: Medicare Other

## 2019-03-24 ENCOUNTER — Ambulatory Visit: Payer: Medicare Other

## 2019-03-25 ENCOUNTER — Ambulatory Visit: Payer: Medicare Other | Attending: Internal Medicine

## 2019-03-25 DIAGNOSIS — Z23 Encounter for immunization: Secondary | ICD-10-CM | POA: Insufficient documentation

## 2019-03-25 NOTE — Progress Notes (Signed)
   Covid-19 Vaccination Clinic  Name:  [name]    MRN: [id number] DOB: 12-24-1955  03/25/2019  Mr. [name] was observed post Covid-19 immunization for 15 minutes without incidence. He was provided with Vaccine Information Sheet and instruction to access the V-Safe system.   Mr. [name] was instructed to call 911 with any severe reactions post vaccine: Marland Kitchen Difficulty breathing  . Swelling of your face and throat  . A fast heartbeat  . A bad rash all over your body  . Dizziness and weakness    Immunizations Administered    Name Date Dose VIS Date Route   Pfizer COVID-19 Vaccine 03/25/2019  9:46 AM 0.3 mL 01/27/2019 Intramuscular   Manufacturer: [employer], [employer]   Lot: [id number]   NDC: [address]-5

## 2019-04-12 ENCOUNTER — Ambulatory Visit: Payer: Medicare Other

## 2019-04-17 DIAGNOSIS — R600 Localized edema: Secondary | ICD-10-CM | POA: Diagnosis not present

## 2019-04-17 DIAGNOSIS — I5032 Chronic diastolic (congestive) heart failure: Secondary | ICD-10-CM | POA: Diagnosis not present

## 2019-04-17 DIAGNOSIS — G4733 Obstructive sleep apnea (adult) (pediatric): Secondary | ICD-10-CM | POA: Diagnosis not present

## 2019-04-17 DIAGNOSIS — H60502 Unspecified acute noninfective otitis externa, left ear: Secondary | ICD-10-CM | POA: Diagnosis not present

## 2019-04-17 DIAGNOSIS — I110 Hypertensive heart disease with heart failure: Secondary | ICD-10-CM | POA: Diagnosis not present

## 2019-04-18 ENCOUNTER — Ambulatory Visit: Payer: Medicare Other | Attending: Internal Medicine

## 2019-04-18 DIAGNOSIS — Z23 Encounter for immunization: Secondary | ICD-10-CM | POA: Insufficient documentation

## 2019-04-18 NOTE — Progress Notes (Signed)
   Covid-19 Vaccination Clinic  Name:  [name]    MRN: [id number] DOB: September 22, 1955  04/18/2019  Mr. Pastorino was observed post Covid-19 immunization for 15 minutes without incident. He was provided with Vaccine Information Sheet and instruction to access the V-Safe system.   Mr. Janssen was instructed to call 911 with any severe reactions post vaccine: Marland Kitchen Difficulty breathing  . Swelling of face and throat  . A fast heartbeat  . A bad rash all over body  . Dizziness and weakness   Immunizations Administered    Name Date Dose VIS Date Route   Pfizer COVID-19 Vaccine 04/18/2019  9:33 AM 0.3 mL 01/27/2019 Intramuscular   Manufacturer: [employer], [employer]   Lot: [id number]   NDC: [address]-1

## 2019-04-28 DIAGNOSIS — J479 Bronchiectasis, uncomplicated: Secondary | ICD-10-CM | POA: Diagnosis not present

## 2019-04-28 DIAGNOSIS — J310 Chronic rhinitis: Secondary | ICD-10-CM | POA: Diagnosis not present

## 2019-04-28 DIAGNOSIS — I5032 Chronic diastolic (congestive) heart failure: Secondary | ICD-10-CM | POA: Diagnosis not present

## 2019-04-28 DIAGNOSIS — G4733 Obstructive sleep apnea (adult) (pediatric): Secondary | ICD-10-CM | POA: Diagnosis not present

## 2019-05-17 DIAGNOSIS — R0600 Dyspnea, unspecified: Secondary | ICD-10-CM | POA: Diagnosis not present

## 2019-05-17 DIAGNOSIS — Z6841 Body Mass Index (BMI) 40.0 and over, adult: Secondary | ICD-10-CM | POA: Diagnosis not present

## 2019-05-17 DIAGNOSIS — G4733 Obstructive sleep apnea (adult) (pediatric): Secondary | ICD-10-CM | POA: Diagnosis not present

## 2019-05-17 DIAGNOSIS — I5032 Chronic diastolic (congestive) heart failure: Secondary | ICD-10-CM | POA: Diagnosis not present

## 2019-05-17 DIAGNOSIS — R609 Edema, unspecified: Secondary | ICD-10-CM | POA: Diagnosis not present

## 2019-05-17 DIAGNOSIS — Z9989 Dependence on other enabling machines and devices: Secondary | ICD-10-CM | POA: Diagnosis not present

## 2019-05-17 DIAGNOSIS — I5033 Acute on chronic diastolic (congestive) heart failure: Secondary | ICD-10-CM | POA: Diagnosis not present

## 2019-05-31 DIAGNOSIS — I5032 Chronic diastolic (congestive) heart failure: Secondary | ICD-10-CM | POA: Diagnosis not present

## 2019-05-31 DIAGNOSIS — R609 Edema, unspecified: Secondary | ICD-10-CM | POA: Diagnosis not present

## 2019-05-31 DIAGNOSIS — H9201 Otalgia, right ear: Secondary | ICD-10-CM | POA: Diagnosis not present

## 2019-05-31 DIAGNOSIS — H6691 Otitis media, unspecified, right ear: Secondary | ICD-10-CM | POA: Diagnosis not present

## 2019-05-31 DIAGNOSIS — I110 Hypertensive heart disease with heart failure: Secondary | ICD-10-CM | POA: Diagnosis not present

## 2019-06-05 DIAGNOSIS — M5416 Radiculopathy, lumbar region: Secondary | ICD-10-CM | POA: Diagnosis not present

## 2019-06-05 DIAGNOSIS — M5412 Radiculopathy, cervical region: Secondary | ICD-10-CM | POA: Diagnosis not present

## 2019-06-06 DIAGNOSIS — H9313 Tinnitus, bilateral: Secondary | ICD-10-CM | POA: Diagnosis not present

## 2019-06-06 DIAGNOSIS — H60331 Swimmer's ear, right ear: Secondary | ICD-10-CM | POA: Diagnosis not present

## 2019-06-06 DIAGNOSIS — H903 Sensorineural hearing loss, bilateral: Secondary | ICD-10-CM | POA: Diagnosis not present

## 2019-06-13 ENCOUNTER — Other Ambulatory Visit: Payer: Self-pay | Admitting: Chiropractic Medicine

## 2019-06-13 DIAGNOSIS — M5416 Radiculopathy, lumbar region: Secondary | ICD-10-CM

## 2019-06-13 NOTE — Discharge Instructions (Signed)

## 2019-06-14 ENCOUNTER — Ambulatory Visit
Admission: RE | Admit: 2019-06-14 | Discharge: 2019-06-14 | Disposition: A | Discharge status: Home / Self Care | Payer: Medicare Other | Source: Ambulatory Visit | Attending: Chiropractic Medicine | Admitting: Chiropractic Medicine

## 2019-06-14 ENCOUNTER — Other Ambulatory Visit: Payer: Self-pay

## 2019-06-14 DIAGNOSIS — M4716 Other spondylosis with myelopathy, lumbar region: Secondary | ICD-10-CM | POA: Diagnosis not present

## 2019-06-14 DIAGNOSIS — M5416 Radiculopathy, lumbar region: Secondary | ICD-10-CM

## 2019-06-14 MED ORDER — METHYLPREDNISOLONE ACETATE 40 MG/ML INJ SUSP (RADIOLOG
120.0000 mg | Freq: Once | INTRAMUSCULAR | Status: AC
  Administered 2019-06-14: 120 mg via EPIDURAL

## 2019-06-14 MED ORDER — IOPAMIDOL (ISOVUE-M 200) INJECTION 41%
1.0000 mL | Freq: Once | INTRAMUSCULAR | Status: AC
  Administered 2019-06-14: 1 mL via EPIDURAL

## 2019-06-28 DIAGNOSIS — I5033 Acute on chronic diastolic (congestive) heart failure: Secondary | ICD-10-CM | POA: Diagnosis not present

## 2019-06-28 DIAGNOSIS — I5032 Chronic diastolic (congestive) heart failure: Secondary | ICD-10-CM | POA: Diagnosis not present

## 2019-06-28 DIAGNOSIS — N182 Chronic kidney disease, stage 2 (mild): Secondary | ICD-10-CM | POA: Diagnosis not present

## 2019-06-28 DIAGNOSIS — G4733 Obstructive sleep apnea (adult) (pediatric): Secondary | ICD-10-CM | POA: Diagnosis not present

## 2019-06-28 DIAGNOSIS — Z9989 Dependence on other enabling machines and devices: Secondary | ICD-10-CM | POA: Diagnosis not present

## 2019-07-03 DIAGNOSIS — Z8669 Personal history of other diseases of the nervous system and sense organs: Secondary | ICD-10-CM | POA: Diagnosis not present

## 2019-07-03 DIAGNOSIS — Z09 Encounter for follow-up examination after completed treatment for conditions other than malignant neoplasm: Secondary | ICD-10-CM | POA: Diagnosis not present

## 2019-07-05 DIAGNOSIS — R609 Edema, unspecified: Secondary | ICD-10-CM | POA: Diagnosis not present

## 2019-07-05 DIAGNOSIS — I5032 Chronic diastolic (congestive) heart failure: Secondary | ICD-10-CM | POA: Diagnosis not present

## 2019-07-05 DIAGNOSIS — N182 Chronic kidney disease, stage 2 (mild): Secondary | ICD-10-CM | POA: Diagnosis not present

## 2019-07-05 DIAGNOSIS — I5033 Acute on chronic diastolic (congestive) heart failure: Secondary | ICD-10-CM | POA: Diagnosis not present

## 2019-07-12 DIAGNOSIS — M5416 Radiculopathy, lumbar region: Secondary | ICD-10-CM | POA: Diagnosis not present

## 2019-07-19 DIAGNOSIS — M5136 Other intervertebral disc degeneration, lumbar region: Secondary | ICD-10-CM | POA: Diagnosis not present

## 2019-07-25 DIAGNOSIS — Z6841 Body Mass Index (BMI) 40.0 and over, adult: Secondary | ICD-10-CM | POA: Diagnosis not present

## 2019-07-25 DIAGNOSIS — M545 Low back pain: Secondary | ICD-10-CM | POA: Diagnosis not present

## 2019-08-04 DIAGNOSIS — E7800 Pure hypercholesterolemia, unspecified: Secondary | ICD-10-CM | POA: Diagnosis not present

## 2019-08-04 DIAGNOSIS — F331 Major depressive disorder, recurrent, moderate: Secondary | ICD-10-CM | POA: Diagnosis not present

## 2019-08-04 DIAGNOSIS — R7301 Impaired fasting glucose: Secondary | ICD-10-CM | POA: Diagnosis not present

## 2019-08-04 DIAGNOSIS — J449 Chronic obstructive pulmonary disease, unspecified: Secondary | ICD-10-CM | POA: Diagnosis not present

## 2019-08-16 DIAGNOSIS — J479 Bronchiectasis, uncomplicated: Secondary | ICD-10-CM | POA: Diagnosis not present

## 2019-08-16 DIAGNOSIS — G4733 Obstructive sleep apnea (adult) (pediatric): Secondary | ICD-10-CM | POA: Diagnosis not present

## 2019-08-16 DIAGNOSIS — Z9989 Dependence on other enabling machines and devices: Secondary | ICD-10-CM | POA: Diagnosis not present

## 2019-08-16 DIAGNOSIS — I5032 Chronic diastolic (congestive) heart failure: Secondary | ICD-10-CM | POA: Diagnosis not present

## 2019-09-12 DIAGNOSIS — Z6841 Body Mass Index (BMI) 40.0 and over, adult: Secondary | ICD-10-CM | POA: Diagnosis not present

## 2019-09-12 DIAGNOSIS — J9811 Atelectasis: Secondary | ICD-10-CM | POA: Diagnosis not present

## 2019-09-12 DIAGNOSIS — Z9989 Dependence on other enabling machines and devices: Secondary | ICD-10-CM | POA: Diagnosis not present

## 2019-09-12 DIAGNOSIS — M79605 Pain in left leg: Secondary | ICD-10-CM | POA: Diagnosis not present

## 2019-09-12 DIAGNOSIS — M79604 Pain in right leg: Secondary | ICD-10-CM | POA: Diagnosis not present

## 2019-09-12 DIAGNOSIS — G4733 Obstructive sleep apnea (adult) (pediatric): Secondary | ICD-10-CM | POA: Diagnosis not present

## 2019-09-12 DIAGNOSIS — I5032 Chronic diastolic (congestive) heart failure: Secondary | ICD-10-CM | POA: Diagnosis not present

## 2019-09-12 DIAGNOSIS — M7989 Other specified soft tissue disorders: Secondary | ICD-10-CM | POA: Diagnosis not present

## 2019-09-12 DIAGNOSIS — R21 Rash and other nonspecific skin eruption: Secondary | ICD-10-CM | POA: Diagnosis not present

## 2019-09-12 DIAGNOSIS — Z87891 Personal history of nicotine dependence: Secondary | ICD-10-CM | POA: Diagnosis not present

## 2019-09-12 DIAGNOSIS — R600 Localized edema: Secondary | ICD-10-CM | POA: Diagnosis not present

## 2019-09-15 DIAGNOSIS — R0600 Dyspnea, unspecified: Secondary | ICD-10-CM | POA: Diagnosis not present

## 2019-09-25 DIAGNOSIS — I5032 Chronic diastolic (congestive) heart failure: Secondary | ICD-10-CM | POA: Diagnosis not present

## 2019-09-25 DIAGNOSIS — J479 Bronchiectasis, uncomplicated: Secondary | ICD-10-CM | POA: Diagnosis not present

## 2019-09-25 DIAGNOSIS — Z6841 Body Mass Index (BMI) 40.0 and over, adult: Secondary | ICD-10-CM | POA: Diagnosis not present

## 2019-09-25 DIAGNOSIS — I5042 Chronic combined systolic (congestive) and diastolic (congestive) heart failure: Secondary | ICD-10-CM | POA: Diagnosis not present

## 2019-09-25 DIAGNOSIS — F319 Bipolar disorder, unspecified: Secondary | ICD-10-CM | POA: Diagnosis not present

## 2019-09-25 DIAGNOSIS — R0602 Shortness of breath: Secondary | ICD-10-CM | POA: Diagnosis not present

## 2019-09-25 DIAGNOSIS — G4733 Obstructive sleep apnea (adult) (pediatric): Secondary | ICD-10-CM | POA: Diagnosis not present

## 2019-10-12 DIAGNOSIS — M8949 Other hypertrophic osteoarthropathy, multiple sites: Secondary | ICD-10-CM | POA: Diagnosis not present

## 2019-10-12 DIAGNOSIS — I10 Essential (primary) hypertension: Secondary | ICD-10-CM | POA: Diagnosis not present

## 2019-10-12 DIAGNOSIS — G4733 Obstructive sleep apnea (adult) (pediatric): Secondary | ICD-10-CM | POA: Diagnosis not present

## 2019-10-12 DIAGNOSIS — I5032 Chronic diastolic (congestive) heart failure: Secondary | ICD-10-CM | POA: Diagnosis not present

## 2019-10-19 DIAGNOSIS — F4321 Adjustment disorder with depressed mood: Secondary | ICD-10-CM | POA: Diagnosis not present

## 2019-10-25 DIAGNOSIS — F3132 Bipolar disorder, current episode depressed, moderate: Secondary | ICD-10-CM | POA: Diagnosis not present

## 2019-10-25 DIAGNOSIS — K760 Fatty (change of) liver, not elsewhere classified: Secondary | ICD-10-CM | POA: Diagnosis not present

## 2019-10-25 DIAGNOSIS — J479 Bronchiectasis, uncomplicated: Secondary | ICD-10-CM | POA: Diagnosis not present

## 2019-10-25 DIAGNOSIS — I5032 Chronic diastolic (congestive) heart failure: Secondary | ICD-10-CM | POA: Diagnosis not present

## 2019-10-25 DIAGNOSIS — I10 Essential (primary) hypertension: Secondary | ICD-10-CM | POA: Diagnosis not present

## 2019-10-25 DIAGNOSIS — Z9989 Dependence on other enabling machines and devices: Secondary | ICD-10-CM | POA: Diagnosis not present

## 2019-10-25 DIAGNOSIS — Z6841 Body Mass Index (BMI) 40.0 and over, adult: Secondary | ICD-10-CM | POA: Diagnosis not present

## 2019-10-25 DIAGNOSIS — M8949 Other hypertrophic osteoarthropathy, multiple sites: Secondary | ICD-10-CM | POA: Diagnosis not present

## 2019-10-25 DIAGNOSIS — Z79899 Other long term (current) drug therapy: Secondary | ICD-10-CM | POA: Diagnosis not present

## 2019-10-25 DIAGNOSIS — M545 Low back pain: Secondary | ICD-10-CM | POA: Diagnosis not present

## 2019-10-25 DIAGNOSIS — Z23 Encounter for immunization: Secondary | ICD-10-CM | POA: Diagnosis not present

## 2019-10-25 DIAGNOSIS — G4733 Obstructive sleep apnea (adult) (pediatric): Secondary | ICD-10-CM | POA: Diagnosis not present

## 2019-11-17 DIAGNOSIS — R0600 Dyspnea, unspecified: Secondary | ICD-10-CM | POA: Diagnosis not present

## 2019-11-17 DIAGNOSIS — Z23 Encounter for immunization: Secondary | ICD-10-CM | POA: Diagnosis not present

## 2019-11-17 DIAGNOSIS — J479 Bronchiectasis, uncomplicated: Secondary | ICD-10-CM | POA: Diagnosis not present

## 2019-11-24 DIAGNOSIS — I10 Essential (primary) hypertension: Secondary | ICD-10-CM | POA: Diagnosis not present

## 2019-11-24 DIAGNOSIS — I5032 Chronic diastolic (congestive) heart failure: Secondary | ICD-10-CM | POA: Diagnosis not present

## 2019-11-24 DIAGNOSIS — M8949 Other hypertrophic osteoarthropathy, multiple sites: Secondary | ICD-10-CM | POA: Diagnosis not present

## 2019-11-24 DIAGNOSIS — M545 Low back pain, unspecified: Secondary | ICD-10-CM | POA: Diagnosis not present

## 2019-11-28 ENCOUNTER — Emergency Department (HOSPITAL_COMMUNITY)
Admission: EM | Admit: 2019-11-28 | Discharge: 2019-11-28 | Discharge status: Against Medical Advice | Payer: Medicare Other | Attending: Emergency Medicine | Admitting: Emergency Medicine

## 2019-11-28 ENCOUNTER — Encounter (HOSPITAL_COMMUNITY): Payer: Self-pay | Admitting: Emergency Medicine

## 2019-11-28 ENCOUNTER — Other Ambulatory Visit: Payer: Self-pay

## 2019-11-28 MED ORDER — IBUPROFEN 400 MG PO TABS
400.0000 mg | ORAL_TABLET | Freq: Once | ORAL | Status: AC | PRN
  Administered 2019-11-28: 400 mg via ORAL
  Filled 2019-11-28: qty 1

## 2019-11-28 NOTE — ED Notes (Signed)
Pt wife said she will be taking him home. Pt was told risk of leaving.

## 2019-11-28 NOTE — ED Triage Notes (Signed)
Pt presents to ED POV. Pt c/o rectal pain. Pt states that he had an impacted bowel and disimpacted himself and since passing that bowel movement he has had severe rectal pain.

## 2019-12-25 DIAGNOSIS — I10 Essential (primary) hypertension: Secondary | ICD-10-CM | POA: Diagnosis not present

## 2019-12-25 DIAGNOSIS — M545 Low back pain, unspecified: Secondary | ICD-10-CM | POA: Diagnosis not present

## 2019-12-25 DIAGNOSIS — M8949 Other hypertrophic osteoarthropathy, multiple sites: Secondary | ICD-10-CM | POA: Diagnosis not present

## 2019-12-25 DIAGNOSIS — I5032 Chronic diastolic (congestive) heart failure: Secondary | ICD-10-CM | POA: Diagnosis not present

## 2020-01-04 DIAGNOSIS — F4323 Adjustment disorder with mixed anxiety and depressed mood: Secondary | ICD-10-CM | POA: Diagnosis not present

## 2020-01-04 DIAGNOSIS — F410 Panic disorder [episodic paroxysmal anxiety] without agoraphobia: Secondary | ICD-10-CM | POA: Diagnosis not present

## 2020-01-15 DIAGNOSIS — F3181 Bipolar II disorder: Secondary | ICD-10-CM | POA: Diagnosis not present

## 2020-01-15 DIAGNOSIS — J449 Chronic obstructive pulmonary disease, unspecified: Secondary | ICD-10-CM | POA: Diagnosis not present

## 2020-01-15 DIAGNOSIS — I10 Essential (primary) hypertension: Secondary | ICD-10-CM | POA: Diagnosis not present

## 2020-01-18 DIAGNOSIS — H938X1 Other specified disorders of right ear: Secondary | ICD-10-CM | POA: Diagnosis not present

## 2020-01-24 DIAGNOSIS — Z79899 Other long term (current) drug therapy: Secondary | ICD-10-CM | POA: Diagnosis not present

## 2020-01-24 DIAGNOSIS — Z6838 Body mass index (BMI) 38.0-38.9, adult: Secondary | ICD-10-CM | POA: Diagnosis not present

## 2020-01-24 DIAGNOSIS — K760 Fatty (change of) liver, not elsewhere classified: Secondary | ICD-10-CM | POA: Diagnosis not present

## 2020-01-24 DIAGNOSIS — I110 Hypertensive heart disease with heart failure: Secondary | ICD-10-CM | POA: Diagnosis not present

## 2020-01-24 DIAGNOSIS — I5032 Chronic diastolic (congestive) heart failure: Secondary | ICD-10-CM | POA: Diagnosis not present

## 2020-01-24 DIAGNOSIS — F3132 Bipolar disorder, current episode depressed, moderate: Secondary | ICD-10-CM | POA: Diagnosis not present

## 2020-01-24 DIAGNOSIS — G4733 Obstructive sleep apnea (adult) (pediatric): Secondary | ICD-10-CM | POA: Diagnosis not present

## 2020-01-24 DIAGNOSIS — J479 Bronchiectasis, uncomplicated: Secondary | ICD-10-CM | POA: Diagnosis not present

## 2020-01-24 DIAGNOSIS — Z9989 Dependence on other enabling machines and devices: Secondary | ICD-10-CM | POA: Diagnosis not present

## 2020-02-26 DIAGNOSIS — Z09 Encounter for follow-up examination after completed treatment for conditions other than malignant neoplasm: Secondary | ICD-10-CM | POA: Diagnosis not present

## 2020-02-26 DIAGNOSIS — Z8669 Personal history of other diseases of the nervous system and sense organs: Secondary | ICD-10-CM | POA: Diagnosis not present

## 2020-03-12 DIAGNOSIS — F331 Major depressive disorder, recurrent, moderate: Secondary | ICD-10-CM | POA: Diagnosis not present

## 2020-03-12 DIAGNOSIS — F900 Attention-deficit hyperactivity disorder, predominantly inattentive type: Secondary | ICD-10-CM | POA: Diagnosis not present

## 2020-03-15 DIAGNOSIS — I5032 Chronic diastolic (congestive) heart failure: Secondary | ICD-10-CM | POA: Diagnosis not present

## 2020-03-15 DIAGNOSIS — I10 Essential (primary) hypertension: Secondary | ICD-10-CM | POA: Diagnosis not present

## 2020-03-15 DIAGNOSIS — Z0000 Encounter for general adult medical examination without abnormal findings: Secondary | ICD-10-CM | POA: Diagnosis not present

## 2020-03-18 DIAGNOSIS — R3912 Poor urinary stream: Secondary | ICD-10-CM | POA: Diagnosis not present

## 2020-03-18 DIAGNOSIS — N401 Enlarged prostate with lower urinary tract symptoms: Secondary | ICD-10-CM | POA: Diagnosis not present

## 2020-03-18 DIAGNOSIS — Z111 Encounter for screening for respiratory tuberculosis: Secondary | ICD-10-CM | POA: Diagnosis not present

## 2020-03-18 DIAGNOSIS — R350 Frequency of micturition: Secondary | ICD-10-CM | POA: Diagnosis not present

## 2020-03-21 DIAGNOSIS — F331 Major depressive disorder, recurrent, moderate: Secondary | ICD-10-CM | POA: Diagnosis not present

## 2020-03-21 DIAGNOSIS — F900 Attention-deficit hyperactivity disorder, predominantly inattentive type: Secondary | ICD-10-CM | POA: Diagnosis not present

## 2020-03-29 DIAGNOSIS — F331 Major depressive disorder, recurrent, moderate: Secondary | ICD-10-CM | POA: Diagnosis not present

## 2020-03-29 DIAGNOSIS — F900 Attention-deficit hyperactivity disorder, predominantly inattentive type: Secondary | ICD-10-CM | POA: Diagnosis not present

## 2020-04-08 DIAGNOSIS — F900 Attention-deficit hyperactivity disorder, predominantly inattentive type: Secondary | ICD-10-CM | POA: Diagnosis not present

## 2020-04-08 DIAGNOSIS — F331 Major depressive disorder, recurrent, moderate: Secondary | ICD-10-CM | POA: Diagnosis not present

## 2020-04-11 DIAGNOSIS — M8949 Other hypertrophic osteoarthropathy, multiple sites: Secondary | ICD-10-CM | POA: Diagnosis not present

## 2020-04-11 DIAGNOSIS — I10 Essential (primary) hypertension: Secondary | ICD-10-CM | POA: Diagnosis not present

## 2020-04-11 DIAGNOSIS — M545 Low back pain, unspecified: Secondary | ICD-10-CM | POA: Diagnosis not present

## 2020-04-11 DIAGNOSIS — I5032 Chronic diastolic (congestive) heart failure: Secondary | ICD-10-CM | POA: Diagnosis not present

## 2020-04-22 DIAGNOSIS — F331 Major depressive disorder, recurrent, moderate: Secondary | ICD-10-CM | POA: Diagnosis not present

## 2020-04-22 DIAGNOSIS — F900 Attention-deficit hyperactivity disorder, predominantly inattentive type: Secondary | ICD-10-CM | POA: Diagnosis not present

## 2020-04-29 DIAGNOSIS — F900 Attention-deficit hyperactivity disorder, predominantly inattentive type: Secondary | ICD-10-CM | POA: Diagnosis not present

## 2020-04-29 DIAGNOSIS — F331 Major depressive disorder, recurrent, moderate: Secondary | ICD-10-CM | POA: Diagnosis not present

## 2020-05-08 DIAGNOSIS — J310 Chronic rhinitis: Secondary | ICD-10-CM | POA: Diagnosis not present

## 2020-05-08 DIAGNOSIS — I5032 Chronic diastolic (congestive) heart failure: Secondary | ICD-10-CM | POA: Diagnosis not present

## 2020-05-08 DIAGNOSIS — G4733 Obstructive sleep apnea (adult) (pediatric): Secondary | ICD-10-CM | POA: Diagnosis not present

## 2020-05-08 DIAGNOSIS — Z23 Encounter for immunization: Secondary | ICD-10-CM | POA: Diagnosis not present

## 2020-05-13 DIAGNOSIS — F900 Attention-deficit hyperactivity disorder, predominantly inattentive type: Secondary | ICD-10-CM | POA: Diagnosis not present

## 2020-05-13 DIAGNOSIS — F331 Major depressive disorder, recurrent, moderate: Secondary | ICD-10-CM | POA: Diagnosis not present

## 2020-06-13 DIAGNOSIS — R7301 Impaired fasting glucose: Secondary | ICD-10-CM | POA: Diagnosis not present

## 2020-06-13 DIAGNOSIS — M159 Polyosteoarthritis, unspecified: Secondary | ICD-10-CM | POA: Diagnosis not present

## 2020-06-13 DIAGNOSIS — I10 Essential (primary) hypertension: Secondary | ICD-10-CM | POA: Diagnosis not present

## 2020-06-13 DIAGNOSIS — I5032 Chronic diastolic (congestive) heart failure: Secondary | ICD-10-CM | POA: Diagnosis not present

## 2020-06-13 DIAGNOSIS — M545 Low back pain, unspecified: Secondary | ICD-10-CM | POA: Diagnosis not present

## 2020-07-18 DIAGNOSIS — G894 Chronic pain syndrome: Secondary | ICD-10-CM | POA: Diagnosis not present

## 2020-07-18 DIAGNOSIS — M5416 Radiculopathy, lumbar region: Secondary | ICD-10-CM | POA: Diagnosis not present

## 2020-07-18 DIAGNOSIS — Z79899 Other long term (current) drug therapy: Secondary | ICD-10-CM | POA: Diagnosis not present

## 2020-07-24 DIAGNOSIS — R079 Chest pain, unspecified: Secondary | ICD-10-CM | POA: Diagnosis not present

## 2020-07-24 DIAGNOSIS — F321 Major depressive disorder, single episode, moderate: Secondary | ICD-10-CM | POA: Diagnosis not present

## 2020-07-24 DIAGNOSIS — F3132 Bipolar disorder, current episode depressed, moderate: Secondary | ICD-10-CM | POA: Diagnosis not present

## 2020-07-24 DIAGNOSIS — I5032 Chronic diastolic (congestive) heart failure: Secondary | ICD-10-CM | POA: Diagnosis not present

## 2020-08-12 DIAGNOSIS — J449 Chronic obstructive pulmonary disease, unspecified: Secondary | ICD-10-CM | POA: Diagnosis not present

## 2020-08-12 DIAGNOSIS — I10 Essential (primary) hypertension: Secondary | ICD-10-CM | POA: Diagnosis not present

## 2020-08-12 DIAGNOSIS — J40 Bronchitis, not specified as acute or chronic: Secondary | ICD-10-CM | POA: Diagnosis not present

## 2020-08-12 DIAGNOSIS — I5032 Chronic diastolic (congestive) heart failure: Secondary | ICD-10-CM | POA: Diagnosis not present

## 2020-08-16 DIAGNOSIS — M545 Low back pain, unspecified: Secondary | ICD-10-CM | POA: Diagnosis not present

## 2020-08-16 DIAGNOSIS — I10 Essential (primary) hypertension: Secondary | ICD-10-CM | POA: Diagnosis not present

## 2020-08-16 DIAGNOSIS — I5032 Chronic diastolic (congestive) heart failure: Secondary | ICD-10-CM | POA: Diagnosis not present

## 2020-08-16 DIAGNOSIS — M159 Polyosteoarthritis, unspecified: Secondary | ICD-10-CM | POA: Diagnosis not present

## 2020-10-15 DIAGNOSIS — G4733 Obstructive sleep apnea (adult) (pediatric): Secondary | ICD-10-CM | POA: Diagnosis not present

## 2020-10-17 DIAGNOSIS — M545 Low back pain, unspecified: Secondary | ICD-10-CM | POA: Diagnosis not present

## 2020-10-17 DIAGNOSIS — M159 Polyosteoarthritis, unspecified: Secondary | ICD-10-CM | POA: Diagnosis not present

## 2020-10-17 DIAGNOSIS — I10 Essential (primary) hypertension: Secondary | ICD-10-CM | POA: Diagnosis not present

## 2020-10-17 DIAGNOSIS — I5032 Chronic diastolic (congestive) heart failure: Secondary | ICD-10-CM | POA: Diagnosis not present

## 2020-11-13 DIAGNOSIS — Z23 Encounter for immunization: Secondary | ICD-10-CM | POA: Diagnosis not present

## 2020-11-13 DIAGNOSIS — F3132 Bipolar disorder, current episode depressed, moderate: Secondary | ICD-10-CM | POA: Diagnosis not present

## 2020-11-13 DIAGNOSIS — I5032 Chronic diastolic (congestive) heart failure: Secondary | ICD-10-CM | POA: Diagnosis not present

## 2020-11-13 DIAGNOSIS — G4733 Obstructive sleep apnea (adult) (pediatric): Secondary | ICD-10-CM | POA: Diagnosis not present

## 2020-11-13 DIAGNOSIS — Z6838 Body mass index (BMI) 38.0-38.9, adult: Secondary | ICD-10-CM | POA: Diagnosis not present

## 2020-12-26 DIAGNOSIS — M5416 Radiculopathy, lumbar region: Secondary | ICD-10-CM | POA: Diagnosis not present

## 2021-01-29 DIAGNOSIS — M545 Low back pain, unspecified: Secondary | ICD-10-CM | POA: Diagnosis not present

## 2021-01-29 DIAGNOSIS — M159 Polyosteoarthritis, unspecified: Secondary | ICD-10-CM | POA: Diagnosis not present

## 2021-01-29 DIAGNOSIS — I5032 Chronic diastolic (congestive) heart failure: Secondary | ICD-10-CM | POA: Diagnosis not present

## 2021-01-29 DIAGNOSIS — I10 Essential (primary) hypertension: Secondary | ICD-10-CM | POA: Diagnosis not present

## 2021-03-19 DIAGNOSIS — I5032 Chronic diastolic (congestive) heart failure: Secondary | ICD-10-CM | POA: Diagnosis not present

## 2021-03-19 DIAGNOSIS — I10 Essential (primary) hypertension: Secondary | ICD-10-CM | POA: Diagnosis not present

## 2021-03-19 DIAGNOSIS — M159 Polyosteoarthritis, unspecified: Secondary | ICD-10-CM | POA: Diagnosis not present

## 2021-03-19 DIAGNOSIS — M545 Low back pain, unspecified: Secondary | ICD-10-CM | POA: Diagnosis not present

## 2021-03-21 DIAGNOSIS — J449 Chronic obstructive pulmonary disease, unspecified: Secondary | ICD-10-CM | POA: Diagnosis not present

## 2021-03-21 DIAGNOSIS — Z0000 Encounter for general adult medical examination without abnormal findings: Secondary | ICD-10-CM | POA: Diagnosis not present

## 2021-03-21 DIAGNOSIS — F322 Major depressive disorder, single episode, severe without psychotic features: Secondary | ICD-10-CM | POA: Diagnosis not present

## 2021-03-21 DIAGNOSIS — Z23 Encounter for immunization: Secondary | ICD-10-CM | POA: Diagnosis not present

## 2021-03-21 DIAGNOSIS — I10 Essential (primary) hypertension: Secondary | ICD-10-CM | POA: Diagnosis not present

## 2021-03-21 DIAGNOSIS — N529 Male erectile dysfunction, unspecified: Secondary | ICD-10-CM | POA: Diagnosis not present

## 2021-03-21 DIAGNOSIS — F3181 Bipolar II disorder: Secondary | ICD-10-CM | POA: Diagnosis not present

## 2021-03-24 ENCOUNTER — Other Ambulatory Visit: Payer: Self-pay | Admitting: Internal Medicine

## 2021-03-24 ENCOUNTER — Ambulatory Visit
Admission: RE | Admit: 2021-03-24 | Discharge: 2021-03-24 | Disposition: A | Discharge status: Home / Self Care | Payer: Medicare Other | Source: Ambulatory Visit | Attending: Internal Medicine | Admitting: Internal Medicine

## 2021-03-24 DIAGNOSIS — J209 Acute bronchitis, unspecified: Secondary | ICD-10-CM | POA: Diagnosis not present

## 2021-03-24 DIAGNOSIS — H66003 Acute suppurative otitis media without spontaneous rupture of ear drum, bilateral: Secondary | ICD-10-CM | POA: Diagnosis not present

## 2021-03-24 DIAGNOSIS — R0602 Shortness of breath: Secondary | ICD-10-CM | POA: Diagnosis not present

## 2021-03-24 DIAGNOSIS — R059 Cough, unspecified: Secondary | ICD-10-CM | POA: Diagnosis not present

## 2021-03-24 DIAGNOSIS — R079 Chest pain, unspecified: Secondary | ICD-10-CM | POA: Diagnosis not present

## 2021-03-24 DIAGNOSIS — J449 Chronic obstructive pulmonary disease, unspecified: Secondary | ICD-10-CM | POA: Diagnosis not present

## 2021-03-29 ENCOUNTER — Emergency Department (HOSPITAL_BASED_OUTPATIENT_CLINIC_OR_DEPARTMENT_OTHER): Payer: Medicare Other | Admitting: Radiology

## 2021-03-29 ENCOUNTER — Emergency Department (HOSPITAL_BASED_OUTPATIENT_CLINIC_OR_DEPARTMENT_OTHER): Payer: Medicare Other

## 2021-03-29 ENCOUNTER — Other Ambulatory Visit: Payer: Self-pay

## 2021-03-29 ENCOUNTER — Emergency Department (HOSPITAL_BASED_OUTPATIENT_CLINIC_OR_DEPARTMENT_OTHER)
Admission: EM | Admit: 2021-03-29 | Discharge: 2021-03-29 | Disposition: A | Discharge status: Home / Self Care | Payer: Medicare Other | Attending: Emergency Medicine | Admitting: Emergency Medicine

## 2021-03-29 ENCOUNTER — Encounter (HOSPITAL_BASED_OUTPATIENT_CLINIC_OR_DEPARTMENT_OTHER): Payer: Self-pay

## 2021-03-29 DIAGNOSIS — J40 Bronchitis, not specified as acute or chronic: Secondary | ICD-10-CM | POA: Diagnosis not present

## 2021-03-29 DIAGNOSIS — R059 Cough, unspecified: Secondary | ICD-10-CM | POA: Diagnosis not present

## 2021-03-29 DIAGNOSIS — R001 Bradycardia, unspecified: Secondary | ICD-10-CM | POA: Diagnosis not present

## 2021-03-29 DIAGNOSIS — I509 Heart failure, unspecified: Secondary | ICD-10-CM | POA: Insufficient documentation

## 2021-03-29 DIAGNOSIS — R519 Headache, unspecified: Secondary | ICD-10-CM | POA: Diagnosis not present

## 2021-03-29 DIAGNOSIS — M79662 Pain in left lower leg: Secondary | ICD-10-CM | POA: Diagnosis not present

## 2021-03-29 DIAGNOSIS — Z7951 Long term (current) use of inhaled steroids: Secondary | ICD-10-CM | POA: Diagnosis not present

## 2021-03-29 DIAGNOSIS — M79605 Pain in left leg: Secondary | ICD-10-CM | POA: Diagnosis not present

## 2021-03-29 DIAGNOSIS — R062 Wheezing: Secondary | ICD-10-CM | POA: Diagnosis not present

## 2021-03-29 DIAGNOSIS — J449 Chronic obstructive pulmonary disease, unspecified: Secondary | ICD-10-CM | POA: Diagnosis not present

## 2021-03-29 LAB — BASIC METABOLIC PANEL
Anion gap: 7 (ref 5–15)
BUN: 17 mg/dL (ref 8–23)
CO2: 29 mmol/L (ref 22–32)
Calcium: 9.5 mg/dL (ref 8.9–10.3)
Chloride: 102 mmol/L (ref 98–111)
Creatinine, Ser: 1.06 mg/dL (ref 0.61–1.24)
GFR, Estimated: >60 mL/min (ref >60)
Glucose, Bld: 106 mg/dL — ABNORMAL HIGH (ref 70–99)
Potassium: 4.3 mmol/L (ref 3.5–5.1)
Sodium: 138 mmol/L (ref 135–145)

## 2021-03-29 LAB — CBC WITH DIFFERENTIAL/PLATELET
Abs Immature Granulocytes: 0.1 10*3/uL — ABNORMAL HIGH (ref 0.00–0.07)
Basophils Absolute: 0.1 10*3/uL (ref 0.0–0.1)
Basophils Relative: 1 %
Eosinophils Absolute: 0.5 10*3/uL (ref 0.0–0.5)
Eosinophils Relative: 6 %
HCT: 38.7 % — ABNORMAL LOW (ref 39.0–52.0)
Hemoglobin: 12.8 g/dL — ABNORMAL LOW (ref 13.0–17.0)
Immature Granulocytes: 1 %
Lymphocytes Relative: 34 %
Lymphs Abs: 2.9 10*3/uL (ref 0.7–4.0)
MCH: 31 pg (ref 26.0–34.0)
MCHC: 33.1 g/dL (ref 30.0–36.0)
MCV: 93.7 fL (ref 80.0–100.0)
Monocytes Absolute: 0.6 10*3/uL (ref 0.1–1.0)
Monocytes Relative: 7 %
Neutro Abs: 4.2 10*3/uL (ref 1.7–7.7)
Neutrophils Relative %: 51 %
Platelets: 243 10*3/uL (ref 150–400)
RBC: 4.13 MIL/uL — ABNORMAL LOW (ref 4.22–5.81)
RDW: 12.3 % (ref 11.5–15.5)
WBC: 8.3 10*3/uL (ref 4.0–10.5)
nRBC: 0 % (ref 0.0–0.2)

## 2021-03-29 LAB — BRAIN NATRIURETIC PEPTIDE: B Natriuretic Peptide: 11.2 pg/mL (ref 0.0–100.0)

## 2021-03-29 MED ORDER — HYDROCOD POLI-CHLORPHE POLI ER 10-8 MG/5ML PO SUER
5.0000 mL | Freq: Once | ORAL | Status: AC
  Administered 2021-03-29: 5 mL via ORAL
  Filled 2021-03-29: qty 5

## 2021-03-29 MED ORDER — IPRATROPIUM-ALBUTEROL 0.5-2.5 (3) MG/3ML IN SOLN
3.0000 mL | Freq: Once | RESPIRATORY_TRACT | Status: AC
  Administered 2021-03-29: 3 mL via RESPIRATORY_TRACT
  Filled 2021-03-29: qty 3

## 2021-03-29 MED ORDER — HYDROCOD POLI-CHLORPHE POLI ER 10-8 MG/5ML PO SUER: 5.0000 mL | Freq: Two times a day (BID) | ORAL | 0 refills | Status: DC | PRN

## 2021-03-29 MED ORDER — PREDNISONE 20 MG PO TABS: 40.0000 mg | ORAL_TABLET | Freq: Every day | ORAL | 0 refills | Status: AC

## 2021-03-29 NOTE — ED Provider Notes (Signed)
[facility]   CSN: [id number] Arrival date & time: 03/29/21  1042     History  Chief Complaint  Patient presents with   Leg Pain    [name] is a 66 y.o. male.  Patient with history of COPD, congestive heart failure on torsemide presents to the emergency department today for evaluation of left lower extremity swelling and pain.  This started about 3 to 4 days ago and has been gradually worsening.  Patient states that the pain is mainly behind the left knee but radiates into the left calf.  No right sided lower extremity symptoms. Patient denies risk factors for pulmonary embolism including: unilateral leg swelling, history of DVT/PE/other blood clots, use of exogenous hormones, recent immobilizations, recent surgery, recent travel (>4hr segment), malignancy, hemoptysis.   Patient has also had a cough and wheezing over the past week.  This is caused him headache.  He feels the cough is getting worse.  He has been taking albuterol breathing treatments at home.  States that his primary care doctor prescribed him a small amount of cough medication.  He is not on steroids at the current time.  [facility]  He has a history of pneumonia.      Home Medications Prior to Admission medications   Medication Sig Start Date End Date Taking? Authorizing Provider  albuterol (PROAIR HFA) 108 (90 Base) MCG/ACT inhaler Inhale 2 puffs into the lungs every 6 (six) hours as needed for wheezing or shortness of breath. 09/09/17   Armandina StammerNwoko, Agnes I, NP  albuterol (PROVENTIL) (2.5 MG/3ML) 0.083% nebulizer solution Take 3 mLs (2.5 mg total) by nebulization every 6 (six) hours as needed for wheezing or shortness of breath. 09/09/17   Armandina StammerNwoko, Agnes I, NP  atorvastatin (LIPITOR) 10 MG tablet Take 10 mg by mouth daily.    [provider]  escitalopram (LEXAPRO) 10 MG tablet Take 10 mg by mouth daily.    [provider]   Fluticasone-Umeclidin-Vilant (TRELEGY ELLIPTA) 100-62.5-25 MCG/INH AEPB Inhale into the lungs daily.    [provider]  furosemide (LASIX) 40 MG tablet Take 40 mg by mouth daily.    [provider]  gabapentin (NEURONTIN) 300 MG capsule Take 2 capsules (600 mg total) by mouth 3 (three) times daily. For agitation Patient taking differently: Take 800 mg by mouth 3 (three) times daily. For agitation 09/09/17   Armandina StammerNwoko, Agnes I, NP  hydrOXYzine (VISTARIL) 25 MG capsule Take 1 capsule by mouth daily. 07/19/18   [provider]  Iron Combinations (IRON COMPLEX PO) Take 65 mg by mouth daily.    [provider]  losartan (COZAAR) 100 MG tablet TAKE 1 TABLET BY MOUTH DAILY. 03/29/18   Shelva MajesticHunter, Stephen O, MD  Melatonin 5 MG TABS Take 1 tablet (5 mg total) by mouth at bedtime. For sleep 09/09/17   Armandina StammerNwoko, Agnes I, NP  naproxen sodium (ALEVE) 220 MG tablet Take 220 mg by mouth daily as needed.    [provider]  nitroGLYCERIN (NITROSTAT) 0.4 MG SL tablet Place 1 tablet (0.4 mg total) under the tongue every 5 (five) minutes as needed for chest pain. 07/26/18 10/24/18  Yates DecampGanji, Jay, MD  spironolactone (ALDACTONE) 25 MG tablet Take 1 tablet (25 mg total) by mouth every morning. 08/22/18 11/20/18  Yates DecampGanji, Jay, MD  traMADol (ULTRAM) 50 MG tablet 2 (two) times a day. 08/18/18   [provider]  traZODone (DESYREL) 100 MG tablet Take 2 tablets  by mouth daily. 08/01/18   [provider]      Allergies    Omeprazole    Review of Systems   Review of Systems  Physical Exam Updated Vital Signs BP 133/79 (BP Location: Right Arm)    Pulse 63    Temp 98 F (36.7 C)    Resp 18    Ht 5\' 10"  (1.778 m)    Wt 122.5 kg    SpO2 99%    BMI 38.74 kg/m  Physical Exam Vitals and nursing note reviewed.  Constitutional:      General: He is not in acute distress.    Appearance: He is well-developed.  HENT:     Head: Normocephalic and atraumatic.     Right Ear: External ear  normal.     Left Ear: External ear normal.     Nose: No congestion.  Eyes:     General:        Right eye: No discharge.        Left eye: No discharge.     Conjunctiva/sclera: Conjunctivae normal.  Cardiovascular:     Rate and Rhythm: Normal rate and regular rhythm.     Heart sounds: Normal heart sounds.  Pulmonary:     Effort: Pulmonary effort is normal.     Breath sounds: Wheezing present.     Comments: Frequent coughing during exam. Abdominal:     Palpations: Abdomen is soft.     Tenderness: There is no abdominal tenderness. There is no guarding or rebound.  Musculoskeletal:     Cervical back: Normal range of motion and neck supple.     Comments: Left lower extremity: Mild edema to the left popliteal area into the left calf with associated tenderness to palpation.  No erythema or warmth.    Right lower extremity: No significant edema or tenderness  Skin:    General: Skin is warm and dry.  Neurological:     Mental Status: He is alert.     Comments: I watched the patient ambulate up out of bed to the restroom with a slight limp    ED Results / Procedures / Treatments   Labs (all labs ordered are listed, but only abnormal results are displayed) Labs Reviewed  CBC WITH DIFFERENTIAL/PLATELET - Abnormal; Notable for the following components:      Result Value   RBC 4.13 (*)    Hemoglobin 12.8 (*)    HCT 38.7 (*)    Abs Immature Granulocytes 0.10 (*)    All other components within normal limits  BASIC METABOLIC PANEL - Abnormal; Notable for the following components:   Glucose, Bld 106 (*)    All other components within normal limits  BRAIN NATRIURETIC PEPTIDE   ED ECG REPORT   Date: 03/29/2021  Rate: 58  Rhythm: sinus bradycardia  QRS Axis: normal  Intervals: normal  ST/T Wave abnormalities: normal  Conduction Disutrbances:none  Narrative Interpretation:   Old EKG Reviewed: unchanged from 06/2018  I have personally reviewed the EKG tracing and agree with the  computerized printout as noted.   Radiology DG Chest 2 View  Result Date: 03/29/2021 CLINICAL DATA:  66 year old male with history of cough and wheezing. EXAM: CHEST - 2 VIEW COMPARISON:  Chest x-ray 03/24/2021. FINDINGS: Lung volumes are normal. No consolidative airspace disease. No pleural effusions. No pneumothorax. No pulmonary nodule or mass noted. Pulmonary vasculature and the cardiomediastinal silhouette are within normal limits. IMPRESSION: No radiographic evidence of acute cardiopulmonary disease. Electronically Signed  By: Vinnie Langton M.D.   On: 03/29/2021 12:19   US Venous Img Lower  Left (DVT Study)  Result Date: 03/29/2021 CLINICAL DATA:  Chronic left lower leg pain. EXAM: LEFT LOWER EXTREMITY VENOUS DOPPLER ULTRASOUND TECHNIQUE: Gray-scale sonography with compression, as well as color and duplex ultrasound, were performed to evaluate the deep venous system(s) from the level of the common femoral vein through the popliteal and proximal calf veins. COMPARISON:  Left lower extremity venous ultrasound 09/04/2011 FINDINGS: VENOUS Normal compressibility of the common femoral, superficial femoral, and popliteal veins, as well as the visualized calf veins. Visualized portions of profunda femoral vein and great saphenous vein unremarkable. No filling defects to suggest DVT on grayscale or color Doppler imaging. Doppler waveforms show normal direction of venous flow, normal respiratory plasticity and response to augmentation. Limited views of the contralateral common femoral vein are unremarkable. OTHER None. Limitations: none IMPRESSION: Negative. Electronically Signed   By: Audie Pinto M.D.   On: 03/29/2021 13:52    Procedures Procedures    Medications Ordered in ED Medications  chlorpheniramine-HYDROcodone 10-8 MG/5ML suspension 5 mL (has no administration in time range)  ipratropium-albuterol (DUONEB) 0.5-2.5 (3) MG/3ML nebulizer solution 3 mL (3 mLs Nebulization Given 03/29/21  1119)    ED Course/ Medical Decision Making/ A&P    Patient seen and examined. History obtained directly from patient.   Labs/EKG: Ordered CBC, BMP, BNP given history of heart failure, lower extremity swelling and coughing.  Imaging: Chest x-ray ordered in triage, reviewed, no pneumonia.  Medications/Fluids: Ordered: Cough medication. Considered administration of: Steroids but will hold as patient does have some difficulty tolerating these, will readdress.  Most recent vital signs reviewed and are as follows: BP 133/79 (BP Location: Right Arm)    Pulse 63    Temp 98 F (36.7 C)    Resp 18    Ht 5\' 10"  (1.778 m)    Wt 122.5 kg    SpO2 99%    BMI 38.74 kg/m   Initial impression: Left lower extremity pain and swelling, rule out DVT.  COPD flare/bronchitis, rule out pneumonia.  2:32 PM Reassessment performed. Patient appears comfortable, states that cough medication helped calm his cough.   Reviewed additional pertinent lab work and imaging with patient at bedside including: Normal white blood cell count, negative x-ray, negative DVT study.  Most current vital signs reviewed and are as follows: BP 126/80    Pulse (!) 57    Temp 98 F (36.7 C)    Resp 15    Ht 5\' 10"  (1.778 m)    Wt 122.5 kg    SpO2 100%    BMI 38.74 kg/m   Plan: Discharge to home  Home treatment: Prescription written for prednisone.  Patient is willing to take this if he needs it for his cough and wheezing.  Also given course of Tussionex.  Also encouraged use of home albuterol every 4 hours over the next couple of days.  Patient and wife who is now at bedside, counseled on use of narcotic cough medications. Counseled not to combine these medications with others containing tylenol. Urged not to drink alcohol, drive, or perform any other activities that requires focus while taking these medications. The patient verbalizes understanding and agrees with the plan.  Return and follow-up instructions: Encouraged return to ED  with worsening shortness of breath, trouble breathing, chest pain. Encouraged patient to follow-up with their provider in 5-7 days. Patient verbalized understanding and agreed with plan.  Medical Decision Making Amount and/or Complexity of Data Reviewed Labs: ordered. Radiology: ordered. ECG/medicine tests: ordered.  Risk Prescription drug management.   Patient's main concern today was left lower extremity pain and swelling.  DVT study was negative.  No signs of abscess or cellulitis on exam.  Likely musculoskeletal in nature.  BNP not suggestive of heart failure exacerbation and chest x-ray is clear.  Patient also has ongoing cough and wheezing.  He does have a history of COPD.  He was treated with albuterol and Atrovent in the ED as well as Tussionex.  He has had some improvement.  I think he likely has a bronchitis and this is causing COPD to flare.  He is already on antibiotics for an ear infection.  Added prednisone 40 mg x 5 days.        Final Clinical Impression(s) / ED Diagnoses Final diagnoses:  Bronchitis  Pain of left lower extremity    Rx / DC Orders ED Discharge Orders          Ordered    chlorpheniramine-HYDROcodone (TUSSIONEX PENNKINETIC ER) 10-8 MG/5ML  Every 12 hours PRN        03/29/21 1429    predniSONE (DELTASONE) 20 MG tablet  Daily        03/29/21 1429              Carlisle Cater, PA-C 03/29/21 1434    Gareth Morgan, MD 03/30/21 [phone]

## 2021-03-29 NOTE — ED Notes (Signed)
Ultrasound at bedside

## 2021-03-29 NOTE — Discharge Instructions (Signed)
Please read and follow all provided instructions.  Your diagnoses today include:  1. Bronchitis   2. Pain of left lower extremity     Tests performed today include: Chest x-ray - does not show any pneumonia Blood cell counts and electrolytes Screening test for heart failure: Was normal Ultrasound of your left leg: Did not show any signs of blood clots Vital signs. See below for your results today.   Medications prescribed:  Prednisone - steroid medicine   It is best to take this medication in the morning to prevent sleeping problems. If you are diabetic, monitor your blood sugar closely and stop taking Prednisone if blood sugar is over 300. Take with food to prevent stomach upset.   Tussinex - narcotic cough suppressant syrup  You have been prescribed narcotic cough suppressant such as Tussinex: DO NOT drive or perform any activities that require you to be awake and alert because this medicine can make you drowsy.   Take any prescribed medications only as directed.  Home care instructions:  Follow any educational materials contained in this packet.  Follow-up instructions: Please follow-up with your primary care provider in the next 7 days for further evaluation of your symptoms and a recheck if you are not feeling better.   Return instructions:  Please return to the Emergency Department if you experience worsening symptoms. Please return with worsening wheezing, shortness of breath, or difficulty breathing. Return with persistent fever above 101F.  Please return if you have any other emergent concerns.  Additional Information:  Your vital signs today were: BP 126/80    Pulse (!) 57    Temp 98 F (36.7 C)    Resp 15    Ht 5\' 10"  (1.778 m)    Wt 122.5 kg    SpO2 100%    BMI 38.74 kg/m  If your blood pressure (BP) was elevated above 135/85 this visit, please have this repeated by your doctor within one month. --------------

## 2021-03-29 NOTE — ED Triage Notes (Addendum)
Pt presents POV with several day history of left knee/thigh pain and swelling, leg is not tender to the touch but he does have limited mobility. Denies fall or injury. He was also diagnosed with bronchitis on Tuesday and has cough/wheezing while in triage.  Has history of COPD and pnuemonia.

## 2021-04-04 DIAGNOSIS — M25562 Pain in left knee: Secondary | ICD-10-CM | POA: Diagnosis not present

## 2021-04-25 DIAGNOSIS — M25562 Pain in left knee: Secondary | ICD-10-CM | POA: Diagnosis not present

## 2021-05-06 DIAGNOSIS — M25562 Pain in left knee: Secondary | ICD-10-CM | POA: Diagnosis not present

## 2021-05-09 DIAGNOSIS — M25562 Pain in left knee: Secondary | ICD-10-CM | POA: Diagnosis not present

## 2021-05-11 DIAGNOSIS — M5416 Radiculopathy, lumbar region: Secondary | ICD-10-CM | POA: Diagnosis not present

## 2021-05-11 DIAGNOSIS — M5459 Other low back pain: Secondary | ICD-10-CM | POA: Diagnosis not present

## 2021-05-13 DIAGNOSIS — M25562 Pain in left knee: Secondary | ICD-10-CM | POA: Diagnosis not present

## 2021-05-28 DIAGNOSIS — F1521 Other stimulant dependence, in remission: Secondary | ICD-10-CM | POA: Diagnosis not present

## 2021-05-28 DIAGNOSIS — F902 Attention-deficit hyperactivity disorder, combined type: Secondary | ICD-10-CM | POA: Diagnosis not present

## 2021-05-28 DIAGNOSIS — F332 Major depressive disorder, recurrent severe without psychotic features: Secondary | ICD-10-CM | POA: Diagnosis not present

## 2021-05-28 DIAGNOSIS — F4310 Post-traumatic stress disorder, unspecified: Secondary | ICD-10-CM | POA: Diagnosis not present

## 2021-06-10 DIAGNOSIS — S83242D Other tear of medial meniscus, current injury, left knee, subsequent encounter: Secondary | ICD-10-CM | POA: Diagnosis not present

## 2021-06-10 DIAGNOSIS — M25562 Pain in left knee: Secondary | ICD-10-CM | POA: Diagnosis not present

## 2021-06-25 DIAGNOSIS — M94262 Chondromalacia, left knee: Secondary | ICD-10-CM | POA: Diagnosis not present

## 2021-06-25 DIAGNOSIS — S83272A Complex tear of lateral meniscus, current injury, left knee, initial encounter: Secondary | ICD-10-CM | POA: Diagnosis not present

## 2021-06-25 DIAGNOSIS — S83232A Complex tear of medial meniscus, current injury, left knee, initial encounter: Secondary | ICD-10-CM | POA: Diagnosis not present

## 2021-06-25 DIAGNOSIS — G8918 Other acute postprocedural pain: Secondary | ICD-10-CM | POA: Diagnosis not present

## 2021-07-22 DIAGNOSIS — M25562 Pain in left knee: Secondary | ICD-10-CM | POA: Diagnosis not present

## 2021-08-04 DIAGNOSIS — F3181 Bipolar II disorder: Secondary | ICD-10-CM | POA: Diagnosis not present

## 2021-08-04 DIAGNOSIS — N401 Enlarged prostate with lower urinary tract symptoms: Secondary | ICD-10-CM | POA: Diagnosis not present

## 2021-08-04 DIAGNOSIS — Z125 Encounter for screening for malignant neoplasm of prostate: Secondary | ICD-10-CM | POA: Diagnosis not present

## 2021-08-04 DIAGNOSIS — R39198 Other difficulties with micturition: Secondary | ICD-10-CM | POA: Diagnosis not present

## 2021-08-04 DIAGNOSIS — I5032 Chronic diastolic (congestive) heart failure: Secondary | ICD-10-CM | POA: Diagnosis not present

## 2021-08-12 DIAGNOSIS — M25562 Pain in left knee: Secondary | ICD-10-CM | POA: Diagnosis not present

## 2021-08-15 DIAGNOSIS — M25562 Pain in left knee: Secondary | ICD-10-CM | POA: Diagnosis not present

## 2021-08-22 DIAGNOSIS — M25562 Pain in left knee: Secondary | ICD-10-CM | POA: Diagnosis not present

## 2021-08-26 DIAGNOSIS — M25562 Pain in left knee: Secondary | ICD-10-CM | POA: Diagnosis not present

## 2021-08-27 DIAGNOSIS — I5032 Chronic diastolic (congestive) heart failure: Secondary | ICD-10-CM | POA: Diagnosis not present

## 2021-08-27 DIAGNOSIS — F321 Major depressive disorder, single episode, moderate: Secondary | ICD-10-CM | POA: Diagnosis not present

## 2021-08-27 DIAGNOSIS — F3132 Bipolar disorder, current episode depressed, moderate: Secondary | ICD-10-CM | POA: Diagnosis not present

## 2021-08-27 DIAGNOSIS — I10 Essential (primary) hypertension: Secondary | ICD-10-CM | POA: Diagnosis not present

## 2021-08-27 DIAGNOSIS — K760 Fatty (change of) liver, not elsewhere classified: Secondary | ICD-10-CM | POA: Diagnosis not present

## 2021-09-03 DIAGNOSIS — F1521 Other stimulant dependence, in remission: Secondary | ICD-10-CM | POA: Diagnosis not present

## 2021-09-03 DIAGNOSIS — F4310 Post-traumatic stress disorder, unspecified: Secondary | ICD-10-CM | POA: Diagnosis not present

## 2021-09-03 DIAGNOSIS — F332 Major depressive disorder, recurrent severe without psychotic features: Secondary | ICD-10-CM | POA: Diagnosis not present

## 2021-09-03 DIAGNOSIS — F331 Major depressive disorder, recurrent, moderate: Secondary | ICD-10-CM | POA: Diagnosis not present

## 2021-09-03 DIAGNOSIS — F902 Attention-deficit hyperactivity disorder, combined type: Secondary | ICD-10-CM | POA: Diagnosis not present

## 2021-09-10 DIAGNOSIS — M25562 Pain in left knee: Secondary | ICD-10-CM | POA: Diagnosis not present

## 2021-09-12 DIAGNOSIS — M25562 Pain in left knee: Secondary | ICD-10-CM | POA: Diagnosis not present

## 2021-09-15 DIAGNOSIS — Z5181 Encounter for therapeutic drug level monitoring: Secondary | ICD-10-CM | POA: Diagnosis not present

## 2021-09-15 DIAGNOSIS — M5416 Radiculopathy, lumbar region: Secondary | ICD-10-CM | POA: Diagnosis not present

## 2021-09-15 DIAGNOSIS — T402X5A Adverse effect of other opioids, initial encounter: Secondary | ICD-10-CM | POA: Diagnosis not present

## 2021-09-15 DIAGNOSIS — Z79899 Other long term (current) drug therapy: Secondary | ICD-10-CM | POA: Diagnosis not present

## 2021-09-15 DIAGNOSIS — M542 Cervicalgia: Secondary | ICD-10-CM | POA: Diagnosis not present

## 2021-09-19 DIAGNOSIS — I5032 Chronic diastolic (congestive) heart failure: Secondary | ICD-10-CM | POA: Diagnosis not present

## 2021-09-19 DIAGNOSIS — J449 Chronic obstructive pulmonary disease, unspecified: Secondary | ICD-10-CM | POA: Diagnosis not present

## 2021-09-19 DIAGNOSIS — N401 Enlarged prostate with lower urinary tract symptoms: Secondary | ICD-10-CM | POA: Diagnosis not present

## 2021-09-19 DIAGNOSIS — I110 Hypertensive heart disease with heart failure: Secondary | ICD-10-CM | POA: Diagnosis not present

## 2021-09-22 DIAGNOSIS — F331 Major depressive disorder, recurrent, moderate: Secondary | ICD-10-CM | POA: Diagnosis not present

## 2021-09-29 DIAGNOSIS — F331 Major depressive disorder, recurrent, moderate: Secondary | ICD-10-CM | POA: Diagnosis not present

## 2021-10-01 DIAGNOSIS — Z4789 Encounter for other orthopedic aftercare: Secondary | ICD-10-CM | POA: Diagnosis not present

## 2021-10-01 DIAGNOSIS — R350 Frequency of micturition: Secondary | ICD-10-CM | POA: Diagnosis not present

## 2021-10-01 DIAGNOSIS — M25562 Pain in left knee: Secondary | ICD-10-CM | POA: Diagnosis not present

## 2021-10-01 DIAGNOSIS — N401 Enlarged prostate with lower urinary tract symptoms: Secondary | ICD-10-CM | POA: Diagnosis not present

## 2021-10-08 DIAGNOSIS — M25562 Pain in left knee: Secondary | ICD-10-CM | POA: Diagnosis not present

## 2021-10-15 DIAGNOSIS — M25562 Pain in left knee: Secondary | ICD-10-CM | POA: Diagnosis not present

## 2021-10-15 DIAGNOSIS — M1712 Unilateral primary osteoarthritis, left knee: Secondary | ICD-10-CM | POA: Diagnosis not present

## 2021-10-16 DIAGNOSIS — F331 Major depressive disorder, recurrent, moderate: Secondary | ICD-10-CM | POA: Diagnosis not present

## 2021-11-05 DIAGNOSIS — R269 Unspecified abnormalities of gait and mobility: Secondary | ICD-10-CM | POA: Diagnosis not present

## 2021-11-05 DIAGNOSIS — M25562 Pain in left knee: Secondary | ICD-10-CM | POA: Diagnosis not present

## 2021-11-10 DIAGNOSIS — R269 Unspecified abnormalities of gait and mobility: Secondary | ICD-10-CM | POA: Diagnosis not present

## 2021-11-10 DIAGNOSIS — M25562 Pain in left knee: Secondary | ICD-10-CM | POA: Diagnosis not present

## 2021-11-11 DIAGNOSIS — F331 Major depressive disorder, recurrent, moderate: Secondary | ICD-10-CM | POA: Diagnosis not present

## 2021-11-12 DIAGNOSIS — M25562 Pain in left knee: Secondary | ICD-10-CM | POA: Diagnosis not present

## 2021-11-12 DIAGNOSIS — R269 Unspecified abnormalities of gait and mobility: Secondary | ICD-10-CM | POA: Diagnosis not present

## 2021-11-17 DIAGNOSIS — M25562 Pain in left knee: Secondary | ICD-10-CM | POA: Diagnosis not present

## 2021-11-17 DIAGNOSIS — R269 Unspecified abnormalities of gait and mobility: Secondary | ICD-10-CM | POA: Diagnosis not present

## 2021-11-24 DIAGNOSIS — F322 Major depressive disorder, single episode, severe without psychotic features: Secondary | ICD-10-CM | POA: Diagnosis not present

## 2021-11-24 DIAGNOSIS — I10 Essential (primary) hypertension: Secondary | ICD-10-CM | POA: Diagnosis not present

## 2021-11-24 DIAGNOSIS — Z01818 Encounter for other preprocedural examination: Secondary | ICD-10-CM | POA: Diagnosis not present

## 2021-11-24 DIAGNOSIS — Z23 Encounter for immunization: Secondary | ICD-10-CM | POA: Diagnosis not present

## 2021-11-26 DIAGNOSIS — F902 Attention-deficit hyperactivity disorder, combined type: Secondary | ICD-10-CM | POA: Diagnosis not present

## 2021-11-26 DIAGNOSIS — F4310 Post-traumatic stress disorder, unspecified: Secondary | ICD-10-CM | POA: Diagnosis not present

## 2021-11-26 DIAGNOSIS — F332 Major depressive disorder, recurrent severe without psychotic features: Secondary | ICD-10-CM | POA: Diagnosis not present

## 2021-11-26 DIAGNOSIS — F1521 Other stimulant dependence, in remission: Secondary | ICD-10-CM | POA: Diagnosis not present

## 2021-11-27 DIAGNOSIS — R269 Unspecified abnormalities of gait and mobility: Secondary | ICD-10-CM | POA: Diagnosis not present

## 2021-11-27 DIAGNOSIS — M25562 Pain in left knee: Secondary | ICD-10-CM | POA: Diagnosis not present

## 2021-12-02 DIAGNOSIS — F331 Major depressive disorder, recurrent, moderate: Secondary | ICD-10-CM | POA: Diagnosis not present

## 2021-12-24 DIAGNOSIS — I5032 Chronic diastolic (congestive) heart failure: Secondary | ICD-10-CM | POA: Diagnosis not present

## 2021-12-24 DIAGNOSIS — K760 Fatty (change of) liver, not elsewhere classified: Secondary | ICD-10-CM | POA: Diagnosis not present

## 2021-12-24 DIAGNOSIS — I10 Essential (primary) hypertension: Secondary | ICD-10-CM | POA: Diagnosis not present

## 2022-01-06 DIAGNOSIS — F331 Major depressive disorder, recurrent, moderate: Secondary | ICD-10-CM | POA: Diagnosis not present

## 2022-01-19 DIAGNOSIS — M25562 Pain in left knee: Secondary | ICD-10-CM | POA: Diagnosis not present

## 2022-01-19 DIAGNOSIS — T402X5A Adverse effect of other opioids, initial encounter: Secondary | ICD-10-CM | POA: Diagnosis not present

## 2022-01-19 DIAGNOSIS — M5416 Radiculopathy, lumbar region: Secondary | ICD-10-CM | POA: Diagnosis not present

## 2022-01-19 DIAGNOSIS — M5459 Other low back pain: Secondary | ICD-10-CM | POA: Diagnosis not present

## 2022-01-21 DIAGNOSIS — F331 Major depressive disorder, recurrent, moderate: Secondary | ICD-10-CM | POA: Diagnosis not present

## 2022-02-11 ENCOUNTER — Other Ambulatory Visit (HOSPITAL_COMMUNITY): Payer: Medicare Other

## 2022-02-20 ENCOUNTER — Ambulatory Visit: Admit: 2022-02-20 | Payer: Medicare Other | Admitting: Orthopedic Surgery

## 2022-02-20 SURGERY — ARTHROPLASTY, KNEE, TOTAL
Anesthesia: Spinal | Site: Knee | Laterality: Left

## 2022-02-25 DIAGNOSIS — F331 Major depressive disorder, recurrent, moderate: Secondary | ICD-10-CM | POA: Diagnosis not present

## 2022-03-17 DIAGNOSIS — F332 Major depressive disorder, recurrent severe without psychotic features: Secondary | ICD-10-CM | POA: Diagnosis not present

## 2022-04-07 DIAGNOSIS — R635 Abnormal weight gain: Secondary | ICD-10-CM | POA: Diagnosis not present

## 2022-04-07 DIAGNOSIS — Z6838 Body mass index (BMI) 38.0-38.9, adult: Secondary | ICD-10-CM | POA: Diagnosis not present

## 2022-04-10 ENCOUNTER — Other Ambulatory Visit: Payer: Self-pay

## 2022-04-10 ENCOUNTER — Ambulatory Visit
Admission: RE | Admit: 2022-04-10 | Discharge: 2022-04-10 | Disposition: A | Discharge status: Home / Self Care | Payer: Medicare Other | Source: Ambulatory Visit | Attending: Internal Medicine | Admitting: Internal Medicine

## 2022-04-10 ENCOUNTER — Other Ambulatory Visit: Payer: Self-pay | Admitting: Internal Medicine

## 2022-04-10 DIAGNOSIS — R519 Headache, unspecified: Secondary | ICD-10-CM | POA: Diagnosis not present

## 2022-04-22 DIAGNOSIS — F322 Major depressive disorder, single episode, severe without psychotic features: Secondary | ICD-10-CM | POA: Diagnosis not present

## 2022-04-22 DIAGNOSIS — Z1159 Encounter for screening for other viral diseases: Secondary | ICD-10-CM | POA: Diagnosis not present

## 2022-04-22 DIAGNOSIS — E261 Secondary hyperaldosteronism: Secondary | ICD-10-CM | POA: Diagnosis not present

## 2022-04-22 DIAGNOSIS — Z1331 Encounter for screening for depression: Secondary | ICD-10-CM | POA: Diagnosis not present

## 2022-04-22 DIAGNOSIS — I700 Atherosclerosis of aorta: Secondary | ICD-10-CM | POA: Diagnosis not present

## 2022-04-22 DIAGNOSIS — Z0000 Encounter for general adult medical examination without abnormal findings: Secondary | ICD-10-CM | POA: Diagnosis not present

## 2022-04-22 DIAGNOSIS — D84821 Immunodeficiency due to drugs: Secondary | ICD-10-CM | POA: Diagnosis not present

## 2022-04-23 DIAGNOSIS — F5081 Binge eating disorder: Secondary | ICD-10-CM | POA: Diagnosis not present

## 2022-04-23 DIAGNOSIS — I5030 Unspecified diastolic (congestive) heart failure: Secondary | ICD-10-CM | POA: Diagnosis not present

## 2022-04-23 DIAGNOSIS — I110 Hypertensive heart disease with heart failure: Secondary | ICD-10-CM | POA: Diagnosis not present

## 2022-04-23 DIAGNOSIS — G4733 Obstructive sleep apnea (adult) (pediatric): Secondary | ICD-10-CM | POA: Diagnosis not present

## 2022-05-12 DIAGNOSIS — G4733 Obstructive sleep apnea (adult) (pediatric): Secondary | ICD-10-CM | POA: Diagnosis not present

## 2022-05-20 DIAGNOSIS — Z6836 Body mass index (BMI) 36.0-36.9, adult: Secondary | ICD-10-CM | POA: Diagnosis not present

## 2022-05-20 DIAGNOSIS — E669 Obesity, unspecified: Secondary | ICD-10-CM | POA: Diagnosis not present

## 2022-05-21 DIAGNOSIS — M542 Cervicalgia: Secondary | ICD-10-CM | POA: Diagnosis not present

## 2022-05-21 DIAGNOSIS — M5459 Other low back pain: Secondary | ICD-10-CM | POA: Diagnosis not present

## 2022-05-21 DIAGNOSIS — T402X5A Adverse effect of other opioids, initial encounter: Secondary | ICD-10-CM | POA: Diagnosis not present

## 2022-05-21 DIAGNOSIS — M5416 Radiculopathy, lumbar region: Secondary | ICD-10-CM | POA: Diagnosis not present

## 2022-06-01 DIAGNOSIS — E669 Obesity, unspecified: Secondary | ICD-10-CM | POA: Diagnosis not present

## 2022-06-01 DIAGNOSIS — Z6836 Body mass index (BMI) 36.0-36.9, adult: Secondary | ICD-10-CM | POA: Diagnosis not present

## 2022-06-01 DIAGNOSIS — Z713 Dietary counseling and surveillance: Secondary | ICD-10-CM | POA: Diagnosis not present

## 2022-06-05 DIAGNOSIS — J479 Bronchiectasis, uncomplicated: Secondary | ICD-10-CM | POA: Diagnosis not present

## 2022-06-11 DIAGNOSIS — E669 Obesity, unspecified: Secondary | ICD-10-CM | POA: Diagnosis not present

## 2022-06-11 DIAGNOSIS — I110 Hypertensive heart disease with heart failure: Secondary | ICD-10-CM | POA: Diagnosis not present

## 2022-06-11 DIAGNOSIS — I5032 Chronic diastolic (congestive) heart failure: Secondary | ICD-10-CM | POA: Diagnosis not present

## 2022-06-11 DIAGNOSIS — Z6836 Body mass index (BMI) 36.0-36.9, adult: Secondary | ICD-10-CM | POA: Diagnosis not present

## 2022-06-12 DIAGNOSIS — G4733 Obstructive sleep apnea (adult) (pediatric): Secondary | ICD-10-CM | POA: Diagnosis not present

## 2022-06-16 DIAGNOSIS — F332 Major depressive disorder, recurrent severe without psychotic features: Secondary | ICD-10-CM | POA: Diagnosis not present

## 2022-06-17 DIAGNOSIS — F5081 Binge eating disorder: Secondary | ICD-10-CM | POA: Diagnosis not present

## 2022-06-24 DIAGNOSIS — I5032 Chronic diastolic (congestive) heart failure: Secondary | ICD-10-CM | POA: Diagnosis not present

## 2022-06-24 DIAGNOSIS — K760 Fatty (change of) liver, not elsewhere classified: Secondary | ICD-10-CM | POA: Diagnosis not present

## 2022-06-24 DIAGNOSIS — I10 Essential (primary) hypertension: Secondary | ICD-10-CM | POA: Diagnosis not present

## 2022-06-24 NOTE — H&P (Signed)
Patient's anticipated LOS is less than 2 midnights, meeting these requirements: - Younger than 24 - Lives within 1 hour of care - Has a competent adult at home to recover with post-op recover - NO history of  - Chronic pain requiring opiods  - Diabetes  - Coronary Artery Disease  - Heart failure  - Heart attack  - Stroke  - DVT/VTE  - Cardiac arrhythmia  - Respiratory Failure/COPD  - Renal failure  - Anemia  - Advanced Liver disease     [name] is an 67 y.o. male.    Chief Complaint: left knee pain  HPI: Pt is a 67 y.o. male complaining of left knee pain for multiple years. Pain had continually increased since the beginning. X-rays in the clinic show end-stage arthritic changes of the left knee. Pt has tried various conservative treatments which have failed to alleviate their symptoms, including injections and therapy. Various options are discussed with the patient. Risks, benefits and expectations were discussed with the patient. Patient understand the risks, benefits and expectations and wishes to proceed with surgery.   PCP:  Gaspar Garbe, MD  D/C Plans: Home  PMH: Past Medical History:  Diagnosis Date   ADD (attention deficit disorder)    Anxiety    Panic   Bipolar disorder (HCC)    GERD (gastroesophageal reflux disease)    ? they thought GERD caused cough.    Mental disorder    ADD, bipolar   Pneumonia 04/12/2013   Shortness of breath    Substance abuse (HCC)     PSH: Past Surgical History:  Procedure Laterality Date   APPENDECTOMY  1988   COLONOSCOPY     TONSILLECTOMY     VIDEO BRONCHOSCOPY Bilateral 10/10/2013   Procedure: VIDEO BRONCHOSCOPY WITH FLUORO;  Surgeon: Oretha Milch, MD;  [facility] ENDOSCOPY;  Service: Cardiopulmonary;  Laterality: Bilateral;   VIDEO BRONCHOSCOPY N/A 10/13/2013   Procedure: VIDEO BRONCHOSCOPY, REMOVAL OF ENDOBRONCHIAL LESION, PROBABLE FOREIGN BODY;  Surgeon: Delight Ovens, MD;  Location: [facility] OR;  Service:  Thoracic;  Laterality: N/A;   VIDEO BRONCHOSCOPY N/A 11/28/2013   Procedure: VIDEO BRONCHOSCOPY;  Surgeon: Delight Ovens, MD;  Location: [facility] OR;  Service: Thoracic;  Laterality: N/A;  bronchoscopy for removal of foreign body   VIDEO BRONCHOSCOPY Bilateral 03/10/2017   Procedure: VIDEO BRONCHOSCOPY WITH FLUORO;  Surgeon: Oretha Milch, MD;  Location: [facility] ENDOSCOPY;  Service: Cardiopulmonary;  Laterality: Bilateral;    Social History:  reports that he quit smoking about 29 years ago. His smoking use included cigarettes. He has a 20.00 pack-year smoking history. He has never used smokeless tobacco. He reports that he does not drink alcohol and does not use drugs. BMI: Estimated body mass index is 38.74 kg/m as calculated from the following:   Height as of 03/29/21: 5\' 10"  (1.778 m).   Weight as of 03/29/21: 122.5 kg.  Lab Results  Component Value Date   ALBUMIN 4.1 09/02/2017   Diabetes: Patient does not have a diagnosis of diabetes.     Smoking Status:   reports that he quit smoking about 29 years ago. His smoking use included cigarettes. He has a 20.00 pack-year smoking history. He has never used smokeless tobacco.    Allergies:  Allergies  Allergen Reactions   Omeprazole Diarrhea and Nausea And Vomiting    Medications: No current facility-administered medications for this encounter.   Current Outpatient Medications  Medication Sig Dispense Refill   albuterol (PROAIR HFA) 108 (  90 Base) MCG/ACT inhaler Inhale 2 puffs into the lungs every 6 (six) hours as needed for wheezing or shortness of breath.     albuterol (PROVENTIL) (2.5 MG/3ML) 0.083% nebulizer solution Take 3 mLs (2.5 mg total) by nebulization every 6 (six) hours as needed for wheezing or shortness of breath. 75 mL 12   atorvastatin (LIPITOR) 10 MG tablet Take 10 mg by mouth daily.     chlorpheniramine-HYDROcodone (TUSSIONEX PENNKINETIC ER) 10-8 MG/5ML Take 5 mLs by mouth every 12 (twelve) hours as needed for cough. 70  mL 0   escitalopram (LEXAPRO) 10 MG tablet Take 10 mg by mouth daily.     Fluticasone-Umeclidin-Vilant (TRELEGY ELLIPTA) 100-62.5-25 MCG/INH AEPB Inhale into the lungs daily.     furosemide (LASIX) 40 MG tablet Take 40 mg by mouth daily.     gabapentin (NEURONTIN) 300 MG capsule Take 2 capsules (600 mg total) by mouth 3 (three) times daily. For agitation (Patient taking differently: Take 800 mg by mouth 3 (three) times daily. For agitation)     hydrOXYzine (VISTARIL) 25 MG capsule Take 1 capsule by mouth daily.     Iron Combinations (IRON COMPLEX PO) Take 65 mg by mouth daily.     losartan (COZAAR) 100 MG tablet TAKE 1 TABLET BY MOUTH DAILY. 90 tablet 0   Melatonin 5 MG TABS Take 1 tablet (5 mg total) by mouth at bedtime. For sleep  0   naproxen sodium (ALEVE) 220 MG tablet Take 220 mg by mouth daily as needed.     nitroGLYCERIN (NITROSTAT) 0.4 MG SL tablet Place 1 tablet (0.4 mg total) under the tongue every 5 (five) minutes as needed for chest pain. 25 tablet 3   predniSONE (DELTASONE) 20 MG tablet Take 2 tablets (40 mg total) by mouth daily. 10 tablet 0   spironolactone (ALDACTONE) 25 MG tablet Take 1 tablet (25 mg total) by mouth every morning. 90 tablet 1   traMADol (ULTRAM) 50 MG tablet 2 (two) times a day.     traZODone (DESYREL) 100 MG tablet Take 2 tablets by mouth daily.      No results found for this or any previous visit (from the past 48 hour(s)). No results found.  ROS: Pain with rom of the left lower extremity  Physical Exam: Alert and oriented 67 y.o. male in no acute distress Cranial nerves 2-12 intact Cervical spine: full rom with no tenderness, nv intact distally Chest: active breath sounds bilaterally, no wheeze rhonchi or rales Heart: regular rate and rhythm, no murmur Abd: non tender non distended with active bowel sounds Hip is stable with rom  Left knee painful rom with crepitus Nv intact distally No rashes or edema distally Antalgic  gait  Assessment/Plan Assessment: left knee end stage osteoarthritis  Plan:  Patient will undergo a left total knee by Dr. Ranell Patrick at [place] Risks benefits and expectations were discussed with the patient. Patient understand risks, benefits and expectations and wishes to proceed. Preoperative templating of the joint replacement has been completed, documented, and submitted to the Operating Room personnel in order to optimize intra-operative equipment management.   Alphonsa Overall PA-C, [facility] is now [employer] [address], [place], [place] [address] Phone: [phone] [email or web address] Facebook  [employer]

## 2022-06-25 ENCOUNTER — Other Ambulatory Visit: Payer: Self-pay | Admitting: Internal Medicine

## 2022-06-25 ENCOUNTER — Ambulatory Visit
Admission: RE | Admit: 2022-06-25 | Discharge: 2022-06-25 | Disposition: A | Discharge status: Home / Self Care | Payer: Medicare Other | Source: Ambulatory Visit | Attending: Internal Medicine | Admitting: Internal Medicine

## 2022-06-25 DIAGNOSIS — S99922A Unspecified injury of left foot, initial encounter: Secondary | ICD-10-CM

## 2022-06-25 DIAGNOSIS — M79675 Pain in left toe(s): Secondary | ICD-10-CM | POA: Diagnosis not present

## 2022-06-25 DIAGNOSIS — M179 Osteoarthritis of knee, unspecified: Secondary | ICD-10-CM | POA: Diagnosis not present

## 2022-06-25 DIAGNOSIS — I5032 Chronic diastolic (congestive) heart failure: Secondary | ICD-10-CM | POA: Diagnosis not present

## 2022-06-25 DIAGNOSIS — F3181 Bipolar II disorder: Secondary | ICD-10-CM | POA: Diagnosis not present

## 2022-06-25 DIAGNOSIS — S90112A Contusion of left great toe without damage to nail, initial encounter: Secondary | ICD-10-CM | POA: Diagnosis not present

## 2022-06-25 DIAGNOSIS — J449 Chronic obstructive pulmonary disease, unspecified: Secondary | ICD-10-CM | POA: Diagnosis not present

## 2022-06-25 DIAGNOSIS — I110 Hypertensive heart disease with heart failure: Secondary | ICD-10-CM | POA: Diagnosis not present

## 2022-07-03 DIAGNOSIS — Z7182 Exercise counseling: Secondary | ICD-10-CM | POA: Diagnosis not present

## 2022-07-03 DIAGNOSIS — E669 Obesity, unspecified: Secondary | ICD-10-CM | POA: Diagnosis not present

## 2022-07-03 DIAGNOSIS — Z6835 Body mass index (BMI) 35.0-35.9, adult: Secondary | ICD-10-CM | POA: Diagnosis not present

## 2022-07-06 NOTE — Patient Instructions (Signed)
SURGICAL WAITING ROOM VISITATION  Patients having surgery or a procedure may have no more than 2 support people in the waiting area - these visitors may rotate.    Children under the age of 62 must have an adult with them who is not the patient.  Due to an increase in RSV and influenza rates and associated hospitalizations, children ages 22 and under may not visit patients in [facility] hospitals.  If the patient needs to stay at the hospital during part of their recovery, the visitor guidelines for inpatient rooms apply. Pre-op nurse will coordinate an appropriate time for 1 support person to accompany patient in pre-op.  This support person may not rotate.    Please refer to the [facility] website for the visitor guidelines for Inpatients (after your surgery is over and you are in a regular room).       Your procedure is scheduled on:  07/17/22    Report to [facility] Main Entrance    Report to admitting at  [phone]   Call this number if you have problems the morning of surgery [phone]   Do not eat food :After Midnight.   After Midnight you may have the following liquids until _ 0430_____ AM DAY OF SURGERY  Water Non-Citrus Juices (without pulp, NO RED-Apple, White grape, White cranberry) Black Coffee (NO MILK/CREAM OR CREAMERS, sugar ok)  Clear Tea (NO MILK/CREAM OR CREAMERS, sugar ok) regular and decaf                             Plain Jell-O (NO RED)                                           Fruit ices (not with fruit pulp, NO RED)                                     Popsicles (NO RED)                                                               Sports drinks like Gatorade (NO RED)                    The day of surgery:  Drink ONE (1) Pre-Surgery Clear Ensure or G2 at  0430 AM ( have completed by )  the morning of surgery. Drink in one sitting. Do not sip.  This drink was given to you during your hospital  pre-op appointment visit. Nothing else to drink  after completing the  Pre-Surgery Clear Ensure or G2.          If you have questions, please contact your surgeon's office.       Oral Hygiene is also important to reduce your risk of infection.                                    Remember - BRUSH YOUR TEETH THE MORNING OF SURGERY WITH YOUR  REGULAR TOOTHPASTE  DENTURES WILL BE REMOVED PRIOR TO SURGERY PLEASE DO NOT APPLY "Poly grip" OR ADHESIVES!!!   Do NOT smoke after Midnight   Take these medicines the morning of surgery with A SIP OF WATER:  inhalers as usual and bring, mebulizer if needed, lexapro, gabapentin, lamictal   DO NOT TAKE ANY ORAL DIABETIC MEDICATIONS DAY OF YOUR SURGERY  Bring CPAP mask and tubing day of surgery.                              You may not have any metal on your body including hair pins, jewelry, and body piercing             Do not wear make-up, lotions, powders, perfumes/cologne, or deodorant  Do not wear nail polish including gel and S&S, artificial/acrylic nails, or any other type of covering on natural nails including finger and toenails. If you have artificial nails, gel coating, etc. that needs to be removed by a nail salon please have this removed prior to surgery or surgery may need to be canceled/ delayed if the surgeon/ anesthesia feels like they are unable to be safely monitored.   Do not shave  48 hours prior to surgery.               Men may shave face and neck.   Do not bring valuables to the hospital. [place] IS NOT             RESPONSIBLE   FOR VALUABLES.   Contacts, glasses, dentures or bridgework may not be worn into surgery.   Bring small overnight bag day of surgery.   DO NOT BRING YOUR HOME MEDICATIONS TO THE HOSPITAL. PHARMACY WILL DISPENSE MEDICATIONS LISTED ON YOUR MEDICATION LIST TO YOU DURING YOUR ADMISSION IN THE HOSPITAL!    Patients discharged on the day of surgery will not be allowed to drive home.  Someone NEEDS to stay with you for the first 24 hours after  anesthesia.   Special Instructions: Bring a copy of your healthcare power of attorney and living will documents the day of surgery if you haven't scanned them before.              Please read over the following fact sheets you were given: IF YOU HAVE QUESTIONS ABOUT YOUR PRE-OP INSTRUCTIONS PLEASE CALL [phone]   If you received a COVID test during your pre-op visit  it is requested that you wear a mask when out in public, stay away from anyone that may not be feeling well and notify your surgeon if you develop symptoms. If you test positive for Covid or have been in contact with anyone that has tested positive in the last 10 days please notify you surgeon.      Pre-operative 5 CHG Bath Instructions   You can play a key role in reducing the risk of infection after surgery. Your skin needs to be as free of germs as possible. You can reduce the number of germs on your skin by washing with CHG (chlorhexidine gluconate) soap before surgery. CHG is an antiseptic soap that kills germs and continues to kill germs even after washing.   DO NOT use if you have an allergy to chlorhexidine/CHG or antibacterial soaps. If your skin becomes reddened or irritated, stop using the CHG and notify one of our RNs at [phone]   Please shower with the CHG soap starting 4 days before  surgery using the following schedule:     Please keep in mind the following:  DO NOT shave, including legs and underarms, starting the day of your first shower.   You may shave your face at any point before/day of surgery.  Place clean sheets on your bed the day you start using CHG soap. Use a clean washcloth (not used since being washed) for each shower. DO NOT sleep with pets once you start using the CHG.   CHG Shower Instructions:  If you choose to wash your hair and private area, wash first with your normal shampoo/soap.  After you use shampoo/soap, rinse your hair and body thoroughly to remove shampoo/soap residue.   Turn the water OFF and apply about 3 tablespoons (45 ml) of CHG soap to a CLEAN washcloth.  Apply CHG soap ONLY FROM YOUR NECK DOWN TO YOUR TOES (washing for 3-5 minutes)  DO NOT use CHG soap on face, private areas, open wounds, or sores.  Pay special attention to the area where your surgery is being performed.  If you are having back surgery, having someone wash your back for you may be helpful. Wait 2 minutes after CHG soap is applied, then you may rinse off the CHG soap.  Pat dry with a clean towel  Put on clean clothes/pajamas   If you choose to wear lotion, please use ONLY the CHG-compatible lotions on the back of this paper.     Additional instructions for the day of surgery: DO NOT APPLY any lotions, deodorants, cologne, or perfumes.   Put on clean/comfortable clothes.  Brush your teeth.  Ask your nurse before applying any prescription medications to the skin.      CHG Compatible Lotions   Aveeno Moisturizing lotion  Cetaphil Moisturizing Cream  Cetaphil Moisturizing Lotion  Clairol Herbal Essence Moisturizing Lotion, Dry Skin  Clairol Herbal Essence Moisturizing Lotion, Extra Dry Skin  Clairol Herbal Essence Moisturizing Lotion, Normal Skin  Curel Age Defying Therapeutic Moisturizing Lotion with Alpha Hydroxy  Curel Extreme Care Body Lotion  Curel Soothing Hands Moisturizing Hand Lotion  Curel Therapeutic Moisturizing Cream, Fragrance-Free  Curel Therapeutic Moisturizing Lotion, Fragrance-Free  Curel Therapeutic Moisturizing Lotion, Original Formula  Eucerin Daily Replenishing Lotion  Eucerin Dry Skin Therapy Plus Alpha Hydroxy Crme  Eucerin Dry Skin Therapy Plus Alpha Hydroxy Lotion  Eucerin Original Crme  Eucerin Original Lotion  Eucerin Plus Crme Eucerin Plus Lotion  Eucerin TriLipid Replenishing Lotion  Keri Anti-Bacterial Hand Lotion  Keri Deep Conditioning Original Lotion Dry Skin Formula Softly Scented  Keri Deep Conditioning Original Lotion, Fragrance  Free Sensitive Skin Formula  Keri Lotion Fast Absorbing Fragrance Free Sensitive Skin Formula  Keri Lotion Fast Absorbing Softly Scented Dry Skin Formula  Keri Original Lotion  Keri Skin Renewal Lotion Keri Silky Smooth Lotion  Keri Silky Smooth Sensitive Skin Lotion  Nivea Body Creamy Conditioning Oil  Nivea Body Extra Enriched Teacher, adult education Moisturizing Lotion Nivea Crme  Nivea Skin Firming Lotion  NutraDerm 30 Skin Lotion  NutraDerm Skin Lotion  NutraDerm Therapeutic Skin Cream  NutraDerm Therapeutic Skin Lotion  ProShield Protective Hand Cream  Provon moisturizing lotion

## 2022-07-06 NOTE — Progress Notes (Signed)
Anesthesia Review:  PCP: Cardiologist :Agarwai LOV 06/24/22  Chest x-ray : EKG : Echo : 2020  Stress test: 2020  Cardiac Cath :  Activity level:  Sleep Study/ CPAP : Fasting Blood Sugar :      / Checks Blood Sugar -- times a day:   Blood Thinner/ Instructions /Last Dose: ASA / Instructions/ Last Dose :

## 2022-07-07 ENCOUNTER — Encounter (HOSPITAL_COMMUNITY): Payer: Self-pay

## 2022-07-07 ENCOUNTER — Encounter (HOSPITAL_COMMUNITY)
Admission: RE | Admit: 2022-07-07 | Discharge: 2022-07-07 | Disposition: A | Discharge status: Home / Self Care | Payer: Medicare Other | Source: Ambulatory Visit | Attending: Orthopedic Surgery | Admitting: Orthopedic Surgery

## 2022-07-07 ENCOUNTER — Other Ambulatory Visit: Payer: Self-pay

## 2022-07-07 VITALS — BP 111/73 | HR 86 | Temp 97.8°F | Resp 16 | Ht 70.0 in | Wt 250.0 lb

## 2022-07-07 DIAGNOSIS — Z01818 Encounter for other preprocedural examination: Secondary | ICD-10-CM | POA: Insufficient documentation

## 2022-07-07 DIAGNOSIS — Z79899 Other long term (current) drug therapy: Secondary | ICD-10-CM | POA: Diagnosis not present

## 2022-07-07 DIAGNOSIS — F419 Anxiety disorder, unspecified: Secondary | ICD-10-CM | POA: Diagnosis not present

## 2022-07-07 DIAGNOSIS — J449 Chronic obstructive pulmonary disease, unspecified: Secondary | ICD-10-CM | POA: Diagnosis not present

## 2022-07-07 DIAGNOSIS — F319 Bipolar disorder, unspecified: Secondary | ICD-10-CM | POA: Diagnosis not present

## 2022-07-07 DIAGNOSIS — K219 Gastro-esophageal reflux disease without esophagitis: Secondary | ICD-10-CM | POA: Insufficient documentation

## 2022-07-07 DIAGNOSIS — Z87891 Personal history of nicotine dependence: Secondary | ICD-10-CM | POA: Diagnosis not present

## 2022-07-07 DIAGNOSIS — I110 Hypertensive heart disease with heart failure: Secondary | ICD-10-CM | POA: Diagnosis not present

## 2022-07-07 DIAGNOSIS — G4730 Sleep apnea, unspecified: Secondary | ICD-10-CM | POA: Insufficient documentation

## 2022-07-07 DIAGNOSIS — I509 Heart failure, unspecified: Secondary | ICD-10-CM | POA: Insufficient documentation

## 2022-07-07 DIAGNOSIS — M1712 Unilateral primary osteoarthritis, left knee: Secondary | ICD-10-CM | POA: Diagnosis not present

## 2022-07-07 HISTORY — DX: Unspecified osteoarthritis, unspecified site: M19.90

## 2022-07-07 HISTORY — DX: Chronic obstructive pulmonary disease, unspecified: J44.9

## 2022-07-07 HISTORY — DX: Heart failure, unspecified: I50.9

## 2022-07-07 HISTORY — DX: Sleep apnea, unspecified: G47.30

## 2022-07-07 HISTORY — DX: Essential (primary) hypertension: I10

## 2022-07-07 LAB — CBC
HCT: 43 % (ref 39.0–52.0)
Hemoglobin: 14.3 g/dL (ref 13.0–17.0)
MCH: 31 pg (ref 26.0–34.0)
MCHC: 33.3 g/dL (ref 30.0–36.0)
MCV: 93.3 fL (ref 80.0–100.0)
Platelets: 230 10*3/uL (ref 150–400)
RBC: 4.61 MIL/uL (ref 4.22–5.81)
RDW: 12.1 % (ref 11.5–15.5)
WBC: 7.3 10*3/uL (ref 4.0–10.5)
nRBC: 0 % (ref 0.0–0.2)

## 2022-07-07 LAB — BASIC METABOLIC PANEL
Anion gap: 7 (ref 5–15)
BUN: 23 mg/dL (ref 8–23)
CO2: 24 mmol/L (ref 22–32)
Calcium: 9 mg/dL (ref 8.9–10.3)
Chloride: 103 mmol/L (ref 98–111)
Creatinine, Ser: 1.14 mg/dL (ref 0.61–1.24)
GFR, Estimated: >60 mL/min (ref >60)
Glucose, Bld: 137 mg/dL — ABNORMAL HIGH (ref 70–99)
Potassium: 4.6 mmol/L (ref 3.5–5.1)
Sodium: 134 mmol/L — ABNORMAL LOW (ref 135–145)

## 2022-07-07 LAB — SURGICAL PCR SCREEN
MRSA, PCR: NEGATIVE
Staphylococcus aureus: NEGATIVE

## 2022-07-09 NOTE — Progress Notes (Signed)
Anesthesia Chart Review   Case: [id number] Date/Time: 07/17/22 0715   Procedure: TOTAL KNEE ARTHROPLASTY (Left: Knee) - spinal and general   Anesthesia type: Spinal   Pre-op diagnosis: Osteoarthritis of left knee joint   Location: [facility] ROOM 06 / [facility]   Surgeons: Beverely Low, MD       DISCUSSION:67 y.o. former smoker with h/o GERD, COPD, HTN, sleep apnea, bipolar disorder, hepatic steatosis, HFpEF, left knee OA scheduled for above procedure 07/17/2022 with Dr. Beverely Low.   Pt last seen by cardiology 06/24/2022. Per OV note pt doing well, good BP control, euvolemic, no concerns at this visit. Per note, "He can proceed with left knee replacement as we previously discussed, and I'd be happy to provide CV risk assessment again if needed."  Clearance from PCP received which states pt is optimized for upcoming procedure.   Pt last seen by pulmonology 06/05/2022, stable at this visit.   Anticipate pt can proceed with planned procedure barring acute status change.   VS: BP 111/73   Pulse 86   Temp 36.6 C (Oral)   Resp 16   Ht 5\' 10"  (1.778 m)   Wt 113.4 kg   SpO2 97%   BMI 35.87 kg/m   PROVIDERS: Tisovec, Adelfa Koh, MD is PCP   Victorino December, MD is Pulmonologist   Curt Bears, MD  is Cardiologist  LABS: Labs reviewed: Acceptable for surgery. (all labs ordered are listed, but only abnormal results are displayed)  Labs Reviewed  BASIC METABOLIC PANEL - Abnormal; Notable for the following components:      Result Value   Sodium 134 (*)    Glucose, Bld 137 (*)    All other components within normal limits  SURGICAL PCR SCREEN  CBC     IMAGES:   EKG:   CV: Echocardiogram 08/16/2018:  Normal LV systolic function with EF 57%. Left ventricle cavity is normal  in size. Mild concentric hypertrophy of the left ventricle. Normal global  wall motion. Doppler evidence of grade II (pseudonormal) diastolic  dysfunction, elevated LAP. Calculated EF 57%.  Left atrial cavity  is severely dilated. LA vol index 54 cc/m2.  Right atrial cavity is mildly dilated.  Mild (Grade I) mitral regurgitation.  Mild tricuspid regurgitation. Estimated pulmonary artery systolic pressure  is 30 mmHg.   Lexiscan Myoview Stress Test 08/08/2018: Stress EKG is non-diagnostic, as this is pharmacological stress test. The perfusion imaging study demonstrates normal perfusion with no evidence of ischemia or scar.  LVEF was calculated at 62% with no regional wall motion abnormality. There is slight increased activity noted in the retrosternal area and may represent a hiatal hernia, however abnormal uptake may need further workup if clinically indicated and patient is at high risk for malignancy. Low risk cardiac stress study. Past Medical History:  Diagnosis Date   ADD (attention deficit disorder)    Anxiety    Panic   Arthritis    Bipolar disorder (HCC)    CHF (congestive heart failure) (HCC)    COPD (chronic obstructive pulmonary disease) (HCC)    Depression    GERD (gastroesophageal reflux disease)    ? they thought GERD caused cough.    Hypertension    Mental disorder    ADD, bipolar   Pneumonia 04/12/2013   Shortness of breath    Sleep apnea    cpap   Substance abuse [facility])     Past Surgical History:  Procedure Laterality Date   APPENDECTOMY  1988   COLONOSCOPY  TONSILLECTOMY     VIDEO BRONCHOSCOPY Bilateral 10/10/2013   Procedure: VIDEO BRONCHOSCOPY WITH FLUORO;  Surgeon: Oretha Milch, MD;  Location: [facility] ENDOSCOPY;  Service: Cardiopulmonary;  Laterality: Bilateral;   VIDEO BRONCHOSCOPY N/A 10/13/2013   Procedure: VIDEO BRONCHOSCOPY, REMOVAL OF ENDOBRONCHIAL LESION, PROBABLE FOREIGN BODY;  Surgeon: Delight Ovens, MD;  Location: [facility] OR;  Service: Thoracic;  Laterality: N/A;   VIDEO BRONCHOSCOPY N/A 11/28/2013   Procedure: VIDEO BRONCHOSCOPY;  Surgeon: Delight Ovens, MD;  Location: [facility] OR;  Service: Thoracic;  Laterality: N/A;  bronchoscopy for removal of foreign  body   VIDEO BRONCHOSCOPY Bilateral 03/10/2017   Procedure: VIDEO BRONCHOSCOPY WITH FLUORO;  Surgeon: Oretha Milch, MD;  Location: [facility] ENDOSCOPY;  Service: Cardiopulmonary;  Laterality: Bilateral;    MEDICATIONS:  albuterol (PROAIR HFA) 108 (90 Base) MCG/ACT inhaler   albuterol (PROVENTIL) (2.5 MG/3ML) 0.083% nebulizer solution   atorvastatin (LIPITOR) 10 MG tablet   chlorpheniramine-HYDROcodone (TUSSIONEX PENNKINETIC ER) 10-8 MG/5ML   escitalopram (LEXAPRO) 10 MG tablet   furosemide (LASIX) 40 MG tablet   gabapentin (NEURONTIN) 300 MG capsule   gabapentin (NEURONTIN) 800 MG tablet   hydrOXYzine (VISTARIL) 25 MG capsule   lamoTRIgine (LAMICTAL) 100 MG tablet   losartan (COZAAR) 100 MG tablet   Melatonin 5 MG TABS   Multiple Vitamin (MULTIVITAMIN WITH MINERALS) TABS tablet   nitroGLYCERIN (NITROSTAT) 0.4 MG SL tablet   predniSONE (DELTASONE) 20 MG tablet   spironolactone (ALDACTONE) 25 MG tablet   telmisartan (MICARDIS) 20 MG tablet   traMADol (ULTRAM) 50 MG tablet   traZODone (DESYREL) 100 MG tablet   No current facility-administered medications for this encounter.    Jodell Cipro Ward, PA-C [facility] Pre-Surgical Testing [phone]

## 2022-07-09 NOTE — Anesthesia Preprocedure Evaluation (Addendum)
Anesthesia Evaluation  Patient identified by MRN, date of birth, ID band Patient awake    Reviewed: Allergy & Precautions, NPO status , Patient's Chart, lab work & pertinent test results  History of Anesthesia Complications Negative for: history of anesthetic complications  Airway Mallampati: III  TM Distance: >3 FB Neck ROM: Full    Dental  (+) Dental Advisory Given, Teeth Intact   Pulmonary sleep apnea and Continuous Positive Airway Pressure Ventilation , COPD,  COPD inhaler, former smoker   Pulmonary exam normal        Cardiovascular hypertension, Pt. on medications +CHF  Normal cardiovascular exam   '20 TTE - EF 57%. Mild concentric hypertrophy of the left ventricle. G2DD. Left atrial cavity is severely dilated. LA vol index 54 cc/m2. Right atrial cavity is mildly dilated. Mild (Grade I) mitral regurgitation. Mild tricuspid regurgitation.    Neuro/Psych  PSYCHIATRIC DISORDERS Anxiety Depression Bipolar Disorder   negative neurological ROS     GI/Hepatic Neg liver ROS,GERD  Controlled,,  Endo/Other    Morbid obesity  Renal/GU negative Renal ROS     Musculoskeletal  (+) Arthritis ,    Abdominal   Peds  Hematology negative hematology ROS (+)   Anesthesia Other Findings   Reproductive/Obstetrics                             Anesthesia Physical Anesthesia Plan  ASA: 3  Anesthesia Plan: Spinal   Post-op Pain Management: Regional block* and Tylenol PO (pre-op)*   Induction:   PONV Risk Score and Plan: 1 and Treatment may vary due to age or medical condition and Propofol infusion  Airway Management Planned: Natural Airway and Simple Face Mask  Additional Equipment: None  Intra-op Plan:   Post-operative Plan:   Informed Consent: I have reviewed the patients History and Physical, chart, labs and discussed the procedure including the risks, benefits and alternatives for the  proposed anesthesia with the patient or authorized representative who has indicated his/her understanding and acceptance.       Plan Discussed with: CRNA and Anesthesiologist  Anesthesia Plan Comments: (See PAT note )       Anesthesia Quick Evaluation

## 2022-07-17 ENCOUNTER — Encounter (HOSPITAL_COMMUNITY): Payer: Self-pay | Admitting: Orthopedic Surgery

## 2022-07-17 ENCOUNTER — Inpatient Hospital Stay (HOSPITAL_COMMUNITY)
Admission: AD | Admit: 2022-07-17 | Discharge: 2022-07-20 | DRG: 470 | Disposition: A | Discharge status: Home / Self Care | Payer: Medicare Other | Attending: Orthopedic Surgery | Admitting: Orthopedic Surgery

## 2022-07-17 ENCOUNTER — Encounter (HOSPITAL_COMMUNITY)
Admission: AD | Disposition: A | Discharge status: Home / Self Care | Payer: Self-pay | Source: Home / Self Care | Attending: Orthopedic Surgery

## 2022-07-17 ENCOUNTER — Other Ambulatory Visit: Payer: Self-pay

## 2022-07-17 ENCOUNTER — Ambulatory Visit (HOSPITAL_COMMUNITY): Payer: Medicare Other | Admitting: Physician Assistant

## 2022-07-17 ENCOUNTER — Ambulatory Visit (HOSPITAL_BASED_OUTPATIENT_CLINIC_OR_DEPARTMENT_OTHER): Payer: Medicare Other | Admitting: Anesthesiology

## 2022-07-17 DIAGNOSIS — G8918 Other acute postprocedural pain: Secondary | ICD-10-CM | POA: Diagnosis not present

## 2022-07-17 DIAGNOSIS — J449 Chronic obstructive pulmonary disease, unspecified: Secondary | ICD-10-CM | POA: Diagnosis present

## 2022-07-17 DIAGNOSIS — F419 Anxiety disorder, unspecified: Secondary | ICD-10-CM | POA: Diagnosis not present

## 2022-07-17 DIAGNOSIS — M1712 Unilateral primary osteoarthritis, left knee: Secondary | ICD-10-CM | POA: Diagnosis not present

## 2022-07-17 DIAGNOSIS — Z8701 Personal history of pneumonia (recurrent): Secondary | ICD-10-CM | POA: Diagnosis not present

## 2022-07-17 DIAGNOSIS — Z7952 Long term (current) use of systemic steroids: Secondary | ICD-10-CM

## 2022-07-17 DIAGNOSIS — F900 Attention-deficit hyperactivity disorder, predominantly inattentive type: Secondary | ICD-10-CM | POA: Diagnosis not present

## 2022-07-17 DIAGNOSIS — I110 Hypertensive heart disease with heart failure: Secondary | ICD-10-CM

## 2022-07-17 DIAGNOSIS — Z01818 Encounter for other preprocedural examination: Secondary | ICD-10-CM

## 2022-07-17 DIAGNOSIS — Z87891 Personal history of nicotine dependence: Secondary | ICD-10-CM | POA: Diagnosis not present

## 2022-07-17 DIAGNOSIS — Z79890 Hormone replacement therapy (postmenopausal): Secondary | ICD-10-CM

## 2022-07-17 DIAGNOSIS — M25762 Osteophyte, left knee: Secondary | ICD-10-CM | POA: Diagnosis present

## 2022-07-17 DIAGNOSIS — Z79899 Other long term (current) drug therapy: Secondary | ICD-10-CM

## 2022-07-17 DIAGNOSIS — Z888 Allergy status to other drugs, medicaments and biological substances status: Secondary | ICD-10-CM | POA: Diagnosis not present

## 2022-07-17 DIAGNOSIS — I5042 Chronic combined systolic (congestive) and diastolic (congestive) heart failure: Secondary | ICD-10-CM | POA: Diagnosis not present

## 2022-07-17 DIAGNOSIS — Z6838 Body mass index (BMI) 38.0-38.9, adult: Secondary | ICD-10-CM

## 2022-07-17 DIAGNOSIS — G4730 Sleep apnea, unspecified: Secondary | ICD-10-CM | POA: Diagnosis present

## 2022-07-17 DIAGNOSIS — K219 Gastro-esophageal reflux disease without esophagitis: Secondary | ICD-10-CM | POA: Diagnosis present

## 2022-07-17 DIAGNOSIS — F319 Bipolar disorder, unspecified: Secondary | ICD-10-CM | POA: Diagnosis present

## 2022-07-17 DIAGNOSIS — Z96652 Presence of left artificial knee joint: Principal | ICD-10-CM

## 2022-07-17 DIAGNOSIS — I509 Heart failure, unspecified: Secondary | ICD-10-CM

## 2022-07-17 HISTORY — PX: TOTAL KNEE ARTHROPLASTY: SHX125

## 2022-07-17 SURGERY — ARTHROPLASTY, KNEE, TOTAL
Anesthesia: Spinal | Site: Knee | Laterality: Left

## 2022-07-17 MED ORDER — SPIRONOLACTONE 25 MG PO TABS
25.0000 mg | ORAL_TABLET | Freq: Every morning | ORAL | Status: DC
  Administered 2022-07-18 – 2022-07-20 (×3): 25 mg via ORAL
  Filled 2022-07-17 (×3): qty 1

## 2022-07-17 MED ORDER — PHENOL 1.4 % MT LIQD: 1.0000 | OROMUCOSAL | Status: DC | PRN

## 2022-07-17 MED ORDER — SODIUM CHLORIDE (PF) 0.9 % IJ SOLN
INTRAMUSCULAR | Status: DC | PRN
  Administered 2022-07-17: 80 mL

## 2022-07-17 MED ORDER — 0.9 % SODIUM CHLORIDE (POUR BTL) OPTIME
TOPICAL | Status: DC | PRN
  Administered 2022-07-17: 1000 mL

## 2022-07-17 MED ORDER — METHOCARBAMOL 500 MG PO TABS
500.0000 mg | ORAL_TABLET | Freq: Four times a day (QID) | ORAL | Status: DC | PRN
  Administered 2022-07-17 – 2022-07-20 (×8): 500 mg via ORAL
  Filled 2022-07-17 (×8): qty 1

## 2022-07-17 MED ORDER — GABAPENTIN 300 MG PO CAPS: 600.0000 mg | ORAL_CAPSULE | Freq: Three times a day (TID) | ORAL | Status: DC

## 2022-07-17 MED ORDER — METHOCARBAMOL 1000 MG/10ML IJ SOLN: 500.0000 mg | Freq: Four times a day (QID) | INTRAVENOUS | Status: DC | PRN

## 2022-07-17 MED ORDER — SODIUM CHLORIDE 0.9 % IV SOLN: 500.0000 mL | Freq: Once | INTRAVENOUS | Status: DC

## 2022-07-17 MED ORDER — ALBUTEROL SULFATE (2.5 MG/3ML) 0.083% IN NEBU: 2.5000 mg | INHALATION_SOLUTION | Freq: Four times a day (QID) | RESPIRATORY_TRACT | Status: DC | PRN

## 2022-07-17 MED ORDER — BUPIVACAINE HCL (PF) 0.25 % IJ SOLN
INTRAMUSCULAR | Status: AC
  Filled 2022-07-17: qty 30

## 2022-07-17 MED ORDER — PROPOFOL 500 MG/50ML IV EMUL
INTRAVENOUS | Status: DC | PRN
  Administered 2022-07-17: 75 ug/kg/min via INTRAVENOUS

## 2022-07-17 MED ORDER — ORAL CARE MOUTH RINSE: 15.0000 mL | Freq: Once | OROMUCOSAL | Status: AC

## 2022-07-17 MED ORDER — EPINEPHRINE PF 1 MG/ML IJ SOLN
INTRAMUSCULAR | Status: AC
  Filled 2022-07-17: qty 1

## 2022-07-17 MED ORDER — LACTATED RINGERS IV SOLN: INTRAVENOUS | Status: DC

## 2022-07-17 MED ORDER — ACETAMINOPHEN 500 MG PO TABS
1000.0000 mg | ORAL_TABLET | Freq: Once | ORAL | Status: AC
  Administered 2022-07-17: 1000 mg via ORAL
  Filled 2022-07-17: qty 2

## 2022-07-17 MED ORDER — DEXAMETHASONE SODIUM PHOSPHATE 10 MG/ML IJ SOLN
INTRAMUSCULAR | Status: DC | PRN
  Administered 2022-07-17: 10 mg via INTRAVENOUS

## 2022-07-17 MED ORDER — BISACODYL 10 MG RE SUPP
10.0000 mg | Freq: Every day | RECTAL | Status: DC | PRN
  Administered 2022-07-20: 10 mg via RECTAL
  Filled 2022-07-17: qty 1

## 2022-07-17 MED ORDER — ALBUMIN HUMAN 5 % IV SOLN
12.5000 g | Freq: Once | INTRAVENOUS | Status: AC
  Administered 2022-07-17: 12.5 g via INTRAVENOUS

## 2022-07-17 MED ORDER — ONDANSETRON HCL 4 MG PO TABS: 4.0000 mg | ORAL_TABLET | Freq: Three times a day (TID) | ORAL | 1 refills | Status: AC | PRN

## 2022-07-17 MED ORDER — SODIUM CHLORIDE 0.9 % IR SOLN
Status: DC | PRN
  Administered 2022-07-17: 1000 mL

## 2022-07-17 MED ORDER — OXYCODONE HCL 5 MG PO TABS: 5.0000 mg | ORAL_TABLET | ORAL | 0 refills | Status: AC | PRN

## 2022-07-17 MED ORDER — OXYCODONE HCL 5 MG PO TABS: 5.0000 mg | ORAL_TABLET | Freq: Once | ORAL | Status: DC | PRN

## 2022-07-17 MED ORDER — OXYCODONE HCL 5 MG PO TABS
10.0000 mg | ORAL_TABLET | ORAL | Status: DC | PRN
  Administered 2022-07-17: 10 mg via ORAL
  Administered 2022-07-17 – 2022-07-20 (×6): 15 mg via ORAL
  Filled 2022-07-17 (×3): qty 3
  Filled 2022-07-17: qty 2
  Filled 2022-07-17 (×3): qty 3

## 2022-07-17 MED ORDER — ADULT MULTIVITAMIN W/MINERALS CH
1.0000 | ORAL_TABLET | Freq: Every day | ORAL | Status: DC
  Administered 2022-07-18 – 2022-07-20 (×3): 1 via ORAL
  Filled 2022-07-17 (×3): qty 1

## 2022-07-17 MED ORDER — CEFAZOLIN SODIUM-DEXTROSE 2-4 GM/100ML-% IV SOLN
2.0000 g | INTRAVENOUS | Status: AC
  Administered 2022-07-17: 2 g via INTRAVENOUS
  Filled 2022-07-17: qty 100

## 2022-07-17 MED ORDER — TRANEXAMIC ACID-NACL 1000-0.7 MG/100ML-% IV SOLN
1000.0000 mg | INTRAVENOUS | Status: AC
  Administered 2022-07-17: 1000 mg via INTRAVENOUS
  Filled 2022-07-17: qty 100

## 2022-07-17 MED ORDER — FUROSEMIDE 40 MG PO TABS: 40.0000 mg | ORAL_TABLET | Freq: Every day | ORAL | Status: DC | PRN

## 2022-07-17 MED ORDER — TRAZODONE HCL 100 MG PO TABS
100.0000 mg | ORAL_TABLET | Freq: Every evening | ORAL | Status: DC | PRN
  Administered 2022-07-17 – 2022-07-19 (×3): 100 mg via ORAL
  Filled 2022-07-17 (×3): qty 1

## 2022-07-17 MED ORDER — MIDAZOLAM HCL 2 MG/2ML IJ SOLN
INTRAMUSCULAR | Status: AC
  Filled 2022-07-17: qty 2

## 2022-07-17 MED ORDER — WATER FOR IRRIGATION, STERILE IR SOLN
Status: DC | PRN
  Administered 2022-07-17: 2000 mL

## 2022-07-17 MED ORDER — HYDROMORPHONE HCL 1 MG/ML IJ SOLN
0.5000 mg | INTRAMUSCULAR | Status: DC | PRN
  Administered 2022-07-17 – 2022-07-18 (×3): 1 mg via INTRAVENOUS
  Filled 2022-07-17 (×3): qty 1

## 2022-07-17 MED ORDER — BUPIVACAINE IN DEXTROSE 0.75-8.25 % IT SOLN
INTRATHECAL | Status: DC | PRN
  Administered 2022-07-17: 1.8 mL via INTRATHECAL

## 2022-07-17 MED ORDER — ONDANSETRON HCL 4 MG/2ML IJ SOLN: 4.0000 mg | Freq: Once | INTRAMUSCULAR | Status: DC | PRN

## 2022-07-17 MED ORDER — ONDANSETRON HCL 4 MG/2ML IJ SOLN: 4.0000 mg | Freq: Four times a day (QID) | INTRAMUSCULAR | Status: DC | PRN

## 2022-07-17 MED ORDER — IRBESARTAN 75 MG PO TABS
75.0000 mg | ORAL_TABLET | Freq: Every day | ORAL | Status: DC
  Administered 2022-07-18 – 2022-07-20 (×3): 75 mg via ORAL
  Filled 2022-07-17 (×3): qty 1

## 2022-07-17 MED ORDER — OXYCODONE HCL 5 MG PO TABS
5.0000 mg | ORAL_TABLET | ORAL | Status: DC | PRN
  Administered 2022-07-19: 5 mg via ORAL
  Administered 2022-07-19: 10 mg via ORAL
  Filled 2022-07-17 (×4): qty 2

## 2022-07-17 MED ORDER — POLYETHYLENE GLYCOL 3350 17 G PO PACK
17.0000 g | PACK | Freq: Every day | ORAL | Status: DC | PRN
  Administered 2022-07-18 – 2022-07-20 (×2): 17 g via ORAL
  Filled 2022-07-17 (×2): qty 1

## 2022-07-17 MED ORDER — BUPIVACAINE LIPOSOME 1.3 % IJ SUSP: 20.0000 mL | Freq: Once | INTRAMUSCULAR | Status: DC

## 2022-07-17 MED ORDER — METOCLOPRAMIDE HCL 5 MG PO TABS: 5.0000 mg | ORAL_TABLET | Freq: Three times a day (TID) | ORAL | Status: DC | PRN

## 2022-07-17 MED ORDER — EPHEDRINE SULFATE (PRESSORS) 50 MG/ML IJ SOLN
5.0000 mg | Freq: Once | INTRAMUSCULAR | Status: AC
  Administered 2022-07-17: 5 mg via INTRAVENOUS

## 2022-07-17 MED ORDER — DOCUSATE SODIUM 100 MG PO CAPS
100.0000 mg | ORAL_CAPSULE | Freq: Two times a day (BID) | ORAL | Status: DC
  Administered 2022-07-17 – 2022-07-20 (×7): 100 mg via ORAL
  Filled 2022-07-17 (×7): qty 1

## 2022-07-17 MED ORDER — PHENYLEPHRINE HCL (PRESSORS) 10 MG/ML IV SOLN
INTRAVENOUS | Status: AC
  Filled 2022-07-17: qty 1

## 2022-07-17 MED ORDER — ALBUMIN HUMAN 5 % IV SOLN
INTRAVENOUS | Status: AC
  Filled 2022-07-17: qty 250

## 2022-07-17 MED ORDER — ONDANSETRON HCL 4 MG/2ML IJ SOLN
INTRAMUSCULAR | Status: DC | PRN
  Administered 2022-07-17: 4 mg via INTRAVENOUS

## 2022-07-17 MED ORDER — TRAMADOL HCL 50 MG PO TABS
100.0000 mg | ORAL_TABLET | Freq: Four times a day (QID) | ORAL | Status: DC | PRN
  Administered 2022-07-17 – 2022-07-20 (×5): 100 mg via ORAL
  Filled 2022-07-17 (×6): qty 2

## 2022-07-17 MED ORDER — EPHEDRINE SULFATE (PRESSORS) 50 MG/ML IJ SOLN
INTRAMUSCULAR | Status: AC
  Filled 2022-07-17: qty 1

## 2022-07-17 MED ORDER — LAMOTRIGINE 100 MG PO TABS
100.0000 mg | ORAL_TABLET | Freq: Two times a day (BID) | ORAL | Status: DC
  Administered 2022-07-17 – 2022-07-20 (×6): 100 mg via ORAL
  Filled 2022-07-17 (×6): qty 1

## 2022-07-17 MED ORDER — PREDNISONE 20 MG PO TABS: 40.0000 mg | ORAL_TABLET | Freq: Every day | ORAL | Status: DC

## 2022-07-17 MED ORDER — BUPIVACAINE LIPOSOME 1.3 % IJ SUSP
INTRAMUSCULAR | Status: AC
  Filled 2022-07-17: qty 20

## 2022-07-17 MED ORDER — ASPIRIN 81 MG PO CHEW
81.0000 mg | CHEWABLE_TABLET | Freq: Two times a day (BID) | ORAL | Status: DC
  Administered 2022-07-17 – 2022-07-20 (×6): 81 mg via ORAL
  Filled 2022-07-17 (×6): qty 1

## 2022-07-17 MED ORDER — METHOCARBAMOL 500 MG PO TABS: 500.0000 mg | ORAL_TABLET | Freq: Three times a day (TID) | ORAL | 1 refills | Status: AC | PRN

## 2022-07-17 MED ORDER — TRANEXAMIC ACID-NACL 1000-0.7 MG/100ML-% IV SOLN
1000.0000 mg | Freq: Once | INTRAVENOUS | Status: AC
  Administered 2022-07-17: 1000 mg via INTRAVENOUS
  Filled 2022-07-17: qty 100

## 2022-07-17 MED ORDER — OXYCODONE HCL 5 MG/5ML PO SOLN: 5.0000 mg | Freq: Once | ORAL | Status: DC | PRN

## 2022-07-17 MED ORDER — CEFAZOLIN SODIUM-DEXTROSE 2-4 GM/100ML-% IV SOLN
2.0000 g | Freq: Four times a day (QID) | INTRAVENOUS | Status: AC
  Administered 2022-07-17 (×2): 2 g via INTRAVENOUS
  Filled 2022-07-17 (×2): qty 100

## 2022-07-17 MED ORDER — POVIDONE-IODINE 10 % EX SWAB: 2.0000 | Freq: Once | CUTANEOUS | Status: DC

## 2022-07-17 MED ORDER — SODIUM CHLORIDE (PF) 0.9 % IJ SOLN
INTRAMUSCULAR | Status: AC
  Filled 2022-07-17: qty 30

## 2022-07-17 MED ORDER — ACETAMINOPHEN 325 MG PO TABS
325.0000 mg | ORAL_TABLET | Freq: Four times a day (QID) | ORAL | Status: DC | PRN
  Administered 2022-07-17: 650 mg via ORAL
  Filled 2022-07-17: qty 2

## 2022-07-17 MED ORDER — EPHEDRINE SULFATE-NACL 50-0.9 MG/10ML-% IV SOSY
PREFILLED_SYRINGE | INTRAVENOUS | Status: DC | PRN
  Administered 2022-07-17: 5 mg via INTRAVENOUS

## 2022-07-17 MED ORDER — CHLORHEXIDINE GLUCONATE 0.12 % MT SOLN
15.0000 mL | Freq: Once | OROMUCOSAL | Status: AC
  Administered 2022-07-17: 15 mL via OROMUCOSAL

## 2022-07-17 MED ORDER — MIDAZOLAM HCL 2 MG/2ML IJ SOLN
2.0000 mg | Freq: Once | INTRAMUSCULAR | Status: AC
  Administered 2022-07-17: 2 mg via INTRAVENOUS

## 2022-07-17 MED ORDER — ROPIVACAINE HCL 7.5 MG/ML IJ SOLN
INTRAMUSCULAR | Status: DC | PRN
  Administered 2022-07-17: 20 mL via PERINEURAL

## 2022-07-17 MED ORDER — FENTANYL CITRATE (PF) 100 MCG/2ML IJ SOLN
INTRAMUSCULAR | Status: AC
  Filled 2022-07-17: qty 2

## 2022-07-17 MED ORDER — FENTANYL CITRATE (PF) 100 MCG/2ML IJ SOLN
INTRAMUSCULAR | Status: DC | PRN
  Administered 2022-07-17: 50 ug via INTRAVENOUS

## 2022-07-17 MED ORDER — SODIUM CHLORIDE 0.9 % IV SOLN: INTRAVENOUS | Status: DC

## 2022-07-17 MED ORDER — ALBUTEROL SULFATE (2.5 MG/3ML) 0.083% IN NEBU: 3.0000 mL | INHALATION_SOLUTION | Freq: Four times a day (QID) | RESPIRATORY_TRACT | Status: DC | PRN

## 2022-07-17 MED ORDER — MENTHOL 3 MG MT LOZG: 1.0000 | LOZENGE | OROMUCOSAL | Status: DC | PRN

## 2022-07-17 MED ORDER — HYDROCOD POLI-CHLORPHE POLI ER 10-8 MG/5ML PO SUER: 5.0000 mL | Freq: Two times a day (BID) | ORAL | Status: DC | PRN

## 2022-07-17 MED ORDER — PHENYLEPHRINE HCL-NACL 20-0.9 MG/250ML-% IV SOLN
INTRAVENOUS | Status: DC | PRN
  Administered 2022-07-17: 25 ug/min via INTRAVENOUS

## 2022-07-17 MED ORDER — FENTANYL CITRATE PF 50 MCG/ML IJ SOSY: 25.0000 ug | PREFILLED_SYRINGE | INTRAMUSCULAR | Status: DC | PRN

## 2022-07-17 MED ORDER — LOSARTAN POTASSIUM 50 MG PO TABS: 100.0000 mg | ORAL_TABLET | Freq: Every day | ORAL | Status: DC

## 2022-07-17 MED ORDER — METOCLOPRAMIDE HCL 5 MG/ML IJ SOLN
5.0000 mg | Freq: Three times a day (TID) | INTRAMUSCULAR | Status: DC | PRN
  Administered 2022-07-18: 10 mg via INTRAVENOUS
  Filled 2022-07-17: qty 2

## 2022-07-17 MED ORDER — GABAPENTIN 400 MG PO CAPS
800.0000 mg | ORAL_CAPSULE | Freq: Three times a day (TID) | ORAL | Status: DC
  Administered 2022-07-17 – 2022-07-20 (×9): 800 mg via ORAL
  Filled 2022-07-17 (×9): qty 2

## 2022-07-17 MED ORDER — ESCITALOPRAM OXALATE 10 MG PO TABS
10.0000 mg | ORAL_TABLET | Freq: Every day | ORAL | Status: DC
  Administered 2022-07-17 – 2022-07-20 (×4): 10 mg via ORAL
  Filled 2022-07-17 (×4): qty 1

## 2022-07-17 MED ORDER — ONDANSETRON HCL 4 MG PO TABS
4.0000 mg | ORAL_TABLET | Freq: Four times a day (QID) | ORAL | Status: DC | PRN
  Administered 2022-07-18 – 2022-07-19 (×2): 4 mg via ORAL
  Filled 2022-07-17 (×2): qty 1

## 2022-07-17 MED ORDER — HYDROXYZINE HCL 25 MG PO TABS
25.0000 mg | ORAL_TABLET | Freq: Every day | ORAL | Status: DC
  Administered 2022-07-17 – 2022-07-20 (×4): 25 mg via ORAL
  Filled 2022-07-17 (×4): qty 1

## 2022-07-17 MED ORDER — ATORVASTATIN CALCIUM 10 MG PO TABS
10.0000 mg | ORAL_TABLET | Freq: Every day | ORAL | Status: DC
  Administered 2022-07-17 – 2022-07-20 (×4): 10 mg via ORAL
  Filled 2022-07-17 (×4): qty 1

## 2022-07-17 MED ORDER — ASPIRIN 81 MG PO CHEW: 81.0000 mg | CHEWABLE_TABLET | Freq: Two times a day (BID) | ORAL | 0 refills | Status: AC

## 2022-07-17 MED ORDER — NITROGLYCERIN 0.4 MG SL SUBL: 0.4000 mg | SUBLINGUAL_TABLET | SUBLINGUAL | Status: DC | PRN

## 2022-07-17 SURGICAL SUPPLY — 59 items
ATTUNE MED DOME PAT 41 KNEE (Knees) IMPLANT
ATTUNE PS FEM LT SZ 8 CEM KNEE (Femur) IMPLANT
ATTUNE PSRP INSR SZ8 10 KNEE (Insert) IMPLANT
BAG COUNTER SPONGE SURGICOUNT (BAG) IMPLANT
BAG SPEC THK2 15X12 ZIP CLS (MISCELLANEOUS)
BAG SPNG CNTER NS LX DISP (BAG)
BAG ZIPLOCK 12X15 (MISCELLANEOUS) IMPLANT
BASE TIBIAL ROT PLAT SZ 8 KNEE (Knees) IMPLANT
BLADE SAG 18X100X1.27 (BLADE) ×1 IMPLANT
BLADE SAW SGTL 13X75X1.27 (BLADE) ×1 IMPLANT
BNDG CMPR MED 10X6 ELC LF (GAUZE/BANDAGES/DRESSINGS) ×1
BNDG ELASTIC 6X10 VLCR STRL LF (GAUZE/BANDAGES/DRESSINGS) ×1 IMPLANT
BNDG GAUZE DERMACEA FLUFF 4 (GAUZE/BANDAGES/DRESSINGS) ×1 IMPLANT
BNDG GZE 12X3 1 PLY HI ABS (GAUZE/BANDAGES/DRESSINGS) ×1
BNDG GZE DERMACEA 4 6PLY (GAUZE/BANDAGES/DRESSINGS) ×1
BNDG STRETCH GAUZE 3IN X12FT (GAUZE/BANDAGES/DRESSINGS) IMPLANT
BOWL SMART MIX CTS (DISPOSABLE) ×1 IMPLANT
BSPLAT TIB 8 CMNT ROT PLAT STR (Knees) ×1 IMPLANT
CEMENT HV SMART SET (Cement) ×2 IMPLANT
COVER SURGICAL LIGHT HANDLE (MISCELLANEOUS) ×1 IMPLANT
CUFF TOURN SGL QUICK 34 (TOURNIQUET CUFF) ×1
CUFF TRNQT CYL 34X4.125X (TOURNIQUET CUFF) ×1 IMPLANT
DRAPE INCISE IOBAN 66X45 STRL (DRAPES) ×1 IMPLANT
DRAPE SHEET LG 3/4 BI-LAMINATE (DRAPES) ×1 IMPLANT
DRAPE U-SHAPE 47X51 STRL (DRAPES) ×1 IMPLANT
DRSG ADAPTIC 3X8 NADH LF (GAUZE/BANDAGES/DRESSINGS) ×1 IMPLANT
DURAPREP 26ML APPLICATOR (WOUND CARE) ×1 IMPLANT
ELECT REM PT RETURN 15FT ADLT (MISCELLANEOUS) ×1 IMPLANT
GAUZE PAD ABD 8X10 STRL (GAUZE/BANDAGES/DRESSINGS) ×1 IMPLANT
GAUZE SPONGE 4X4 12PLY STRL (GAUZE/BANDAGES/DRESSINGS) ×1 IMPLANT
GLOVE BIOGEL PI IND STRL 7.5 (GLOVE) ×1 IMPLANT
GLOVE BIOGEL PI IND STRL 8.5 (GLOVE) ×1 IMPLANT
GLOVE ORTHO TXT STRL SZ7.5 (GLOVE) ×1 IMPLANT
GLOVE SURG ORTHO 8.5 STRL (GLOVE) ×2 IMPLANT
GOWN STRL REUS W/ TWL XL LVL3 (GOWN DISPOSABLE) ×2 IMPLANT
GOWN STRL REUS W/TWL XL LVL3 (GOWN DISPOSABLE) ×2
HANDPIECE INTERPULSE COAX TIP (DISPOSABLE) ×1
HOLDER FOLEY CATH W/STRAP (MISCELLANEOUS) IMPLANT
IMMOBILIZER KNEE 20 (SOFTGOODS) ×1
IMMOBILIZER KNEE 20 THIGH 36 (SOFTGOODS) IMPLANT
KIT TURNOVER KIT A (KITS) IMPLANT
MANIFOLD NEPTUNE II (INSTRUMENTS) ×1 IMPLANT
NS IRRIG 1000ML POUR BTL (IV SOLUTION) ×1 IMPLANT
PACK TOTAL KNEE CUSTOM (KITS) ×1 IMPLANT
PIN STEINMAN FIXATION KNEE (PIN) IMPLANT
PROTECTOR NERVE ULNAR (MISCELLANEOUS) ×1 IMPLANT
SET HNDPC FAN SPRY TIP SCT (DISPOSABLE) ×1 IMPLANT
STAPLER VISISTAT 35W (STAPLE) IMPLANT
STRIP CLOSURE SKIN 1/2X4 (GAUZE/BANDAGES/DRESSINGS) ×2 IMPLANT
SUT MNCRL AB 3-0 PS2 18 (SUTURE) ×1 IMPLANT
SUT VIC AB 0 CT1 36 (SUTURE) ×1 IMPLANT
SUT VIC AB 1 CT1 36 (SUTURE) ×2 IMPLANT
SUT VIC AB 2-0 CT1 27 (SUTURE) ×1
SUT VIC AB 2-0 CT1 TAPERPNT 27 (SUTURE) ×1 IMPLANT
TAPE STRIPS DRAPE STRL (GAUZE/BANDAGES/DRESSINGS) IMPLANT
TIBIAL BASE ROT PLAT SZ 8 KNEE (Knees) ×1 IMPLANT
TRAY CATH INTERMITTENT SS 16FR (CATHETERS) ×1 IMPLANT
WATER STERILE IRR 1000ML POUR (IV SOLUTION) ×2 IMPLANT
YANKAUER SUCT BULB TIP NO VENT (SUCTIONS) ×1 IMPLANT

## 2022-07-17 NOTE — Discharge Instructions (Signed)
Ice to the knee constantly.  Keep the incision covered and clean and dry for one week, then ok to get it wet in the shower.  Do exercise as instructed every hour, please to prevent stiffness.    DO NOT prop anything under the knee, it will make your knee stiff.  Prop under the ankle to encourage your knee to go straight.   Use the walker while you are up and around for balance.  Wear your support stockings 24/7 to prevent blood clots and take baby aspirin twice daily for 30 days also to prevent blood clots  Follow up with Dr Ranell Patrick in two weeks in the office, call [phone] for appt  Call DR Habeeb Puertas at [phone] (cell phone) with any questions or concerns   INSTRUCTIONS AFTER JOINT REPLACEMENT   Remove items at home which could result in a fall. This includes throw rugs or furniture in walking pathways ICE to the affected joint every three hours while awake for 30 minutes at a time, for at least the first 3-5 days, and then as needed for pain and swelling.  Continue to use ice for pain and swelling. You may notice swelling that will progress down to the foot and ankle.  This is normal after surgery.  Elevate your leg when you are not up walking on it.   Continue to use the breathing machine you got in the hospital (incentive spirometer) which will help keep your temperature down.  It is common for your temperature to cycle up and down following surgery, especially at night when you are not up moving around and exerting yourself.  The breathing machine keeps your lungs expanded and your temperature down.   DIET:  As you were doing prior to hospitalization, we recommend a well-balanced diet.  DRESSING / WOUND CARE / SHOWERING  You may change your dressing 3-5 days after surgery.  Then change the dressing every day with sterile gauze.  Please use good hand washing techniques before changing the dressing.  Do not use any lotions or creams on the incision until instructed by your  surgeon.  ACTIVITY  Increase activity slowly as tolerated, but follow the weight bearing instructions below.   No driving for 6 weeks or until further direction given by your physician.  You cannot drive while taking narcotics.  No lifting or carrying greater than 10 lbs. until further directed by your surgeon. Avoid periods of inactivity such as sitting longer than an hour when not asleep. This helps prevent blood clots.  You may return to work once you are authorized by your doctor.     WEIGHT BEARING   Weight bearing as tolerated with assist device (walker, cane, etc) as directed, use it as long as suggested by your surgeon or therapist, typically at least 4-6 weeks.   EXERCISES  Results after joint replacement surgery are often greatly improved when you follow the exercise, range of motion and muscle strengthening exercises prescribed by your doctor. Safety measures are also important to protect the joint from further injury. Any time any of these exercises cause you to have increased pain or swelling, decrease what you are doing until you are comfortable again and then slowly increase them. If you have problems or questions, call your caregiver or physical therapist for advice.   Rehabilitation is important following a joint replacement. After just a few days of immobilization, the muscles of the leg can become weakened and shrink (atrophy).  These exercises are designed to  build up the tone and strength of the thigh and leg muscles and to improve motion. Often times heat used for twenty to thirty minutes before working out will loosen up your tissues and help with improving the range of motion but do not use heat for the first two weeks following surgery (sometimes heat can increase post-operative swelling).   These exercises can be done on a training (exercise) mat, on the floor, on a table or on a bed. Use whatever works the best and is most comfortable for you.    Use music or  television while you are exercising so that the exercises are a pleasant break in your day. This will make your life better with the exercises acting as a break in your routine that you can look forward to.   Perform all exercises about fifteen times, three times per day or as directed.  You should exercise both the operative leg and the other leg as well.  Exercises include:   Quad Sets - Tighten up the muscle on the front of the thigh (Quad) and hold for 5-10 seconds.   Straight Leg Raises - With your knee straight (if you were given a brace, keep it on), lift the leg to 60 degrees, hold for 3 seconds, and slowly lower the leg.  Perform this exercise against resistance later as your leg gets stronger.  Leg Slides: Lying on your back, slowly slide your foot toward your buttocks, bending your knee up off the floor (only go as far as is comfortable). Then slowly slide your foot back down until your leg is flat on the floor again.  Angel Wings: Lying on your back spread your legs to the side as far apart as you can without causing discomfort.  Hamstring Strength:  Lying on your back, push your heel against the floor with your leg straight by tightening up the muscles of your buttocks.  Repeat, but this time bend your knee to a comfortable angle, and push your heel against the floor.  You may put a pillow under the heel to make it more comfortable if necessary.   A rehabilitation program following joint replacement surgery can speed recovery and prevent re-injury in the future due to weakened muscles. Contact your doctor or a physical therapist for more information on knee rehabilitation.    CONSTIPATION  Constipation is defined medically as fewer than three stools per week and severe constipation as less than one stool per week.  Even if you have a regular bowel pattern at home, your normal regimen is likely to be disrupted due to multiple reasons following surgery.  Combination of anesthesia,  postoperative narcotics, change in appetite and fluid intake all can affect your bowels.   YOU MUST use at least one of the following options; they are listed in order of increasing strength to get the job done.  They are all available over the counter, and you may need to use some, POSSIBLY even all of these options:    Drink plenty of fluids (prune juice may be helpful) and high fiber foods Colace 100 mg by mouth twice a day  Senokot for constipation as directed and as needed Dulcolax (bisacodyl), take with full glass of water  Miralax (polyethylene glycol) once or twice a day as needed.  If you have tried all these things and are unable to have a bowel movement in the first 3-4 days after surgery call either your surgeon or your primary doctor.    If  you experience loose stools or diarrhea, hold the medications until you stool forms back up.  If your symptoms do not get better within 1 week or if they get worse, check with your doctor.  If you experience "the worst abdominal pain ever" or develop nausea or vomiting, please contact the office immediately for further recommendations for treatment.   ITCHING:  If you experience itching with your medications, try taking only a single pain pill, or even half a pain pill at a time.  You can also use Benadryl over the counter for itching or also to help with sleep.   TED HOSE STOCKINGS:  Use stockings on both legs until for at least 2 weeks or as directed by physician office. They may be removed at night for sleeping.  MEDICATIONS:  See your medication summary on the "After Visit Summary" that nursing will review with you.  You may have some home medications which will be placed on hold until you complete the course of blood thinner medication.  It is important for you to complete the blood thinner medication as prescribed.  PRECAUTIONS:  If you experience chest pain or shortness of breath - call 911 immediately for transfer to the hospital emergency  department.   If you develop a fever greater that 101 F, purulent drainage from wound, increased redness or drainage from wound, foul odor from the wound/dressing, or calf pain - CONTACT YOUR SURGEON.                                                   FOLLOW-UP APPOINTMENTS:  If you do not already have a post-op appointment, please call the office for an appointment to be seen by your surgeon.  Guidelines for how soon to be seen are listed in your "After Visit Summary", but are typically between 1-4 weeks after surgery.  OTHER INSTRUCTIONS:   Knee Replacement:  Do not place pillow under knee, focus on keeping the knee straight while resting. CPM instructions: 0-90 degrees, 2 hours in the morning, 2 hours in the afternoon, and 2 hours in the evening. Place foam block, curve side up under heel at all times except when in CPM or when walking.  DO NOT modify, tear, cut, or change the foam block in any way.  POST-OPERATIVE OPIOID TAPER INSTRUCTIONS: It is important to wean off of your opioid medication as soon as possible. If you do not need pain medication after your surgery it is ok to stop day one. Opioids include: Codeine, Hydrocodone(Norco, Vicodin), Oxycodone(Percocet, oxycontin) and hydromorphone amongst others.  Long term and even short term use of opiods can cause: Increased pain response Dependence Constipation Depression Respiratory depression And more.  Withdrawal symptoms can include Flu like symptoms Nausea, vomiting And more Techniques to manage these symptoms Hydrate well Eat regular healthy meals Stay active Use relaxation techniques(deep breathing, meditating, yoga) Do Not substitute Alcohol to help with tapering If you have been on opioids for less than two weeks and do not have pain than it is ok to stop all together.  Plan to wean off of opioids This plan should start within one week post op of your joint replacement. Maintain the same interval or time between taking  each dose and first decrease the dose.  Cut the total daily intake of opioids by one tablet each day Next start  to increase the time between doses. The last dose that should be eliminated is the evening dose.   MAKE SURE YOU:  Understand these instructions.  Get help right away if you are not doing well or get worse.    Thank you for letting us be a part of your medical care team.  It is a privilege we respect greatly.  We hope these instructions will help you stay on track for a fast and full recovery!

## 2022-07-17 NOTE — Brief Op Note (Signed)
07/17/2022  9:22 AM  PATIENT:  [name]  67 y.o. male  PRE-OPERATIVE DIAGNOSIS:  Osteoarthritis of left knee joint  POST-OPERATIVE DIAGNOSIS:  Osteoarthritis of left knee joint  PROCEDURE:  Procedure(s) with comments: TOTAL KNEE ARTHROPLASTY (Left) - spinal DePuy Attune  SURGEON:  Surgeon(s) and Role:    Beverely Low, MD - Primary  PHYSICIAN ASSISTANT:   ASSISTANTS: Thea Gist, PA-C   ANESTHESIA:   regional and spinal  EBL:  50 mL   BLOOD ADMINISTERED:none  DRAINS: none   LOCAL MEDICATIONS USED:  MARCAINE     SPECIMEN:  No Specimen  DISPOSITION OF SPECIMEN:  N/A  COUNTS:  YES  TOURNIQUET:   Total Tourniquet Time Documented: Thigh (Left) - 91 minutes Total: Thigh (Left) - 91 minutes   DICTATION: .Other Dictation: Dictation Number [id number]  PLAN OF CARE: Admit for overnight observation  PATIENT DISPOSITION:  PACU - hemodynamically stable.   Delay start of Pharmacological VTE agent (>24hrs) due to surgical blood loss or risk of bleeding: no

## 2022-07-17 NOTE — Anesthesia Postprocedure Evaluation (Signed)
Anesthesia Post Note  Patient: Shayla Donnel  Procedure(s) Performed: TOTAL KNEE ARTHROPLASTY (Left: Knee)     Patient location during evaluation: PACU Anesthesia Type: Spinal Level of consciousness: awake and alert Pain management: pain level controlled Vital Signs Assessment: post-procedure vital signs reviewed and stable Respiratory status: spontaneous breathing and respiratory function stable Cardiovascular status: blood pressure returned to baseline and stable Postop Assessment: spinal receding and no apparent nausea or vomiting Anesthetic complications: no   No notable events documented.  Last Vitals:  Vitals:   07/17/22 1045 07/17/22 1100  BP: (!) 99/53 92/63  Pulse: 69 65  Resp: (!) 23   Temp: 36.8 C 36.7 C  SpO2: 96% 96%    Last Pain:  Vitals:   07/17/22 1100  TempSrc: Oral  PainSc:                  Beryle Lathe

## 2022-07-17 NOTE — TOC Transition Note (Signed)
Transition of Care [facility]) - CM/SW Discharge Note   Patient Details  Name: Dontravious Donia MRN: [id number] Date of Birth: 1955-08-01  [facility] of [facility] CM/SW Contact:  Amada Jupiter, LCSW Phone Number: 07/17/2022, 2:33 PM   Clinical Narrative:     Met with pt and wife who confirm he has needed DME in the home.  OPPT already set up with [facility].  No TOC needs.  Final next level of care: OP Rehab Barriers to Discharge: No Barriers Identified   Patient Goals and CMS Choice      Discharge Placement                         Discharge Plan and Services Additional resources added to the After Visit Summary for                  DME Arranged: N/A DME Agency: NA                  Social Determinants of Health (SDOH) Interventions SDOH Screenings   Alcohol Screen: Low Risk  (09/03/2017)  Tobacco Use: Medium Risk (07/17/2022)     Readmission Risk Interventions     No data to display

## 2022-07-17 NOTE — Anesthesia Procedure Notes (Signed)
Anesthesia Regional Block: Adductor canal block   Pre-Anesthetic Checklist: , timeout performed,  Correct Patient, Correct Site, Correct Laterality,  Correct Procedure, Correct Position, site marked,  Risks and benefits discussed,  Surgical consent,  Pre-op evaluation,  At surgeon's request and post-op pain management  Laterality: Left  Prep: chloraprep       Needles:  Injection technique: Single-shot  Needle Type: Echogenic Needle     Needle Length: 10cm  Needle Gauge: 21     Additional Needles:   Narrative:  Start time: 07/17/2022 7:00 AM End time: 07/17/2022 7:03 AM Injection made incrementally with aspirations every 5 mL.  Performed by: Personally  Anesthesiologist: Beryle Lathe, MD  Additional Notes: No pain on injection. No increased resistance to injection. Injection made in 5cc increments. Good needle visualization. Patient tolerated the procedure well.

## 2022-07-17 NOTE — Op Note (Signed)
[name], [name] MEDICAL RECORD NO: [id number] ACCOUNT NO: [id number] DATE OF BIRTH: 1955-04-16 FACILITY: [facility] LOCATION: [facility] PHYSICIAN: Almedia Balls. Ranell Patrick, MD  Operative Report   DATE OF PROCEDURE: 07/17/2022  PREOPERATIVE DIAGNOSIS:  Left knee end-stage arthritis.  POSTOPERATIVE DIAGNOSIS:  Left knee end-stage arthritis.  PROCEDURE PERFORMED:  Left total knee arthroplasty using DePuy Attune prosthesis.  ATTENDING SURGEON:  Almedia Balls. Ranell Patrick, MD  ASSISTANT:  Konrad Felix Dixon, [place], who was scrubbed during the entire procedure, and necessary for satisfactory completion of surgery.  ANESTHESIA:  Spinal anesthesia was used plus adductor canal block.  ESTIMATED BLOOD LOSS:  50 mL.  FLUID REPLACEMENT:  1500 mL crystalloid.  COUNTS:  Instrument counts correct.  COMPLICATIONS:  No complications.  ANTIBIOTICS:  Perioperative antibiotics were given.  INDICATIONS:  The patient is a 67 year old male who presents with worsening left knee pain secondary to bone-on-bone end-stage arthritis.  The patient has failed conservative management, desires operative treatment to eliminate pain and restore function.   Informed consent obtained.  DESCRIPTION OF PROCEDURE:  After an adequate level of spinal anesthesia was achieved, plus an adductor canal block placed preoperatively.  The patient was positioned supine on the operating table.  Nonsterile tourniquet placed on proximal thigh.  Left  leg sterilely prepped and draped in the usual manner.  Timeout called, verifying correct patient, correct site, we elevated the leg and exsanguinated with an Esmarch bandage, inflating the tourniquet to 300 mmHg.  We placed the knee in flexion and  performed a longitudinal midline incision with a 10 blade scalpel.  Dissection down through subcutaneous tissues.  We used a fresh 10 blade scalpel for the medial parapatellar arthrotomy.  We then divided lateral patellofemoral ligaments everting the  patella and  exposing the distal femur.  There was a devoid of cartilage.  We entered the distal femur with a step cut drill.  We then placed our intramedullary guide and resected 9 mm off the distal femur set on 5 degrees of valgus.  Next, we sized the  femur to a size 8 anterior down.  We then performed anterior, posterior and chamfer cuts with the 4-in-1 block.  We then removed ACL and PCL meniscal tissue subluxing the tibia anteriorly and gaining good exposure.  We then used an external jig to cut  the tibia, 90 degrees perpendicular to the long axis of tibia with minimal posterior slope for this posterior cruciate substituting prosthesis and we resected 2 mm off the medial affected side.  Once we had our tibial cut done, we used lamina spreader  and removed posterior femoral condyle osteophytes and posterior capsule.  We injected the posterior capsule with a combination of Marcaine, Exparel and saline.  Then, we checked our gaps, which were at least 6 mm, but symmetric.  We removed the pins from  the tibia.  We finished our tibial preparation for the 8 tibia externally rotating the component as much as possible.  At this point, once we had our tibial preparation completed with the modular drill and keel punch we had our 8 trial tibia in place,  we went to our femur and cut the box for the 8 left femur.  Once we had the box cut done, we impacted the trial in place and then drilled the lug holes.  We reduced initially with a 6 mm poly.  We then went to the 8 poly trial, reduced the knee and had  good stability and this would be what we cemented with.  We then went ahead and resurfaced the patella going from a 27 mm thickness down to a 17 mm thickness drilling lug holes for the 41 patellar button.  We then ranged the knee with the patellar trial  in place with excellent patellar tracking no-touch technique.  Once we removed all the trial components, we pulse irrigated the knee.  We then vacuum mixed high viscosity  cement on the back table and after thoroughly drying the bone we cemented the  components into place tibia, femur and patella, all in one step.  We placed the knee in extension with the 8 mm poly trial for good compression throughout the cement setting process.  We also used a patellar clamp for the patella to keep compression on  that.  Once all cement was hardened on the back table, we removed excess cement with quarter-inch curved osteotome.  We then selected the real size 10 poly and then placed the poly on the tibial tray and reduced the knee.  We had appropriate stability  both in flexion and extension, excellent alignment and patellar tracking.  We then injected the anterior capsule with combination of Marcaine, Exparel and saline.  After irrigation, we closed the parapatellar arthrotomy with #1 Vicryl suture, followed by  0 in 2 layered subcutaneous closure and 4-0 Monocryl for skin.  Steri-Strips applied followed by sterile dressing.  The patient tolerated surgery well.   PUS D: 07/17/2022 9:28:28 am T: 07/17/2022 9:47:00 am  JOB: [id number]/ [id number]

## 2022-07-17 NOTE — Interval H&P Note (Signed)
History and Physical Interval Note:  07/17/2022 7:22 AM  [name]  has presented today for surgery, with the diagnosis of Osteoarthritis of left knee joint.  The various methods of treatment have been discussed with the patient and family. After consideration of risks, benefits and other options for treatment, the patient has consented to  Procedure(s) with comments: TOTAL KNEE ARTHROPLASTY (Left) - spinal and general as a surgical intervention.  The patient's history has been reviewed, patient examined, no change in status, stable for surgery.  I have reviewed the patient's chart and labs.  Questions were answered to the patient's satisfaction.     Verlee Rossetti

## 2022-07-17 NOTE — Plan of Care (Signed)
  Problem: Education: Goal: Knowledge of the prescribed therapeutic regimen will improve Outcome: Progressing   Problem: Pain Management: Goal: Pain level will decrease with appropriate interventions Outcome: Progressing   Problem: Education: Goal: Knowledge of General Education information will improve Description: Including pain rating scale, medication(s)/side effects and non-pharmacologic comfort measures Outcome: Progressing   

## 2022-07-17 NOTE — Transfer of Care (Signed)
Immediate Anesthesia Transfer of Care Note  Patient: [name]  Procedure(s) Performed: TOTAL KNEE ARTHROPLASTY (Left: Knee)  Patient Location: PACU  Anesthesia Type:MAC combined with regional for post-op pain  Level of Consciousness: sedated and drowsy  Airway & Oxygen Therapy: Patient connected to face mask oxygen  Post-op Assessment: Report given to RN and Post -op Vital signs reviewed and stable  Post vital signs: stable  Last Vitals:  Vitals Value Taken Time  BP 80/54 07/17/22 0927  Temp    Pulse 58 07/17/22 0928  Resp 7 07/17/22 0928  SpO2 99 % 07/17/22 0928  Vitals shown include unvalidated device data.  Last Pain:  Vitals:   07/17/22 0626  TempSrc: Oral  PainSc:          Complications: No notable events documented.

## 2022-07-17 NOTE — Progress Notes (Signed)
   07/17/22 2202  BiPAP/CPAP/SIPAP  $ Non-Invasive Ventilator  Non-Invasive Vent Initial  BiPAP/CPAP/SIPAP Pt Type Adult (Placed pt on cpap. machine is good and pt feels comfortable)  BiPAP/CPAP/SIPAP DREAMSTATIOND  Mask Type Nasal mask (Pts home tubing and mask)  FiO2 (%) 21 %  Patient Home Equipment No  Auto Titrate Yes (auto b 20/6 pt dont remember home settings and feel comfortable on these settings)  BiPAP/CPAP /SiPAP Vitals  Pulse Rate 77  Resp 18  SpO2 92 %  Bilateral Breath Sounds Clear;Diminished  MEWS Score/Color  MEWS Score 0  MEWS Score Color Green

## 2022-07-17 NOTE — Evaluation (Signed)
Physical Therapy Evaluation Patient Details Name: [name] MRN: [id number] DOB: [birth year] Today's Date: 07/17/2022  History of Present Illness  Pt is 67 yo male s/p L TKA on 07/17/22.  Pt with hx including anxiety, ADD, bipolar disorder, CHF, COPD, arthritis  Clinical Impression  Pt is s/p TKA resulting in the deficits listed below (see PT Problem List). At baseline, pt is independent.  He will have support at home, has DME, but has 3 steps to enter home without railing.  His bedroom is upstairs but reports could sleep in chair downstairs, but did mention it seats low.  Today, pt willing to work with therapy but had severe pain that limited.  He had been premedicated (Dilaudid 12:29, Robaxin 14:24, oxycodone 10 at 11:47, and ultram 14:24).  He required mod A to stand from significantly elevated bed then ambulated 30' with min A with decreased weight shift to L.  Pt was impulsive at times requiring cues for slower movements and relaxation.  Did not position in chair due to pain control and difficulty standing from low surface today.  Pt expected to progress with therapy, but may be slower due to pain control.    Pt will benefit from acute skilled PT to increase their independence and safety with mobility to allow discharge.         Recommendations for follow up therapy are one component of a multi-disciplinary discharge planning process, led by the attending physician.  Recommendations may be updated based on patient status, additional functional criteria and insurance authorization.  Follow Up Recommendations       Assistance Recommended at Discharge Frequent or constant Supervision/Assistance  Patient can return home with the following  A lot of help with walking and/or transfers;A lot of help with bathing/dressing/bathroom;Assistance with cooking/housework;Help with stairs or ramp for entrance    Equipment Recommendations None recommended by PT  Recommendations for Other Services        Functional Status Assessment Patient has had a recent decline in their functional status and demonstrates the ability to make significant improvements in function in a reasonable and predictable amount of time.     Precautions / Restrictions Precautions Precautions: Fall Restrictions Weight Bearing Restrictions: Yes LLE Weight Bearing: Weight bearing as tolerated      Mobility  Bed Mobility Overal bed mobility: Needs Assistance Bed Mobility: Supine to Sit, Sit to Supine     Supine to sit: Min assist Sit to supine: Min assist   General bed mobility comments: cues for controlled movement with min A for L LE    Transfers Overall transfer level: Needs assistance Equipment used: Rolling walker (2 wheels) Transfers: Sit to/from Stand Sit to Stand: From elevated surface, Mod assist           General transfer comment: Required cues for hand placement, L LE management, slow movements, and then mod A to rise from significantly elevated bed    Ambulation/Gait Ambulation/Gait assistance: Min assist Gait Distance (Feet): 30 Feet Assistive device: Rolling walker (2 wheels) Gait Pattern/deviations: Step-to pattern, Decreased stride length, Decreased weight shift to left, Antalgic Gait velocity: decreased     General Gait Details: Very antalgic pattern with little weight shift to L due to pain.  Cued for RW proximity and relaxation  Stairs            Wheelchair Mobility    Modified Rankin (Stroke Patients Only)       Balance Overall balance assessment: Needs assistance Sitting-balance support: No upper extremity supported  Sitting balance-Leahy Scale: Good     Standing balance support: No upper extremity supported, Bilateral upper extremity supported Standing balance-Leahy Scale: Fair Standing balance comment: RW to ambulate but able to static stand for ADLs without support                             Pertinent Vitals/Pain Pain Assessment Pain  Assessment: 0-10 Pain Score: 10-Worst pain ever Pain Location: L knee Pain Descriptors / Indicators: Sharp, Constant, Grimacing, Guarding Pain Intervention(s): Limited activity within patient's tolerance, Monitored during session, Premedicated before session, Repositioned, Ice applied, Relaxation (well premedicated)    Home Living Family/patient expects to be discharged to:: Private residence Living Arrangements: Spouse/significant other Available Help at Discharge: Family;Available 24 hours/day (wife and daughter to assist initially) Type of Home: House Home Access: Stairs to enter Entrance Stairs-Rails: None Entrance Stairs-Number of Steps: 3 (small steps) Alternate Level Stairs-Number of Steps: Bedroom upstairs but can sleep in chair downstairs; flight of steps Home Layout: Multi-level;1/2 bath on main level Home Equipment: Agricultural consultant (2 wheels);Cane - single point      Prior Function Prior Level of Function : Independent/Modified Independent             Mobility Comments: Could ambulate in community was using cane (in community) ADLs Comments: independent adls and iadls     Hand Dominance        Extremity/Trunk Assessment   Upper Extremity Assessment Upper Extremity Assessment: Overall WFL for tasks assessed    Lower Extremity Assessment Lower Extremity Assessment: LLE deficits/detail;RLE deficits/detail RLE Deficits / Details: ROM WFL; MMT 5/5 LLE Deficits / Details: Expected post op changes; ROM: 10 to 60 degrees at knee; MMT: ankle 5/5, knee 2/5, hip 3/5    Cervical / Trunk Assessment Cervical / Trunk Assessment: Normal  Communication   Communication: No difficulties  Cognition Arousal/Alertness: Awake/alert Behavior During Therapy: Impulsive, Anxious Overall Cognitive Status: Within Functional Limits for tasks assessed                                 General Comments: Frequent cues for relaxation and taking his time. Pt often with quick  movements leading to increased pain        General Comments General comments (skin integrity, edema, etc.): Educated on PT role and POC.  Discussed resting with leg straight.  Pt asked about bone foam in room - advised only towel roll under ankle if able at this point due to severe pain.    Exercises     Assessment/Plan    PT Assessment Patient needs continued PT services  PT Problem List Decreased strength;Pain;Decreased range of motion;Decreased activity tolerance;Decreased balance;Decreased mobility;Decreased knowledge of precautions;Decreased knowledge of use of DME;Decreased safety awareness       PT Treatment Interventions DME instruction;Therapeutic exercise;Gait training;Stair training;Functional mobility training;Therapeutic activities;Patient/family education;Modalities    PT Goals (Current goals can be found in the Care Plan section)  Acute Rehab PT Goals Patient Stated Goal: return home PT Goal Formulation: With patient/family Time For Goal Achievement: 07/31/22 Potential to Achieve Goals: Good    Frequency 7X/week     Co-evaluation               AM-PAC PT "6 Clicks" Mobility  Outcome Measure Help needed turning from your back to your side while in a flat bed without using bedrails?: A Little Help needed moving from lying  on your back to sitting on the side of a flat bed without using bedrails?: A Little Help needed moving to and from a bed to a chair (including a wheelchair)?: A Little Help needed standing up from a chair using your arms (e.g., wheelchair or bedside chair)?: A Lot Help needed to walk in hospital room?: A Little Help needed climbing 3-5 steps with a railing? : Total 6 Click Score: 15    End of Session Equipment Utilized During Treatment: Gait belt Activity Tolerance: Patient limited by pain Patient left: in bed;with call bell/phone within reach;with SCD's reapplied Nurse Communication: Patient requests pain meds PT Visit Diagnosis:  Other abnormalities of gait and mobility (R26.89);Muscle weakness (generalized) (M62.81);Pain Pain - Right/Left: Left Pain - part of body: Knee    Time: 1525-1600 PT Time Calculation (min) (ACUTE ONLY): 35 min   Charges:   PT Evaluation $PT Eval Low Complexity: 1 Low PT Treatments $Therapeutic Activity: 8-22 mins        Anise Salvo, PT Acute Rehab [facility] [phone]   Rayetta Humphrey 07/17/2022, 4:15 PM

## 2022-07-17 NOTE — Anesthesia Procedure Notes (Signed)
Spinal  Patient location during procedure: OR Start time: 07/17/2022 7:33 AM End time: 07/17/2022 7:36 AM Reason for block: surgical anesthesia Staffing Performed: anesthesiologist  Anesthesiologist: Beryle Lathe, MD Performed by: Beryle Lathe, MD Authorized by: Beryle Lathe, MD   Preanesthetic Checklist Completed: patient identified, IV checked, risks and benefits discussed, surgical consent, monitors and equipment checked, pre-op evaluation and timeout performed Spinal Block Patient position: sitting Prep: DuraPrep Patient monitoring: heart rate, cardiac monitor, continuous pulse ox and blood pressure Approach: midline Location: L2-3 Injection technique: single-shot Needle Needle type: Pencan  Needle gauge: 24 G Additional Notes Consent was obtained prior to the procedure with all questions answered and concerns addressed. Risks including, but not limited to, bleeding, infection, nerve damage, paralysis, failed block, inadequate analgesia, allergic reaction, high spinal, itching, and headache were discussed and the patient wished to proceed. Functioning IV was confirmed and monitors were applied. Sterile prep and drape, including hand hygiene, mask, and sterile gloves were used. The patient was positioned and the spine was prepped. The skin was anesthetized with lidocaine. Free flow of clear CSF was obtained prior to injecting local anesthetic into the CSF. The spinal needle aspirated freely following injection. The needle was carefully withdrawn. The patient tolerated the procedure well.   Leslye Peer, MD

## 2022-07-17 NOTE — Progress Notes (Signed)
Orthopedic Tech Progress Note Patient Details:  [name] 01/15/1956 [id number] CPM will be removed at 2pm.  CPM Left Knee CPM Left Knee: On Left Knee Flexion (Degrees): 90 Left Knee Extension (Degrees): 0  Post Interventions Patient Tolerated: Well Ortho Devices Type of Ortho Device: Bone foam zero knee Ortho Device/Splint Location: Left knee Ortho Device/Splint Interventions: Application   Post Interventions Patient Tolerated: Well  [name] 07/17/2022, 10:06 AM

## 2022-07-18 DIAGNOSIS — M25762 Osteophyte, left knee: Secondary | ICD-10-CM | POA: Diagnosis present

## 2022-07-18 DIAGNOSIS — M1712 Unilateral primary osteoarthritis, left knee: Secondary | ICD-10-CM | POA: Diagnosis present

## 2022-07-18 DIAGNOSIS — Z79899 Other long term (current) drug therapy: Secondary | ICD-10-CM | POA: Diagnosis not present

## 2022-07-18 DIAGNOSIS — I110 Hypertensive heart disease with heart failure: Secondary | ICD-10-CM | POA: Diagnosis present

## 2022-07-18 DIAGNOSIS — Z888 Allergy status to other drugs, medicaments and biological substances status: Secondary | ICD-10-CM | POA: Diagnosis not present

## 2022-07-18 DIAGNOSIS — K219 Gastro-esophageal reflux disease without esophagitis: Secondary | ICD-10-CM | POA: Diagnosis present

## 2022-07-18 DIAGNOSIS — F319 Bipolar disorder, unspecified: Secondary | ICD-10-CM | POA: Diagnosis present

## 2022-07-18 DIAGNOSIS — F900 Attention-deficit hyperactivity disorder, predominantly inattentive type: Secondary | ICD-10-CM | POA: Diagnosis present

## 2022-07-18 DIAGNOSIS — J449 Chronic obstructive pulmonary disease, unspecified: Secondary | ICD-10-CM | POA: Diagnosis present

## 2022-07-18 DIAGNOSIS — Z87891 Personal history of nicotine dependence: Secondary | ICD-10-CM | POA: Diagnosis not present

## 2022-07-18 DIAGNOSIS — G4730 Sleep apnea, unspecified: Secondary | ICD-10-CM | POA: Diagnosis present

## 2022-07-18 DIAGNOSIS — I5042 Chronic combined systolic (congestive) and diastolic (congestive) heart failure: Secondary | ICD-10-CM | POA: Diagnosis present

## 2022-07-18 DIAGNOSIS — Z8701 Personal history of pneumonia (recurrent): Secondary | ICD-10-CM | POA: Diagnosis not present

## 2022-07-18 DIAGNOSIS — F419 Anxiety disorder, unspecified: Secondary | ICD-10-CM | POA: Diagnosis present

## 2022-07-18 DIAGNOSIS — Z7952 Long term (current) use of systemic steroids: Secondary | ICD-10-CM | POA: Diagnosis not present

## 2022-07-18 DIAGNOSIS — Z79890 Hormone replacement therapy (postmenopausal): Secondary | ICD-10-CM | POA: Diagnosis not present

## 2022-07-18 DIAGNOSIS — Z6838 Body mass index (BMI) 38.0-38.9, adult: Secondary | ICD-10-CM | POA: Diagnosis not present

## 2022-07-18 MED ORDER — LORAZEPAM 1 MG PO TABS
1.0000 mg | ORAL_TABLET | Freq: Four times a day (QID) | ORAL | Status: DC | PRN
  Administered 2022-07-18 – 2022-07-19 (×2): 2 mg via ORAL
  Filled 2022-07-18 (×3): qty 2

## 2022-07-18 MED ORDER — ALPRAZOLAM 0.5 MG PO TABS
0.5000 mg | ORAL_TABLET | Freq: Once | ORAL | Status: AC
  Administered 2022-07-18: 0.5 mg via ORAL
  Filled 2022-07-18: qty 1

## 2022-07-18 MED ORDER — HYDROMORPHONE HCL 2 MG PO TABS
2.0000 mg | ORAL_TABLET | ORAL | Status: DC | PRN
  Administered 2022-07-18 (×4): 4 mg via ORAL
  Administered 2022-07-19: 2 mg via ORAL
  Administered 2022-07-19 (×2): 4 mg via ORAL
  Administered 2022-07-19 – 2022-07-20 (×3): 2 mg via ORAL
  Filled 2022-07-18 (×3): qty 2
  Filled 2022-07-18: qty 1
  Filled 2022-07-18: qty 2
  Filled 2022-07-18 (×2): qty 1
  Filled 2022-07-18 (×2): qty 2
  Filled 2022-07-18: qty 1

## 2022-07-18 MED ORDER — HYDROMORPHONE HCL 1 MG/ML IJ SOLN
0.5000 mg | INTRAMUSCULAR | Status: DC | PRN
  Administered 2022-07-18 (×2): 1 mg via INTRAVENOUS
  Filled 2022-07-18 (×2): qty 1

## 2022-07-18 NOTE — Progress Notes (Addendum)
Physical Therapy Treatment Patient Details Name: [name] MRN: [id number] DOB: [birth year] Today's Date: 07/18/2022   History of Present Illness Pt is 67 yo male s/p L TKA on 07/17/22.  Pt with hx including anxiety, ADD, bipolar disorder, CHF, COPD, arthritis    PT Comments    Pt agreeable to 2nd session. He was pre-medicated prior to session. Able to increase ambulation distance a bit but he continues to report mod-severe pain with activity. Deferred ROM exercises on today due to pain control issues. Assisted pt back to bed for comfort.  Will continue to follow and progress activity as tolerated.    Recommendations for follow up therapy are one component of a multi-disciplinary discharge planning process, led by the attending physician.  Recommendations may be updated based on patient status, additional functional criteria and insurance authorization.  Follow Up Recommendations       Assistance Recommended at Discharge Frequent or constant Supervision/Assistance  Patient can return home with the following A little help with walking and/or transfers;A lot of help with bathing/dressing/bathroom;Assistance with cooking/housework;Assist for transportation;Help with stairs or ramp for entrance   Equipment Recommendations  None recommended by PT    Recommendations for Other Services       Precautions / Restrictions Precautions Precautions: Fall Restrictions Weight Bearing Restrictions: No LLE Weight Bearing: Weight bearing as tolerated     Mobility  Bed Mobility Overal bed mobility: Needs Assistance Bed Mobility: Supine to Sit, Sit to Supine     Supine to sit: Min assist Sit to supine: Min assist   General bed mobility comments: Assist for L LE.    Transfers Overall transfer level: Needs assistance Equipment used: Rolling walker (2 wheels) Transfers: Sit to/from Stand Sit to Stand: Min assist, From elevated surface           General transfer comment: Assist to  power up, stabilize, control descent. Cues for safety, technique, hand/LE placement. Increased time.    Ambulation/Gait Ambulation/Gait assistance: Min assist Gait Distance (Feet): 50 Feet Assistive device: Rolling walker (2 wheels) Gait Pattern/deviations: Step-to pattern, Antalgic       General Gait Details: Cues for safety, posture, RW proximity, technique, step lengths, sequencing. Ambulation distance limited by pain although pt did better this afternoon compared to this morning. Assist to steady.   Stairs             Wheelchair Mobility    Modified Rankin (Stroke Patients Only)       Balance Overall balance assessment: Needs assistance         Standing balance support: Bilateral upper extremity supported, During functional activity, Reliant on assistive device for balance Standing balance-Leahy Scale: Poor                              Cognition Arousal/Alertness: Awake/alert Behavior During Therapy: Anxious Overall Cognitive Status: Within Functional Limits for tasks assessed                                 General Comments: Frequent cues for relaxation and taking his time. Pt often with quick movements leading to increased pain        Exercises      General Comments        Pertinent Vitals/Pain Pain Assessment Pain Assessment: Faces Pain Score: 8  Faces Pain Scale: Hurts whole lot Pain Location: L knee/thigh Pain Descriptors /  Indicators: Aching, Discomfort, Operative site guarding Pain Intervention(s): Limited activity within patient's tolerance, Monitored during session, Repositioned, Ice applied, Premedicated before session    Home Living                          Prior Function            PT Goals (current goals can now be found in the care plan section) Progress towards PT goals: Progressing toward goals    Frequency    7X/week      PT Plan Current plan remains appropriate     Co-evaluation              AM-PAC PT "6 Clicks" Mobility   Outcome Measure  Help needed turning from your back to your side while in a flat bed without using bedrails?: A Little Help needed moving from lying on your back to sitting on the side of a flat bed without using bedrails?: A Little Help needed moving to and from a bed to a chair (including a wheelchair)?: A Little Help needed standing up from a chair using your arms (e.g., wheelchair or bedside chair)?: A Little Help needed to walk in hospital room?: A Little Help needed climbing 3-5 steps with a railing? : Total 6 Click Score: 16    End of Session Equipment Utilized During Treatment: Gait belt;Knee Immobilizer Activity Tolerance: Patient limited by pain Patient left: in bed;with call bell/phone within reach;with bed alarm set;with family/visitor present   PT Visit Diagnosis: Other abnormalities of gait and mobility (R26.89);Muscle weakness (generalized) (M62.81);Pain Pain - Right/Left: Left Pain - part of body: Knee     Time: 1191-4782 PT Time Calculation (min) (ACUTE ONLY): 15 min  Charges:  $Gait Training: 8-22 mins                         Faye Ramsay, PT Acute Rehabilitation  Office: [phone]

## 2022-07-18 NOTE — Progress Notes (Signed)
Subjective: 1 Day Post-Op Procedure(s) (LRB): TOTAL KNEE ARTHROPLASTY (Left) Patient reports pain as severe.  Reports significant pain despite 15mg  oxy. Feels panicky because of pain level. No other c/o.  Objective: Vital signs in last 24 hours: Temp:  [97.8 F (36.6 C)-98.5 F (36.9 C)] 98.5 F (36.9 C) (06/01 0626) Pulse Rate:  [60-87] 78 (06/01 0626) Resp:  [7-23] 18 (06/01 0626) BP: (80-134)/(53-84) 134/84 (06/01 0626) SpO2:  [91 %-98 %] 91 % (06/01 0626) FiO2 (%):  [21 %] 21 % (05/31 2202)  Intake/Output from previous day: 05/31 0701 - 06/01 0700 In: 3162.5 [P.O.:840; I.V.:2222.5; IV Piggyback:100] Out: 4500 [Urine:4450; Blood:50] Intake/Output this shift: No intake/output data recorded.  No results for input(s): "HGB" in the last 72 hours. No results for input(s): "WBC", "RBC", "HCT", "PLT" in the last 72 hours. No results for input(s): "NA", "K", "CL", "CO2", "BUN", "CREATININE", "GLUCOSE", "CALCIUM" in the last 72 hours. No results for input(s): "LABPT", "INR" in the last 72 hours.  Neurologically intact ABD soft Neurovascular intact Sensation intact distally Intact pulses distally Dorsiflexion/Plantar flexion intact Incision: dressing C/D/I and no drainage No cellulitis present Compartment soft No sign of DVT   Assessment/Plan: 1 Day Post-Op Procedure(s) (LRB): TOTAL KNEE ARTHROPLASTY (Left) Advance diet Up with therapy D/C IV fluids Will switch from Oxy to Dilaudid PO for pain control Will add Ativan for anxiety Anticipate he stays today for improved pain control and PT and possible D/C tomorrow   Anticipated LOS equal to or greater than 2 midnights due to - Age 69 and older with one or more of the following:  - Obesity  - Expected need for hospital services (PT, OT, Nursing) required for safe  discharge  - Anticipated need for postoperative skilled nursing care or inpatient rehab  - Active co-morbidities: Respiratory Failure/COPD OR   -  Unanticipated findings during/Post Surgery: Slow post-op progression: GI, pain control, mobility  - Patient is a high risk of re-admission due to: None   Dorothy Spark 07/18/2022, 9:07 AM

## 2022-07-18 NOTE — Progress Notes (Signed)
Physical Therapy Treatment Patient Details Name: [name] MRN: [id number] DOB: 04/27/1955 Today's Date: 07/18/2022   History of Present Illness Pt is 67 yo male s/p L TKA on 07/17/22.  Pt with hx including anxiety, ADD, bipolar disorder, CHF, COPD, arthritis    PT Comments    Pt agreeable to working with therapy. He continues to report significant pain-RN in at start of session to medicate. Progression of mobility is currently limited due to pain. Will continue to follow and progress activity as pt is able to tolerate.    Recommendations for follow up therapy are one component of a multi-disciplinary discharge planning process, led by the attending physician.  Recommendations may be updated based on patient status, additional functional criteria and insurance authorization.  Follow Up Recommendations       Assistance Recommended at Discharge Frequent or constant Supervision/Assistance  Patient can return home with the following A little help with walking and/or transfers;A lot of help with bathing/dressing/bathroom;Assistance with cooking/housework;Assist for transportation;Help with stairs or ramp for entrance   Equipment Recommendations  None recommended by PT    Recommendations for Other Services       Precautions / Restrictions Precautions Precautions: Fall Restrictions Weight Bearing Restrictions: No LLE Weight Bearing: Weight bearing as tolerated     Mobility  Bed Mobility Overal bed mobility: Needs Assistance Bed Mobility: Supine to Sit     Supine to sit: Min assist     General bed mobility comments: cues for controlled movement with min A for L LE    Transfers Overall transfer level: Needs assistance Equipment used: Rolling walker (2 wheels) Transfers: Sit to/from Stand Sit to Stand: Min assist, From elevated surface           General transfer comment: Assist to power up, stabilize, control descent. Cues for safety, technique, hand/LE placement.  Increased time.    Ambulation/Gait Ambulation/Gait assistance: Min assist Gait Distance (Feet): 12 Feet Assistive device: Rolling walker (2 wheels) Gait Pattern/deviations: Step-to pattern, Decreased stride length, Decreased weight shift to left, Antalgic       General Gait Details: Gait remains antalgic with limited weigth shift and weightbearing on L LE. Cues for safety, technique, RW proximity, step lengths, sequencing. Ambulation distance limited by pain.   Stairs             Wheelchair Mobility    Modified Rankin (Stroke Patients Only)       Balance Overall balance assessment: Needs assistance         Standing balance support: Bilateral upper extremity supported, During functional activity, Reliant on assistive device for balance Standing balance-Leahy Scale: Poor                              Cognition Arousal/Alertness: Awake/alert Behavior During Therapy: Impulsive, Anxious Overall Cognitive Status: Within Functional Limits for tasks assessed                                 General Comments: Frequent cues for relaxation and taking his time. Pt often with quick movements leading to increased pain        Exercises      General Comments        Pertinent Vitals/Pain Pain Assessment Pain Assessment: 0-10 Pain Score: 8  Pain Location: L knee/thigh Pain Descriptors / Indicators: Sharp, Constant, Grimacing, Operative site guarding Pain Intervention(s): Limited activity within  patient's tolerance, Monitored during session, Ice applied, Repositioned, Premedicated before session    Home Living                          Prior Function            PT Goals (current goals can now be found in the care plan section) Progress towards PT goals: Progressing toward goals    Frequency    7X/week      PT Plan Current plan remains appropriate    Co-evaluation              AM-PAC PT "6 Clicks" Mobility    Outcome Measure  Help needed turning from your back to your side while in a flat bed without using bedrails?: A Little Help needed moving from lying on your back to sitting on the side of a flat bed without using bedrails?: A Little Help needed moving to and from a bed to a chair (including a wheelchair)?: A Little Help needed standing up from a chair using your arms (e.g., wheelchair or bedside chair)?: A Lot Help needed to walk in hospital room?: A Little Help needed climbing 3-5 steps with a railing? : Total 6 Click Score: 15    End of Session Equipment Utilized During Treatment: Gait belt Activity Tolerance: Patient limited by pain Patient left: in chair;with call bell/phone within reach;with chair alarm set   PT Visit Diagnosis: Other abnormalities of gait and mobility (R26.89);Muscle weakness (generalized) (M62.81);Pain Pain - Right/Left: Left Pain - part of body: Knee     Time: 1610-9604 PT Time Calculation (min) (ACUTE ONLY): 21 min  Charges:  $Gait Training: 8-22 mins                         Faye Ramsay, PT Acute Rehabilitation  Office: [phone]

## 2022-07-18 NOTE — Progress Notes (Signed)
   07/18/22 2002  BiPAP/CPAP/SIPAP  BiPAP/CPAP/SIPAP Pt Type Adult  BiPAP/CPAP/SIPAP DREAMSTATIOND  Mask Type Nasal mask (Pts home mask and tubing)  FiO2 (%) 21 %  Patient Home Equipment No  Auto Titrate Yes (auto b 20/6)  CPAP/SIPAP surface wiped down Yes  BiPAP/CPAP /SiPAP Vitals  Pulse Rate 71  Resp 17  SpO2 95 %  Bilateral Breath Sounds Diminished  MEWS Score/Color  MEWS Score 0  MEWS Score Color Green

## 2022-07-19 NOTE — Progress Notes (Signed)
   07/19/22 2016  BiPAP/CPAP/SIPAP  BiPAP/CPAP/SIPAP Pt Type Adult (Pt said he would wear it when he is ready, Machine ready to be worn)  BiPAP/CPAP/SIPAP DREAMSTATIOND  Mask Type Nasal mask (from home)  FiO2 (%) 21 %  Patient Home Equipment No  Auto Titrate Yes (auto 20/6)  CPAP/SIPAP surface wiped down Yes  BiPAP/CPAP /SiPAP Vitals  Pulse Rate 75  Resp 20  SpO2 97 %  Bilateral Breath Sounds Clear;Diminished  MEWS Score/Color  MEWS Score 0  MEWS Score Color Green

## 2022-07-19 NOTE — Progress Notes (Signed)
Physical Therapy Treatment Patient Details Name: [name] MRN: [id number] DOB: 1955/10/07 Today's Date: 07/19/2022   History of Present Illness Pt is 67 yo male s/p L TKA on 07/17/22.  Pt with hx including anxiety, ADD, bipolar disorder, CHF, COPD, arthritis    PT Comments    Progressing slowly. Moderate pain with activity. Will plan to have a 2nd session to practice stair negotiation.    Recommendations for follow up therapy are one component of a multi-disciplinary discharge planning process, led by the attending physician.  Recommendations may be updated based on patient status, additional functional criteria and insurance authorization.  Follow Up Recommendations       Assistance Recommended at Discharge Frequent or constant Supervision/Assistance  Patient can return home with the following A little help with walking and/or transfers;A lot of help with bathing/dressing/bathroom;Assistance with cooking/housework;Assist for transportation;Help with stairs or ramp for entrance   Equipment Recommendations  None recommended by PT    Recommendations for Other Services       Precautions / Restrictions Precautions Precautions: Fall Restrictions Weight Bearing Restrictions: No LLE Weight Bearing: Weight bearing as tolerated     Mobility  Bed Mobility Overal bed mobility: Needs Assistance Bed Mobility: Supine to Sit     Supine to sit: Min guard, HOB elevated     General bed mobility comments: Pt used gait belt as leg lifter. Cues provided. Increased time.    Transfers Overall transfer level: Needs assistance Equipment used: Rolling walker (2 wheels) Transfers: Sit to/from Stand Sit to Stand: Min assist, From elevated surface           General transfer comment: Assist to power up, stabilize, control descent. Cues for safety, technique, hand/LE placement. Increased time.    Ambulation/Gait Ambulation/Gait assistance: Min assist Gait Distance (Feet): 60 Feet  (60'x1; 30'x1) Assistive device: Rolling walker (2 wheels) Gait Pattern/deviations: Step-to pattern, Antalgic       General Gait Details: Cues for safety, posture, RW proximity, technique, step lengths, sequencing. Assist to steady.   Stairs             Wheelchair Mobility    Modified Rankin (Stroke Patients Only)       Balance Overall balance assessment: Needs assistance         Standing balance support: Bilateral upper extremity supported, During functional activity, Reliant on assistive device for balance Standing balance-Leahy Scale: Poor                              Cognition Arousal/Alertness: Awake/alert Behavior During Therapy: Anxious Overall Cognitive Status: Within Functional Limits for tasks assessed                                 General Comments: Frequent cues for relaxation and taking his time. Pt often with quick movements leading to increased pain        Exercises Total Joint Exercises Ankle Circles/Pumps: AROM, Both, 10 reps Quad Sets: AROM, Left, 10 reps Heel Slides: AAROM, Left, 10 reps Hip ABduction/ADduction: AAROM, Left, 10 reps Straight Leg Raises: AAROM, Left, 10 reps    General Comments        Pertinent Vitals/Pain Pain Assessment Pain Assessment: 0-10 Pain Score: 7  Pain Location: L knee/thigh Pain Descriptors / Indicators: Aching, Discomfort, Operative site guarding Pain Intervention(s): Limited activity within patient's tolerance, Monitored during session, Ice applied, Repositioned  Home Living                          Prior Function            PT Goals (current goals can now be found in the care plan section) Progress towards PT goals: Progressing toward goals    Frequency    7X/week      PT Plan Current plan remains appropriate    Co-evaluation              AM-PAC PT "6 Clicks" Mobility   Outcome Measure  Help needed turning from your back to your side  while in a flat bed without using bedrails?: A Little Help needed moving from lying on your back to sitting on the side of a flat bed without using bedrails?: A Little Help needed moving to and from a bed to a chair (including a wheelchair)?: A Little Help needed standing up from a chair using your arms (e.g., wheelchair or bedside chair)?: A Little Help needed to walk in hospital room?: A Little Help needed climbing 3-5 steps with a railing? : A Lot 6 Click Score: 17    End of Session Equipment Utilized During Treatment: Gait belt Activity Tolerance: Patient tolerated treatment well Patient left: in chair;with call bell/phone within reach;with family/visitor present   PT Visit Diagnosis: Other abnormalities of gait and mobility (R26.89);Muscle weakness (generalized) (M62.81);Pain Pain - Right/Left: Left Pain - part of body: Knee     Time: 4098-1191 PT Time Calculation (min) (ACUTE ONLY): 35 min  Charges:  $Gait Training: 8-22 mins $Therapeutic Exercise: 8-22 mins                         Faye Ramsay, PT Acute Rehabilitation  Office: [phone]

## 2022-07-19 NOTE — Progress Notes (Signed)
Physical Therapy Treatment Patient Details Name: [name] MRN: [id number] DOB: [birth year] Today's Date: 07/19/2022   History of Present Illness Pt is 67 yo male s/p L TKA on 07/17/22.  Pt with hx including anxiety, ADD, bipolar disorder, CHF, COPD, arthritis    PT Comments    Progressing with mobility. Practiced stair negotiation with pt and wife this session. Discussion with both about having +2 assist for safety to get into home and for pt to have 24/7 supervision/assist initially for safe mobility at home. Wife reports she works in the mornings. Plan if for d/c home tomorrow afternoon once wife gets off work-they have arranged for another person to help with getting patient into home.     Recommendations for follow up therapy are one component of a multi-disciplinary discharge planning process, led by the attending physician.  Recommendations may be updated based on patient status, additional functional criteria and insurance authorization.  Follow Up Recommendations       Assistance Recommended at Discharge Frequent or constant Supervision/Assistance  Patient can return home with the following A little help with walking and/or transfers;A lot of help with bathing/dressing/bathroom;Assistance with cooking/housework;Assist for transportation;Help with stairs or ramp for entrance   Equipment Recommendations  None recommended by PT    Recommendations for Other Services       Precautions / Restrictions Precautions Precautions: Fall Restrictions Weight Bearing Restrictions: No LLE Weight Bearing: Weight bearing as tolerated     Mobility  Bed Mobility Overal bed mobility: Needs Assistance Bed Mobility: Supine to Sit     Supine to sit: Min guard, HOB elevated     General bed mobility comments: oob in recliner    Transfers Overall transfer level: Needs assistance Equipment used: Rolling walker (2 wheels) Transfers: Sit to/from Stand Sit to Stand: Min assist            General transfer comment: Assist to power up, stabilize, control descent. Cues for safety, technique, hand/LE placement. Increased time.    Ambulation/Gait Ambulation/Gait assistance: Min assist Gait Distance (Feet): 40 Feet Assistive device: Rolling walker (2 wheels) Gait Pattern/deviations: Step-to pattern, Antalgic       General Gait Details: Cues for safety, posture, RW proximity, technique, step lengths, sequencing. Assist to steady.   Stairs Stairs: Yes Stairs assistance: Min assist, +2 safety/equipment Stair Management: Step to pattern, Backwards, With walker Number of Stairs: 2 (x2) General stair comments: Cues for safety, technique, sequence.Wife present to observe and practice with patient. 1 person assist to stabilize walker and 1 person Min guard standing behind pt for safety.   Wheelchair Mobility    Modified Rankin (Stroke Patients Only)       Balance Overall balance assessment: Needs assistance         Standing balance support: Bilateral upper extremity supported, During functional activity, Reliant on assistive device for balance Standing balance-Leahy Scale: Poor                              Cognition Arousal/Alertness: Awake/alert Behavior During Therapy: Anxious Overall Cognitive Status: Within Functional Limits for tasks assessed                                 General Comments: Frequent cues for relaxation and taking his time. Pt often with quick movements leading to increased pain        Exercises Total Joint Exercises  Ankle Circles/Pumps: AROM, Both, 10 reps Quad Sets: AROM, Left, 10 reps Heel Slides: AAROM, Left, 10 reps Hip ABduction/ADduction: AAROM, Left, 10 reps Straight Leg Raises: AAROM, Left, 10 reps    General Comments        Pertinent Vitals/Pain Pain Assessment Pain Assessment: 0-10 Pain Score: 7  Pain Location: L knee/thigh Pain Descriptors / Indicators: Aching, Discomfort, Operative  site guarding Pain Intervention(s): Monitored during session, Limited activity within patient's tolerance, Repositioned    Home Living                          Prior Function            PT Goals (current goals can now be found in the care plan section) Progress towards PT goals: Progressing toward goals    Frequency    7X/week      PT Plan Current plan remains appropriate    Co-evaluation              AM-PAC PT "6 Clicks" Mobility   Outcome Measure  Help needed turning from your back to your side while in a flat bed without using bedrails?: A Little Help needed moving from lying on your back to sitting on the side of a flat bed without using bedrails?: A Little Help needed moving to and from a bed to a chair (including a wheelchair)?: A Little Help needed standing up from a chair using your arms (e.g., wheelchair or bedside chair)?: A Little Help needed to walk in hospital room?: A Little Help needed climbing 3-5 steps with a railing? : A Lot 6 Click Score: 17    End of Session Equipment Utilized During Treatment: Gait belt Activity Tolerance: Patient tolerated treatment well Patient left: in chair;with call bell/phone within reach;with chair alarm set;with family/visitor present   PT Visit Diagnosis: Other abnormalities of gait and mobility (R26.89);Muscle weakness (generalized) (M62.81);Pain Pain - Right/Left: Left Pain - part of body: Knee     Time: 2130-8657 PT Time Calculation (min) (ACUTE ONLY): 27 min  Charges:  $Gait Training: 23-37 mins $Therapeutic Exercise: 8-22 mins                         Faye Ramsay, PT Acute Rehabilitation  Office: [phone]

## 2022-07-19 NOTE — Progress Notes (Signed)
Patient ID: [name], male   DOB: 11-Sep-1955, 67 y.o.   MRN: [id number] Subjective: 2 Days Post-Op Procedure(s) (LRB): TOTAL KNEE ARTHROPLASTY (Left)    Patient reports pain as moderate. Limited therapy at this point due to pain control issues - reviewed importance despite pain  Objective:   VITALS:   Vitals:   07/18/22 2107 07/19/22 0621  BP: (!) 154/87 (!) 158/88  Pulse: 82 75  Resp: 18 20  Temp: 98.6 F (37 C) 98.3 F (36.8 C)  SpO2: 96% 97%    Neurovascular intact Incision: dressing C/D/I post op dressing removed, aqualcell applied  LABS No results for input(s): "HGB", "HCT", "WBC", "PLT" in the last 72 hours.  No results for input(s): "NA", "K", "BUN", "CREATININE", "GLUCOSE" in the last 72 hours.  No results for input(s): "LABPT", "INR" in the last 72 hours.   Assessment/Plan: 2 Days Post-Op Procedure(s) (LRB): TOTAL KNEE ARTHROPLASTY (Left)   Up with therapy Due to pain related concerns it is not likely that he will be discharged to home today If this changes let us know

## 2022-07-20 NOTE — Progress Notes (Signed)
Discharge package printed and reviewed with patient. Patient verbalizes understanding. 

## 2022-07-20 NOTE — Discharge Summary (Signed)
In most cases prophylactic antibiotics for Dental procdeures after total joint surgery are not necessary.  Exceptions are as follows:  1. History of prior total joint infection  2. Severely immunocompromised (Organ Transplant, cancer chemotherapy, Rheumatoid biologic meds such as Humera)  3. Poorly controlled diabetes (A1C &gt; 8.0, blood glucose over 200)  If you have one of these conditions, contact your surgeon for an antibiotic prescription, prior to your dental procedure. Orthopedic Discharge Summary        Physician Discharge Summary  Patient ID: [name] MRN: [id number] DOB/AGE: November 05, 1955 67 y.o.  Admit date: 07/17/2022 Discharge date: 07/20/2022   Procedures:  Procedure(s) (LRB): TOTAL KNEE ARTHROPLASTY (Left)  Attending Physician:  Dr. Malon Kindle  Admission Diagnoses:   Left knee end stage OA  Discharge Diagnoses:  same   Past Medical History:  Diagnosis Date   ADD (attention deficit disorder)    Anxiety    Panic   Arthritis    Bipolar disorder (HCC)    CHF (congestive heart failure) (HCC)    COPD (chronic obstructive pulmonary disease) (HCC)    Depression    GERD (gastroesophageal reflux disease)    ? they thought GERD caused cough.    Hypertension    Mental disorder    ADD, bipolar   Pneumonia 04/12/2013   Shortness of breath    Sleep apnea    cpap   Substance abuse (HCC)     PCP: Lorenda Ishihara, MD   Discharged Condition: good  Hospital Course:  Patient underwent the above stated procedure on 07/17/2022. Patient tolerated the procedure well and brought to the recovery room in good condition and subsequently to the floor. Patient had an uncomplicated hospital course and was stable for discharge.   Disposition: Discharge disposition: 01-Home or Self Care      with follow up in 2 weeks    Follow-up Information     Beverely Low, MD. Call in 2 week(s).   Specialty: Orthopedic Surgery Why: call [phone] for  appt Contact information: [address] [place] [address] [phone]                 Dental Antibiotics:  In most cases prophylactic antibiotics for Dental procdeures after total joint surgery are not necessary.  Exceptions are as follows:  1. History of prior total joint infection  2. Severely immunocompromised (Organ Transplant, cancer chemotherapy, Rheumatoid biologic meds such as Humera)  3. Poorly controlled diabetes (A1C &gt; 8.0, blood glucose over 200)  If you have one of these conditions, contact your surgeon for an antibiotic prescription, prior to your dental procedure.  Discharge Instructions     Call MD / Call 911   Complete by: As directed    If you experience chest pain or shortness of breath, CALL 911 and be transported to the hospital emergency room.  If you develope a fever above 101 F, pus (white drainage) or increased drainage or redness at the wound, or calf pain, call your surgeon's office.   Constipation Prevention   Complete by: As directed    Drink plenty of fluids.  Prune juice may be helpful.  You may use a stool softener, such as Colace (over the counter) 100 mg twice a day.  Use MiraLax (over the counter) for constipation as needed.   Diet - low sodium heart healthy   Complete by: As directed    Increase activity slowly as tolerated   Complete by: As directed  Post-operative opioid taper instructions:   Complete by: As directed    POST-OPERATIVE OPIOID TAPER INSTRUCTIONS: It is important to wean off of your opioid medication as soon as possible. If you do not need pain medication after your surgery it is ok to stop day one. Opioids include: Codeine, Hydrocodone(Norco, Vicodin), Oxycodone(Percocet, oxycontin) and hydromorphone amongst others.  Long term and even short term use of opiods can cause: Increased pain response Dependence Constipation Depression Respiratory depression And more.  Withdrawal symptoms  can include Flu like symptoms Nausea, vomiting And more Techniques to manage these symptoms Hydrate well Eat regular healthy meals Stay active Use relaxation techniques(deep breathing, meditating, yoga) Do Not substitute Alcohol to help with tapering If you have been on opioids for less than two weeks and do not have pain than it is ok to stop all together.  Plan to wean off of opioids This plan should start within one week post op of your joint replacement. Maintain the same interval or time between taking each dose and first decrease the dose.  Cut the total daily intake of opioids by one tablet each day Next start to increase the time between doses. The last dose that should be eliminated is the evening dose.          Allergies as of 07/20/2022       Reactions   Omeprazole Diarrhea, Nausea And Vomiting   Topiramate    "Made me crazy"        Medication List     TAKE these medications    albuterol 108 (90 Base) MCG/ACT inhaler Commonly known as: ProAir HFA Inhale 2 puffs into the lungs every 6 (six) hours as needed for wheezing or shortness of breath.   albuterol (2.5 MG/3ML) 0.083% nebulizer solution Commonly known as: PROVENTIL Take 3 mLs (2.5 mg total) by nebulization every 6 (six) hours as needed for wheezing or shortness of breath.   aspirin 81 MG chewable tablet Commonly known as: Aspirin Childrens Chew 1 tablet (81 mg total) by mouth 2 (two) times daily.   atorvastatin 10 MG tablet Commonly known as: LIPITOR Take 10 mg by mouth daily.   chlorpheniramine-HYDROcodone 10-8 MG/5ML Commonly known as: Tussionex Pennkinetic ER Take 5 mLs by mouth every 12 (twelve) hours as needed for cough.   escitalopram 10 MG tablet Commonly known as: LEXAPRO Take 10 mg by mouth daily.   furosemide 40 MG tablet Commonly known as: LASIX Take 40 mg by mouth daily as needed for fluid.   gabapentin 800 MG tablet Commonly known as: NEURONTIN Take 800 mg by mouth 3  (three) times daily.   gabapentin 300 MG capsule Commonly known as: NEURONTIN Take 2 capsules (600 mg total) by mouth 3 (three) times daily. For agitation   hydrOXYzine 25 MG capsule Commonly known as: VISTARIL Take 1 capsule by mouth daily.   lamoTRIgine 100 MG tablet Commonly known as: LAMICTAL Take 100 mg by mouth 2 (two) times daily.   losartan 100 MG tablet Commonly known as: COZAAR TAKE 1 TABLET BY MOUTH DAILY.   melatonin 5 MG Tabs Take 1 tablet (5 mg total) by mouth at bedtime. For sleep What changed: how much to take   methocarbamol 500 MG tablet Commonly known as: ROBAXIN Take 1 tablet (500 mg total) by mouth every 8 (eight) hours as needed for muscle spasms.   multivitamin with minerals Tabs tablet Take 1 tablet by mouth daily.   nitroGLYCERIN 0.4 MG SL tablet Commonly known as: NITROSTAT Place 1  tablet (0.4 mg total) under the tongue every 5 (five) minutes as needed for chest pain.   ondansetron 4 MG tablet Commonly known as: Zofran Take 1 tablet (4 mg total) by mouth every 8 (eight) hours as needed for vomiting, refractory nausea / vomiting or nausea.   oxyCODONE 5 MG immediate release tablet Commonly known as: Roxicodone Take 1 tablet (5 mg total) by mouth every 4 (four) hours as needed for severe pain or breakthrough pain.   predniSONE 20 MG tablet Commonly known as: DELTASONE Take 2 tablets (40 mg total) by mouth daily.   spironolactone 25 MG tablet Commonly known as: ALDACTONE Take 1 tablet (25 mg total) by mouth every morning.   telmisartan 20 MG tablet Commonly known as: MICARDIS Take 20 mg by mouth daily.   traMADol 50 MG tablet Commonly known as: ULTRAM Take 100 mg by mouth in the morning, at noon, in the evening, and at bedtime.   traZODone 100 MG tablet Commonly known as: DESYREL Take 100 mg by mouth at bedtime as needed for sleep.          Signed: Verlee Rossetti 07/20/2022, 6:45 AM  [facility] is now  [place] [employer] [place] [address], [place], [place] [address] Phone: [phone] Facebook  Instagram  [employer]

## 2022-07-20 NOTE — Plan of Care (Signed)
  Problem: Activity: Goal: Ability to avoid complications of mobility impairment will improve Outcome: Progressing Goal: Range of joint motion will improve Outcome: Progressing   Problem: Pain Management: Goal: Pain level will decrease with appropriate interventions Outcome: Progressing   Problem: Safety: Goal: Ability to remain free from injury will improve Outcome: Progressing   

## 2022-07-20 NOTE — Progress Notes (Signed)
Physical Therapy Treatment Patient Details Name: [name] MRN: [id number] DOB: Nov 24, 1955 Today's Date: 07/20/2022   History of Present Illness Pt is 67 yo male s/p L TKA on 07/17/22.  Pt with hx including anxiety, ADD, bipolar disorder, CHF, COPD, arthritis    PT Comments    Pt agreeable to working with therapy. Pain rated 7/10 during session. Reviewed/practiced gait and stair training. Encouraged pt to try to ambulate every hour as safely able. Discussed with pt and wife on yesterday that 24/7 supervision/assist is recommended for safety. Also discussed with pt and wife that +2 assist is recommended for stair negotiation safety. All PT education completed. MD has discharged pt on today.     Recommendations for follow up therapy are one component of a multi-disciplinary discharge planning process, led by the attending physician.  Recommendations may be updated based on patient status, additional functional criteria and insurance authorization.  Follow Up Recommendations       Assistance Recommended at Discharge Frequent or constant Supervision/Assistance  Patient can return home with the following A little help with walking and/or transfers;A lot of help with bathing/dressing/bathroom;Assistance with cooking/housework;Assist for transportation;Help with stairs or ramp for entrance   Equipment Recommendations  None recommended by PT    Recommendations for Other Services       Precautions / Restrictions Precautions Precautions: Fall Restrictions Weight Bearing Restrictions: No LLE Weight Bearing: Weight bearing as tolerated     Mobility  Bed Mobility Overal bed mobility: Needs Assistance Bed Mobility: Supine to Sit     Supine to sit: Supervision, HOB elevated     General bed mobility comments: Supv for safety.    Transfers Overall transfer level: Needs assistance Equipment used: Rolling walker (2 wheels) Transfers: Sit to/from Stand Sit to Stand: Min guard            General transfer comment: Min guard for safety. Cues for safety, technique, hand/LE placement.    Ambulation/Gait Ambulation/Gait assistance: Min guard Gait Distance (Feet): 40 Feet Assistive device: Rolling walker (2 wheels) Gait Pattern/deviations: Step-to pattern       General Gait Details: Min guard for safety. Cues for safety, technique, sequence.   Stairs Stairs: Yes Stairs assistance: Min assist, +2 safety/equipment Stair Management: Step to pattern, Backwards, With walker Number of Stairs: 2 (x2) General stair comments: Cues for safety, technique, sequence. Continue to recommend +2 assist for safety (1 person to steady pt, 1 person to stabilize walker).   Wheelchair Mobility    Modified Rankin (Stroke Patients Only)       Balance Overall balance assessment: Needs assistance         Standing balance support: Bilateral upper extremity supported, During functional activity, Reliant on assistive device for balance Standing balance-Leahy Scale: Poor                              Cognition Arousal/Alertness: Awake/alert Behavior During Therapy: Anxious Overall Cognitive Status: Within Functional Limits for tasks assessed                                 General Comments: Frequent cues for relaxation and taking his time. Pt often with quick movements leading to increased pain and panic        Exercises Total Joint Exercises Ankle Circles/Pumps: AROM, Both, 10 reps Quad Sets: AROM, Left, 10 reps Heel Slides: AAROM, Left, 10 reps  Hip ABduction/ADduction: AAROM, Left, 10 reps Straight Leg Raises: AAROM, Left, 10 reps    General Comments        Pertinent Vitals/Pain Pain Assessment Pain Assessment: 0-10 Pain Score: 7  Pain Location: L knee/thigh Pain Descriptors / Indicators: Aching, Discomfort, Operative site guarding Pain Intervention(s): Monitored during session, Ice applied, Repositioned    Home Living                           Prior Function            PT Goals (current goals can now be found in the care plan section) Progress towards PT goals: Progressing toward goals    Frequency    7X/week      PT Plan Current plan remains appropriate    Co-evaluation              AM-PAC PT "6 Clicks" Mobility   Outcome Measure  Help needed turning from your back to your side while in a flat bed without using bedrails?: A Little Help needed moving from lying on your back to sitting on the side of a flat bed without using bedrails?: A Little Help needed moving to and from a bed to a chair (including a wheelchair)?: A Little Help needed standing up from a chair using your arms (e.g., wheelchair or bedside chair)?: A Little Help needed to walk in hospital room?: A Little Help needed climbing 3-5 steps with a railing? : A Lot 6 Click Score: 17    End of Session Equipment Utilized During Treatment: Gait belt;Knee immobilizer (reviewed proper donning with wife on 6/2) Activity Tolerance: Patient tolerated treatment well Patient left: in chair;with call bell/phone within reach;with chair alarm set;with family/visitor present   PT Visit Diagnosis: Other abnormalities of gait and mobility (R26.89);Muscle weakness (generalized) (M62.81);Pain Pain - Right/Left: Left Pain - part of body: Knee     Time: 1610-9604 PT Time Calculation (min) (ACUTE ONLY): 22 min  Charges:  $Gait Training: 8-22 mins                         Faye Ramsay, PT Acute Rehabilitation  Office: [phone]

## 2022-07-20 NOTE — Progress Notes (Signed)
Orthopedics Progress Note  Subjective: Pain better this AM. Patient ready to head home today  Objective:  Vitals:   07/19/22 2155 07/20/22 0628  BP: (!) 155/92 (!) 151/93  Pulse: 80 80  Resp: 17 18  Temp: 99.7 F (37.6 C) 98.9 F (37.2 C)  SpO2: 94% 95%    General: Awake and alert  Musculoskeletal: Left knee with Aquacel in place. Moderate swelling. Neg Homans, No erythema Good quad set Neurovascularly intact  Lab Results  Component Value Date   WBC 7.3 07/07/2022   HGB 14.3 07/07/2022   HCT 43.0 07/07/2022   MCV 93.3 07/07/2022   PLT 230 07/07/2022       Component Value Date/Time   NA 134 (L) 07/07/2022 1030   NA 140 07/18/2018 1106   K 4.6 07/07/2022 1030   CL 103 07/07/2022 1030   CO2 24 07/07/2022 1030   GLUCOSE 137 (H) 07/07/2022 1030   BUN 23 07/07/2022 1030   BUN 21 07/18/2018 1106   CREATININE 1.14 07/07/2022 1030   CREATININE 1.00 11/23/2013 1012   CALCIUM 9.0 07/07/2022 1030   GFRNONAA >60 07/07/2022 1030   GFRAA 80 07/18/2018 1106    Lab Results  Component Value Date   INR 0.99 11/24/2013   INR 1.02 10/13/2013    Assessment/Plan: POD #3  s/p Procedure(s): TOTAL KNEE ARTHROPLASTY Stable this AM after TKA. Home today after therapy this AM. Follow up in two weeks in the office  Viviann Spare R. Ranell Patrick, MD 07/20/2022 6:42 AM

## 2022-07-20 NOTE — Progress Notes (Signed)
Pt is waiting on his ride. Will be here 1330.

## 2022-07-21 DIAGNOSIS — R269 Unspecified abnormalities of gait and mobility: Secondary | ICD-10-CM | POA: Diagnosis not present

## 2022-07-21 DIAGNOSIS — M25562 Pain in left knee: Secondary | ICD-10-CM | POA: Diagnosis not present

## 2022-07-24 DIAGNOSIS — R269 Unspecified abnormalities of gait and mobility: Secondary | ICD-10-CM | POA: Diagnosis not present

## 2022-07-24 DIAGNOSIS — M25562 Pain in left knee: Secondary | ICD-10-CM | POA: Diagnosis not present

## 2022-07-27 DIAGNOSIS — Z6838 Body mass index (BMI) 38.0-38.9, adult: Secondary | ICD-10-CM | POA: Diagnosis not present

## 2022-07-27 DIAGNOSIS — F54 Psychological and behavioral factors associated with disorders or diseases classified elsewhere: Secondary | ICD-10-CM | POA: Diagnosis not present

## 2022-07-27 DIAGNOSIS — E669 Obesity, unspecified: Secondary | ICD-10-CM | POA: Diagnosis not present

## 2022-07-28 DIAGNOSIS — R269 Unspecified abnormalities of gait and mobility: Secondary | ICD-10-CM | POA: Diagnosis not present

## 2022-07-28 DIAGNOSIS — M25562 Pain in left knee: Secondary | ICD-10-CM | POA: Diagnosis not present

## 2022-07-29 DIAGNOSIS — Z4789 Encounter for other orthopedic aftercare: Secondary | ICD-10-CM | POA: Diagnosis not present

## 2022-07-31 DIAGNOSIS — H5711 Ocular pain, right eye: Secondary | ICD-10-CM | POA: Diagnosis not present

## 2022-08-04 DIAGNOSIS — R269 Unspecified abnormalities of gait and mobility: Secondary | ICD-10-CM | POA: Diagnosis not present

## 2022-08-04 DIAGNOSIS — M25562 Pain in left knee: Secondary | ICD-10-CM | POA: Diagnosis not present

## 2022-08-10 DIAGNOSIS — E669 Obesity, unspecified: Secondary | ICD-10-CM | POA: Diagnosis not present

## 2022-08-10 DIAGNOSIS — Z6838 Body mass index (BMI) 38.0-38.9, adult: Secondary | ICD-10-CM | POA: Diagnosis not present

## 2022-08-10 DIAGNOSIS — Z713 Dietary counseling and surveillance: Secondary | ICD-10-CM | POA: Diagnosis not present

## 2022-08-12 DIAGNOSIS — M25562 Pain in left knee: Secondary | ICD-10-CM | POA: Diagnosis not present

## 2022-08-12 DIAGNOSIS — R269 Unspecified abnormalities of gait and mobility: Secondary | ICD-10-CM | POA: Diagnosis not present

## 2022-08-14 DIAGNOSIS — R269 Unspecified abnormalities of gait and mobility: Secondary | ICD-10-CM | POA: Diagnosis not present

## 2022-08-14 DIAGNOSIS — M25562 Pain in left knee: Secondary | ICD-10-CM | POA: Diagnosis not present

## 2022-08-17 DIAGNOSIS — R269 Unspecified abnormalities of gait and mobility: Secondary | ICD-10-CM | POA: Diagnosis not present

## 2022-08-17 DIAGNOSIS — M25562 Pain in left knee: Secondary | ICD-10-CM | POA: Diagnosis not present

## 2022-08-19 DIAGNOSIS — M25562 Pain in left knee: Secondary | ICD-10-CM | POA: Diagnosis not present

## 2022-08-19 DIAGNOSIS — R269 Unspecified abnormalities of gait and mobility: Secondary | ICD-10-CM | POA: Diagnosis not present

## 2022-08-24 DIAGNOSIS — M25562 Pain in left knee: Secondary | ICD-10-CM | POA: Diagnosis not present

## 2022-08-24 DIAGNOSIS — R269 Unspecified abnormalities of gait and mobility: Secondary | ICD-10-CM | POA: Diagnosis not present

## 2022-08-26 DIAGNOSIS — M25562 Pain in left knee: Secondary | ICD-10-CM | POA: Diagnosis not present

## 2022-08-26 DIAGNOSIS — R269 Unspecified abnormalities of gait and mobility: Secondary | ICD-10-CM | POA: Diagnosis not present

## 2022-09-01 DIAGNOSIS — E669 Obesity, unspecified: Secondary | ICD-10-CM | POA: Diagnosis not present

## 2022-09-01 DIAGNOSIS — I110 Hypertensive heart disease with heart failure: Secondary | ICD-10-CM | POA: Diagnosis not present

## 2022-09-01 DIAGNOSIS — I5030 Unspecified diastolic (congestive) heart failure: Secondary | ICD-10-CM | POA: Diagnosis not present

## 2022-09-01 DIAGNOSIS — Z6838 Body mass index (BMI) 38.0-38.9, adult: Secondary | ICD-10-CM | POA: Diagnosis not present

## 2022-09-03 DIAGNOSIS — R269 Unspecified abnormalities of gait and mobility: Secondary | ICD-10-CM | POA: Diagnosis not present

## 2022-09-03 DIAGNOSIS — M25562 Pain in left knee: Secondary | ICD-10-CM | POA: Diagnosis not present

## 2022-09-08 DIAGNOSIS — Z96652 Presence of left artificial knee joint: Secondary | ICD-10-CM | POA: Diagnosis not present

## 2022-09-08 DIAGNOSIS — R269 Unspecified abnormalities of gait and mobility: Secondary | ICD-10-CM | POA: Diagnosis not present

## 2022-09-08 DIAGNOSIS — M25562 Pain in left knee: Secondary | ICD-10-CM | POA: Diagnosis not present

## 2022-09-08 DIAGNOSIS — R531 Weakness: Secondary | ICD-10-CM | POA: Diagnosis not present

## 2022-09-15 DIAGNOSIS — R531 Weakness: Secondary | ICD-10-CM | POA: Diagnosis not present

## 2022-09-15 DIAGNOSIS — M25562 Pain in left knee: Secondary | ICD-10-CM | POA: Diagnosis not present

## 2022-09-15 DIAGNOSIS — R269 Unspecified abnormalities of gait and mobility: Secondary | ICD-10-CM | POA: Diagnosis not present

## 2022-09-15 DIAGNOSIS — Z96652 Presence of left artificial knee joint: Secondary | ICD-10-CM | POA: Diagnosis not present

## 2022-09-15 DIAGNOSIS — F1021 Alcohol dependence, in remission: Secondary | ICD-10-CM | POA: Diagnosis not present

## 2022-09-21 DIAGNOSIS — G894 Chronic pain syndrome: Secondary | ICD-10-CM | POA: Diagnosis not present

## 2022-09-21 DIAGNOSIS — Z79899 Other long term (current) drug therapy: Secondary | ICD-10-CM | POA: Diagnosis not present

## 2022-09-22 DIAGNOSIS — R269 Unspecified abnormalities of gait and mobility: Secondary | ICD-10-CM | POA: Diagnosis not present

## 2022-09-22 DIAGNOSIS — R531 Weakness: Secondary | ICD-10-CM | POA: Diagnosis not present

## 2022-09-22 DIAGNOSIS — M25562 Pain in left knee: Secondary | ICD-10-CM | POA: Diagnosis not present

## 2022-09-22 DIAGNOSIS — Z96652 Presence of left artificial knee joint: Secondary | ICD-10-CM | POA: Diagnosis not present

## 2022-09-25 DIAGNOSIS — E669 Obesity, unspecified: Secondary | ICD-10-CM | POA: Diagnosis not present

## 2022-09-25 DIAGNOSIS — Z6838 Body mass index (BMI) 38.0-38.9, adult: Secondary | ICD-10-CM | POA: Diagnosis not present

## 2022-09-25 DIAGNOSIS — Z713 Dietary counseling and surveillance: Secondary | ICD-10-CM | POA: Diagnosis not present

## 2022-10-06 ENCOUNTER — Emergency Department (HOSPITAL_BASED_OUTPATIENT_CLINIC_OR_DEPARTMENT_OTHER): Payer: Medicare Other | Admitting: Radiology

## 2022-10-06 ENCOUNTER — Encounter (HOSPITAL_BASED_OUTPATIENT_CLINIC_OR_DEPARTMENT_OTHER): Payer: Self-pay

## 2022-10-06 ENCOUNTER — Emergency Department (HOSPITAL_BASED_OUTPATIENT_CLINIC_OR_DEPARTMENT_OTHER)
Admission: EM | Admit: 2022-10-06 | Discharge: 2022-10-06 | Disposition: A | Discharge status: Home / Self Care | Payer: Medicare Other | Attending: Emergency Medicine | Admitting: Emergency Medicine

## 2022-10-06 ENCOUNTER — Emergency Department (HOSPITAL_BASED_OUTPATIENT_CLINIC_OR_DEPARTMENT_OTHER): Payer: Medicare Other

## 2022-10-06 ENCOUNTER — Other Ambulatory Visit: Payer: Self-pay

## 2022-10-06 DIAGNOSIS — J449 Chronic obstructive pulmonary disease, unspecified: Secondary | ICD-10-CM | POA: Insufficient documentation

## 2022-10-06 DIAGNOSIS — I5030 Unspecified diastolic (congestive) heart failure: Secondary | ICD-10-CM | POA: Diagnosis not present

## 2022-10-06 DIAGNOSIS — R0602 Shortness of breath: Secondary | ICD-10-CM | POA: Insufficient documentation

## 2022-10-06 DIAGNOSIS — I440 Atrioventricular block, first degree: Secondary | ICD-10-CM | POA: Diagnosis not present

## 2022-10-06 DIAGNOSIS — R42 Dizziness and giddiness: Secondary | ICD-10-CM | POA: Insufficient documentation

## 2022-10-06 DIAGNOSIS — R55 Syncope and collapse: Secondary | ICD-10-CM | POA: Diagnosis not present

## 2022-10-06 DIAGNOSIS — I110 Hypertensive heart disease with heart failure: Secondary | ICD-10-CM | POA: Diagnosis not present

## 2022-10-06 DIAGNOSIS — Z1152 Encounter for screening for COVID-19: Secondary | ICD-10-CM | POA: Diagnosis not present

## 2022-10-06 DIAGNOSIS — Z79899 Other long term (current) drug therapy: Secondary | ICD-10-CM | POA: Diagnosis not present

## 2022-10-06 DIAGNOSIS — Z7951 Long term (current) use of inhaled steroids: Secondary | ICD-10-CM | POA: Insufficient documentation

## 2022-10-06 LAB — CBC
HCT: 40 % (ref 39.0–52.0)
Hemoglobin: 13.4 g/dL (ref 13.0–17.0)
MCH: 30.7 pg (ref 26.0–34.0)
MCHC: 33.5 g/dL (ref 30.0–36.0)
MCV: 91.7 fL (ref 80.0–100.0)
Platelets: 243 10*3/uL (ref 150–400)
RBC: 4.36 MIL/uL (ref 4.22–5.81)
RDW: 12.7 % (ref 11.5–15.5)
WBC: 6.4 10*3/uL (ref 4.0–10.5)
nRBC: 0 % (ref 0.0–0.2)

## 2022-10-06 LAB — URINALYSIS, ROUTINE W REFLEX MICROSCOPIC
Bacteria, UA: NONE SEEN
Bilirubin Urine: NEGATIVE
Glucose, UA: >1000 mg/dL — AB
Hgb urine dipstick: NEGATIVE
Ketones, ur: NEGATIVE mg/dL
Leukocytes,Ua: NEGATIVE
Nitrite: NEGATIVE
Protein, ur: NEGATIVE mg/dL
Specific Gravity, Urine: 1.025 (ref 1.005–1.030)
pH: 6 (ref 5.0–8.0)

## 2022-10-06 LAB — TROPONIN I (HIGH SENSITIVITY): Troponin I (High Sensitivity): 7 ng/L (ref <18)

## 2022-10-06 LAB — CBG MONITORING, ED: Glucose-Capillary: 122 mg/dL — ABNORMAL HIGH (ref 70–99)

## 2022-10-06 LAB — D-DIMER, QUANTITATIVE: D-Dimer, Quant: 1.75 ug{FEU}/mL — ABNORMAL HIGH (ref 0.00–0.50)

## 2022-10-06 LAB — BASIC METABOLIC PANEL
Anion gap: 7 (ref 5–15)
BUN: 23 mg/dL (ref 8–23)
CO2: 25 mmol/L (ref 22–32)
Calcium: 10 mg/dL (ref 8.9–10.3)
Chloride: 102 mmol/L (ref 98–111)
Creatinine, Ser: 0.98 mg/dL (ref 0.61–1.24)
GFR, Estimated: >60 mL/min (ref >60)
Glucose, Bld: 99 mg/dL (ref 70–99)
Potassium: 4.4 mmol/L (ref 3.5–5.1)
Sodium: 134 mmol/L — ABNORMAL LOW (ref 135–145)

## 2022-10-06 LAB — BRAIN NATRIURETIC PEPTIDE: B Natriuretic Peptide: 20.4 pg/mL (ref 0.0–100.0)

## 2022-10-06 LAB — SARS CORONAVIRUS 2 BY RT PCR: SARS Coronavirus 2 by RT PCR: NEGATIVE

## 2022-10-06 MED ORDER — IOHEXOL 350 MG/ML SOLN
75.0000 mL | Freq: Once | INTRAVENOUS | Status: AC | PRN
  Administered 2022-10-06: 75 mL via INTRAVENOUS

## 2022-10-06 NOTE — ED Triage Notes (Signed)
Pt to ED c/o dizziness and SHOB x 2 weeks, Progressively getting worse., Reports hx of CHF and COPD. No known sick contacts

## 2022-10-06 NOTE — ED Notes (Signed)
Patient is hooked up to monitor resting in bed at this time. Urine was sent to lab.

## 2022-10-06 NOTE — Discharge Instructions (Signed)
Your workup today was reassuring.  I recommend you follow-up with your cardiologist as soon as you can and your primary care provider.  If you develop chest pain, worsening shortness of breath, fainting you should return to the ED.

## 2022-10-06 NOTE — ED Notes (Signed)
 RN reviewed discharge instructions with pt. Pt verbalized understanding and had no further questions. VSS upon discharge.  

## 2022-10-06 NOTE — ED Provider Notes (Signed)
[facility] AT [facility] Provider Note   CSN: [id number] Arrival date & time: 10/06/22  1300     History {Add pertinent medical, surgical, social history, OB history to HPI:1} Chief Complaint  Patient presents with   Shortness of Breath   Dizziness    [name] is a 67 y.o. male.   Shortness of Breath Dizziness Associated symptoms: shortness of breath   67 year old male with history of diastolic heart failure, COPD, GERD, hypertension presenting for shortness of breath.  He says over the last few weeks has had an occasional presyncope with standing as well as shortness of breath.  Occasionally some chest tightness with breathing although no frank chest pain.  No lower extremity edema or orthopnea.  He stopped his torsemide recently after being told by his doctor to his dizziness, and tried to decrease his spironolactone but this did not help.  He has not had any fevers or chills, he has chronic cough which is unchanged.  No new wheezing.  He has had any sick contacts or fevers.  Eating and drinking normally, no vomiting or diarrhea.     Home Medications Prior to Admission medications   Medication Sig Start Date End Date Taking? Authorizing Provider  albuterol (PROAIR HFA) 108 (90 Base) MCG/ACT inhaler Inhale 2 puffs into the lungs every 6 (six) hours as needed for wheezing or shortness of breath. 09/09/17   Armandina Stammer I, NP  albuterol (PROVENTIL) (2.5 MG/3ML) 0.083% nebulizer solution Take 3 mLs (2.5 mg total) by nebulization every 6 (six) hours as needed for wheezing or shortness of breath. 09/09/17   Armandina Stammer I, NP  atorvastatin (LIPITOR) 10 MG tablet Take 10 mg by mouth daily.    [provider]  escitalopram (LEXAPRO) 10 MG tablet Take 10 mg by mouth daily.    [provider]  furosemide (LASIX) 40 MG tablet Take 40 mg by mouth daily as needed for fluid.    [provider]  gabapentin (NEURONTIN) 300 MG capsule  Take 2 capsules (600 mg total) by mouth 3 (three) times daily. For agitation Patient not taking: Reported on 07/03/2022 09/09/17   Armandina Stammer I, NP  gabapentin (NEURONTIN) 800 MG tablet Take 800 mg by mouth 3 (three) times daily.    [provider]  hydrOXYzine (VISTARIL) 25 MG capsule Take 1 capsule by mouth daily. 07/19/18   [provider]  lamoTRIgine (LAMICTAL) 100 MG tablet Take 100 mg by mouth 2 (two) times daily.    [provider]  losartan (COZAAR) 100 MG tablet TAKE 1 TABLET BY MOUTH DAILY. Patient not taking: Reported on 07/03/2022 03/29/18   Shelva Majestic, MD  Melatonin 5 MG TABS Take 1 tablet (5 mg total) by mouth at bedtime. For sleep Patient taking differently: Take 10 mg by mouth at bedtime. For sleep 09/09/17   Armandina Stammer I, NP  methocarbamol (ROBAXIN) 500 MG tablet Take 1 tablet (500 mg total) by mouth every 8 (eight) hours as needed for muscle spasms. 07/17/22   Beverely Low, MD  Multiple Vitamin (MULTIVITAMIN WITH MINERALS) TABS tablet Take 1 tablet by mouth daily.    [provider]  nitroGLYCERIN (NITROSTAT) 0.4 MG SL tablet Place 1 tablet (0.4 mg total) under the tongue every 5 (five) minutes as needed for chest pain. 07/26/18 07/03/22  Yates Decamp, MD  ondansetron (ZOFRAN) 4 MG tablet Take 1 tablet (4 mg total) by mouth every 8 (eight) hours as needed for vomiting, refractory nausea /  vomiting or nausea. 07/17/22 07/17/23  Beverely Low, MD  oxyCODONE (ROXICODONE) 5 MG immediate release tablet Take 1 tablet (5 mg total) by mouth every 4 (four) hours as needed for severe pain or breakthrough pain. 07/17/22   Beverely Low, MD  predniSONE (DELTASONE) 20 MG tablet Take 2 tablets (40 mg total) by mouth daily. Patient not taking: Reported on 07/03/2022 03/29/21   Renne Crigler, PA-C  spironolactone (ALDACTONE) 25 MG tablet Take 1 tablet (25 mg total) by mouth every morning. 08/22/18 07/03/22  Yates Decamp, MD  telmisartan (MICARDIS) 20 MG tablet Take 20 mg  by mouth daily.    [provider]  traMADol (ULTRAM) 50 MG tablet Take 100 mg by mouth in the morning, at noon, in the evening, and at bedtime. 08/18/18   [provider]  traZODone (DESYREL) 100 MG tablet Take 100 mg by mouth at bedtime as needed for sleep. 08/01/18   [provider]      Allergies    Omeprazole and Topiramate    Review of Systems   Review of Systems  Respiratory:  Positive for shortness of breath.   Neurological:  Positive for dizziness.  Review of systems completed and notable as per HPI.  ROS otherwise negative.   Physical Exam Updated Vital Signs BP 103/66 (BP Location: Left Arm)   Pulse 78   Temp 98 F (36.7 C) (Oral)   Resp (!) 22   Ht 5\' 10"  (1.778 m)   Wt 113.4 kg   SpO2 97%   BMI 35.87 kg/m  Physical Exam Vitals and nursing note reviewed.  Constitutional:      General: He is not in acute distress.    Appearance: He is well-developed.  HENT:     Head: Normocephalic and atraumatic.  Eyes:     Extraocular Movements: Extraocular movements intact.     Conjunctiva/sclera: Conjunctivae normal.     Pupils: Pupils are equal, round, and reactive to light.  Neck:     Vascular: No JVD.  Cardiovascular:     Rate and Rhythm: Normal rate and regular rhythm.     Heart sounds: No murmur heard. Pulmonary:     Effort: Pulmonary effort is normal. No respiratory distress.     Breath sounds: Normal breath sounds.  Abdominal:     Palpations: Abdomen is soft.     Tenderness: There is no abdominal tenderness.  Musculoskeletal:        General: No swelling.     Cervical back: Neck supple.     Right lower leg: No tenderness. No edema.     Left lower leg: No tenderness. No edema.  Skin:    General: Skin is warm and dry.     Capillary Refill: Capillary refill takes less than 2 seconds.  Neurological:     Mental Status: He is alert.  Psychiatric:        Mood and Affect: Mood normal.     ED Results / Procedures / Treatments    Labs (all labs ordered are listed, but only abnormal results are displayed) Labs Reviewed  BASIC METABOLIC PANEL - Abnormal; Notable for the following components:      Result Value   Sodium 134 (*)    All other components within normal limits  CBG MONITORING, ED - Abnormal; Notable for the following components:   Glucose-Capillary 122 (*)    All other components within normal limits  SARS CORONAVIRUS 2 BY RT PCR  CBC  BRAIN NATRIURETIC PEPTIDE  URINALYSIS, ROUTINE  W REFLEX MICROSCOPIC  CBG MONITORING, ED    EKG None  Radiology No results found.  Procedures Procedures  {Document cardiac monitor, telemetry assessment procedure when appropriate:1}  Medications Ordered in ED Medications - No data to display  ED Course/ Medical Decision Making/ A&P   {   Click here for ABCD2, HEART and other calculatorsREFRESH Note before signing :1}                              Medical Decision Making Amount and/or Complexity of Data Reviewed Labs: ordered. Radiology: ordered.   Medical Decision Making:   [name] is a 67 y.o. male who presented to the ED today with subacute shortness of breath, dizziness.  Vital signs reviewed.  On exam he has well-appearing and in no acute distress.  Clear lungs bilaterally, no signs of wheezing or COPD exacerbation.  He appears euvolemic on exam, has had some dizziness which sounds more like presyncope rather than vertigo.  Will plan to obtain chest x-ray evaluation of possible pneumonia, pulmonary edema although BNP is normal.  Has had occasional chest tightness although no frank pain, evaluate with troponin as well as D-dimer given recent surgery for evaluation of PE.   {crccomplexity:27900} Reviewed and confirmed nursing documentation for past medical history, family history, social history.  Initial Study Results:   Laboratory  All laboratory results reviewed.  Labs notable for ***  ***EKG EKG was reviewed independently. Rate, rhythm,  axis, intervals all examined and without medically relevant abnormality. ST segments without concerns for elevations.    Radiology:  All images reviewed independently. ***Agree with radiology report at this time.      Consults: Case discussed with ***.   Reassessment and Plan:   ***    Patient's presentation is most consistent with {EM COPA:27473}     {Document critical care time when appropriate:1} {Document review of labs and clinical decision tools ie heart score, Chads2Vasc2 etc:1}  {Document your independent review of radiology images, and any outside records:1} {Document your discussion with family members, caretakers, and with consultants:1} {Document social determinants of health affecting pt's care:1} {Document your decision making why or why not admission, treatments were needed:1} Final Clinical Impression(s) / ED Diagnoses Final diagnoses:  None    Rx / DC Orders ED Discharge Orders     None

## 2022-10-07 DIAGNOSIS — R0602 Shortness of breath: Secondary | ICD-10-CM | POA: Diagnosis not present

## 2022-10-13 DIAGNOSIS — R269 Unspecified abnormalities of gait and mobility: Secondary | ICD-10-CM | POA: Diagnosis not present

## 2022-10-13 DIAGNOSIS — R531 Weakness: Secondary | ICD-10-CM | POA: Diagnosis not present

## 2022-10-13 DIAGNOSIS — Z96652 Presence of left artificial knee joint: Secondary | ICD-10-CM | POA: Diagnosis not present

## 2022-10-13 DIAGNOSIS — F1021 Alcohol dependence, in remission: Secondary | ICD-10-CM | POA: Diagnosis not present

## 2022-10-13 DIAGNOSIS — M25562 Pain in left knee: Secondary | ICD-10-CM | POA: Diagnosis not present

## 2022-10-26 DIAGNOSIS — Z03818 Encounter for observation for suspected exposure to other biological agents ruled out: Secondary | ICD-10-CM | POA: Diagnosis not present

## 2022-10-26 DIAGNOSIS — I509 Heart failure, unspecified: Secondary | ICD-10-CM | POA: Diagnosis not present

## 2022-10-26 DIAGNOSIS — J189 Pneumonia, unspecified organism: Secondary | ICD-10-CM | POA: Diagnosis not present

## 2022-10-26 DIAGNOSIS — J449 Chronic obstructive pulmonary disease, unspecified: Secondary | ICD-10-CM | POA: Diagnosis not present

## 2022-10-26 DIAGNOSIS — J069 Acute upper respiratory infection, unspecified: Secondary | ICD-10-CM | POA: Diagnosis not present

## 2022-10-27 ENCOUNTER — Other Ambulatory Visit: Payer: Self-pay | Admitting: Internal Medicine

## 2022-10-27 ENCOUNTER — Ambulatory Visit
Admission: RE | Admit: 2022-10-27 | Discharge: 2022-10-27 | Disposition: A | Discharge status: Home / Self Care | Payer: Medicare Other | Source: Ambulatory Visit | Attending: Internal Medicine | Admitting: Internal Medicine

## 2022-10-27 DIAGNOSIS — J129 Viral pneumonia, unspecified: Secondary | ICD-10-CM

## 2022-10-27 DIAGNOSIS — R059 Cough, unspecified: Secondary | ICD-10-CM | POA: Diagnosis not present

## 2022-10-29 DIAGNOSIS — J069 Acute upper respiratory infection, unspecified: Secondary | ICD-10-CM | POA: Diagnosis not present

## 2022-10-29 DIAGNOSIS — Z4789 Encounter for other orthopedic aftercare: Secondary | ICD-10-CM | POA: Diagnosis not present

## 2022-11-02 DIAGNOSIS — H9311 Tinnitus, right ear: Secondary | ICD-10-CM | POA: Diagnosis not present

## 2022-11-02 DIAGNOSIS — H6690 Otitis media, unspecified, unspecified ear: Secondary | ICD-10-CM | POA: Diagnosis not present

## 2022-11-03 DIAGNOSIS — R269 Unspecified abnormalities of gait and mobility: Secondary | ICD-10-CM | POA: Diagnosis not present

## 2022-11-03 DIAGNOSIS — M25562 Pain in left knee: Secondary | ICD-10-CM | POA: Diagnosis not present

## 2022-11-03 DIAGNOSIS — Z96652 Presence of left artificial knee joint: Secondary | ICD-10-CM | POA: Diagnosis not present

## 2022-11-03 DIAGNOSIS — R531 Weakness: Secondary | ICD-10-CM | POA: Diagnosis not present

## 2022-11-09 DIAGNOSIS — E669 Obesity, unspecified: Secondary | ICD-10-CM | POA: Diagnosis not present

## 2022-11-09 DIAGNOSIS — Z6835 Body mass index (BMI) 35.0-35.9, adult: Secondary | ICD-10-CM | POA: Diagnosis not present

## 2022-11-09 DIAGNOSIS — Z713 Dietary counseling and surveillance: Secondary | ICD-10-CM | POA: Diagnosis not present

## 2022-11-10 DIAGNOSIS — F1021 Alcohol dependence, in remission: Secondary | ICD-10-CM | POA: Diagnosis not present

## 2022-11-17 DIAGNOSIS — Z6835 Body mass index (BMI) 35.0-35.9, adult: Secondary | ICD-10-CM | POA: Diagnosis not present

## 2022-11-17 DIAGNOSIS — E669 Obesity, unspecified: Secondary | ICD-10-CM | POA: Diagnosis not present

## 2022-11-17 DIAGNOSIS — F54 Psychological and behavioral factors associated with disorders or diseases classified elsewhere: Secondary | ICD-10-CM | POA: Diagnosis not present

## 2022-11-18 DIAGNOSIS — R351 Nocturia: Secondary | ICD-10-CM | POA: Diagnosis not present

## 2022-11-18 DIAGNOSIS — Z23 Encounter for immunization: Secondary | ICD-10-CM | POA: Diagnosis not present

## 2022-11-18 DIAGNOSIS — Z125 Encounter for screening for malignant neoplasm of prostate: Secondary | ICD-10-CM | POA: Diagnosis not present

## 2022-11-18 DIAGNOSIS — F332 Major depressive disorder, recurrent severe without psychotic features: Secondary | ICD-10-CM | POA: Diagnosis not present

## 2022-11-18 DIAGNOSIS — I110 Hypertensive heart disease with heart failure: Secondary | ICD-10-CM | POA: Diagnosis not present

## 2022-11-18 DIAGNOSIS — K760 Fatty (change of) liver, not elsewhere classified: Secondary | ICD-10-CM | POA: Diagnosis not present

## 2022-11-18 DIAGNOSIS — I5032 Chronic diastolic (congestive) heart failure: Secondary | ICD-10-CM | POA: Diagnosis not present

## 2022-11-24 DIAGNOSIS — R972 Elevated prostate specific antigen [PSA]: Secondary | ICD-10-CM | POA: Diagnosis not present

## 2022-11-24 DIAGNOSIS — N400 Enlarged prostate without lower urinary tract symptoms: Secondary | ICD-10-CM | POA: Diagnosis not present

## 2022-11-26 DIAGNOSIS — I10 Essential (primary) hypertension: Secondary | ICD-10-CM | POA: Diagnosis not present

## 2022-11-26 DIAGNOSIS — G4733 Obstructive sleep apnea (adult) (pediatric): Secondary | ICD-10-CM | POA: Diagnosis not present

## 2022-11-26 DIAGNOSIS — E669 Obesity, unspecified: Secondary | ICD-10-CM | POA: Diagnosis not present

## 2022-11-26 DIAGNOSIS — Z6838 Body mass index (BMI) 38.0-38.9, adult: Secondary | ICD-10-CM | POA: Diagnosis not present

## 2022-11-26 DIAGNOSIS — F332 Major depressive disorder, recurrent severe without psychotic features: Secondary | ICD-10-CM | POA: Diagnosis not present

## 2022-12-08 DIAGNOSIS — F1520 Other stimulant dependence, uncomplicated: Secondary | ICD-10-CM | POA: Diagnosis not present

## 2022-12-22 ENCOUNTER — Telehealth (HOSPITAL_COMMUNITY): Payer: Self-pay

## 2022-12-22 NOTE — Telephone Encounter (Signed)
TMS C: Received incoming call from patient early AM on 12/22/22. Patient left vm. Patient stated name & callback #, then reported they have had TMS services "about 4-5 times" externally but would like to try with [facility]. Coordinator will review chart before calling back for phone screening.

## 2022-12-23 DIAGNOSIS — F1520 Other stimulant dependence, uncomplicated: Secondary | ICD-10-CM | POA: Diagnosis not present

## 2022-12-24 ENCOUNTER — Telehealth (HOSPITAL_COMMUNITY): Payer: Self-pay

## 2022-12-24 NOTE — Telephone Encounter (Signed)
TMS C: After discussing case/chart with resident provider, Coordinator placed outgoing call to patient to conduct TMS phone screening. Patient stated they had already set up treatment with another provider but would call back to start process if current placement is not suitable. Coordinator agreed to pt calling back if services are still needed.

## 2022-12-28 DIAGNOSIS — F332 Major depressive disorder, recurrent severe without psychotic features: Secondary | ICD-10-CM | POA: Diagnosis not present

## 2022-12-31 DIAGNOSIS — F332 Major depressive disorder, recurrent severe without psychotic features: Secondary | ICD-10-CM | POA: Diagnosis not present

## 2023-01-04 DIAGNOSIS — F332 Major depressive disorder, recurrent severe without psychotic features: Secondary | ICD-10-CM | POA: Diagnosis not present

## 2023-01-07 DIAGNOSIS — F332 Major depressive disorder, recurrent severe without psychotic features: Secondary | ICD-10-CM | POA: Diagnosis not present

## 2023-01-07 DIAGNOSIS — N401 Enlarged prostate with lower urinary tract symptoms: Secondary | ICD-10-CM | POA: Diagnosis not present

## 2023-01-07 DIAGNOSIS — R739 Hyperglycemia, unspecified: Secondary | ICD-10-CM | POA: Diagnosis not present

## 2023-01-07 DIAGNOSIS — I5032 Chronic diastolic (congestive) heart failure: Secondary | ICD-10-CM | POA: Diagnosis not present

## 2023-01-07 DIAGNOSIS — F339 Major depressive disorder, recurrent, unspecified: Secondary | ICD-10-CM | POA: Diagnosis not present

## 2023-01-11 DIAGNOSIS — F332 Major depressive disorder, recurrent severe without psychotic features: Secondary | ICD-10-CM | POA: Diagnosis not present

## 2023-01-13 DIAGNOSIS — F332 Major depressive disorder, recurrent severe without psychotic features: Secondary | ICD-10-CM | POA: Diagnosis not present

## 2023-01-18 DIAGNOSIS — F332 Major depressive disorder, recurrent severe without psychotic features: Secondary | ICD-10-CM | POA: Diagnosis not present

## 2023-01-19 DIAGNOSIS — F332 Major depressive disorder, recurrent severe without psychotic features: Secondary | ICD-10-CM | POA: Diagnosis not present

## 2023-01-19 DIAGNOSIS — F1520 Other stimulant dependence, uncomplicated: Secondary | ICD-10-CM | POA: Diagnosis not present

## 2023-01-22 DIAGNOSIS — F332 Major depressive disorder, recurrent severe without psychotic features: Secondary | ICD-10-CM | POA: Diagnosis not present

## 2023-01-25 DIAGNOSIS — F332 Major depressive disorder, recurrent severe without psychotic features: Secondary | ICD-10-CM | POA: Diagnosis not present

## 2023-01-26 DIAGNOSIS — F54 Psychological and behavioral factors associated with disorders or diseases classified elsewhere: Secondary | ICD-10-CM | POA: Diagnosis not present

## 2023-01-26 DIAGNOSIS — Z6838 Body mass index (BMI) 38.0-38.9, adult: Secondary | ICD-10-CM | POA: Diagnosis not present

## 2023-01-26 DIAGNOSIS — E669 Obesity, unspecified: Secondary | ICD-10-CM | POA: Diagnosis not present

## 2023-02-01 DIAGNOSIS — F332 Major depressive disorder, recurrent severe without psychotic features: Secondary | ICD-10-CM | POA: Diagnosis not present

## 2023-02-02 DIAGNOSIS — F332 Major depressive disorder, recurrent severe without psychotic features: Secondary | ICD-10-CM | POA: Diagnosis not present

## 2023-02-03 DIAGNOSIS — F332 Major depressive disorder, recurrent severe without psychotic features: Secondary | ICD-10-CM | POA: Diagnosis not present

## 2023-02-03 DIAGNOSIS — F1010 Alcohol abuse, uncomplicated: Secondary | ICD-10-CM | POA: Diagnosis not present

## 2023-02-03 DIAGNOSIS — F314 Bipolar disorder, current episode depressed, severe, without psychotic features: Secondary | ICD-10-CM | POA: Diagnosis not present

## 2023-02-03 DIAGNOSIS — F064 Anxiety disorder due to known physiological condition: Secondary | ICD-10-CM | POA: Diagnosis not present

## 2023-02-03 DIAGNOSIS — F419 Anxiety disorder, unspecified: Secondary | ICD-10-CM | POA: Diagnosis not present

## 2023-02-03 DIAGNOSIS — Z5181 Encounter for therapeutic drug level monitoring: Secondary | ICD-10-CM | POA: Diagnosis not present

## 2023-02-03 DIAGNOSIS — F1011 Alcohol abuse, in remission: Secondary | ICD-10-CM | POA: Diagnosis not present

## 2023-02-04 DIAGNOSIS — F332 Major depressive disorder, recurrent severe without psychotic features: Secondary | ICD-10-CM | POA: Diagnosis not present

## 2023-02-11 DIAGNOSIS — G4733 Obstructive sleep apnea (adult) (pediatric): Secondary | ICD-10-CM | POA: Diagnosis not present

## 2023-02-15 DIAGNOSIS — G4733 Obstructive sleep apnea (adult) (pediatric): Secondary | ICD-10-CM | POA: Diagnosis not present

## 2023-02-15 DIAGNOSIS — E669 Obesity, unspecified: Secondary | ICD-10-CM | POA: Diagnosis not present

## 2023-02-15 DIAGNOSIS — Z6838 Body mass index (BMI) 38.0-38.9, adult: Secondary | ICD-10-CM | POA: Diagnosis not present

## 2023-02-15 DIAGNOSIS — I110 Hypertensive heart disease with heart failure: Secondary | ICD-10-CM | POA: Diagnosis not present

## 2023-02-16 DIAGNOSIS — F332 Major depressive disorder, recurrent severe without psychotic features: Secondary | ICD-10-CM | POA: Diagnosis not present

## 2023-02-22 DIAGNOSIS — F332 Major depressive disorder, recurrent severe without psychotic features: Secondary | ICD-10-CM | POA: Diagnosis not present

## 2023-02-24 DIAGNOSIS — F332 Major depressive disorder, recurrent severe without psychotic features: Secondary | ICD-10-CM | POA: Diagnosis not present

## 2023-02-26 DIAGNOSIS — F314 Bipolar disorder, current episode depressed, severe, without psychotic features: Secondary | ICD-10-CM | POA: Diagnosis not present

## 2023-02-26 DIAGNOSIS — F419 Anxiety disorder, unspecified: Secondary | ICD-10-CM | POA: Diagnosis not present

## 2023-02-26 DIAGNOSIS — F1011 Alcohol abuse, in remission: Secondary | ICD-10-CM | POA: Diagnosis not present

## 2023-03-01 DIAGNOSIS — F332 Major depressive disorder, recurrent severe without psychotic features: Secondary | ICD-10-CM | POA: Diagnosis not present

## 2023-03-03 DIAGNOSIS — F314 Bipolar disorder, current episode depressed, severe, without psychotic features: Secondary | ICD-10-CM | POA: Diagnosis not present

## 2023-03-05 ENCOUNTER — Emergency Department (HOSPITAL_COMMUNITY): Payer: Medicare Other

## 2023-03-05 ENCOUNTER — Other Ambulatory Visit: Payer: Self-pay

## 2023-03-05 ENCOUNTER — Emergency Department (HOSPITAL_COMMUNITY)
Admission: EM | Admit: 2023-03-05 | Discharge: 2023-03-05 | Disposition: A | Discharge status: Home / Self Care | Payer: Medicare Other | Attending: Emergency Medicine | Admitting: Emergency Medicine

## 2023-03-05 ENCOUNTER — Encounter (HOSPITAL_COMMUNITY): Payer: Self-pay

## 2023-03-05 DIAGNOSIS — J449 Chronic obstructive pulmonary disease, unspecified: Secondary | ICD-10-CM | POA: Insufficient documentation

## 2023-03-05 DIAGNOSIS — I959 Hypotension, unspecified: Secondary | ICD-10-CM | POA: Diagnosis not present

## 2023-03-05 DIAGNOSIS — M47812 Spondylosis without myelopathy or radiculopathy, cervical region: Secondary | ICD-10-CM | POA: Diagnosis not present

## 2023-03-05 DIAGNOSIS — M4802 Spinal stenosis, cervical region: Secondary | ICD-10-CM | POA: Diagnosis not present

## 2023-03-05 DIAGNOSIS — Z79899 Other long term (current) drug therapy: Secondary | ICD-10-CM | POA: Diagnosis not present

## 2023-03-05 DIAGNOSIS — K859 Acute pancreatitis without necrosis or infection, unspecified: Secondary | ICD-10-CM | POA: Diagnosis not present

## 2023-03-05 DIAGNOSIS — S199XXA Unspecified injury of neck, initial encounter: Secondary | ICD-10-CM | POA: Diagnosis not present

## 2023-03-05 DIAGNOSIS — W1811XA Fall from or off toilet without subsequent striking against object, initial encounter: Secondary | ICD-10-CM | POA: Diagnosis not present

## 2023-03-05 DIAGNOSIS — R55 Syncope and collapse: Secondary | ICD-10-CM | POA: Insufficient documentation

## 2023-03-05 DIAGNOSIS — E860 Dehydration: Secondary | ICD-10-CM | POA: Diagnosis not present

## 2023-03-05 DIAGNOSIS — I509 Heart failure, unspecified: Secondary | ICD-10-CM | POA: Diagnosis not present

## 2023-03-05 DIAGNOSIS — S0990XA Unspecified injury of head, initial encounter: Secondary | ICD-10-CM | POA: Diagnosis not present

## 2023-03-05 DIAGNOSIS — I4430 Unspecified atrioventricular block: Secondary | ICD-10-CM | POA: Diagnosis not present

## 2023-03-05 DIAGNOSIS — I110 Hypertensive heart disease with heart failure: Secondary | ICD-10-CM | POA: Diagnosis not present

## 2023-03-05 DIAGNOSIS — R42 Dizziness and giddiness: Secondary | ICD-10-CM | POA: Diagnosis not present

## 2023-03-05 DIAGNOSIS — S00212A Abrasion of left eyelid and periocular area, initial encounter: Secondary | ICD-10-CM | POA: Insufficient documentation

## 2023-03-05 LAB — BASIC METABOLIC PANEL
Anion gap: 10 (ref 5–15)
BUN: 32 mg/dL — ABNORMAL HIGH (ref 8–23)
CO2: 27 mmol/L (ref 22–32)
Calcium: 9 mg/dL (ref 8.9–10.3)
Chloride: 102 mmol/L (ref 98–111)
Creatinine, Ser: 1.31 mg/dL — ABNORMAL HIGH (ref 0.61–1.24)
GFR, Estimated: 59 mL/min — ABNORMAL LOW (ref >60)
Glucose, Bld: 125 mg/dL — ABNORMAL HIGH (ref 70–99)
Potassium: 4.4 mmol/L (ref 3.5–5.1)
Sodium: 139 mmol/L (ref 135–145)

## 2023-03-05 LAB — CBC
HCT: 49.6 % (ref 39.0–52.0)
Hemoglobin: 16.6 g/dL (ref 13.0–17.0)
MCH: 32 pg (ref 26.0–34.0)
MCHC: 33.5 g/dL (ref 30.0–36.0)
MCV: 95.6 fL (ref 80.0–100.0)
Platelets: 242 10*3/uL (ref 150–400)
RBC: 5.19 MIL/uL (ref 4.22–5.81)
RDW: 12.8 % (ref 11.5–15.5)
WBC: 11.3 10*3/uL — ABNORMAL HIGH (ref 4.0–10.5)
nRBC: 0 % (ref 0.0–0.2)

## 2023-03-05 LAB — HEPATIC FUNCTION PANEL
ALT: 27 U/L (ref 0–44)
AST: 37 U/L (ref 15–41)
Albumin: 4.4 g/dL (ref 3.5–5.0)
Alkaline Phosphatase: 104 U/L (ref 38–126)
Bilirubin, Direct: 0.4 mg/dL — ABNORMAL HIGH (ref 0.0–0.2)
Indirect Bilirubin: 0.6 mg/dL (ref 0.3–0.9)
Total Bilirubin: 1 mg/dL (ref 0.0–1.2)
Total Protein: 7.2 g/dL (ref 6.5–8.1)

## 2023-03-05 LAB — LIPASE, BLOOD: Lipase: 163 U/L — ABNORMAL HIGH (ref 11–51)

## 2023-03-05 LAB — CBG MONITORING, ED: Glucose-Capillary: 163 mg/dL — ABNORMAL HIGH (ref 70–99)

## 2023-03-05 MED ORDER — ONDANSETRON HCL 4 MG/2ML IJ SOLN
4.0000 mg | Freq: Once | INTRAMUSCULAR | Status: AC
  Administered 2023-03-05: 4 mg via INTRAVENOUS
  Filled 2023-03-05: qty 2

## 2023-03-05 MED ORDER — HYDROCODONE-ACETAMINOPHEN 5-325 MG PO TABS: 1.0000 | ORAL_TABLET | Freq: Four times a day (QID) | ORAL | 0 refills | Status: AC | PRN

## 2023-03-05 MED ORDER — ALUM & MAG HYDROXIDE-SIMETH 200-200-20 MG/5ML PO SUSP
30.0000 mL | Freq: Once | ORAL | Status: AC
  Administered 2023-03-05: 30 mL via ORAL
  Filled 2023-03-05: qty 30

## 2023-03-05 MED ORDER — ONDANSETRON 4 MG PO TBDP: 4.0000 mg | ORAL_TABLET | Freq: Three times a day (TID) | ORAL | 0 refills | Status: AC | PRN

## 2023-03-05 MED ORDER — LIDOCAINE VISCOUS HCL 2 % MT SOLN
15.0000 mL | Freq: Once | OROMUCOSAL | Status: AC
  Administered 2023-03-05: 15 mL via ORAL
  Filled 2023-03-05: qty 15

## 2023-03-05 MED ORDER — SODIUM CHLORIDE 0.9 % IV BOLUS
1000.0000 mL | Freq: Once | INTRAVENOUS | Status: AC
  Administered 2023-03-05: 1000 mL via INTRAVENOUS

## 2023-03-05 NOTE — ED Triage Notes (Signed)
Pt comes from home via [facility] for syncopal episode after going to the bathroom, his head with small abrasion to top of head with hematoma. Not on thinners.

## 2023-03-05 NOTE — ED Notes (Signed)
Lab called to add hepatic function panel and lipase.

## 2023-03-05 NOTE — Discharge Instructions (Signed)
As we discussed your workup today suggest that you are mildly dehydrated, I recommend drinking some electrolyte containing fluids just Pedialyte, Gatorade, taking it easy over the next 2 days to ensure they do not have any additional syncopal episodes.  Additionally as we discussed your lab work suggest that you may have a early acute pancreatitis, the treatment is supportive, just drinking plenty of fluids, avoiding any alcohol, you can use the nausea and pain medication as needed, I recommend that you follow-up with your primary care doctor in around 2 weeks for a lipase recheck, or to return sooner if you have significantly worsening abdominal pain despite treatment.

## 2023-03-05 NOTE — ED Provider Triage Note (Signed)
Emergency Medicine Provider Triage Evaluation Note  [name] , a 68 y.o. male  was evaluated in triage.  Pt complains of syncopal episode after going to the bathroom this morning.  Patient reports that he woke up with stomach hurting, but denies any vomiting, diarrhea, hemo to the bathroom and try to release some gas, when he stepped in the toilet he syncopized and fell forward.  Abrasion/small laceration noted just above the left eyebrow.  Not taking blood thinners.  He denies any chest pain, shortness of breath, confusion, numbness, weakness.  Review of Systems  Positive: Fall, head injury Negative: Stomach pain, cramping  Physical Exam  BP 112/72   Pulse 79   Temp 97.8 F (36.6 C) (Oral)   Resp 16   SpO2 97%  Gen:   Awake, no distress   Resp:  Normal effort  MSK:   Moves extremities without difficulty  Other:  Small abrasion over left eyebrow without any active bleeding.  Moving all 4 limbs spontaneously.  No significant focal tenderness to palpation of the abdomen.  Clear breath sounds bilaterally.  Medical Decision Making  Medically screening exam initiated at 7:37 AM.  Appropriate orders placed.  [name] was informed that the remainder of the evaluation will be completed by another provider, this initial triage assessment does not replace that evaluation, and the importance of remaining in the ED until their evaluation is complete.  Workup initiated in triage    [name], [place] 03/05/23 0740

## 2023-03-05 NOTE — ED Provider Notes (Signed)
[place] EMERGENCY DEPARTMENT AT [facility] Provider Note   CSN: [id number] Arrival date & time: 03/05/23  4098     History  Chief Complaint  Patient presents with   Loss of Consciousness    [name] is a 68 y.o. male with past medical history significant for bipolar disorder, GERD, COPD, hypertension, CHF, sleep apnea who presents after syncopal episode after going to the bathroom this morning.  Patient reports that he woke up with stomach hurting, but denies any vomiting, diarrhea.he reports he went to the bathroom and try to release some gas, when he moved away from toilet he syncopized and fell forward.  Denies sitting on the toilet or straining.  Abrasion/small laceration noted just above the left eyebrow.  Not taking blood thinners.  He denies any chest pain, shortness of breath, confusion, numbness, weakness.    Loss of Consciousness      Home Medications Prior to Admission medications   Medication Sig Start Date End Date Taking? Authorizing Provider  HYDROcodone-acetaminophen (NORCO/VICODIN) 5-325 MG tablet Take 1 tablet by mouth every 6 (six) hours as needed. 03/05/23  Yes Oletha Tolson H, PA-C  ondansetron (ZOFRAN-ODT) 4 MG disintegrating tablet Take 1 tablet (4 mg total) by mouth every 8 (eight) hours as needed for nausea or vomiting. 03/05/23  Yes Emoni Whitworth H, PA-C  albuterol (PROAIR HFA) 108 (90 Base) MCG/ACT inhaler Inhale 2 puffs into the lungs every 6 (six) hours as needed for wheezing or shortness of breath. 09/09/17   Armandina Stammer I, NP  albuterol (PROVENTIL) (2.5 MG/3ML) 0.083% nebulizer solution Take 3 mLs (2.5 mg total) by nebulization every 6 (six) hours as needed for wheezing or shortness of breath. 09/09/17   Armandina Stammer I, NP  atorvastatin (LIPITOR) 10 MG tablet Take 10 mg by mouth daily.    [provider]  escitalopram (LEXAPRO) 10 MG tablet Take 10 mg by mouth daily.    [provider]   Fluticasone-Salmeterol 113-14 MCG/ACT AEPB Inhale 1 puff into the lungs 2 (two) times daily. 01/17/23   [provider]  furosemide (LASIX) 40 MG tablet Take 40 mg by mouth daily as needed for fluid.    [provider]  gabapentin (NEURONTIN) 300 MG capsule Take 2 capsules (600 mg total) by mouth 3 (three) times daily. For agitation Patient not taking: Reported on 07/03/2022 09/09/17   Armandina Stammer I, NP  gabapentin (NEURONTIN) 800 MG tablet Take 800 mg by mouth 3 (three) times daily.    [provider]  hydrOXYzine (VISTARIL) 25 MG capsule Take 1 capsule by mouth daily. 07/19/18   [provider]  lamoTRIgine (LAMICTAL) 100 MG tablet Take 100 mg by mouth 2 (two) times daily.    [provider]  losartan (COZAAR) 100 MG tablet TAKE 1 TABLET BY MOUTH DAILY. Patient not taking: Reported on 07/03/2022 03/29/18   Shelva Majestic, MD  Melatonin 5 MG TABS Take 1 tablet (5 mg total) by mouth at bedtime. For sleep Patient taking differently: Take 10 mg by mouth at bedtime. For sleep 09/09/17   Armandina Stammer I, NP  methocarbamol (ROBAXIN) 500 MG tablet Take 1 tablet (500 mg total) by mouth every 8 (eight) hours as needed for muscle spasms. 07/17/22   Beverely Low, MD  Multiple Vitamin (MULTIVITAMIN WITH MINERALS) TABS tablet Take 1 tablet by mouth daily.    [provider]  nitroGLYCERIN (NITROSTAT) 0.4 MG SL tablet Place 1 tablet (0.4 mg total) under the tongue every  5 (five) minutes as needed for chest pain. 07/26/18 07/03/22  Yates Decamp, MD  ondansetron (ZOFRAN) 4 MG tablet Take 1 tablet (4 mg total) by mouth every 8 (eight) hours as needed for vomiting, refractory nausea / vomiting or nausea. 07/17/22 07/17/23  Beverely Low, MD  oxyCODONE (ROXICODONE) 5 MG immediate release tablet Take 1 tablet (5 mg total) by mouth every 4 (four) hours as needed for severe pain or breakthrough pain. 07/17/22   Beverely Low, MD  predniSONE (DELTASONE) 20 MG tablet Take 2  tablets (40 mg total) by mouth daily. Patient not taking: Reported on 07/03/2022 03/29/21   Renne Crigler, PA-C  QUEtiapine (SEROQUEL) 100 MG tablet Take 100 mg by mouth 4 (four) times daily. 01/19/23   [provider]  QUEtiapine (SEROQUEL) 50 MG tablet Take 100 mg by mouth at bedtime as needed. 01/01/23   [provider]  Semaglutide-Weight Management 0.5 MG/0.5ML SOAJ Inject 0.5 mg into the skin once a week. 02/16/23   [provider]  spironolactone (ALDACTONE) 25 MG tablet Take 1 tablet (25 mg total) by mouth every morning. 08/22/18 07/03/22  Yates Decamp, MD  SPRAVATO, 84 MG DOSE, 28 MG/DEVICE SOPK Place 84 mg into both nostrils 2 (two) times a week. 02/23/23   [provider]  tamsulosin (FLOMAX) 0.4 MG CAPS capsule Take 0.4 mg by mouth at bedtime. 01/08/23   [provider]  telmisartan (MICARDIS) 20 MG tablet Take 20 mg by mouth daily.    [provider]  traMADol (ULTRAM) 50 MG tablet Take 100 mg by mouth in the morning, at noon, in the evening, and at bedtime. 08/18/18   [provider]  traZODone (DESYREL) 100 MG tablet Take 100 mg by mouth at bedtime as needed for sleep. 08/01/18   [provider]  traZODone (DESYREL) 50 MG tablet Take 50-100 mg by mouth at bedtime. 02/15/23   [provider]      Allergies    Omeprazole and Topiramate    Review of Systems   Review of Systems  Cardiovascular:  Positive for syncope.  All other systems reviewed and are negative.   Physical Exam Updated Vital Signs BP 116/63   Pulse 63   Temp 97.8 F (36.6 C) (Oral)   Resp 19   Ht 5\' 10"  (1.778 m)   Wt 127.9 kg   SpO2 96%   BMI 40.46 kg/m  Physical Exam Vitals and nursing note reviewed.  Constitutional:      General: He is not in acute distress.    Appearance: Normal appearance.  HENT:     Head: Normocephalic and atraumatic.  Eyes:     General:        Right eye: No discharge.        Left eye: No discharge.   Cardiovascular:     Rate and Rhythm: Normal rate and regular rhythm.     Heart sounds: No murmur heard.    No friction rub. No gallop.  Pulmonary:     Effort: Pulmonary effort is normal.     Breath sounds: Normal breath sounds.  Abdominal:     General: Bowel sounds are normal.     Palpations: Abdomen is soft.  Skin:    General: Skin is warm and dry.     Capillary Refill: Capillary refill takes less than 2 seconds.     Comments: Small abrasion over left eyebrow with no active bleeding.  No dehiscence, no evidence of need for laceration repair.  Neurological:  Mental Status: He is alert and oriented to person, place, and time.     Comments: Moves all 4 limbs spontaneously, CN II through XII grossly intact, can ambulate without difficulty, intact sensation throughout.   Psychiatric:        Mood and Affect: Mood normal.        Behavior: Behavior normal.     ED Results / Procedures / Treatments   Labs (all labs ordered are listed, but only abnormal results are displayed) Labs Reviewed  BASIC METABOLIC PANEL - Abnormal; Notable for the following components:      Result Value   Glucose, Bld 125 (*)    BUN 32 (*)    Creatinine, Ser 1.31 (*)    GFR, Estimated 59 (*)    All other components within normal limits  CBC - Abnormal; Notable for the following components:   WBC 11.3 (*)    All other components within normal limits  HEPATIC FUNCTION PANEL - Abnormal; Notable for the following components:   Bilirubin, Direct 0.4 (*)    All other components within normal limits  LIPASE, BLOOD - Abnormal; Notable for the following components:   Lipase 163 (*)    All other components within normal limits  CBG MONITORING, ED - Abnormal; Notable for the following components:   Glucose-Capillary 163 (*)    All other components within normal limits  URINALYSIS, ROUTINE W REFLEX MICROSCOPIC    EKG EKG Interpretation Date/Time:  Friday March 05 2023 07:30:05 EST Ventricular Rate:   82 PR Interval:  194 QRS Duration:  92 QT Interval:  384 QTC Calculation: 448 R Axis:   104  Text Interpretation: Normal sinus rhythm Rightward axis Septal infarct , age undetermined T wave abnormality Abnormal ECG Confirmed by Gerhard Munch [phone]) on 03/05/2023 7:37:23 AM  Radiology CT Head Wo Contrast Result Date: 03/05/2023 CLINICAL DATA:  Provided history: Head trauma, minor. Neck trauma. Additional history obtained from electronic MEDICAL RECORD NUMBERSyncopal episode, abrasion and hematoma to head. EXAM: CT HEAD WITHOUT CONTRAST CT CERVICAL SPINE WITHOUT CONTRAST TECHNIQUE: Multidetector CT imaging of the head and cervical spine was performed following the standard protocol without intravenous contrast. Multiplanar CT image reconstructions of the cervical spine were also generated. RADIATION DOSE REDUCTION: This exam was performed according to the departmental dose-optimization program which includes automated exposure control, adjustment of the mA and/or kV according to patient size and/or use of iterative reconstruction technique. COMPARISON:  Head CT 04/10/2022. Cervical spine radiographs 07/12/2014. FINDINGS: CT HEAD FINDINGS Brain: Mild generalized cerebral volume loss. Again noted, there is a left middle cranial fossa arachnoid cyst overlying the anterior left temporal lobe which measures 4.4 x 3.5 cm in transaxial dimensions. There is no acute intracranial hemorrhage. No demarcated cortical infarct. No evidence of an intracranial mass. No midline shift. Vascular: No hyperdense vessel.  Atherosclerotic calcifications. Skull: No calvarial fracture or aggressive osseous lesion. Sinuses/Orbits: No mass or acute finding within the imaged orbits. Minimal mucosal thickening within the inferior frontal sinuses, bilaterally. Mild mucosal thickening within bilateral ethmoid air cells. CT CERVICAL SPINE FINDINGS Alignment: 3 mm C3-C4 and C4-C5 grade 1 anterolisthesis. Skull base and vertebrae: The  basion-dental and atlanto-dental intervals are maintained.No evidence of acute fracture to the cervical spine. Soft tissues and spinal canal: No prevertebral fluid or swelling. No visible canal hematoma. Disc levels: Cervical spondylosis with multilevel disc space narrowing, disc bulges, uncovertebral hypertrophy and facet arthrosis. Disc space narrowing is greatest at C6-C7 (advanced at this level). No appreciable high-grade spinal  canal stenosis. Multilevel bony neural foraminal narrowing. Upper chest: No consolidation within the imaged lung apices. No visible pneumothorax. IMPRESSION: CT head: 1.  No evidence of an acute intracranial abnormality. 2. 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 3. Mild generalized cerebral volume loss. 4. Paranasal sinus disease at the imaged levels, as described. CT cervical spine: 1. No evidence of an acute cervical spine fracture. 2. Grade 1 anterolisthesis at C3-C4 and C4-C5. 3. Cervical spondylosis as described. Electronically Signed   By: Jackey Loge D.O.   On: 03/05/2023 10:18   CT Cervical Spine Wo Contrast Result Date: 03/05/2023 CLINICAL DATA:  Provided history: Head trauma, minor. Neck trauma. Additional history obtained from electronic MEDICAL RECORD NUMBERSyncopal episode, abrasion and hematoma to head. EXAM: CT HEAD WITHOUT CONTRAST CT CERVICAL SPINE WITHOUT CONTRAST TECHNIQUE: Multidetector CT imaging of the head and cervical spine was performed following the standard protocol without intravenous contrast. Multiplanar CT image reconstructions of the cervical spine were also generated. RADIATION DOSE REDUCTION: This exam was performed according to the departmental dose-optimization program which includes automated exposure control, adjustment of the mA and/or kV according to patient size and/or use of iterative reconstruction technique. COMPARISON:  Head CT 04/10/2022. Cervical spine radiographs 07/12/2014. FINDINGS: CT HEAD FINDINGS Brain: Mild generalized  cerebral volume loss. Again noted, there is a left middle cranial fossa arachnoid cyst overlying the anterior left temporal lobe which measures 4.4 x 3.5 cm in transaxial dimensions. There is no acute intracranial hemorrhage. No demarcated cortical infarct. No evidence of an intracranial mass. No midline shift. Vascular: No hyperdense vessel.  Atherosclerotic calcifications. Skull: No calvarial fracture or aggressive osseous lesion. Sinuses/Orbits: No mass or acute finding within the imaged orbits. Minimal mucosal thickening within the inferior frontal sinuses, bilaterally. Mild mucosal thickening within bilateral ethmoid air cells. CT CERVICAL SPINE FINDINGS Alignment: 3 mm C3-C4 and C4-C5 grade 1 anterolisthesis. Skull base and vertebrae: The basion-dental and atlanto-dental intervals are maintained.No evidence of acute fracture to the cervical spine. Soft tissues and spinal canal: No prevertebral fluid or swelling. No visible canal hematoma. Disc levels: Cervical spondylosis with multilevel disc space narrowing, disc bulges, uncovertebral hypertrophy and facet arthrosis. Disc space narrowing is greatest at C6-C7 (advanced at this level). No appreciable high-grade spinal canal stenosis. Multilevel bony neural foraminal narrowing. Upper chest: No consolidation within the imaged lung apices. No visible pneumothorax. IMPRESSION: CT head: 1.  No evidence of an acute intracranial abnormality. 2. 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 3. Mild generalized cerebral volume loss. 4. Paranasal sinus disease at the imaged levels, as described. CT cervical spine: 1. No evidence of an acute cervical spine fracture. 2. Grade 1 anterolisthesis at C3-C4 and C4-C5. 3. Cervical spondylosis as described. Electronically Signed   By: Jackey Loge D.O.   On: 03/05/2023 10:18    Procedures Procedures    Medications Ordered in ED Medications  sodium chloride 0.9 % bolus 1,000 mL (0 mLs Intravenous Stopped 03/05/23  0945)  ondansetron (ZOFRAN) injection 4 mg (4 mg Intravenous Given 03/05/23 0856)  alum & mag hydroxide-simeth (MAALOX/MYLANTA) 200-200-20 MG/5ML suspension 30 mL (30 mLs Oral Given 03/05/23 0855)    And  lidocaine (XYLOCAINE) 2 % viscous mouth solution 15 mL (15 mLs Oral Given 03/05/23 2130)    ED Course/ Medical Decision Making/ A&P                                 Medical Decision  Making Amount and/or Complexity of Data Reviewed Labs: ordered. Radiology: ordered.  Risk OTC drugs. Prescription drug management.   This patient is a 68 y.o. male  who presents to the ED for concern of fall, head injury, possible dehydration, abdominal pain.   Differential diagnoses prior to evaluation: The emergent differential diagnosis includes, but is not limited to,  The causes of generalized abdominal pain include but are not limited to AAA, mesenteric ischemia, appendicitis, diverticulitis, DKA, gastritis, gastroenteritis, AMI, nephrolithiasis, pancreatitis, peritonitis, adrenal insufficiency,lead poisoning, iron toxicity, intestinal ischemia, constipation, UTI,SBO/LBO, splenic rupture, biliary disease, IBD, IBS, PUD, or hepatitis, epidural hematoma, subdural hematoma, skull fracture, subarachnoid hemorrhage, unstable cervical spine fracture, concussion vs other MSK injury  . This is not an exhaustive differential.   Past Medical History / Co-morbidities / Social History: bipolar disorder, GERD, COPD, hypertension, CHF, sleep apnea   Additional history: Chart reviewed. Pertinent results include: Reviewed remote echo from 2020 with overall normal left ventricular ejection fraction at that time ~57%, patient due for repeat echo soon  Physical Exam: Physical exam performed. The pertinent findings include: Small abrasion over left eyebrow with no active bleeding.  No dehiscence, no evidence of need for laceration repair.   Moves all 4 limbs spontaneously, CN II through XII grossly intact, can ambulate  without difficulty, intact sensation throughout  Lab Tests/Imaging studies: I personally interpreted labs/imaging and the pertinent results include:  bmp notable mild elevation of BUN, at 32, creatinine at 1.31, consistent with dehydration related to diuretic use. CBC mildly elevated 11.3 which is consistent with traumatic fall.  Hepatic function panel, lipase elevated at 163, consistent with early acute pancreatitis.  I independently interpreted CT head without contrast, CT C-spine without contrast which shows some age-related degenerative changes but no evidence of acute intracranial injury, or unstable cervical spinal injury, or other fracture. I agree with the radiologist interpretation.  Cardiac monitoring: EKG obtained and interpreted by myself and attending physician which shows: normal sinus Rhythm, no acute ST-T changes   Medications: I ordered medication including fluid bolus, Zofran, GI cocktail for nausea, epigastric abdominal pain, patient with improvement of blood pressure, feeling of lightheadedness, and his abdominal pain after the above, he is feeling well, denies any ongoing dizziness..  I have reviewed the patients home medicines and have made adjustments as needed.   Disposition: After consideration of the diagnostic results and the patients response to treatment, I feel that patient with unremarkable workup, suspect mild dehydration related to inappropriate diuresis, encouraged him to hold his diuretics, and gently hydrate.  Tetanus up-to-date, the abrasion on his forehead does not require suture repair.  Encouraged him to monitor for signs of appropriate healing and look out for signs of infection including worsening pain, redness, swelling, pus draining from the affected site.  Patient understands and agrees to plan..  For his early pancreatitis we discussed nausea, pain medication, hydration, and PCP recheck of lipase.  He continues to not have significant abdominal pain on exam,  we discussed a CT to evaluate for any intra-abdominal pathology that may be contributing to elevation of his lipase but as he is pain-free discussed that this is reasonable to defer outpatient.  Discussed extensive return precautions.  emergency department workup does not suggest an emergent condition requiring admission or immediate intervention beyond what has been performed at this time. The plan is: as above. The patient is safe for discharge and has been instructed to return immediately for worsening symptoms, change in symptoms or any other concerns.  Final Clinical Impression(s) / ED Diagnoses Final diagnoses:  Dehydration  Syncope, unspecified syncope type  Injury of head, initial encounter  Acute pancreatitis, unspecified complication status, unspecified pancreatitis type    Rx / DC Orders ED Discharge Orders          Ordered    ondansetron (ZOFRAN-ODT) 4 MG disintegrating tablet  Every 8 hours PRN        03/05/23 1144    HYDROcodone-acetaminophen (NORCO/VICODIN) 5-325 MG tablet  Every 6 hours PRN        03/05/23 1144              Shavonne Ambroise, [place] H, PA-C 03/05/23 1144    Gerhard Munch, MD 03/05/23 1534

## 2023-03-09 DIAGNOSIS — F332 Major depressive disorder, recurrent severe without psychotic features: Secondary | ICD-10-CM | POA: Diagnosis not present

## 2023-03-10 DIAGNOSIS — F314 Bipolar disorder, current episode depressed, severe, without psychotic features: Secondary | ICD-10-CM | POA: Diagnosis not present

## 2023-03-12 DIAGNOSIS — F1011 Alcohol abuse, in remission: Secondary | ICD-10-CM | POA: Diagnosis not present

## 2023-03-12 DIAGNOSIS — F314 Bipolar disorder, current episode depressed, severe, without psychotic features: Secondary | ICD-10-CM | POA: Diagnosis not present

## 2023-03-12 DIAGNOSIS — F419 Anxiety disorder, unspecified: Secondary | ICD-10-CM | POA: Diagnosis not present

## 2023-03-14 DIAGNOSIS — G4733 Obstructive sleep apnea (adult) (pediatric): Secondary | ICD-10-CM | POA: Diagnosis not present

## 2023-03-15 DIAGNOSIS — T402X5A Adverse effect of other opioids, initial encounter: Secondary | ICD-10-CM | POA: Diagnosis not present

## 2023-03-15 DIAGNOSIS — F332 Major depressive disorder, recurrent severe without psychotic features: Secondary | ICD-10-CM | POA: Diagnosis not present

## 2023-03-15 DIAGNOSIS — M5416 Radiculopathy, lumbar region: Secondary | ICD-10-CM | POA: Diagnosis not present

## 2023-03-17 DIAGNOSIS — F314 Bipolar disorder, current episode depressed, severe, without psychotic features: Secondary | ICD-10-CM | POA: Diagnosis not present

## 2023-03-18 DIAGNOSIS — E860 Dehydration: Secondary | ICD-10-CM | POA: Diagnosis not present

## 2023-03-18 DIAGNOSIS — R1013 Epigastric pain: Secondary | ICD-10-CM | POA: Diagnosis not present

## 2023-03-18 DIAGNOSIS — G4733 Obstructive sleep apnea (adult) (pediatric): Secondary | ICD-10-CM | POA: Diagnosis not present

## 2023-03-18 DIAGNOSIS — K859 Acute pancreatitis without necrosis or infection, unspecified: Secondary | ICD-10-CM | POA: Diagnosis not present

## 2023-03-18 DIAGNOSIS — I5032 Chronic diastolic (congestive) heart failure: Secondary | ICD-10-CM | POA: Diagnosis not present

## 2023-03-22 DIAGNOSIS — Z6841 Body Mass Index (BMI) 40.0 and over, adult: Secondary | ICD-10-CM | POA: Diagnosis not present

## 2023-03-22 DIAGNOSIS — F332 Major depressive disorder, recurrent severe without psychotic features: Secondary | ICD-10-CM | POA: Diagnosis not present

## 2023-03-22 DIAGNOSIS — E669 Obesity, unspecified: Secondary | ICD-10-CM | POA: Diagnosis not present

## 2023-03-22 DIAGNOSIS — Z713 Dietary counseling and surveillance: Secondary | ICD-10-CM | POA: Diagnosis not present

## 2023-03-24 DIAGNOSIS — F314 Bipolar disorder, current episode depressed, severe, without psychotic features: Secondary | ICD-10-CM | POA: Diagnosis not present

## 2023-03-24 DIAGNOSIS — Z6841 Body Mass Index (BMI) 40.0 and over, adult: Secondary | ICD-10-CM | POA: Diagnosis not present

## 2023-03-24 DIAGNOSIS — F54 Psychological and behavioral factors associated with disorders or diseases classified elsewhere: Secondary | ICD-10-CM | POA: Diagnosis not present

## 2023-03-24 DIAGNOSIS — E669 Obesity, unspecified: Secondary | ICD-10-CM | POA: Diagnosis not present

## 2023-03-25 DIAGNOSIS — F332 Major depressive disorder, recurrent severe without psychotic features: Secondary | ICD-10-CM | POA: Diagnosis not present

## 2023-03-29 DIAGNOSIS — F332 Major depressive disorder, recurrent severe without psychotic features: Secondary | ICD-10-CM | POA: Diagnosis not present

## 2023-03-29 DIAGNOSIS — G4733 Obstructive sleep apnea (adult) (pediatric): Secondary | ICD-10-CM | POA: Diagnosis not present

## 2023-03-31 DIAGNOSIS — F314 Bipolar disorder, current episode depressed, severe, without psychotic features: Secondary | ICD-10-CM | POA: Diagnosis not present

## 2023-04-01 DIAGNOSIS — F332 Major depressive disorder, recurrent severe without psychotic features: Secondary | ICD-10-CM | POA: Diagnosis not present

## 2023-04-05 DIAGNOSIS — F332 Major depressive disorder, recurrent severe without psychotic features: Secondary | ICD-10-CM | POA: Diagnosis not present

## 2023-04-12 DIAGNOSIS — F332 Major depressive disorder, recurrent severe without psychotic features: Secondary | ICD-10-CM | POA: Diagnosis not present

## 2023-04-13 DIAGNOSIS — F314 Bipolar disorder, current episode depressed, severe, without psychotic features: Secondary | ICD-10-CM | POA: Diagnosis not present

## 2023-04-13 DIAGNOSIS — F419 Anxiety disorder, unspecified: Secondary | ICD-10-CM | POA: Diagnosis not present

## 2023-04-13 DIAGNOSIS — F1011 Alcohol abuse, in remission: Secondary | ICD-10-CM | POA: Diagnosis not present

## 2023-04-14 DIAGNOSIS — G4733 Obstructive sleep apnea (adult) (pediatric): Secondary | ICD-10-CM | POA: Diagnosis not present

## 2023-04-14 DIAGNOSIS — F332 Major depressive disorder, recurrent severe without psychotic features: Secondary | ICD-10-CM | POA: Diagnosis not present

## 2023-04-15 DIAGNOSIS — F332 Major depressive disorder, recurrent severe without psychotic features: Secondary | ICD-10-CM | POA: Diagnosis not present

## 2023-04-19 DIAGNOSIS — F332 Major depressive disorder, recurrent severe without psychotic features: Secondary | ICD-10-CM | POA: Diagnosis not present

## 2023-04-21 DIAGNOSIS — F314 Bipolar disorder, current episode depressed, severe, without psychotic features: Secondary | ICD-10-CM | POA: Diagnosis not present

## 2023-04-22 DIAGNOSIS — F332 Major depressive disorder, recurrent severe without psychotic features: Secondary | ICD-10-CM | POA: Diagnosis not present

## 2023-04-26 DIAGNOSIS — F332 Major depressive disorder, recurrent severe without psychotic features: Secondary | ICD-10-CM | POA: Diagnosis not present

## 2023-04-28 DIAGNOSIS — I5032 Chronic diastolic (congestive) heart failure: Secondary | ICD-10-CM | POA: Diagnosis not present

## 2023-04-28 DIAGNOSIS — F332 Major depressive disorder, recurrent severe without psychotic features: Secondary | ICD-10-CM | POA: Diagnosis not present

## 2023-04-28 DIAGNOSIS — I110 Hypertensive heart disease with heart failure: Secondary | ICD-10-CM | POA: Diagnosis not present

## 2023-04-29 DIAGNOSIS — F332 Major depressive disorder, recurrent severe without psychotic features: Secondary | ICD-10-CM | POA: Diagnosis not present

## 2023-05-03 DIAGNOSIS — F332 Major depressive disorder, recurrent severe without psychotic features: Secondary | ICD-10-CM | POA: Diagnosis not present

## 2023-05-05 DIAGNOSIS — F314 Bipolar disorder, current episode depressed, severe, without psychotic features: Secondary | ICD-10-CM | POA: Diagnosis not present

## 2023-05-06 DIAGNOSIS — F332 Major depressive disorder, recurrent severe without psychotic features: Secondary | ICD-10-CM | POA: Diagnosis not present

## 2023-05-10 DIAGNOSIS — F332 Major depressive disorder, recurrent severe without psychotic features: Secondary | ICD-10-CM | POA: Diagnosis not present

## 2023-05-12 DIAGNOSIS — F332 Major depressive disorder, recurrent severe without psychotic features: Secondary | ICD-10-CM | POA: Diagnosis not present

## 2023-05-13 DIAGNOSIS — F332 Major depressive disorder, recurrent severe without psychotic features: Secondary | ICD-10-CM | POA: Diagnosis not present

## 2023-05-17 DIAGNOSIS — F332 Major depressive disorder, recurrent severe without psychotic features: Secondary | ICD-10-CM | POA: Diagnosis not present

## 2023-05-19 DIAGNOSIS — F332 Major depressive disorder, recurrent severe without psychotic features: Secondary | ICD-10-CM | POA: Diagnosis not present

## 2023-05-24 DIAGNOSIS — F332 Major depressive disorder, recurrent severe without psychotic features: Secondary | ICD-10-CM | POA: Diagnosis not present

## 2023-05-26 DIAGNOSIS — F314 Bipolar disorder, current episode depressed, severe, without psychotic features: Secondary | ICD-10-CM | POA: Diagnosis not present

## 2023-05-27 DIAGNOSIS — F332 Major depressive disorder, recurrent severe without psychotic features: Secondary | ICD-10-CM | POA: Diagnosis not present

## 2023-05-28 DIAGNOSIS — F314 Bipolar disorder, current episode depressed, severe, without psychotic features: Secondary | ICD-10-CM | POA: Diagnosis not present

## 2023-05-28 DIAGNOSIS — F1011 Alcohol abuse, in remission: Secondary | ICD-10-CM | POA: Diagnosis not present

## 2023-05-28 DIAGNOSIS — F419 Anxiety disorder, unspecified: Secondary | ICD-10-CM | POA: Diagnosis not present

## 2023-05-31 DIAGNOSIS — F332 Major depressive disorder, recurrent severe without psychotic features: Secondary | ICD-10-CM | POA: Diagnosis not present

## 2023-06-07 DIAGNOSIS — F332 Major depressive disorder, recurrent severe without psychotic features: Secondary | ICD-10-CM | POA: Diagnosis not present

## 2023-06-14 DIAGNOSIS — F332 Major depressive disorder, recurrent severe without psychotic features: Secondary | ICD-10-CM | POA: Diagnosis not present

## 2023-06-16 DIAGNOSIS — F332 Major depressive disorder, recurrent severe without psychotic features: Secondary | ICD-10-CM | POA: Diagnosis not present

## 2023-06-21 DIAGNOSIS — F332 Major depressive disorder, recurrent severe without psychotic features: Secondary | ICD-10-CM | POA: Diagnosis not present

## 2023-06-25 DIAGNOSIS — J449 Chronic obstructive pulmonary disease, unspecified: Secondary | ICD-10-CM | POA: Diagnosis not present

## 2023-06-25 DIAGNOSIS — R233 Spontaneous ecchymoses: Secondary | ICD-10-CM | POA: Diagnosis not present

## 2023-06-25 DIAGNOSIS — I5032 Chronic diastolic (congestive) heart failure: Secondary | ICD-10-CM | POA: Diagnosis not present

## 2023-06-25 DIAGNOSIS — E559 Vitamin D deficiency, unspecified: Secondary | ICD-10-CM | POA: Diagnosis not present

## 2023-06-25 DIAGNOSIS — I110 Hypertensive heart disease with heart failure: Secondary | ICD-10-CM | POA: Diagnosis not present

## 2023-06-28 DIAGNOSIS — F332 Major depressive disorder, recurrent severe without psychotic features: Secondary | ICD-10-CM | POA: Diagnosis not present

## 2023-06-30 DIAGNOSIS — H5213 Myopia, bilateral: Secondary | ICD-10-CM | POA: Diagnosis not present

## 2023-06-30 DIAGNOSIS — F314 Bipolar disorder, current episode depressed, severe, without psychotic features: Secondary | ICD-10-CM | POA: Diagnosis not present

## 2023-07-05 DIAGNOSIS — J479 Bronchiectasis, uncomplicated: Secondary | ICD-10-CM | POA: Diagnosis not present

## 2023-07-05 DIAGNOSIS — R0602 Shortness of breath: Secondary | ICD-10-CM | POA: Diagnosis not present

## 2023-07-05 DIAGNOSIS — J310 Chronic rhinitis: Secondary | ICD-10-CM | POA: Diagnosis not present

## 2023-07-05 DIAGNOSIS — F332 Major depressive disorder, recurrent severe without psychotic features: Secondary | ICD-10-CM | POA: Diagnosis not present

## 2023-07-08 DIAGNOSIS — Z7401 Bed confinement status: Secondary | ICD-10-CM | POA: Diagnosis not present

## 2023-07-08 DIAGNOSIS — R404 Transient alteration of awareness: Secondary | ICD-10-CM | POA: Diagnosis not present

## 2023-07-19 DIAGNOSIS — F332 Major depressive disorder, recurrent severe without psychotic features: Secondary | ICD-10-CM | POA: Diagnosis not present

## 2023-07-23 DIAGNOSIS — F419 Anxiety disorder, unspecified: Secondary | ICD-10-CM | POA: Diagnosis not present

## 2023-07-23 DIAGNOSIS — F1011 Alcohol abuse, in remission: Secondary | ICD-10-CM | POA: Diagnosis not present

## 2023-07-23 DIAGNOSIS — F314 Bipolar disorder, current episode depressed, severe, without psychotic features: Secondary | ICD-10-CM | POA: Diagnosis not present

## 2023-07-26 DIAGNOSIS — F332 Major depressive disorder, recurrent severe without psychotic features: Secondary | ICD-10-CM | POA: Diagnosis not present

## 2023-07-28 DIAGNOSIS — M5416 Radiculopathy, lumbar region: Secondary | ICD-10-CM | POA: Diagnosis not present

## 2023-07-30 DIAGNOSIS — I10 Essential (primary) hypertension: Secondary | ICD-10-CM | POA: Diagnosis not present

## 2023-07-30 DIAGNOSIS — J449 Chronic obstructive pulmonary disease, unspecified: Secondary | ICD-10-CM | POA: Diagnosis not present

## 2023-07-30 DIAGNOSIS — I110 Hypertensive heart disease with heart failure: Secondary | ICD-10-CM | POA: Diagnosis not present

## 2023-07-30 DIAGNOSIS — I5032 Chronic diastolic (congestive) heart failure: Secondary | ICD-10-CM | POA: Diagnosis not present

## 2023-08-02 DIAGNOSIS — F332 Major depressive disorder, recurrent severe without psychotic features: Secondary | ICD-10-CM | POA: Diagnosis not present

## 2023-08-04 DIAGNOSIS — F332 Major depressive disorder, recurrent severe without psychotic features: Secondary | ICD-10-CM | POA: Diagnosis not present

## 2023-08-09 DIAGNOSIS — F332 Major depressive disorder, recurrent severe without psychotic features: Secondary | ICD-10-CM | POA: Diagnosis not present

## 2023-08-16 DIAGNOSIS — J449 Chronic obstructive pulmonary disease, unspecified: Secondary | ICD-10-CM | POA: Diagnosis not present

## 2023-08-16 DIAGNOSIS — I5032 Chronic diastolic (congestive) heart failure: Secondary | ICD-10-CM | POA: Diagnosis not present

## 2023-08-16 DIAGNOSIS — N401 Enlarged prostate with lower urinary tract symptoms: Secondary | ICD-10-CM | POA: Diagnosis not present

## 2023-08-16 DIAGNOSIS — I110 Hypertensive heart disease with heart failure: Secondary | ICD-10-CM | POA: Diagnosis not present

## 2023-08-16 DIAGNOSIS — I10 Essential (primary) hypertension: Secondary | ICD-10-CM | POA: Diagnosis not present

## 2023-08-16 DIAGNOSIS — F332 Major depressive disorder, recurrent severe without psychotic features: Secondary | ICD-10-CM | POA: Diagnosis not present

## 2023-08-23 DIAGNOSIS — F332 Major depressive disorder, recurrent severe without psychotic features: Secondary | ICD-10-CM | POA: Diagnosis not present

## 2023-08-28 DIAGNOSIS — I10 Essential (primary) hypertension: Secondary | ICD-10-CM | POA: Diagnosis not present

## 2023-08-28 DIAGNOSIS — I110 Hypertensive heart disease with heart failure: Secondary | ICD-10-CM | POA: Diagnosis not present

## 2023-08-28 DIAGNOSIS — J449 Chronic obstructive pulmonary disease, unspecified: Secondary | ICD-10-CM | POA: Diagnosis not present

## 2023-08-28 DIAGNOSIS — I5032 Chronic diastolic (congestive) heart failure: Secondary | ICD-10-CM | POA: Diagnosis not present

## 2023-08-30 DIAGNOSIS — F332 Major depressive disorder, recurrent severe without psychotic features: Secondary | ICD-10-CM | POA: Diagnosis not present

## 2023-09-03 DIAGNOSIS — E559 Vitamin D deficiency, unspecified: Secondary | ICD-10-CM | POA: Diagnosis not present

## 2023-09-06 DIAGNOSIS — F332 Major depressive disorder, recurrent severe without psychotic features: Secondary | ICD-10-CM | POA: Diagnosis not present

## 2023-09-09 DIAGNOSIS — R399 Unspecified symptoms and signs involving the genitourinary system: Secondary | ICD-10-CM | POA: Diagnosis not present

## 2023-09-09 DIAGNOSIS — N401 Enlarged prostate with lower urinary tract symptoms: Secondary | ICD-10-CM | POA: Diagnosis not present

## 2023-09-13 DIAGNOSIS — F332 Major depressive disorder, recurrent severe without psychotic features: Secondary | ICD-10-CM | POA: Diagnosis not present

## 2023-09-14 DIAGNOSIS — I110 Hypertensive heart disease with heart failure: Secondary | ICD-10-CM | POA: Diagnosis not present

## 2023-09-14 DIAGNOSIS — Z1211 Encounter for screening for malignant neoplasm of colon: Secondary | ICD-10-CM | POA: Diagnosis not present

## 2023-09-16 DIAGNOSIS — N401 Enlarged prostate with lower urinary tract symptoms: Secondary | ICD-10-CM | POA: Diagnosis not present

## 2023-09-16 DIAGNOSIS — J449 Chronic obstructive pulmonary disease, unspecified: Secondary | ICD-10-CM | POA: Diagnosis not present

## 2023-09-16 DIAGNOSIS — I110 Hypertensive heart disease with heart failure: Secondary | ICD-10-CM | POA: Diagnosis not present

## 2023-09-16 DIAGNOSIS — I5032 Chronic diastolic (congestive) heart failure: Secondary | ICD-10-CM | POA: Diagnosis not present

## 2023-09-16 DIAGNOSIS — I10 Essential (primary) hypertension: Secondary | ICD-10-CM | POA: Diagnosis not present

## 2023-09-20 DIAGNOSIS — F332 Major depressive disorder, recurrent severe without psychotic features: Secondary | ICD-10-CM | POA: Diagnosis not present

## 2023-09-23 DIAGNOSIS — F314 Bipolar disorder, current episode depressed, severe, without psychotic features: Secondary | ICD-10-CM | POA: Diagnosis not present

## 2023-09-27 DIAGNOSIS — J449 Chronic obstructive pulmonary disease, unspecified: Secondary | ICD-10-CM | POA: Diagnosis not present

## 2023-09-27 DIAGNOSIS — F332 Major depressive disorder, recurrent severe without psychotic features: Secondary | ICD-10-CM | POA: Diagnosis not present

## 2023-09-27 DIAGNOSIS — I110 Hypertensive heart disease with heart failure: Secondary | ICD-10-CM | POA: Diagnosis not present

## 2023-09-27 DIAGNOSIS — I5032 Chronic diastolic (congestive) heart failure: Secondary | ICD-10-CM | POA: Diagnosis not present

## 2023-09-27 DIAGNOSIS — I10 Essential (primary) hypertension: Secondary | ICD-10-CM | POA: Diagnosis not present

## 2023-10-05 DIAGNOSIS — Z0000 Encounter for general adult medical examination without abnormal findings: Secondary | ICD-10-CM | POA: Diagnosis not present

## 2023-10-05 DIAGNOSIS — N401 Enlarged prostate with lower urinary tract symptoms: Secondary | ICD-10-CM | POA: Diagnosis not present

## 2023-10-05 DIAGNOSIS — F3181 Bipolar II disorder: Secondary | ICD-10-CM | POA: Diagnosis not present

## 2023-10-05 DIAGNOSIS — I10 Essential (primary) hypertension: Secondary | ICD-10-CM | POA: Diagnosis not present

## 2023-10-05 DIAGNOSIS — F411 Generalized anxiety disorder: Secondary | ICD-10-CM | POA: Diagnosis not present

## 2023-10-11 DIAGNOSIS — F332 Major depressive disorder, recurrent severe without psychotic features: Secondary | ICD-10-CM | POA: Diagnosis not present

## 2023-10-13 DIAGNOSIS — K5900 Constipation, unspecified: Secondary | ICD-10-CM | POA: Diagnosis not present

## 2023-10-13 DIAGNOSIS — Z86018 Personal history of other benign neoplasm: Secondary | ICD-10-CM | POA: Diagnosis not present

## 2023-10-13 DIAGNOSIS — R1110 Vomiting, unspecified: Secondary | ICD-10-CM | POA: Diagnosis not present

## 2023-10-17 DIAGNOSIS — J449 Chronic obstructive pulmonary disease, unspecified: Secondary | ICD-10-CM | POA: Diagnosis not present

## 2023-10-17 DIAGNOSIS — I110 Hypertensive heart disease with heart failure: Secondary | ICD-10-CM | POA: Diagnosis not present

## 2023-10-17 DIAGNOSIS — I10 Essential (primary) hypertension: Secondary | ICD-10-CM | POA: Diagnosis not present

## 2023-10-17 DIAGNOSIS — N401 Enlarged prostate with lower urinary tract symptoms: Secondary | ICD-10-CM | POA: Diagnosis not present

## 2023-10-17 DIAGNOSIS — I5032 Chronic diastolic (congestive) heart failure: Secondary | ICD-10-CM | POA: Diagnosis not present

## 2023-10-20 DIAGNOSIS — I110 Hypertensive heart disease with heart failure: Secondary | ICD-10-CM | POA: Diagnosis not present

## 2023-10-20 DIAGNOSIS — E669 Obesity, unspecified: Secondary | ICD-10-CM | POA: Diagnosis not present

## 2023-10-20 DIAGNOSIS — E66813 Obesity, class 3: Secondary | ICD-10-CM | POA: Diagnosis not present

## 2023-10-20 DIAGNOSIS — K760 Fatty (change of) liver, not elsewhere classified: Secondary | ICD-10-CM | POA: Diagnosis not present

## 2023-10-20 DIAGNOSIS — Z6834 Body mass index (BMI) 34.0-34.9, adult: Secondary | ICD-10-CM | POA: Diagnosis not present

## 2023-10-20 DIAGNOSIS — E861 Hypovolemia: Secondary | ICD-10-CM | POA: Diagnosis not present

## 2023-10-20 DIAGNOSIS — I5032 Chronic diastolic (congestive) heart failure: Secondary | ICD-10-CM | POA: Diagnosis not present

## 2023-10-20 DIAGNOSIS — I10 Essential (primary) hypertension: Secondary | ICD-10-CM | POA: Diagnosis not present

## 2023-10-22 DIAGNOSIS — F1011 Alcohol abuse, in remission: Secondary | ICD-10-CM | POA: Diagnosis not present

## 2023-10-22 DIAGNOSIS — F314 Bipolar disorder, current episode depressed, severe, without psychotic features: Secondary | ICD-10-CM | POA: Diagnosis not present

## 2023-10-22 DIAGNOSIS — F419 Anxiety disorder, unspecified: Secondary | ICD-10-CM | POA: Diagnosis not present

## 2023-10-25 DIAGNOSIS — F332 Major depressive disorder, recurrent severe without psychotic features: Secondary | ICD-10-CM | POA: Diagnosis not present

## 2023-10-26 DIAGNOSIS — R3912 Poor urinary stream: Secondary | ICD-10-CM | POA: Diagnosis not present

## 2023-10-26 DIAGNOSIS — R972 Elevated prostate specific antigen [PSA]: Secondary | ICD-10-CM | POA: Diagnosis not present

## 2023-10-26 DIAGNOSIS — N401 Enlarged prostate with lower urinary tract symptoms: Secondary | ICD-10-CM | POA: Diagnosis not present

## 2023-10-27 ENCOUNTER — Other Ambulatory Visit: Payer: Self-pay

## 2023-10-27 DIAGNOSIS — J449 Chronic obstructive pulmonary disease, unspecified: Secondary | ICD-10-CM | POA: Diagnosis not present

## 2023-10-27 DIAGNOSIS — I5032 Chronic diastolic (congestive) heart failure: Secondary | ICD-10-CM | POA: Diagnosis not present

## 2023-10-27 DIAGNOSIS — I110 Hypertensive heart disease with heart failure: Secondary | ICD-10-CM | POA: Diagnosis not present

## 2023-10-27 DIAGNOSIS — I10 Essential (primary) hypertension: Secondary | ICD-10-CM | POA: Diagnosis not present

## 2023-10-27 DIAGNOSIS — R972 Elevated prostate specific antigen [PSA]: Secondary | ICD-10-CM

## 2023-10-28 DIAGNOSIS — K5730 Diverticulosis of large intestine without perforation or abscess without bleeding: Secondary | ICD-10-CM | POA: Diagnosis not present

## 2023-10-28 DIAGNOSIS — K648 Other hemorrhoids: Secondary | ICD-10-CM | POA: Diagnosis not present

## 2023-10-28 DIAGNOSIS — Z09 Encounter for follow-up examination after completed treatment for conditions other than malignant neoplasm: Secondary | ICD-10-CM | POA: Diagnosis not present

## 2023-10-28 DIAGNOSIS — D122 Benign neoplasm of ascending colon: Secondary | ICD-10-CM | POA: Diagnosis not present

## 2023-10-28 DIAGNOSIS — D124 Benign neoplasm of descending colon: Secondary | ICD-10-CM | POA: Diagnosis not present

## 2023-10-28 DIAGNOSIS — Z860101 Personal history of adenomatous and serrated colon polyps: Secondary | ICD-10-CM | POA: Diagnosis not present

## 2023-10-28 DIAGNOSIS — D123 Benign neoplasm of transverse colon: Secondary | ICD-10-CM | POA: Diagnosis not present

## 2023-11-02 DIAGNOSIS — D123 Benign neoplasm of transverse colon: Secondary | ICD-10-CM | POA: Diagnosis not present

## 2023-11-02 DIAGNOSIS — D124 Benign neoplasm of descending colon: Secondary | ICD-10-CM | POA: Diagnosis not present

## 2023-11-02 DIAGNOSIS — D122 Benign neoplasm of ascending colon: Secondary | ICD-10-CM | POA: Diagnosis not present

## 2023-11-02 DIAGNOSIS — F314 Bipolar disorder, current episode depressed, severe, without psychotic features: Secondary | ICD-10-CM | POA: Diagnosis not present

## 2023-11-08 DIAGNOSIS — F332 Major depressive disorder, recurrent severe without psychotic features: Secondary | ICD-10-CM | POA: Diagnosis not present

## 2023-11-09 DIAGNOSIS — H8112 Benign paroxysmal vertigo, left ear: Secondary | ICD-10-CM | POA: Diagnosis not present

## 2023-11-12 DIAGNOSIS — F314 Bipolar disorder, current episode depressed, severe, without psychotic features: Secondary | ICD-10-CM | POA: Diagnosis not present

## 2023-11-16 DIAGNOSIS — I5032 Chronic diastolic (congestive) heart failure: Secondary | ICD-10-CM | POA: Diagnosis not present

## 2023-11-16 DIAGNOSIS — I10 Essential (primary) hypertension: Secondary | ICD-10-CM | POA: Diagnosis not present

## 2023-11-16 DIAGNOSIS — J449 Chronic obstructive pulmonary disease, unspecified: Secondary | ICD-10-CM | POA: Diagnosis not present

## 2023-11-16 DIAGNOSIS — I110 Hypertensive heart disease with heart failure: Secondary | ICD-10-CM | POA: Diagnosis not present

## 2023-11-16 DIAGNOSIS — N401 Enlarged prostate with lower urinary tract symptoms: Secondary | ICD-10-CM | POA: Diagnosis not present

## 2023-11-17 DIAGNOSIS — F314 Bipolar disorder, current episode depressed, severe, without psychotic features: Secondary | ICD-10-CM | POA: Diagnosis not present

## 2023-11-18 DIAGNOSIS — F332 Major depressive disorder, recurrent severe without psychotic features: Secondary | ICD-10-CM | POA: Diagnosis not present

## 2023-11-22 ENCOUNTER — Ambulatory Visit
Admission: RE | Admit: 2023-11-22 | Discharge: 2023-11-22 | Disposition: A | Discharge status: Home / Self Care | Source: Ambulatory Visit

## 2023-11-22 DIAGNOSIS — R972 Elevated prostate specific antigen [PSA]: Secondary | ICD-10-CM

## 2023-11-22 DIAGNOSIS — F332 Major depressive disorder, recurrent severe without psychotic features: Secondary | ICD-10-CM | POA: Diagnosis not present

## 2023-11-22 MED ORDER — GADOPICLENOL 0.5 MMOL/ML IV SOLN
10.0000 mL | Freq: Once | INTRAVENOUS | Status: AC | PRN
  Administered 2023-11-22: 10 mL via INTRAVENOUS

## 2023-11-26 DIAGNOSIS — I5032 Chronic diastolic (congestive) heart failure: Secondary | ICD-10-CM | POA: Diagnosis not present

## 2023-11-26 DIAGNOSIS — I110 Hypertensive heart disease with heart failure: Secondary | ICD-10-CM | POA: Diagnosis not present

## 2023-11-26 DIAGNOSIS — I10 Essential (primary) hypertension: Secondary | ICD-10-CM | POA: Diagnosis not present

## 2023-11-26 DIAGNOSIS — J449 Chronic obstructive pulmonary disease, unspecified: Secondary | ICD-10-CM | POA: Diagnosis not present

## 2023-11-29 DIAGNOSIS — F332 Major depressive disorder, recurrent severe without psychotic features: Secondary | ICD-10-CM | POA: Diagnosis not present

## 2023-11-30 DIAGNOSIS — H8112 Benign paroxysmal vertigo, left ear: Secondary | ICD-10-CM | POA: Diagnosis not present

## 2023-12-06 DIAGNOSIS — F332 Major depressive disorder, recurrent severe without psychotic features: Secondary | ICD-10-CM | POA: Diagnosis not present

## 2023-12-07 DIAGNOSIS — H8110 Benign paroxysmal vertigo, unspecified ear: Secondary | ICD-10-CM | POA: Diagnosis not present

## 2023-12-07 DIAGNOSIS — F314 Bipolar disorder, current episode depressed, severe, without psychotic features: Secondary | ICD-10-CM | POA: Diagnosis not present

## 2023-12-13 DIAGNOSIS — F332 Major depressive disorder, recurrent severe without psychotic features: Secondary | ICD-10-CM | POA: Diagnosis not present

## 2023-12-17 DIAGNOSIS — I10 Essential (primary) hypertension: Secondary | ICD-10-CM | POA: Diagnosis not present

## 2023-12-17 DIAGNOSIS — N401 Enlarged prostate with lower urinary tract symptoms: Secondary | ICD-10-CM | POA: Diagnosis not present

## 2023-12-17 DIAGNOSIS — J449 Chronic obstructive pulmonary disease, unspecified: Secondary | ICD-10-CM | POA: Diagnosis not present

## 2023-12-17 DIAGNOSIS — I5032 Chronic diastolic (congestive) heart failure: Secondary | ICD-10-CM | POA: Diagnosis not present

## 2023-12-17 DIAGNOSIS — H8110 Benign paroxysmal vertigo, unspecified ear: Secondary | ICD-10-CM | POA: Diagnosis not present

## 2023-12-17 DIAGNOSIS — I110 Hypertensive heart disease with heart failure: Secondary | ICD-10-CM | POA: Diagnosis not present

## 2023-12-20 DIAGNOSIS — F332 Major depressive disorder, recurrent severe without psychotic features: Secondary | ICD-10-CM | POA: Diagnosis not present

## 2023-12-21 DIAGNOSIS — F314 Bipolar disorder, current episode depressed, severe, without psychotic features: Secondary | ICD-10-CM | POA: Diagnosis not present

## 2023-12-21 DIAGNOSIS — F1011 Alcohol abuse, in remission: Secondary | ICD-10-CM | POA: Diagnosis not present

## 2023-12-23 DIAGNOSIS — R42 Dizziness and giddiness: Secondary | ICD-10-CM | POA: Diagnosis not present

## 2023-12-23 DIAGNOSIS — J449 Chronic obstructive pulmonary disease, unspecified: Secondary | ICD-10-CM | POA: Diagnosis not present

## 2023-12-23 DIAGNOSIS — I10 Essential (primary) hypertension: Secondary | ICD-10-CM | POA: Diagnosis not present

## 2023-12-23 DIAGNOSIS — K219 Gastro-esophageal reflux disease without esophagitis: Secondary | ICD-10-CM | POA: Diagnosis not present

## 2023-12-26 DIAGNOSIS — J449 Chronic obstructive pulmonary disease, unspecified: Secondary | ICD-10-CM | POA: Diagnosis not present

## 2023-12-26 DIAGNOSIS — I110 Hypertensive heart disease with heart failure: Secondary | ICD-10-CM | POA: Diagnosis not present

## 2023-12-26 DIAGNOSIS — I10 Essential (primary) hypertension: Secondary | ICD-10-CM | POA: Diagnosis not present

## 2023-12-26 DIAGNOSIS — I5032 Chronic diastolic (congestive) heart failure: Secondary | ICD-10-CM | POA: Diagnosis not present

## 2023-12-27 DIAGNOSIS — F332 Major depressive disorder, recurrent severe without psychotic features: Secondary | ICD-10-CM | POA: Diagnosis not present

## 2023-12-28 DIAGNOSIS — H8110 Benign paroxysmal vertigo, unspecified ear: Secondary | ICD-10-CM | POA: Diagnosis not present

## 2023-12-30 DIAGNOSIS — H90A31 Mixed conductive and sensorineural hearing loss, unilateral, right ear with restricted hearing on the contralateral side: Secondary | ICD-10-CM | POA: Diagnosis not present

## 2023-12-30 DIAGNOSIS — H9313 Tinnitus, bilateral: Secondary | ICD-10-CM | POA: Diagnosis not present

## 2023-12-30 DIAGNOSIS — H903 Sensorineural hearing loss, bilateral: Secondary | ICD-10-CM | POA: Diagnosis not present

## 2024-01-03 DIAGNOSIS — F332 Major depressive disorder, recurrent severe without psychotic features: Secondary | ICD-10-CM | POA: Diagnosis not present

## 2024-01-05 DIAGNOSIS — F332 Major depressive disorder, recurrent severe without psychotic features: Secondary | ICD-10-CM | POA: Diagnosis not present

## 2024-01-10 DIAGNOSIS — F332 Major depressive disorder, recurrent severe without psychotic features: Secondary | ICD-10-CM | POA: Diagnosis not present

## 2024-01-16 DIAGNOSIS — J449 Chronic obstructive pulmonary disease, unspecified: Secondary | ICD-10-CM | POA: Diagnosis not present

## 2024-01-16 DIAGNOSIS — I110 Hypertensive heart disease with heart failure: Secondary | ICD-10-CM | POA: Diagnosis not present

## 2024-01-16 DIAGNOSIS — N401 Enlarged prostate with lower urinary tract symptoms: Secondary | ICD-10-CM | POA: Diagnosis not present

## 2024-01-16 DIAGNOSIS — I10 Essential (primary) hypertension: Secondary | ICD-10-CM | POA: Diagnosis not present

## 2024-01-16 DIAGNOSIS — I5032 Chronic diastolic (congestive) heart failure: Secondary | ICD-10-CM | POA: Diagnosis not present

## 2024-01-17 DIAGNOSIS — F332 Major depressive disorder, recurrent severe without psychotic features: Secondary | ICD-10-CM | POA: Diagnosis not present

## 2024-01-20 DIAGNOSIS — F314 Bipolar disorder, current episode depressed, severe, without psychotic features: Secondary | ICD-10-CM | POA: Diagnosis not present

## 2024-01-20 DIAGNOSIS — F419 Anxiety disorder, unspecified: Secondary | ICD-10-CM | POA: Diagnosis not present

## 2024-01-26 DIAGNOSIS — F332 Major depressive disorder, recurrent severe without psychotic features: Secondary | ICD-10-CM | POA: Diagnosis not present

## 2024-01-31 DIAGNOSIS — F332 Major depressive disorder, recurrent severe without psychotic features: Secondary | ICD-10-CM | POA: Diagnosis not present

## 2024-02-03 DIAGNOSIS — F314 Bipolar disorder, current episode depressed, severe, without psychotic features: Secondary | ICD-10-CM | POA: Diagnosis not present
